# Patient Record
Sex: Male | Born: 1945 | Race: White | Hispanic: No | Marital: Married | Smoking: Former smoker
Health system: Southern US, Community
[De-identification: ages and names within clinical notes are randomized; demographics above are authoritative.]

## PROBLEM LIST (undated history)

## (undated) DIAGNOSIS — G4733 Obstructive sleep apnea (adult) (pediatric): Secondary | ICD-10-CM

## (undated) DIAGNOSIS — I4729 Other ventricular tachycardia: Secondary | ICD-10-CM

## (undated) DIAGNOSIS — I639 Cerebral infarction, unspecified: Secondary | ICD-10-CM

## (undated) DIAGNOSIS — L409 Psoriasis, unspecified: Secondary | ICD-10-CM

## (undated) DIAGNOSIS — Z7901 Long term (current) use of anticoagulants: Secondary | ICD-10-CM

## (undated) DIAGNOSIS — F32A Depression, unspecified: Secondary | ICD-10-CM

## (undated) DIAGNOSIS — I5030 Unspecified diastolic (congestive) heart failure: Secondary | ICD-10-CM

## (undated) DIAGNOSIS — G2581 Restless legs syndrome: Secondary | ICD-10-CM

## (undated) DIAGNOSIS — Z9889 Other specified postprocedural states: Secondary | ICD-10-CM

## (undated) DIAGNOSIS — F4310 Post-traumatic stress disorder, unspecified: Secondary | ICD-10-CM

## (undated) DIAGNOSIS — R112 Nausea with vomiting, unspecified: Secondary | ICD-10-CM

## (undated) DIAGNOSIS — I472 Ventricular tachycardia, unspecified: Secondary | ICD-10-CM

## (undated) DIAGNOSIS — L4050 Arthropathic psoriasis, unspecified: Secondary | ICD-10-CM

## (undated) DIAGNOSIS — F419 Anxiety disorder, unspecified: Secondary | ICD-10-CM

## (undated) DIAGNOSIS — I2510 Atherosclerotic heart disease of native coronary artery without angina pectoris: Secondary | ICD-10-CM

## (undated) DIAGNOSIS — F329 Major depressive disorder, single episode, unspecified: Secondary | ICD-10-CM

## (undated) DIAGNOSIS — I10 Essential (primary) hypertension: Secondary | ICD-10-CM

## (undated) DIAGNOSIS — Z87442 Personal history of urinary calculi: Secondary | ICD-10-CM

## (undated) DIAGNOSIS — E785 Hyperlipidemia, unspecified: Secondary | ICD-10-CM

## (undated) DIAGNOSIS — K219 Gastro-esophageal reflux disease without esophagitis: Secondary | ICD-10-CM

## (undated) DIAGNOSIS — M1990 Unspecified osteoarthritis, unspecified site: Secondary | ICD-10-CM

## (undated) DIAGNOSIS — J309 Allergic rhinitis, unspecified: Secondary | ICD-10-CM

## (undated) DIAGNOSIS — E119 Type 2 diabetes mellitus without complications: Secondary | ICD-10-CM

## (undated) DIAGNOSIS — I4819 Other persistent atrial fibrillation: Secondary | ICD-10-CM

## (undated) DIAGNOSIS — R42 Dizziness and giddiness: Secondary | ICD-10-CM

## (undated) HISTORY — DX: Dizziness and giddiness: R42

## (undated) HISTORY — PX: CARDIAC ELECTROPHYSIOLOGY STUDY AND ABLATION: SHX1294

## (undated) HISTORY — DX: Cerebral infarction, unspecified: I63.9

## (undated) HISTORY — DX: Other specified postprocedural states: Z98.890

## (undated) HISTORY — PX: OTHER SURGICAL HISTORY: SHX169

## (undated) HISTORY — DX: Allergic rhinitis, unspecified: J30.9

## (undated) HISTORY — PX: TRANSTHORACIC ECHOCARDIOGRAM: SHX275

## (undated) HISTORY — PX: CARDIAC CATHETERIZATION: SHX172

## (undated) SURGERY — Surgical Case
Anesthesia: *Unknown

---

## 2003-04-03 ENCOUNTER — Emergency Department (HOSPITAL_COMMUNITY): Admission: AC | Admit: 2003-04-03 | Discharge: 2003-04-03 | Payer: Self-pay

## 2003-11-07 ENCOUNTER — Inpatient Hospital Stay (HOSPITAL_COMMUNITY): Admission: EM | Admit: 2003-11-07 | Discharge: 2003-11-08 | Payer: Self-pay | Admitting: Emergency Medicine

## 2004-03-28 ENCOUNTER — Ambulatory Visit: Payer: Self-pay | Admitting: Family Medicine

## 2004-03-30 ENCOUNTER — Ambulatory Visit: Payer: Self-pay | Admitting: Family Medicine

## 2004-04-25 ENCOUNTER — Ambulatory Visit: Payer: Self-pay | Admitting: Family Medicine

## 2004-06-26 ENCOUNTER — Ambulatory Visit: Payer: Self-pay | Admitting: Family Medicine

## 2004-06-28 ENCOUNTER — Ambulatory Visit: Payer: Self-pay | Admitting: Family Medicine

## 2004-09-07 ENCOUNTER — Ambulatory Visit: Payer: Self-pay | Admitting: Family Medicine

## 2004-09-11 ENCOUNTER — Ambulatory Visit: Payer: Self-pay | Admitting: Family Medicine

## 2004-09-12 ENCOUNTER — Ambulatory Visit: Payer: Self-pay

## 2004-09-18 ENCOUNTER — Ambulatory Visit: Payer: Self-pay | Admitting: Family Medicine

## 2004-09-21 ENCOUNTER — Ambulatory Visit: Payer: Self-pay

## 2004-10-06 ENCOUNTER — Ambulatory Visit: Payer: Self-pay | Admitting: Cardiology

## 2004-10-16 ENCOUNTER — Ambulatory Visit: Payer: Self-pay | Admitting: Cardiology

## 2004-10-16 ENCOUNTER — Inpatient Hospital Stay (HOSPITAL_COMMUNITY): Admission: AD | Admit: 2004-10-16 | Discharge: 2004-10-18 | Payer: Self-pay | Admitting: Cardiology

## 2004-11-21 ENCOUNTER — Ambulatory Visit: Payer: Self-pay | Admitting: Internal Medicine

## 2004-12-15 ENCOUNTER — Ambulatory Visit: Payer: Self-pay | Admitting: Family Medicine

## 2004-12-20 ENCOUNTER — Ambulatory Visit: Payer: Self-pay | Admitting: Family Medicine

## 2005-02-02 ENCOUNTER — Ambulatory Visit: Payer: Self-pay | Admitting: Family Medicine

## 2005-03-14 ENCOUNTER — Ambulatory Visit: Payer: Self-pay | Admitting: Family Medicine

## 2005-03-20 ENCOUNTER — Ambulatory Visit: Payer: Self-pay | Admitting: Family Medicine

## 2005-06-07 ENCOUNTER — Ambulatory Visit: Payer: Self-pay | Admitting: Family Medicine

## 2005-06-12 ENCOUNTER — Ambulatory Visit: Payer: Self-pay | Admitting: Family Medicine

## 2005-06-12 ENCOUNTER — Ambulatory Visit: Payer: Self-pay | Admitting: Internal Medicine

## 2005-09-10 ENCOUNTER — Ambulatory Visit: Payer: Self-pay | Admitting: Family Medicine

## 2005-09-12 ENCOUNTER — Ambulatory Visit: Payer: Self-pay | Admitting: Family Medicine

## 2006-03-06 ENCOUNTER — Ambulatory Visit: Payer: Self-pay | Admitting: Family Medicine

## 2006-03-08 ENCOUNTER — Ambulatory Visit: Payer: Self-pay | Admitting: Family Medicine

## 2006-03-20 ENCOUNTER — Ambulatory Visit: Payer: Self-pay | Admitting: Family Medicine

## 2006-04-01 ENCOUNTER — Ambulatory Visit: Payer: Self-pay | Admitting: Family Medicine

## 2006-04-03 ENCOUNTER — Ambulatory Visit: Payer: Self-pay | Admitting: Family Medicine

## 2006-04-05 ENCOUNTER — Ambulatory Visit: Payer: Self-pay | Admitting: Family Medicine

## 2006-04-08 ENCOUNTER — Ambulatory Visit: Payer: Self-pay | Admitting: Family Medicine

## 2006-04-10 ENCOUNTER — Ambulatory Visit: Payer: Self-pay | Admitting: Family Medicine

## 2006-04-12 ENCOUNTER — Ambulatory Visit: Payer: Self-pay | Admitting: Family Medicine

## 2006-07-08 ENCOUNTER — Ambulatory Visit: Payer: Self-pay | Admitting: Internal Medicine

## 2006-07-15 ENCOUNTER — Ambulatory Visit: Payer: Self-pay

## 2006-09-09 ENCOUNTER — Encounter (INDEPENDENT_AMBULATORY_CARE_PROVIDER_SITE_OTHER): Payer: Self-pay | Admitting: *Deleted

## 2007-03-21 ENCOUNTER — Ambulatory Visit: Payer: Self-pay | Admitting: Family Medicine

## 2008-02-19 ENCOUNTER — Ambulatory Visit: Payer: Self-pay | Admitting: Internal Medicine

## 2008-11-16 ENCOUNTER — Encounter: Payer: Self-pay | Admitting: Family Medicine

## 2008-11-16 LAB — HM DIABETES EYE EXAM: HM Diabetic Eye Exam: NORMAL

## 2009-03-09 ENCOUNTER — Telehealth: Payer: Self-pay | Admitting: Internal Medicine

## 2009-04-11 ENCOUNTER — Ambulatory Visit: Payer: Self-pay | Admitting: Internal Medicine

## 2009-04-11 DIAGNOSIS — I4892 Unspecified atrial flutter: Secondary | ICD-10-CM | POA: Insufficient documentation

## 2009-04-11 DIAGNOSIS — I472 Ventricular tachycardia: Secondary | ICD-10-CM | POA: Insufficient documentation

## 2009-04-11 DIAGNOSIS — I10 Essential (primary) hypertension: Secondary | ICD-10-CM | POA: Insufficient documentation

## 2010-05-02 NOTE — Assessment & Plan Note (Signed)
Summary: F1Y/AMD      Allergies Added: NKDA  Visit Type:  Follow-up  CC:  no complaints.  History of Present Illness: [name] returns today for followup.  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  He has morbid obesity and has lost 10 lbs. in the past year.  The patient denies c/p, sob, palpitations or syncope.  Problems Prior to Update: 1)  Hypertension, Benign  (ICD-401.1)  Current Problems (verified): 1)  Hypertension, Benign  (ICD-401.1)  Current Medications (verified): 1)  Prozac 20 Mg  Caps (Fluoxetine Hcl) .... Take 1 Capsule By Mouth Once A Day 2)  Metoprolol Tartrate 50 Mg Tabs (Metoprolol Tartrate) .... Take One Tablet By Mouth Twice A Day 3)  Prilosec 20 Mg  Cpdr (Omeprazole) .... Take 1 Tablet By Mouth Every Morning 4)  Zyrtec Allergy 10 Mg  Tabs (Cetirizine Hcl) .... Take 1 Tab By Mouth At Bedtime 5)  Azithromycin 250 Mg  Tabs (Azithromycin) .... 2 Tabs By Mouth X 1day, Then1 Tab Daily 6)  Cheratussin Ac 100-10 Mg/26ml  Syrp (Guaifenesin-Codeine) .Marland Kitchen.. 1-2 Tsp By Mouth Q Hs As Needed Cough  Allergies (verified): No Known Drug Allergies  Review of Systems  The patient denies chest pain, syncope, dyspnea on exertion, and peripheral edema.    Vital Signs:  Patient profile:   65 year old male Weight:      274.75 pounds Pulse rate:   58 / minute Pulse rhythm:   regular BP sitting:   138 / 80  (left arm) Cuff size:   large  Vitals Entered By: Mercer Pod (April 11, 2009 10:43 AM)  Physical Exam  General:  Obese, well developed, well nourished, in no acute distress.  HEENT: normal Neck: supple. No JVD. Carotids 2+ bilaterally no bruits Cor: IRIRR no rubs, gallops or murmur Lungs: CTA Ab: soft, nontender. nondistended. No HSM. Good bowel sounds Ext: warm. no cyanosis, clubbing or edema Neuro: alert and oriented. Grossly nonfocal. affect pleasant  Neck:  N   EKG  Procedure date:   04/11/2009  Findings:      Atrial flutter with a ventricular response rate of:  58.  Right axis deviation.    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 Eveleth and is asymptomatic.  In addition to his beta blocker, I have recommended ASA 325  mg daily as he has borderline HTN as his only CHADS risk factor. His updated medication list for this problem includes:    Metoprolol Tartrate 50 Mg Tabs (Metoprolol tartrate) .Marland Kitchen... 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 His updated medication list for this problem includes:    Metoprolol Tartrate 50 Mg Tabs (Metoprolol tartrate) .Marland Kitchen... Take one tablet by mouth twice a day  Problem # 3:  HYPERTENSION, BENIGN (ICD-401.1) The patient states that he does not have hypertension though his blood pressure is elevated today.  I have asked that he continue his current meds, maintain a low sodium diet and lose weight. His updated medication list for this problem includes:    Metoprolol Tartrate 50 Mg Tabs (Metoprolol tartrate) .Marland Kitchen... Take one tablet by mouth twice a day Prescriptions: METOPROLOL TARTRATE 50 MG TABS (METOPROLOL TARTRATE) Take one tablet by mouth twice a day  #180  x 3   Entered by:   Charlena Cross, RN, BSN   Authorized by:   Laren Boom, MD, [facility]   Signed by:   Charlena Cross, RN, BSN on 04/11/2009   Method used:   Electronically to        [facility] (retail)       [address], [address]       [place], [place]  [address]       Ph: [phone]       Fax: [phone]   RxID:   [id number]

## 2010-07-21 ENCOUNTER — Ambulatory Visit (INDEPENDENT_AMBULATORY_CARE_PROVIDER_SITE_OTHER): Payer: PRIVATE HEALTH INSURANCE | Admitting: Internal Medicine

## 2010-07-21 ENCOUNTER — Encounter: Payer: Self-pay | Admitting: Internal Medicine

## 2010-07-21 DIAGNOSIS — I472 Ventricular tachycardia: Secondary | ICD-10-CM

## 2010-07-21 DIAGNOSIS — I4892 Unspecified atrial flutter: Secondary | ICD-10-CM

## 2010-07-21 DIAGNOSIS — E669 Obesity, unspecified: Secondary | ICD-10-CM

## 2010-07-21 DIAGNOSIS — I10 Essential (primary) hypertension: Secondary | ICD-10-CM

## 2010-07-21 NOTE — Assessment & Plan Note (Signed)
His blood pressure is well controlled. Will follow. He has been instructed to lose weight.

## 2010-07-21 NOTE — Assessment & Plan Note (Signed)
He remains asymptomatic and will continue his beta blocker.

## 2010-07-21 NOTE — Assessment & Plan Note (Signed)
He is asymptomatic and his [place] score is one. He will continue his current meds.

## 2010-07-21 NOTE — Assessment & Plan Note (Signed)
I have continued to stress the importance of weight loss. He understands and will try to improve.

## 2010-07-21 NOTE — Patient Instructions (Signed)
No change in your medications today. Your physician recommends that you schedule a follow-up appointment in: 1 year

## 2010-07-21 NOTE — Progress Notes (Signed)
HPI [name] returns today for followup. He is a pleasant 65 yo man with a h/o RVOT VT which has been asymptomatic. He has a h/o atrial fib/flutter and has been on a strategy of rate control as he is asymptomatic. He continues to have no symptoms except for minimal dyspnea when he is walking up a grade. No c/p or peripheral edema. He continues to have trouble with weight loss and has gained over 5 lbs since we last saw him a year ago. No syncope. No Known Allergies   Current Outpatient Prescriptions  Medication Sig Dispense Refill  . aspirin EC 325 MG EC tablet Take 325 mg by mouth daily.        Marland Kitchen FLUoxetine (PROZAC) 20 MG tablet Take 20 mg by mouth daily.        Marland Kitchen loratadine (CLARITIN) 10 MG tablet Take 10 mg by mouth daily.        . metoprolol (LOPRESSOR) 50 MG tablet Take 25 mg by mouth 2 (two) times daily.       . Multiple Vitamins-Minerals (PRESERVISION AREDS 2) CAPS Take 2 capsules by mouth daily.        Marland Kitchen omeprazole (PRILOSEC) 20 MG capsule Take 20 mg by mouth daily.        Marland Kitchen azithromycin (ZITHROMAX) 250 MG tablet Take 2 tablets by mouth on day 1, followed by 1 tablet by mouth daily for 4 days.        . cetirizine (ZYRTEC) 10 MG tablet Take 10 mg by mouth daily.        Marland Kitchen guaiFENesin-codeine (ROBITUSSIN AC) 100-10 MG/5ML syrup Take 5 mLs by mouth 3 (three) times daily as needed.           No past medical history on file.  ROS:   All systems reviewed and negative except as noted in the HPI.   No past surgical history on file.   No family history on file.   History   Social History  . Marital Status: Married    Spouse Name: N/A    Number of Children: N/A  . Years of Education: N/A   Occupational History  . Not on file.   Social History Main Topics  . Smoking status: Never Smoker   . Smokeless tobacco: Never Used  . Alcohol Use: Yes     occasional  . Drug Use: No  . Sexually Active: Not on file   Other Topics Concern  . Not on file   Social History Narrative    . No narrative on file     BP 126/78  Pulse 66  Ht 5' 9.5" (1.765 m)  Wt 281 lb 1.9 oz (127.515 kg)  BMI 40.92 kg/m2  Physical Exam:  Obese, Well appearing NAD HEENT: Unremarkable Neck:  No JVD, no thyromegally Lymphatics:  No adenopathy Back:  No CVA tenderness Lungs:  Clear HEART: IRegular rate rhythm, no murmurs, no rubs, no clicks Abd:  Flat, positive bowel sounds, no organomegally, no rebound, no guarding Ext:  2 plus pulses, no edema, no cyanosis, no clubbing Skin:  No rashes no nodules Neuro:  CN II through XII intact, motor grossly intact  EKG Atrial fib/flutter with a controlled ventricular response.  Assess/Plan:

## 2010-08-15 NOTE — Assessment & Plan Note (Signed)
[facility]                         ELECTROPHYSIOLOGY OFFICE NOTE   MATAI, CARPENITO                        MRN:          [id number]  DATE:02/19/2008                            DOB:          11/22/1945    Mr. Czerniak returns today for followup.  He is a very pleasant middle-aged  male with a RV outflow tract VT and palpitations from this, whose  symptoms have been well controlled over the last year.  He denies chest  pain.  He denies shortness of breath.  He had no specific complaints  today.  He is interested in trying to decrease his dose of his beta-  blocker.   CURRENT MEDICATIONS:  1. Toprol-XL 50 twice a day.  2. Prozac 40 a day.  3. Prilosec 20 a day.  4. Zyrtec 10 a day.   PHYSICAL EXAMINATION:  GENERAL:  He is a pleasant middle-aged man in no  distress.  VITAL SIGNS:  Blood pressure 129/83, the pulse 62 and regular,  respirations were 18, the weight was 280 pounds.  NECK:  Revealed no jugular venous distention.  LUNGS:  Clear bilaterally to auscultation.  No wheezes, rales, or  rhonchi are present.  No increased work of breathing.  CARDIOVASCULAR:  Regular rate and rhythm.  Normal S1 and S2.  EXTREMITIES:  Demonstrate no edema.   EKG demonstrates sinus rhythm with normal axis and intervals.  He does  have first-degree AV block.   IMPRESSION:  1. Right ventricular outflow tract ventricular tachycardia.  2. Hypertension.   DISCUSSION:  Mr. Chilson is stable.  I have asked that he decrease his  dose of the long-acting Toprol to 25 mg twice a day as he would like to  stop the medicine altogether, but I am concerned about recurrent  tachycardia.  I want to see him back in a year, if he is stable and  symptom free, then we will decrease his dose of beta-blocker further as  his blood pressure allows.     Doylene Canning. Ladona Ridgel, MD  Electronically Signed    GWT/MedQ  DD: 02/19/2008  DT: 02/20/2008  Job #: [id number]   cc:   Arta Silence, MD

## 2010-08-18 NOTE — Assessment & Plan Note (Signed)
[facility]                         ELECTROPHYSIOLOGY OFFICE NOTE   [name], BABY                        MRN:          [id number]  DATE:07/08/2006                            DOB:          1946-03-03    [name] returns today for follow-up.  He is a very pleasant middle-  aged male with a history of RV outflow tract VT and nonobstructive  coronary artery disease, obesity, and borderline hypertension.  He  returns today for follow-up.  Overall, in the last year he has been  stable except for complaints of intermittent chest tightness, which has  been present for the last couple of months.  Sometimes this is  exertionally-related, sometimes not.  He denies any relationship to food  intake.  He has occasional dizzy spells, which we think is probably his  nonsustained VT, though he has denied palpitations.   PHYSICAL EXAMINATION:  GENERAL:  He is a pleasant, well-appearing, obese  man in no distress.  VITAL SIGNS:  Blood pressure 114/80, the pulse 55 and regular,  respirations 18.  The weight was 280 pounds.  NECK:  No jugular venous distention.  LUNGS:  Clear bilaterally to auscultation.  No wheezes, rales or  rhonchi.  CARDIOVASCULAR:  A regular rate and rhythm with normal S1 and S2.  The  heart sounds were distant.  EXTREMITIES:  No cyanosis, clubbing or edema.   The EKG demonstrates sinus bradycardia with first degree AV block and  left axis deviation.   IMPRESSION:  1. Nonobstructive coronary artery disease.  2. Chest pressure, perhaps worrisome for worsening coronary artery      disease.  3. History of right ventricular outflow tract ventricular tachycardia,      now controlled on beta blocker therapy   DISCUSSION:  Overall, [name] is fairly stable though I am a little  concerned about his chest discomfort, and I have recommended that he  undergo exercise treadmill testing for additional evaluation.  He will  continue on his beta  blocker, holding it the day before the procedure.     Doylene Canning. Ladona Ridgel, MD  Electronically Signed    GWT/MedQ  DD: 07/08/2006  DT: 07/09/2006  Job #: [id number]   cc:   Arta Silence, MD

## 2010-08-18 NOTE — Cardiovascular Report (Signed)
NAMEDRAE, MITZEL NO.:  [id number]   MEDICAL RECORD NO.:  [id number]          PATIENT TYPE:  INP   LOCATION:  6527                         FACILITY:  [facility]   PHYSICIAN:  Arvilla Meres, M.D. LHCDATE OF BIRTH:  10/24/1945   DATE OF PROCEDURE:  10/17/2004  DATE OF DISCHARGE:                              CARDIAC CATHETERIZATION   PRIMARY CARE PHYSICIAN:  Dr. Laurita Spence at [facility].   CARDIOLOGIST:  Dr. Angelina Spence.   PATIENT IDENTIFICATION:  [name] is a very pleasant 65 year old male with  a history of borderline diabetes who was evaluated for syncope.  Event  monitor showed nonsustained V-tach and he was referred for cardiac  catheterization to rule out underlying coronary artery disease.   PROCEDURES PERFORMED:  1.  Selective coronary angiography.  2.  Left heart catheterization.  3.  Left ventriculogram.   DESCRIPTION OF PROCEDURE:  The risks and benefits of catheterization were  explained to [name]; consent was signed and placed on the chart.  A 6-  [place] arterial sheath was placed in the right femoral artery using an  anterior puncture and a modified Seldinger technique.  Standard catheters  including a JL4, JR4 and an angled pigtail were used for the procedure and  all catheter exchanges were made over wire; there are no apparent  complications.  At the end of the procedure, the patient was transferred to  holding area in stable condition for removal of his sheath.   FINDINGS:  Central aortic pressure is 135/90 with a mean 110.  LV pressure  was 136/90 with an EDP of 19.  There was no gradient on aortic valve  pullback.   CORONARY ANATOMY:  Left main was short.  There was no angiographic CAD.   LAD was a long vessel that wrapped the apex.  It gave off a very large first  diagonal and a small second diagonal.  There was a tubular 40% to 50%  stenosis in the mid LAD after the first diagonal.  This did not appear to be  flow-limiting.   Left circumflex was a large codominant vessel.  It gave off a large OM-1 a  large OM-2.  The A-V groove circumflex was large and gave off a normal-size  PDA.  There was no angiographic CAD.   Right coronary artery was a large codominant vessel.  It gave off a normal-  sized PDA.  There was a 30% lesion in the distal RCA just prior to the  takeoff of the PDA.   Left ventriculogram done in the RAO approach showed an ejection fraction of  65% with no evidence of wall motion abnormalities or mitral regurgitation.  On panning down over the aorta after the ventriculogram, there was no  evidence of aneurysm dilatation or significant atherosclerotic plaquing.   ASSESSMENT:  1.  Nonobstructive coronary artery disease.  2.  Normal left ventricular function.   PLAN:  Will be to proceed with electrophysiology testing to further evaluate  his ventricular tachycardia.  He will also need risk factor modification,  given his underlying coronary disease and  diabetes.       DB/MEDQ  D:  10/17/2004  T:  10/17/2004  Job:  [id number]   cc:   [name], M.D.  [address] [place]  [place] [address]  Fax: [phone]

## 2010-08-18 NOTE — Discharge Summary (Signed)
NAMEALVIA, Adam NO.:  [id number]   MEDICAL RECORD NO.:  [id number]          PATIENT TYPE:  INP   LOCATION:  6532                         FACILITY:  [facility]   PHYSICIAN:  Maple Mirza, P.A. DATE OF BIRTH:  10/24/1945   DATE OF ADMISSION:  10/16/2004  DATE OF DISCHARGE:                                 DISCHARGE SUMMARY   ADDENDUM TO DICTATION [id number].   LABORATORY DATA:  October 18, 2004:  Fasting serum electrolytes:  Sodium 137,  potassium 3.8, chloride 107, carbonate 24, glucose is 114, BUN is 8,  creatinine 0.8.  His prior serum electrolytes were done at about 9:30 in the  evening on the day of admission.  They were a sodium of 139, potassium 3.5,  chloride of 108, carbonate 25, BUN is 12, creatinine 1, and glucose 169.  His 114 as a fasting July 19th shows a mild glucose intolerance.  This has  been communicated to the patient and he knows he is a borderline diabetic.  He also had a complete blood count July 19th and white cells were 7.7,  hemoglobin 13.1, hematocrit 36.8, platelets 276.  I would also like to say  that at the time of his catheterization on the post-cath orders, he was  started on Lipitor 40 mg daily at bedtime.  When this was brought up to the  patient, the patient queried this since his cholesterol values are such as  follows:  Cholesterol 152, triglycerides 188, HDL cholesterol 25, LDL  cholesterol 89.  I did mention to him that cholesterol has pleiotropic  effects such as stabilizing plaques, there is some plaque formation in his  coronary arteries found on catheterization, as well as control of  cholesterol.  His major aberrant number here is the HDL cholesterol of 25  for which a statin will not be the most effective medication.  [name]  promised to bring up dosing of statin when he sees Dr. Ladona Ridgel in two weeks  and he will see Dr. Ladona Ridgel on August 22nd at 9:45.       GM/MEDQ  D:  10/18/2004  T:  10/18/2004  Job:  [id number]   cc:   Doylene Canning. Ladona Ridgel, M.D.   Laurita Quint, M.D.  [address] [place]  [place] [address]  Fax: [phone]
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
## 2010-08-18 NOTE — H&P (Signed)
[name], PUSKAS NO.:  [id number]   MEDICAL RECORD NO.:  [id number]          PATIENT TYPE:  INP   LOCATION:  6527                         FACILITY:  [facility]   PHYSICIAN:  Learta Codding, M.D. LHCDATE OF BIRTH:  10/24/1945   DATE OF ADMISSION:  10/16/2004  DATE OF DISCHARGE:                                HISTORY & PHYSICAL   ADDENDUM:  The patient is a 65 year old male who was initially evaluated by Dr.  Antoine Poche on October 06, 2004, for syncope.  An event monitor was placed and the  patient was called in today after he was noted on October 12, 2004, to have an  episode of non-sustained ventricular tachycardia lasting approximately 20  seconds.  Review of the event monitor revealed that the heart rate  associated with ventricular tachycardia was around 250 beats per minute.  The patient did report marked dizziness at that time but no chest pain or  diaphoresis.  He states that he had several more episodes since he saw Dr.  Antoine Poche in the office.  He was called in from our office to be admitted for  further evaluation.  The patient has stable vital signs.  No chest pain,  shortness of breath, or diaphoresis.   The full H&P was dictated on October 06, 2004.  The only addendum to this is  that the patient's family history is notable for the fact there is a  significant history of sudden cardiac death in his family.  He stated his  father died of sudden cardiac death at the age of 44, although this could  have well been a myocardial infarction; however, the mother on the father's  side had a sudden cardiac death at age 72.  He also had an uncle on his  father's side who had sudden cardiac death at age 24.   A 12 lead electrocardiogram will be obtained and placed on the chart.  The  patient will be started on aspirin and a beta blocker in preparation for his  cardiac catheterization in addition tomorrow.  Dr. Antoine Poche apparently has  already notified the patient and  discussed the procedure.  Also, Dr. Ladona Ridgel  will see the patient after his cardiac catheterization pending an underlying  diagnosis of ischemic heart disease.   CURRENT PROBLEM LIST:  1.  Non-sustained ventricular tachycardia with syncope and dizziness.      1.  Rule out coronary artery disease/ischemic heart disease.      2.  Rule out idiopathic VT.      3.  Family history of sudden cardiac death.  2.  Hypertension and left ventricular hypertrophy by echocardiogram.  3.  Obesity.   PLAN:  1.  As outlined above, cardiac catheterization in the a.m., rule out      ischemic heart disease.  2.  EPS evaluation after cardiac catheterization with Dr. Ladona Ridgel.  3.  Aspirin and beta blocker tonight.  4.  Evaluate TSH.  5.  Lipid panel.  6.  12 lead electrocardiogram.       GED/MEDQ  D:  10/16/2004  T:  10/16/2004  Job:  [id number]   cc:   Rollene Rotunda, M.D.  [address]  [address]  [place]  [place] [address]   Laurita Quint, M.D.  [address] [place]  [place] [address]  Fax: [phone]

## 2010-08-18 NOTE — Assessment & Plan Note (Signed)
[facility]                                 ON-CALL NOTE   [name], [name]                          MRN:          [id number]  DATE:01/23/2008                            DOB:          [birth year]    PRIMARY CARDIOLOGIST:  Doylene Canning. Ladona Ridgel, MD   [name] is a 65 year old male with a history of atrial fibrillation.  He was last seen by Dr. Ladona Ridgel in April 2008.  He called requesting a  refill on his prescription.  His wife stated that he has an appointment  with Dr. Ladona Ridgel on February 17, 2008, which she promises me he will  keep.  She is requesting a refill on his metoprolol to get him through  to February 19, 2008.   I reiterated to her that we would not refill this medication again.  She  stated he would keep his appointment in November.  I called in a month's  worth of his metoprolol 50 mg b.i.d. to the [facility] phone [phone] and  also told them that we will not refill this medication without an office  visit.      Theodore Demark, PA-C  Electronically Signed      Jesse Sans. Daleen Squibb, MD, [facility]  Electronically Signed   RB/MedQ  DD: 01/23/2008  DT: 01/24/2008  Job #: [id number]
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
## 2010-09-07 ENCOUNTER — Other Ambulatory Visit: Payer: Self-pay | Admitting: Internal Medicine

## 2011-04-03 HISTORY — PX: CATARACT EXTRACTION W/ INTRAOCULAR LENS  IMPLANT, BILATERAL: SHX1307

## 2011-06-01 DIAGNOSIS — I5030 Unspecified diastolic (congestive) heart failure: Secondary | ICD-10-CM

## 2011-06-01 HISTORY — DX: Unspecified diastolic (congestive) heart failure: I50.30

## 2011-06-07 ENCOUNTER — Ambulatory Visit: Payer: Self-pay | Admitting: Internal Medicine

## 2011-06-08 ENCOUNTER — Other Ambulatory Visit: Payer: Self-pay

## 2011-06-08 ENCOUNTER — Encounter (HOSPITAL_COMMUNITY): Payer: Self-pay | Admitting: Emergency Medicine

## 2011-06-08 ENCOUNTER — Emergency Department (HOSPITAL_COMMUNITY): Payer: PRIVATE HEALTH INSURANCE

## 2011-06-08 ENCOUNTER — Inpatient Hospital Stay (HOSPITAL_COMMUNITY)
Admission: EM | Admit: 2011-06-08 | Discharge: 2011-06-12 | DRG: 292 | Disposition: A | Discharge status: Home / Self Care | Payer: PRIVATE HEALTH INSURANCE | Attending: Cardiovascular Disease | Admitting: Cardiovascular Disease

## 2011-06-08 DIAGNOSIS — I4891 Unspecified atrial fibrillation: Secondary | ICD-10-CM

## 2011-06-08 DIAGNOSIS — I2510 Atherosclerotic heart disease of native coronary artery without angina pectoris: Secondary | ICD-10-CM | POA: Diagnosis present

## 2011-06-08 DIAGNOSIS — I472 Ventricular tachycardia, unspecified: Secondary | ICD-10-CM | POA: Insufficient documentation

## 2011-06-08 DIAGNOSIS — R0609 Other forms of dyspnea: Secondary | ICD-10-CM | POA: Diagnosis present

## 2011-06-08 DIAGNOSIS — R079 Chest pain, unspecified: Secondary | ICD-10-CM

## 2011-06-08 DIAGNOSIS — I4892 Unspecified atrial flutter: Secondary | ICD-10-CM | POA: Diagnosis present

## 2011-06-08 DIAGNOSIS — I5031 Acute diastolic (congestive) heart failure: Principal | ICD-10-CM | POA: Diagnosis present

## 2011-06-08 DIAGNOSIS — I10 Essential (primary) hypertension: Secondary | ICD-10-CM | POA: Insufficient documentation

## 2011-06-08 DIAGNOSIS — I498 Other specified cardiac arrhythmias: Secondary | ICD-10-CM | POA: Diagnosis present

## 2011-06-08 DIAGNOSIS — Z79899 Other long term (current) drug therapy: Secondary | ICD-10-CM

## 2011-06-08 DIAGNOSIS — I5032 Chronic diastolic (congestive) heart failure: Secondary | ICD-10-CM | POA: Insufficient documentation

## 2011-06-08 DIAGNOSIS — D649 Anemia, unspecified: Secondary | ICD-10-CM | POA: Diagnosis present

## 2011-06-08 DIAGNOSIS — I480 Paroxysmal atrial fibrillation: Secondary | ICD-10-CM | POA: Insufficient documentation

## 2011-06-08 DIAGNOSIS — R0989 Other specified symptoms and signs involving the circulatory and respiratory systems: Secondary | ICD-10-CM | POA: Diagnosis present

## 2011-06-08 DIAGNOSIS — Z7982 Long term (current) use of aspirin: Secondary | ICD-10-CM

## 2011-06-08 DIAGNOSIS — Z6841 Body Mass Index (BMI) 40.0 and over, adult: Secondary | ICD-10-CM

## 2011-06-08 DIAGNOSIS — E876 Hypokalemia: Secondary | ICD-10-CM | POA: Diagnosis present

## 2011-06-08 DIAGNOSIS — I499 Cardiac arrhythmia, unspecified: Secondary | ICD-10-CM | POA: Insufficient documentation

## 2011-06-08 DIAGNOSIS — I509 Heart failure, unspecified: Secondary | ICD-10-CM | POA: Diagnosis present

## 2011-06-08 DIAGNOSIS — R0602 Shortness of breath: Secondary | ICD-10-CM

## 2011-06-08 HISTORY — DX: Atherosclerotic heart disease of native coronary artery without angina pectoris: I25.10

## 2011-06-08 HISTORY — DX: Ventricular tachycardia: I47.2

## 2011-06-08 HISTORY — DX: Unspecified diastolic (congestive) heart failure: I50.30

## 2011-06-08 HISTORY — DX: Essential (primary) hypertension: I10

## 2011-06-08 HISTORY — DX: Gastro-esophageal reflux disease without esophagitis: K21.9

## 2011-06-08 HISTORY — DX: Ventricular tachycardia, unspecified: I47.20

## 2011-06-08 HISTORY — DX: Other ventricular tachycardia: I47.29

## 2011-06-08 LAB — PROTIME-INR
INR: 1.33 (ref 0.00–1.49)
Prothrombin Time: 16.7 seconds — ABNORMAL HIGH (ref 11.6–15.2)

## 2011-06-08 LAB — CBC
HCT: 34.1 % — ABNORMAL LOW (ref 39.0–52.0)
Hemoglobin: 11.4 g/dL — ABNORMAL LOW (ref 13.0–17.0)
MCH: 28.8 pg (ref 26.0–34.0)
MCHC: 33.4 g/dL (ref 30.0–36.0)
MCV: 86.1 fL (ref 78.0–100.0)
Platelets: 239 10*3/uL (ref 150–400)
RBC: 3.96 MIL/uL — ABNORMAL LOW (ref 4.22–5.81)
RDW: 13.9 % (ref 11.5–15.5)
WBC: 6.6 10*3/uL (ref 4.0–10.5)

## 2011-06-08 LAB — BASIC METABOLIC PANEL
BUN: 9 mg/dL (ref 6–23)
CO2: 25 mEq/L (ref 19–32)
Calcium: 8.6 mg/dL (ref 8.4–10.5)
Chloride: 103 mEq/L (ref 96–112)
Creatinine, Ser: 0.64 mg/dL (ref 0.50–1.35)
GFR calc Af Amer: >90 mL/min (ref >90)
GFR calc non Af Amer: >90 mL/min (ref >90)
Glucose, Bld: 175 mg/dL — ABNORMAL HIGH (ref 70–99)
Potassium: 3.4 mEq/L — ABNORMAL LOW (ref 3.5–5.1)
Sodium: 140 mEq/L (ref 135–145)

## 2011-06-08 LAB — CARDIAC PANEL(CRET KIN+CKTOT+MB+TROPI)
CK, MB: 5.9 ng/mL — ABNORMAL HIGH (ref 0.3–4.0)
CK, MB: 5.1 ng/mL — ABNORMAL HIGH (ref 0.3–4.0)
Relative Index: 2.7 — ABNORMAL HIGH (ref 0.0–2.5)
Relative Index: 2.3 (ref 0.0–2.5)
Total CK: 216 U/L (ref 7–232)
Total CK: 220 U/L (ref 7–232)
Troponin I: <0.3 ng/mL (ref <0.30)
Troponin I: <0.3 ng/mL (ref <0.30)

## 2011-06-08 LAB — PRO B NATRIURETIC PEPTIDE: Pro B Natriuretic peptide (BNP): 789.9 pg/mL — ABNORMAL HIGH (ref 0–125)

## 2011-06-08 LAB — HEMOGLOBIN A1C: Hgb A1c MFr Bld: 7 % — ABNORMAL HIGH (ref <5.7)

## 2011-06-08 LAB — TSH: TSH: 1.944 u[IU]/mL (ref 0.350–4.500)

## 2011-06-08 LAB — MAGNESIUM: Magnesium: 1.6 mg/dL (ref 1.5–2.5)

## 2011-06-08 MED ORDER — POTASSIUM CHLORIDE CRYS ER 20 MEQ PO TBCR
20.0000 meq | EXTENDED_RELEASE_TABLET | Freq: Two times a day (BID) | ORAL | Status: DC
  Administered 2011-06-08: 20 meq via ORAL
  Filled 2011-06-08 (×3): qty 1

## 2011-06-08 MED ORDER — SODIUM CHLORIDE 0.9 % IJ SOLN: 3.0000 mL | INTRAMUSCULAR | Status: DC | PRN

## 2011-06-08 MED ORDER — ONDANSETRON HCL 4 MG/2ML IJ SOLN: 4.0000 mg | Freq: Four times a day (QID) | INTRAMUSCULAR | Status: DC | PRN

## 2011-06-08 MED ORDER — SODIUM CHLORIDE 0.9 % IV SOLN: 250.0000 mL | INTRAVENOUS | Status: DC | PRN

## 2011-06-08 MED ORDER — PATIENT'S GUIDE TO USING COUMADIN BOOK
Freq: Once | Status: AC
  Administered 2011-06-08: 15:00:00
  Filled 2011-06-08: qty 1

## 2011-06-08 MED ORDER — FUROSEMIDE 10 MG/ML IJ SOLN
40.0000 mg | Freq: Once | INTRAMUSCULAR | Status: AC
  Administered 2011-06-08: 40 mg via INTRAVENOUS
  Filled 2011-06-08: qty 4

## 2011-06-08 MED ORDER — FUROSEMIDE 10 MG/ML IJ SOLN
80.0000 mg | Freq: Two times a day (BID) | INTRAMUSCULAR | Status: DC
  Administered 2011-06-08 – 2011-06-12 (×8): 80 mg via INTRAVENOUS
  Filled 2011-06-08 (×10): qty 8

## 2011-06-08 MED ORDER — METOPROLOL TARTRATE 25 MG PO TABS
25.0000 mg | ORAL_TABLET | Freq: Two times a day (BID) | ORAL | Status: DC
  Administered 2011-06-08 – 2011-06-12 (×8): 25 mg via ORAL
  Filled 2011-06-08 (×9): qty 1

## 2011-06-08 MED ORDER — ENOXAPARIN SODIUM 150 MG/ML ~~LOC~~ SOLN
140.0000 mg | Freq: Two times a day (BID) | SUBCUTANEOUS | Status: DC
  Administered 2011-06-08 – 2011-06-09 (×2): 140 mg via SUBCUTANEOUS
  Filled 2011-06-08 (×4): qty 1

## 2011-06-08 MED ORDER — GUAIFENESIN-CODEINE 100-10 MG/5ML PO SYRP: 5.0000 mL | ORAL_SOLUTION | Freq: Three times a day (TID) | ORAL | Status: DC | PRN

## 2011-06-08 MED ORDER — PANTOPRAZOLE SODIUM 40 MG PO TBEC
40.0000 mg | DELAYED_RELEASE_TABLET | Freq: Every day | ORAL | Status: DC
  Administered 2011-06-09 – 2011-06-12 (×4): 40 mg via ORAL
  Filled 2011-06-08 (×4): qty 1

## 2011-06-08 MED ORDER — FLUOXETINE HCL 20 MG PO CAPS
20.0000 mg | ORAL_CAPSULE | Freq: Every day | ORAL | Status: DC
  Administered 2011-06-09 – 2011-06-12 (×4): 20 mg via ORAL
  Filled 2011-06-08 (×4): qty 1

## 2011-06-08 MED ORDER — NITROGLYCERIN 0.4 MG SL SUBL: 0.4000 mg | SUBLINGUAL_TABLET | SUBLINGUAL | Status: DC | PRN

## 2011-06-08 MED ORDER — HYDROCODONE-ACETAMINOPHEN 5-325 MG PO TABS
1.0000 | ORAL_TABLET | Freq: Four times a day (QID) | ORAL | Status: DC | PRN
  Administered 2011-06-08 – 2011-06-10 (×2): 1 via ORAL
  Filled 2011-06-08 (×2): qty 1

## 2011-06-08 MED ORDER — WARFARIN - PHARMACIST DOSING INPATIENT: Freq: Every day | Status: DC

## 2011-06-08 MED ORDER — FLUOXETINE HCL 20 MG PO TABS: 20.0000 mg | ORAL_TABLET | Freq: Every day | ORAL | Status: DC

## 2011-06-08 MED ORDER — GUAIFENESIN-CODEINE 100-10 MG/5ML PO SOLN: 5.0000 mL | Freq: Three times a day (TID) | ORAL | Status: DC | PRN

## 2011-06-08 MED ORDER — WARFARIN VIDEO: Freq: Once | Status: DC

## 2011-06-08 MED ORDER — ACETAMINOPHEN 325 MG PO TABS: 650.0000 mg | ORAL_TABLET | ORAL | Status: DC | PRN

## 2011-06-08 MED ORDER — ASPIRIN 81 MG PO CHEW
324.0000 mg | CHEWABLE_TABLET | Freq: Once | ORAL | Status: AC
  Administered 2011-06-08: 324 mg via ORAL
  Filled 2011-06-08: qty 4

## 2011-06-08 MED ORDER — WARFARIN SODIUM 7.5 MG PO TABS
7.5000 mg | ORAL_TABLET | Freq: Once | ORAL | Status: AC
  Administered 2011-06-08: 7.5 mg via ORAL
  Filled 2011-06-08: qty 1

## 2011-06-08 MED ORDER — INSULIN ASPART 100 UNIT/ML ~~LOC~~ SOLN
0.0000 [IU] | Freq: Three times a day (TID) | SUBCUTANEOUS | Status: DC
  Filled 2011-06-08: qty 3

## 2011-06-08 MED ORDER — ZOLPIDEM TARTRATE 5 MG PO TABS: 5.0000 mg | ORAL_TABLET | Freq: Every evening | ORAL | Status: DC | PRN

## 2011-06-08 MED ORDER — SODIUM CHLORIDE 0.9 % IJ SOLN
3.0000 mL | Freq: Two times a day (BID) | INTRAMUSCULAR | Status: DC
  Administered 2011-06-08 – 2011-06-12 (×9): 3 mL via INTRAVENOUS
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
## 2011-06-08 NOTE — ED Notes (Signed)
Pt undressed and in gown. Caridac monitor, pulse oximetry, and blood pressure cuff on.

## 2011-06-08 NOTE — ED Notes (Signed)
Pt c/o midsternal chest tightness starting last night with SOB and inability to sleep laying flat x 7 days; pt sts tightness is constant
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
## 2011-06-08 NOTE — ED Provider Notes (Signed)
History     CSN: [id number]  Arrival date & time 06/08/11  5621   First MD Initiated Contact with Patient 06/08/11 [phone]      Chief Complaint  Patient presents with  . Chest Pain  . Shortness of Breath    (Consider location/radiation/quality/duration/timing/severity/associated sxs/prior treatment) HPI Patient presents to the emergency department with chest tightness and shortness of breath started last night.  Patient states that in the last 7 days he said increasing shortness of breath especially with exertion and laying flat.  Patient was seen by his primary doctor yesterday who set up an outpatient echocardiogram for today but since the patient developed chest tightness he felt he needed to be seen in the emergency department.  Patient denies abdominal pain, fever, weakness, or back pain.  States has noticed increased swelling in his lower abdomen scrotum and lower legs.  Patient states he was given 40 of Lasix yesterday and noticed that he had a 2 pound weight loss since that time. Past Medical History  Diagnosis Date  . Arrhythmia   . Obesity     History reviewed. No pertinent past surgical history.  History reviewed. No pertinent family history.  History  Substance Use Topics  . Smoking status: Never Smoker   . Smokeless tobacco: Never Used  . Alcohol Use: Yes     occasional      Review of Systems All pertinent positives and negatives reviewed in the history of present illness  Allergies  Review of patient's allergies indicates no known allergies.  Home Medications   Current Outpatient Rx  Name Route Sig Dispense Refill  . ASPIRIN EC 325 MG PO TBEC Oral Take 325 mg by mouth daily.      Marland Kitchen CETIRIZINE HCL 10 MG PO TABS Oral Take 10 mg by mouth daily.      Marland Kitchen FLUOXETINE HCL 20 MG PO TABS Oral Take 20 mg by mouth daily.      . FUROSEMIDE 40 MG PO TABS Oral Take 40 mg by mouth daily.    . GUAIFENESIN-CODEINE 100-10 MG/5ML PO SYRP Oral Take 5 mLs by mouth 3 (three)  times daily as needed. For cough    . METOPROLOL TARTRATE 25 MG PO TABS Oral Take 25 mg by mouth 2 (two) times daily.    Marland Kitchen PRESERVISION AREDS 2 PO CAPS Oral Take 2 capsules by mouth daily.      Marland Kitchen OMEPRAZOLE 20 MG PO CPDR Oral Take 20 mg by mouth daily.      Marland Kitchen POTASSIUM CHLORIDE CRYS ER 20 MEQ PO TBCR Oral Take 40 mEq by mouth daily.      BP 136/71  Pulse 68  Resp 16  SpO2 95%  Physical Exam  Constitutional: He is oriented to person, place, and time. He appears well-developed and well-nourished.  HENT:  Head: Normocephalic and atraumatic.  Eyes: Pupils are equal, round, and reactive to light.  Cardiovascular: Normal rate and regular rhythm.  Exam reveals no gallop and no friction rub.   No murmur heard. Pulmonary/Chest: Effort normal. No accessory muscle usage. No apnea, not tachypneic and not bradypneic. No respiratory distress. He has decreased breath sounds in the right lower field and the left lower field.  Genitourinary:       Patient has scrotal swelling noted on exam  Musculoskeletal:       Patient has 2+ pitting edema of his lower extremities.  Neurological: He is alert and oriented to person, place, and time.  Skin: Skin is warm  and dry. No rash noted.    ED Course  Procedures (including critical care time)  Labs Reviewed  CARDIAC PANEL(CRET KIN+CKTOT+MB+TROPI) - Abnormal; Notable for the following:    CK, MB 5.9 (*)    Relative Index 2.7 (*)    All other components within normal limits  CBC - Abnormal; Notable for the following:    RBC 3.96 (*)    Hemoglobin 11.4 (*)    HCT 34.1 (*)    All other components within normal limits  BASIC METABOLIC PANEL - Abnormal; Notable for the following:    Potassium 3.4 (*)    Glucose, Bld 175 (*)    All other components within normal limits  PRO B NATRIURETIC PEPTIDE - Abnormal; Notable for the following:    Pro B Natriuretic peptide (BNP) 789.9 (*)    All other components within normal limits   Dg Chest [facility] 1  View  06/08/2011  *RADIOLOGY REPORT*  Clinical Data: Chest tightness and shortness of breath.  Wheezing and cough.  History of atrial fibrillation.  PORTABLE CHEST - 1 VIEW  Comparison: 10/16/2004  Findings: Mildly degraded exam due to AP portable technique and patient body habitus.  Numerous leads and wires project over the chest.  Midline trachea. Moderate cardiomegaly.  Apparent right paratracheal soft tissue fullness is likely due to technique. No pleural effusion or pneumothorax.  No congestive failure.  Mild interstitial prominence is felt to be technique related.  The right lung is clear.  Suboptimal evaluation of the left lung base secondary to overlying soft tissues.  IMPRESSION: Cardiomegaly without congestive failure or acute disease.  Some limited exam, secondary patient body habitus and AP portable technique.  Original Report Authenticated By: Consuello Bossier, M.D.    I spoke with [facility] cardiology who will be done to see the patient for admission.  The patient has been stable while here in the emergency room.  He was given nitroglycerin sublingually along with aspirin.  He was also given 40 of Lasix.     MDM    Date: 06/08/2011  Rate: 73  Rhythm: atrial fibrillation  QRS Axis: right  Intervals: normal  ST/T Wave abnormalities: normal  Conduction Disutrbances:none  Narrative Interpretation: A-fib noted today  Old EKG Reviewed: changes noted   MDM Reviewed: vitals, nursing note and previous chart Reviewed previous: labs and ECG Interpretation: labs, ECG and x-ray Consults: cardiology         Carlyle Dolly, PA-C 06/08/11 1057

## 2011-06-08 NOTE — ED Notes (Signed)
Pt denies pain at this time but says that he got a little short of breath walking to and from the bathroom.

## 2011-06-08 NOTE — Progress Notes (Signed)
Patient refused insulin and said that he has never taken insulin in his life and he will not start now. Wants explanation from MD.

## 2011-06-08 NOTE — ED Notes (Signed)
Attempt to call report, floor unable to take at this time

## 2011-06-08 NOTE — ED Notes (Signed)
Attempted to call report again, on hold for 7 mins waiting

## 2011-06-08 NOTE — Progress Notes (Signed)
Pt. c/o headache, stated headache prn med was not strong enough. Called provider on-call Lucile Crater, NP) to advise of info, and she placed new order in.

## 2011-06-08 NOTE — ED Provider Notes (Signed)
Medical screening examination/treatment/procedure(s) were conducted as a shared visit with non-physician practitioner(s) and myself.  I personally evaluated the patient during the encounter Patient with increasing shortness of breath consistent with heart failure. He has no prior history of heart failure. He was given Lasix 40 mg by cardiology yesterday and was scheduled for an echo today however due to persistent shortness of breath he was seen here. He has signs of fluid overload on exam and by labs. Patient given IV Lasix and cardiology coming to admit.  Gwyneth Sprout, MD 06/08/11 1505

## 2011-06-09 ENCOUNTER — Other Ambulatory Visit: Payer: Self-pay

## 2011-06-09 DIAGNOSIS — I517 Cardiomegaly: Secondary | ICD-10-CM

## 2011-06-09 DIAGNOSIS — I509 Heart failure, unspecified: Secondary | ICD-10-CM

## 2011-06-09 LAB — CBC
MCH: 28.6 pg (ref 26.0–34.0)
MCHC: 33.1 g/dL (ref 30.0–36.0)
MCV: 86.2 fL (ref 78.0–100.0)
Platelets: 239 10*3/uL (ref 150–400)
RBC: 3.99 MIL/uL — ABNORMAL LOW (ref 4.22–5.81)
RDW: 13.9 % (ref 11.5–15.5)

## 2011-06-09 LAB — BASIC METABOLIC PANEL
CO2: 30 mEq/L (ref 19–32)
Calcium: 8.4 mg/dL (ref 8.4–10.5)
Creatinine, Ser: 0.67 mg/dL (ref 0.50–1.35)
GFR calc non Af Amer: >90 mL/min (ref >90)

## 2011-06-09 LAB — CARDIAC PANEL(CRET KIN+CKTOT+MB+TROPI)
Relative Index: 1.3 (ref 0.0–2.5)
Total CK: 258 U/L — ABNORMAL HIGH (ref 7–232)
Troponin I: <0.3 ng/mL (ref <0.30)

## 2011-06-09 LAB — PROTIME-INR: Prothrombin Time: 16.4 seconds — ABNORMAL HIGH (ref 11.6–15.2)

## 2011-06-09 LAB — GLUCOSE, CAPILLARY
Glucose-Capillary: 126 mg/dL — ABNORMAL HIGH (ref 70–99)
Glucose-Capillary: 113 mg/dL — ABNORMAL HIGH (ref 70–99)
Glucose-Capillary: 120 mg/dL — ABNORMAL HIGH (ref 70–99)

## 2011-06-09 LAB — LIPID PANEL
HDL: 24 mg/dL — ABNORMAL LOW (ref >39)
LDL Cholesterol: 72 mg/dL (ref 0–99)

## 2011-06-09 MED ORDER — ENOXAPARIN SODIUM 150 MG/ML ~~LOC~~ SOLN
130.0000 mg | Freq: Two times a day (BID) | SUBCUTANEOUS | Status: DC
  Administered 2011-06-09: 130 mg via SUBCUTANEOUS
  Administered 2011-06-10: 18:00:00 via SUBCUTANEOUS
  Administered 2011-06-10 – 2011-06-12 (×4): 130 mg via SUBCUTANEOUS
  Filled 2011-06-09 (×8): qty 1

## 2011-06-09 MED ORDER — POTASSIUM CHLORIDE CRYS ER 20 MEQ PO TBCR
40.0000 meq | EXTENDED_RELEASE_TABLET | Freq: Two times a day (BID) | ORAL | Status: DC
  Administered 2011-06-09 – 2011-06-12 (×7): 40 meq via ORAL
  Filled 2011-06-09 (×8): qty 2

## 2011-06-09 MED ORDER — WARFARIN SODIUM 7.5 MG PO TABS
7.5000 mg | ORAL_TABLET | Freq: Once | ORAL | Status: AC
  Administered 2011-06-09: 7.5 mg via ORAL
  Filled 2011-06-09: qty 1

## 2011-06-09 NOTE — Progress Notes (Signed)
Patient ID: [name], male   DOB: 02-15-1946, 66 y.o.   MRN: [id number] Subjective:  Dyspnea improved. Edema improved.  Objective:  Vital Signs in the last 24 hours: Temp:  [98.5 F (36.9 C)-98.8 F (37.1 C)] 98.6 F (37 C) (03/09 0500) Pulse Rate:  [68-108] 80  (03/09 0500) Resp:  [16-24] 18  (03/09 0500) BP: (114-140)/(66-85) 114/66 mmHg (03/09 0500) SpO2:  [87 %-97 %] 90 % (03/09 0500) Weight:  [135.762 kg (299 lb 4.8 oz)-140.615 kg (310 lb)] 135.762 kg (299 lb 4.8 oz) (03/09 0500)  Intake/Output from previous day: 03/08 0701 - 03/09 0700 In: 731 [P.O.:720; I.V.:3; IV Piggyback:8] Out: 3725 [Urine:3725] Intake/Output from this shift:    Physical Exam: Well appearing morbidly obese, NAD HEENT: Unremarkable Neck:  7 cm JVD, no thyromegally Lungs:  Clear except for rales in bases. HEART:  IRegular rate rhythm, no murmurs, no rubs, no clicks Abd:  Obese, positive bowel sounds, no organomegally, no rebound, no guarding Ext:  2 plus pulses, 1+ edema, no cyanosis, no clubbing Skin:  No rashes no nodules Neuro:  CN II through XII intact, motor grossly intact  Lab Results:  Basename 06/09/11 0311 06/08/11 0901  WBC 6.8 6.6  HGB 11.4* 11.4*  PLT 239 239    Basename 06/09/11 0311 06/08/11 0901  NA 139 140  K 3.1* 3.4*  CL 98 103  CO2 30 25  GLUCOSE 140* 175*  BUN 10 9  CREATININE 0.67 0.64    Basename 06/09/11 0305 06/08/11 2108  TROPONINI <0.30 <0.30   Hepatic Function Panel No results found for this basename: PROT,ALBUMIN,AST,ALT,ALKPHOS,BILITOT,BILIDIR,IBILI in the last 72 hours  Basename 06/09/11 0311  CHOL 117   No results found for this basename: PROTIME in the last 72 hours  Imaging: Dg Chest Port 1 View  06/08/2011  *RADIOLOGY REPORT*  Clinical Data: Chest tightness and shortness of breath.  Wheezing and cough.  History of atrial fibrillation.  PORTABLE CHEST - 1 VIEW  Comparison: 10/16/2004  Findings: Mildly degraded exam due to AP portable technique  and patient body habitus.  Numerous leads and wires project over the chest.  Midline trachea. Moderate cardiomegaly.  Apparent right paratracheal soft tissue fullness is likely due to technique. No pleural effusion or pneumothorax.  No congestive failure.  Mild interstitial prominence is felt to be technique related.  The right lung is clear.  Suboptimal evaluation of the left lung base secondary to overlying soft tissues.  IMPRESSION: Cardiomegaly without congestive failure or acute disease.  Some limited exam, secondary patient body habitus and AP portable technique.  Original Report Authenticated By: Consuello Bossier, M.D.    Cardiac Studies: Tele - atrial fib with up to 3 sec pauses while awake. Assessment/Plan:  1. Acute diastolic CHF - Continue IV lasix. 2. Atrial fib with a CVR - he has had asymptomatic pauses. Will follow. 3. Hypokalemia - replete. 4. Disp. - expect he will require an additional 48 hours of inpatient diuresis.   LOS: 1 day    [name] 06/09/2011, 8:10 AM
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
## 2011-06-09 NOTE — Progress Notes (Signed)
  Echocardiogram 2D Echocardiogram has been performed.  [name] L 06/09/2011, 2:04 PM

## 2011-06-09 NOTE — Progress Notes (Signed)
Dr. Ladona Ridgel made aware on rounds of 3.4 sec pause. Pt. Alert and oriented x 3. Asymptomatic.  Blood work ordered.

## 2011-06-10 ENCOUNTER — Other Ambulatory Visit: Payer: Self-pay

## 2011-06-10 LAB — GLUCOSE, CAPILLARY
Glucose-Capillary: 133 mg/dL — ABNORMAL HIGH (ref 70–99)
Glucose-Capillary: 112 mg/dL — ABNORMAL HIGH (ref 70–99)

## 2011-06-10 LAB — BASIC METABOLIC PANEL
BUN: 9 mg/dL (ref 6–23)
CO2: 33 mEq/L — ABNORMAL HIGH (ref 19–32)
Calcium: 8.4 mg/dL (ref 8.4–10.5)
Chloride: 96 mEq/L (ref 96–112)
Chloride: 94 mEq/L — ABNORMAL LOW (ref 96–112)
Creatinine, Ser: 0.77 mg/dL (ref 0.50–1.35)
GFR calc Af Amer: >90 mL/min (ref >90)
GFR calc non Af Amer: >90 mL/min (ref >90)
Glucose, Bld: 150 mg/dL — ABNORMAL HIGH (ref 70–99)

## 2011-06-10 LAB — PROTIME-INR
INR: 1.2 (ref 0.00–1.49)
Prothrombin Time: 15.5 seconds — ABNORMAL HIGH (ref 11.6–15.2)

## 2011-06-10 MED ORDER — POTASSIUM CHLORIDE CRYS ER 20 MEQ PO TBCR
40.0000 meq | EXTENDED_RELEASE_TABLET | Freq: Once | ORAL | Status: AC
  Administered 2011-06-10: 40 meq via ORAL
  Filled 2011-06-10: qty 2

## 2011-06-10 MED ORDER — WARFARIN SODIUM 10 MG PO TABS
10.0000 mg | ORAL_TABLET | Freq: Once | ORAL | Status: AC
  Administered 2011-06-10: 10 mg via ORAL
  Filled 2011-06-10: qty 1
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
## 2011-06-10 NOTE — Progress Notes (Signed)
Nurse reports patient is complaining of muscle cramping. Was found to be hypokalemic on am labs yesterday. No follow-up labs done this am or potassium replacement although he is on 80 mg of lasix BID. Will given him 40 mEq of potassium X 2. Have BMET drawn now. Repeat BMET in am. Daily potassium replacement.  Joni Reining NP

## 2011-06-10 NOTE — Progress Notes (Signed)
Patient ID: [name], male   DOB: July 27, 1945, 66 y.o.   MRN: [id number] Subjective:  Dyspnea improved. Edema improved.  Objective:  Vital Signs in the last 24 hours: Temp:  [98.3 F (36.8 C)-99.1 F (37.3 C)] 99.1 F (37.3 C) (03/10 0539) Pulse Rate:  [71-84] 77  (03/10 0539) Resp:  [19-20] 19  (03/10 0539) BP: (106-135)/(58-86) 123/75 mmHg (03/10 0539) SpO2:  [95 %-99 %] 98 % (03/10 0539) Weight:  [132.813 kg (292 lb 12.8 oz)] 132.813 kg (292 lb 12.8 oz) (03/10 0539)  Intake/Output from previous day: 03/09 0701 - 03/10 0700 In: 1660 [P.O.:1660] Out: 4600 [Urine:4600] Intake/Output from this shift: Total I/O In: 240 [P.O.:240] Out: 200 [Urine:200]  Physical Exam: Well appearing morbidly obese, NAD HEENT: Unremarkable Neck:  6 cm JVD, no thyromegally Lungs:  Clear except for rales in bases. HEART:  IRegular rate rhythm, no murmurs, no rubs, no clicks Abd:  Obese, positive bowel sounds, no organomegally, no rebound, no guarding Ext:  2 plus pulses, 1+ edema, no cyanosis, no clubbing Skin:  No rashes no nodules Neuro:  CN II through XII intact, motor grossly intact  Lab Results:  Basename 06/09/11 0311 06/08/11 0901  WBC 6.8 6.6  HGB 11.4* 11.4*  PLT 239 239    Basename 06/09/11 2354 06/09/11 0311  NA 140 139  K 2.9* 3.1*  CL 96 98  CO2 33* 30  GLUCOSE 150* 140*  BUN 9 10  CREATININE 0.66 0.67    Basename 06/09/11 0305 06/08/11 2108  TROPONINI <0.30 <0.30   Hepatic Function Panel No results found for this basename: PROT,ALBUMIN,AST,ALT,ALKPHOS,BILITOT,BILIDIR,IBILI in the last 72 hours  Basename 06/09/11 0311  CHOL 117   No results found for this basename: PROTIME in the last 72 hours  Imaging: Dg Chest Port 1 View  06/08/2011  *RADIOLOGY REPORT*  Clinical Data: Chest tightness and shortness of breath.  Wheezing and cough.  History of atrial fibrillation.  PORTABLE CHEST - 1 VIEW  Comparison: 10/16/2004  Findings: Mildly degraded exam due to AP portable  technique and patient body habitus.  Numerous leads and wires project over the chest.  Midline trachea. Moderate cardiomegaly.  Apparent right paratracheal soft tissue fullness is likely due to technique. No pleural effusion or pneumothorax.  No congestive failure.  Mild interstitial prominence is felt to be technique related.  The right lung is clear.  Suboptimal evaluation of the left lung base secondary to overlying soft tissues.  IMPRESSION: Cardiomegaly without congestive failure or acute disease.  Some limited exam, secondary patient body habitus and AP portable technique.  Original Report Authenticated By: Consuello Bossier, M.D.    Cardiac Studies: Tele - atrial fib with up to 3 sec pauses while awake. Assessment/Plan:  1. Acute diastolic CHF - Continue IV lasix. 2. Atrial fib with a CVR - he has had asymptomatic pauses. Will follow. 3. Hypokalemia - replete. 4. Disp. - expect he will require an additional 24 hours of inpatient diuresis.   LOS: 2 days    [name] 06/10/2011, 9:11 AM

## 2011-06-11 DIAGNOSIS — I4891 Unspecified atrial fibrillation: Secondary | ICD-10-CM

## 2011-06-11 LAB — BASIC METABOLIC PANEL
CO2: 32 mEq/L (ref 19–32)
Calcium: 8.8 mg/dL (ref 8.4–10.5)
Glucose, Bld: 134 mg/dL — ABNORMAL HIGH (ref 70–99)
Sodium: 138 mEq/L (ref 135–145)

## 2011-06-11 LAB — GLUCOSE, CAPILLARY: Glucose-Capillary: 107 mg/dL — ABNORMAL HIGH (ref 70–99)

## 2011-06-11 MED ORDER — WARFARIN SODIUM 10 MG PO TABS
10.0000 mg | ORAL_TABLET | Freq: Once | ORAL | Status: AC
Start: 2011-06-11 — End: 2011-06-11
  Administered 2011-06-11: 10 mg via ORAL
  Filled 2011-06-11: qty 1

## 2011-06-11 NOTE — Progress Notes (Signed)
Patient ID: [name], male   DOB: Mar 28, 1946, 66 y.o.   MRN: [id number] Subjective:  Doing well,  SOB is improving,  Continues to have significant BLE edema.  Objective:  Vital Signs in the last 24 hours: Temp:  [98.6 F (37 C)-99.4 F (37.4 C)] 99.1 F (37.3 C) (03/11 0538) Pulse Rate:  [82-92] 92  (03/11 0538) Resp:  [19-20] 19  (03/11 0538) BP: (113-146)/(77-90) 145/90 mmHg (03/11 0538) SpO2:  [92 %-96 %] 94 % (03/11 0538) Weight:  [288 lb 12.8 oz (130.999 kg)] 288 lb 12.8 oz (130.999 kg) (03/11 0538)  Intake/Output from previous day: 03/10 0701 - 03/11 0700 In: 1292 [P.O.:1260; IV Piggyback:32] Out: 5050 [Urine:5050] Intake/Output from this shift:    Physical Exam: Well appearing morbidly obese, NAD HEENT: OP clear Neck:  6 cm JVD, no thyromegally Lungs:  Clear . HEART:  IRegular rate rhythm, no murmurs, no rubs, no clicks Abd:  Obese, positive bowel sounds, no organomegally, no rebound, no guarding Ext:  2 plus pulses, 1+ edema, no cyanosis, no clubbing Skin:  No rashes no nodules Neuro:  CN II through XII intact, motor grossly intact  Lab Results:  Basename 06/09/11 0311 06/08/11 0901  WBC 6.8 6.6  HGB 11.4* 11.4*  PLT 239 239    Basename 06/11/11 0558 06/10/11 1643  NA 138 138  K 3.8 3.4*  CL 96 94*  CO2 32 33*  GLUCOSE 134* 126*  BUN 10 9  CREATININE 0.69 0.77    Basename 06/09/11 0305 06/08/11 2108  TROPONINI <0.30 <0.30   Hepatic Function Panel No results found for this basename: PROT,ALBUMIN,AST,ALT,ALKPHOS,BILITOT,BILIDIR,IBILI in the last 72 hours  Basename 06/09/11 0311  CHOL 117   No results found for this basename: PROTIME in the last 72 hours  Imaging: No results found.  Cardiac Studies: Tele - atrial fib  Assessment/Plan:  1. Acute diastolic CHF - Continue IV lasix x 1 more day 2. Atrial fib- stable rates, continue coumadin (Goal INR 2-3) 3. Hypokalemia - replete. 4. Disp. - expect he will require an additional 24 hours of  inpatient diuresis.     LOS: 3 days    Hillis Range 06/11/2011, 7:43 AM

## 2011-06-11 NOTE — Progress Notes (Signed)
Patient heart rate dropped to 24. 2.78 sec pause.  Patient asymptomatic. B/p 120/82.  Tiffany RN notified.

## 2011-06-11 NOTE — Progress Notes (Signed)
ANTICOAGULATION CONSULT NOTE - Follow Up Consult  Pharmacy Consult for coumadin Indication: atrial fibrillation  Vital Signs: Temp: 99.1 F (37.3 C) (03/11 0538) Temp src: Oral (03/11 0538) BP: 145/90 mmHg (03/11 0538) Pulse Rate: 92  (03/11 0538)  Labs:  Alvira Philips 06/11/11 0558 06/10/11 1643 06/10/11 1058 06/09/11 2354 06/09/11 0311 06/09/11 0305 06/08/11 2108 06/08/11 1548  HGB -- -- -- -- 11.4* -- -- --  HCT -- -- -- -- 34.4* -- -- --  PLT -- -- -- -- 239 -- -- --  APTT -- -- -- -- -- -- -- --  LABPROT 15.5* -- 15.5* 15.9* -- -- -- --  INR 1.20 -- 1.20 1.24 -- -- -- --  HEPARINUNFRC -- -- -- -- -- -- -- --  CREATININE 0.69 0.77 -- 0.66 -- -- -- --  CKTOTAL -- -- -- -- -- 261* 258* 220  CKMB -- -- -- -- -- 3.5 4.0 5.1*  TROPONINI -- -- -- -- -- <0.30 <0.30 <0.30   Estimated Creatinine Clearance: 126.9 ml/min (by C-G formula based on Cr of 0.69).  Assessment: 38 yom with hx of afib on aspirin 325mg  daily PTA for stroke prevention. Now starting on coumadin with lovenox bridge. INR has not increased much after 3 dose of coumadin. No bleeding or other problems noted. Lovenox dose is appropriate.   Goal of Therapy:  INR 2-3   Plan:  1. Repeat coumadin 10mg  PO x 1 tonight - may need to increase if still no response tomorrow 2. Continue lovenxo 130mg  SQ Q12H 3. F/u AM INR   Treyton Slimp, Drake Leach 06/11/2011,11:05 AM

## 2011-06-11 NOTE — Plan of Care (Signed)
Problem: Phase I Progression Outcomes Goal: EF % per last Echo/documented,Core Reminder form on chart Outcome: Completed/Met Date Met:  06/11/11 EF 55-60% 06/09/11

## 2011-06-11 NOTE — Progress Notes (Signed)
06/11/11  1554 UR Completed. Tera Mater, RN, BSN Nurse Case Manager [phone]

## 2011-06-11 NOTE — Progress Notes (Signed)
Pt HR dropped to 28 and pt had a 2.78 pause.  Pt was asymptomatic and BP was 120/82.  Dunn PA was notified and said to continue to monitor.

## 2011-06-12 ENCOUNTER — Telehealth: Payer: Self-pay | Admitting: Family Medicine

## 2011-06-12 ENCOUNTER — Encounter (HOSPITAL_COMMUNITY): Payer: Self-pay | Admitting: Physician Assistant

## 2011-06-12 DIAGNOSIS — I10 Essential (primary) hypertension: Secondary | ICD-10-CM

## 2011-06-12 LAB — BASIC METABOLIC PANEL
BUN: 13 mg/dL (ref 6–23)
Calcium: 9 mg/dL (ref 8.4–10.5)
Creatinine, Ser: 0.63 mg/dL (ref 0.50–1.35)
GFR calc Af Amer: >90 mL/min (ref >90)
GFR calc non Af Amer: >90 mL/min (ref >90)
Potassium: 3.7 mEq/L (ref 3.5–5.1)

## 2011-06-12 LAB — PROTIME-INR: Prothrombin Time: 17 seconds — ABNORMAL HIGH (ref 11.6–15.2)

## 2011-06-12 LAB — GLUCOSE, CAPILLARY: Glucose-Capillary: 135 mg/dL — ABNORMAL HIGH (ref 70–99)

## 2011-06-12 MED ORDER — POTASSIUM CHLORIDE CRYS ER 20 MEQ PO TBCR: 20.0000 meq | EXTENDED_RELEASE_TABLET | Freq: Every day | ORAL | Status: DC

## 2011-06-12 MED ORDER — WARFARIN SODIUM 10 MG PO TABS
12.5000 mg | ORAL_TABLET | Freq: Once | ORAL | Status: DC
  Filled 2011-06-12: qty 1

## 2011-06-12 MED ORDER — WARFARIN SODIUM 5 MG PO TABS: ORAL_TABLET | ORAL | Status: DC

## 2011-06-12 MED ORDER — FUROSEMIDE 40 MG PO TABS: 40.0000 mg | ORAL_TABLET | Freq: Two times a day (BID) | ORAL | Status: DC

## 2011-06-12 NOTE — Progress Notes (Signed)
ANTICOAGULATION CONSULT NOTE - Follow Up Consult  Pharmacy Consult for coumadin Indication: atrial fibrillation  Vital Signs: Temp: 97.9 F (36.6 C) (03/12 0800) Temp src: Oral (03/12 0800) BP: 145/63 mmHg (03/12 0800) Pulse Rate: 82  (03/12 0800)  Labs:  Basename 06/12/11 0520 06/11/11 0558 06/10/11 1643 06/10/11 1058  HGB -- -- -- --  HCT -- -- -- --  PLT -- -- -- --  APTT -- -- -- --  LABPROT 17.0* 15.5* -- 15.5*  INR 1.36 1.20 -- 1.20  HEPARINUNFRC -- -- -- --  CREATININE 0.63 0.69 0.77 --  CKTOTAL -- -- -- --  CKMB -- -- -- --  TROPONINI -- -- -- --   Estimated Creatinine Clearance: 125.9 ml/min (by C-G formula based on Cr of 0.63).  Assessment: 11 yom with history of afib on aspirin 325mg  daily PTA for stroke prevention. Now started on coumadin with lovenox bridge. INR with slightly increase but very minimal. No bleeding noted, no CBC available today. Likely discharge today.   Goal of Therapy:  INR 2-3   Plan:  1. Coumadin 12.5mg  PO x 1 today 2. F/u AM INR if not discharged 3. Discharge recommendations -Write prescription for 5mg  tablets with the patient taking 2.5 tablets (12.5mg ) daily. Will need an INR check in 1-2 days since INR still remains very subtherapeutic.  Please call with any questions. Thanks!    Victoria Henshaw, Drake Leach 06/12/2011,9:08 AM

## 2011-06-12 NOTE — Telephone Encounter (Signed)
Patient was a patient of Dr.Schaller's.  Patient hasn't been seen at our office in 5 years.  Patient's wife is calling to make a hospital follow up appointment for patient.  He was d/c from [facility] today.  He was in for congestive heart failure and Dr.Taylor wanted him seen by his pcp within 5 days.  His wife said he hasn't been seen in our office because he doesn't get sick.  Will you see patient?

## 2011-06-12 NOTE — Telephone Encounter (Signed)
Yes. Needs .

## 2011-06-12 NOTE — Telephone Encounter (Signed)
I spoke with patient's wife and scheduled appointment for 06/15/11.

## 2011-06-12 NOTE — Progress Notes (Signed)
Patient ID: Selso Mannor, male   DOB: 12/21/1945, 66 y.o.   MRN: [id number] Subjective:  Doing well,  SOB is much improved,  Edema is better  Objective:  Vital Signs in the last 24 hours: Temp:  [97.3 F (36.3 C)-99.2 F (37.3 C)] 97.3 F (36.3 C) (03/12 0446) Pulse Rate:  [70-87] 87  (03/12 0446) Resp:  [20-22] 22  (03/12 0446) BP: (109-149)/(72-97) 149/97 mmHg (03/12 0446) SpO2:  [93 %-97 %] 97 % (03/12 0446) Weight:  [284 lb 2.8 oz (128.9 kg)] 284 lb 2.8 oz (128.9 kg) (03/12 0446)  Intake/Output from previous day: 03/11 0701 - 03/12 0700 In: 840 [P.O.:840] Out: 3500 [Urine:3500] Intake/Output from this shift:    Physical Exam: Well appearing morbidly obese, NAD HEENT: OP clear Neck:  6 cm JVD, no thyromegally Lungs:  Clear . HEART:  IRegular rate rhythm, no murmurs, no rubs, no clicks Abd:  Obese, positive bowel sounds, no organomegally, no rebound, no guarding Ext:  2 plus pulses, 1+ edema, no cyanosis, no clubbing Skin:  No rashes no nodules Neuro:  CN II through XII intact, motor grossly intact  Lab Results: No results found for this basename: WBC:2,HGB:2,PLT:2 in the last 72 hours  Basename 06/12/11 0520 06/11/11 0558  NA 135 138  K 3.7 3.8  CL 96 96  CO2 29 32  GLUCOSE 133* 134*  BUN 13 10  CREATININE 0.63 0.69   No results found for this basename: TROPONINI:2,CK,MB:2 in the last 72 hours Hepatic Function Panel No results found for this basename: PROT,ALBUMIN,AST,ALT,ALKPHOS,BILITOT,BILIDIR,IBILI in the last 72 hours No results found for this basename: CHOL in the last 72 hours No results found for this basename: PROTIME in the last 72 hours  Imaging: No results found.  Cardiac Studies: Tele - atrial fib   Assessment/Plan:  1. Acute diastolic CHF - convert to lasix 40mg  PO BID 2. Atrial fib- stable rates, continue coumadin (Goal INR 2-3),  Will need close follow-up in the coumadin clinic, would not bridge with lovenox at discharge 3. Hypokalemia - KCL  daily upon discharge 4. Bradycardia- asymptomatic, no further workup planned 5. Disp. - DC to home today  Follow-up with Tereso Newcomer in 1 week,  Will need BMET also Follow-up with Dr Ladona Ridgel in 4-6 weeks    LOS: 4 days    Hillis Range 06/12/2011, 7:42 AM

## 2011-06-12 NOTE — Progress Notes (Signed)
1345 discharge instructions and prescriptions given to pt and spouse . verbaiize understanding. Transported to lobby by Psychiatrist

## 2011-06-12 NOTE — Plan of Care (Signed)
Problem: Food- and Nutrition-Related Knowledge Deficit (NB-1.1) Goal: Nutrition education Formal process to instruct or train a patient/client in a skill or to impart knowledge to help patients/clients voluntarily manage or modify food choices and eating behavior to maintain or improve health.  Outcome: Completed/Met Date Met:  06/12/11 RD asked to visit with patient and wife by Case Manager. Patient had questions about amount of daily sodium intake. RD went over recommendations. RD provided [employer] hand out for heart healthy eating. Patient with many questions, RD answered. Patient appreciative education, RD expects good compliance with diet. No other nutrition interventions at this time. Patient states he is waiting for discharge.   [name]
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
## 2011-06-13 ENCOUNTER — Ambulatory Visit (INDEPENDENT_AMBULATORY_CARE_PROVIDER_SITE_OTHER): Payer: Self-pay | Admitting: *Deleted

## 2011-06-13 ENCOUNTER — Encounter: Payer: Self-pay | Admitting: Family Medicine

## 2011-06-13 DIAGNOSIS — I4892 Unspecified atrial flutter: Secondary | ICD-10-CM

## 2011-06-13 DIAGNOSIS — I4821 Permanent atrial fibrillation: Secondary | ICD-10-CM

## 2011-06-13 DIAGNOSIS — Z7901 Long term (current) use of anticoagulants: Secondary | ICD-10-CM

## 2011-06-13 DIAGNOSIS — I4891 Unspecified atrial fibrillation: Secondary | ICD-10-CM

## 2011-06-13 LAB — POCT INR: INR: 1.6

## 2011-06-13 NOTE — Patient Instructions (Signed)

## 2011-06-13 NOTE — Progress Notes (Signed)
CARE MANAGEMENT NOTE HEART FAILURE  06/13/2011   Patient:  [name], [name]   Account Number:  [id number]    Date Initiated:  06/11/2011  Documentation initiated by:  Tera Mater  Subjective/Objective Assessment:   66yo male admitted with SOB, Chest Pain, LLE.  HX:  Afib, CAD, HTN, Morbid Obesity.  Pt. lives at home with spouse.   Action/Plan:   Discharge planning for possible [facility] RN for HF management. In addition, pt. may be a good candidate for [facility]).  Will speak with pt. about [facility] services.   Anticipated DC Date:  06/12/2011  Anticipated DC Plan:  HOME W HOME HEALTH SERVICES  DC Planning Services:  CM consult    Choice offered to / List presented to:  C-1 Patient    HH arranged:  HH-1 RN  HH-10 DISEASE MANAGEMENT     HH agency:  [facility]    Status of service:  Completed, signed off  Medicare Important Message Given:  NO (If response is "NO", the following Medicare IM given date fields will be blank) Date Medicare IM Given:   Date Additional Medicare IM Given:    Discharge Disposition:  HOME W HOME HEALTH SERVICES  Per UR Regulation:  Reviewed for med. necessity/level of care/duration of stay  Comments:   06/12/11 1145 Spoke with pt. about discharge planning.  Pt. was interested in having a HH RN to manage his HF and he chose [facility].  In addition, he was interested in  being followed by [employer].  Presently he does not have any insurance, however he states that April 1st he will have Medicare and BCBS supplement.  TC to Tim, with [facility] to give referral.  TC to Hilda Lias, with [facility] to give referral for [facility] RN for HF management. Spouse had questions about diet and cookbooks.  Tresa Endo, HF RD will come to speak with pt. and spouse about low sodium diet. Tera Mater, RN, BSN Nurse Case Manager [phone]   06/11/11 1554 UR Completed. Tera Mater, RN, BSN Nurse Case Manager  [phone]   Initial CM contact:  06/12/2011 11:45 AM  By:  Tera Mater Initial CSW contact:     By:      Is this an INP Readmission < 30 days:  N (If "YES" please see readmission information at the bottom of note)  Patient living status prior to this admission:  FAMILY  Patient setting prior to this admission:  HOME  Comorbid conditions being treated that contributed to this admission:  HTN, AFIB, CAD, MORBID OBESITY  CHF Readmission Risk:  high  Type of patient education provided  Discharge Instructions  HF Patient Education Assessment / Teach Back  HF Zone Tool / Magnet  Limit salt intake  Weigh daily     Patient education provided by  [facility]    Was referral made to [facility]:  Y  Is the patient's PCP the same as attending:  N PCP:  BEDSOLE,AMY  Readmission < 30 Days If pt has HH, did they contact the agency before going to the ED:   Name of [facility] agency:    Was the follow-up physician visit scheduled prior to discharge:    Did the patient follow-up with the physician prior to this readmission:    Was there HF Clinic visits prior to readmission:    Were there ED visits between admissions:    Readmit type:    If unscheduled and related indicate reason for readmit:

## 2011-06-15 ENCOUNTER — Encounter: Payer: Self-pay | Admitting: Family Medicine

## 2011-06-15 ENCOUNTER — Ambulatory Visit (INDEPENDENT_AMBULATORY_CARE_PROVIDER_SITE_OTHER): Payer: Self-pay | Admitting: Family Medicine

## 2011-06-15 VITALS — BP 108/60 | HR 78 | Temp 97.9°F | Wt 284.0 lb

## 2011-06-15 DIAGNOSIS — E119 Type 2 diabetes mellitus without complications: Secondary | ICD-10-CM

## 2011-06-15 DIAGNOSIS — I10 Essential (primary) hypertension: Secondary | ICD-10-CM

## 2011-06-15 DIAGNOSIS — D649 Anemia, unspecified: Secondary | ICD-10-CM

## 2011-06-15 DIAGNOSIS — I4891 Unspecified atrial fibrillation: Secondary | ICD-10-CM

## 2011-06-15 DIAGNOSIS — I509 Heart failure, unspecified: Secondary | ICD-10-CM

## 2011-06-15 DIAGNOSIS — I472 Ventricular tachycardia, unspecified: Secondary | ICD-10-CM

## 2011-06-15 NOTE — Progress Notes (Signed)
Was at [facility] with fluid retention and chest pain.  Dx'd with CHF and DM2.  Anemia noted during inpatient stay.  H/o AF.  Doesn't have a pacer.  Recent coumadin start.  He feels much better than during the admission.    Anemia- no blood in stool. No other bleeding, bruising (except from lovenox).  No h/o anemia o/w.   DM.  New dx.  Had experience with family members.  He's aware of complication risk.  A1c is 7.  We discussed.   CHF.  No CP, SOB, BLE edema.  Weight is stable.  Sleeping is better.    Psoriasis.  Plaques noted.   PMH and SH reviewed  ROS: See HPI, otherwise noncontributory.  Meds, vitals, and allergies reviewed.   GEN: nad, alert and oriented HEENT: mucous membranes moist NECK: supple w/o LA CV: IRR, not tachy PULM: ctab, no inc wob ABD: soft, +bs EXT: no edema SKIN: no acute rash but bruising at lovenox sites and psoriasis plaques noted  Diabetic foot exam: Normal inspection No skin breakdown No calluses  Normal DP pulses Normal sensation to light touch and monofilament Nails normal

## 2011-06-15 NOTE — Patient Instructions (Addendum)
I would look up the American Diabetes Association website and start reading about type 2 DM, diet and exercise.   We'll contact you with your lab report. We may need to get you to see GI.   Recheck A1c in 3 months with OV a few days later.   Don't change your meds for now.

## 2011-06-16 LAB — CBC WITH DIFFERENTIAL/PLATELET
Eosinophils Absolute: 0.4 10*3/uL (ref 0.0–0.7)
Hemoglobin: 13.3 g/dL (ref 13.0–17.0)
Lymphs Abs: 1.9 10*3/uL (ref 0.7–4.0)
MCH: 28.2 pg (ref 26.0–34.0)
Monocytes Relative: 10 % (ref 3–12)
Neutro Abs: 4.9 10*3/uL (ref 1.7–7.7)
Neutrophils Relative %: 62 % (ref 43–77)
Platelets: 334 10*3/uL (ref 150–400)
RBC: 4.72 MIL/uL (ref 4.22–5.81)
WBC: 7.9 10*3/uL (ref 4.0–10.5)

## 2011-06-16 LAB — BASIC METABOLIC PANEL
BUN: 16 mg/dL (ref 6–23)
Calcium: 9 mg/dL (ref 8.4–10.5)
Creat: 0.85 mg/dL (ref 0.50–1.35)

## 2011-06-17 ENCOUNTER — Encounter: Payer: Self-pay | Admitting: Family Medicine

## 2011-06-17 DIAGNOSIS — D638 Anemia in other chronic diseases classified elsewhere: Secondary | ICD-10-CM | POA: Insufficient documentation

## 2011-06-17 DIAGNOSIS — E119 Type 2 diabetes mellitus without complications: Secondary | ICD-10-CM | POA: Insufficient documentation

## 2011-06-17 NOTE — Assessment & Plan Note (Signed)
Controlled.  

## 2011-06-17 NOTE — Assessment & Plan Note (Signed)
Rate controlled, INR per cards.

## 2011-06-17 NOTE — Assessment & Plan Note (Signed)
Much improved symptomatically, continue current meds.  Recheck BMET today.  INR per cards.

## 2011-06-17 NOTE — Assessment & Plan Note (Signed)
Pt education given, he'll work on diet, weight, and recheck sugar in 3 months.  He agrees.

## 2011-06-17 NOTE — Assessment & Plan Note (Signed)
Resolved, will d/w pt about colon cancer screening in the future but w/o blood in stool and with normal labs, doesn't need GI eval right now.

## 2011-06-18 ENCOUNTER — Encounter: Payer: Self-pay | Admitting: *Deleted

## 2011-06-19 ENCOUNTER — Ambulatory Visit (INDEPENDENT_AMBULATORY_CARE_PROVIDER_SITE_OTHER): Payer: Self-pay

## 2011-06-19 ENCOUNTER — Other Ambulatory Visit: Payer: Self-pay

## 2011-06-19 ENCOUNTER — Ambulatory Visit (INDEPENDENT_AMBULATORY_CARE_PROVIDER_SITE_OTHER): Payer: Self-pay | Admitting: Physician Assistant

## 2011-06-19 ENCOUNTER — Encounter: Payer: Self-pay | Admitting: Physician Assistant

## 2011-06-19 VITALS — BP 97/65 | HR 85 | Ht 70.0 in | Wt 278.0 lb

## 2011-06-19 DIAGNOSIS — I10 Essential (primary) hypertension: Secondary | ICD-10-CM

## 2011-06-19 DIAGNOSIS — Z7901 Long term (current) use of anticoagulants: Secondary | ICD-10-CM

## 2011-06-19 DIAGNOSIS — J329 Chronic sinusitis, unspecified: Secondary | ICD-10-CM

## 2011-06-19 DIAGNOSIS — I4891 Unspecified atrial fibrillation: Secondary | ICD-10-CM

## 2011-06-19 DIAGNOSIS — I5032 Chronic diastolic (congestive) heart failure: Secondary | ICD-10-CM

## 2011-06-19 DIAGNOSIS — I509 Heart failure, unspecified: Secondary | ICD-10-CM

## 2011-06-19 DIAGNOSIS — I4821 Permanent atrial fibrillation: Secondary | ICD-10-CM

## 2011-06-19 DIAGNOSIS — I4892 Unspecified atrial flutter: Secondary | ICD-10-CM

## 2011-06-19 LAB — POCT INR: INR: 2.8

## 2011-06-19 NOTE — Patient Instructions (Signed)
DR. Ladona Ridgel 07/26/11 @ 10:30  FOLLOW UP WITH PRIMARY CARE FOR SINUSITIS  WEIGH DAILY AND IF WEIGHT IS INCREASED BY >3 LB'S CALL OFFICE [place], [place] [phone]  Your physician has recommended you make the following change in your medication: DECREASE LASIX TO 60 MG DAILY; DECREASE POTASSIUM TO 10 MEQ DAILY
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
## 2011-06-27 ENCOUNTER — Ambulatory Visit (INDEPENDENT_AMBULATORY_CARE_PROVIDER_SITE_OTHER): Payer: Self-pay

## 2011-06-27 DIAGNOSIS — I4891 Unspecified atrial fibrillation: Secondary | ICD-10-CM

## 2011-06-27 DIAGNOSIS — I509 Heart failure, unspecified: Secondary | ICD-10-CM

## 2011-06-27 DIAGNOSIS — I4821 Permanent atrial fibrillation: Secondary | ICD-10-CM

## 2011-06-27 DIAGNOSIS — I5032 Chronic diastolic (congestive) heart failure: Secondary | ICD-10-CM

## 2011-06-27 DIAGNOSIS — Z7901 Long term (current) use of anticoagulants: Secondary | ICD-10-CM

## 2011-06-27 DIAGNOSIS — I4892 Unspecified atrial flutter: Secondary | ICD-10-CM

## 2011-06-27 LAB — POCT INR: INR: 3.8

## 2011-06-28 ENCOUNTER — Telehealth: Payer: Self-pay | Admitting: *Deleted

## 2011-06-28 DIAGNOSIS — I5032 Chronic diastolic (congestive) heart failure: Secondary | ICD-10-CM

## 2011-06-28 LAB — BASIC METABOLIC PANEL
BUN/Creatinine Ratio: 28 — ABNORMAL HIGH (ref 10–22)
BUN: 25 mg/dL (ref 8–27)
CO2: 18 mmol/L — ABNORMAL LOW (ref 20–32)
Calcium: 9.1 mg/dL (ref 8.6–10.2)
Chloride: 96 mmol/L — ABNORMAL LOW (ref 97–108)
Creatinine, Ser: 0.88 mg/dL (ref 0.76–1.27)
Sodium: 134 mmol/L (ref 134–144)

## 2011-06-28 NOTE — Telephone Encounter (Signed)
Message copied by Tarri Fuller on Thu Jun 28, 2011  9:23 AM ------      Message from: [place], [place] T      Created: Thu Jun 28, 2011  8:10 AM       Please notify patient that the lab results are ok.      Tereso Newcomer, PA-C  8:10 AM 06/28/2011

## 2011-06-28 NOTE — Telephone Encounter (Signed)
Message copied by Tarri Fuller on Thu Jun 28, 2011  4:42 PM ------      Message from: [place], [place] T      Created: Thu Jun 28, 2011  1:19 PM       CO2 came back after first result note      It is a little low      Make sure he is feeling ok.  Make sure sugars are not out of control.      ? Lab error - if doing ok, repeat early next week (BMET)      Tereso Newcomer, PA-C  1:18 PM 06/28/2011

## 2011-06-28 NOTE — Telephone Encounter (Signed)
pt notified of CO2 low, pt states he has had afew dizzy spells and has felt fatigued, but also states he feels he is doing pretty well though, I advised pt that you may still want to do repeat labs and he said that would be ok but could he just get it done 4/3 when he is out in the [facility] office for his inr check. I told him that would probably be fine but to let me send a note to Tereso Newcomer to see if he does still need lab work or not. Danielle Rankin

## 2011-06-28 NOTE — Telephone Encounter (Signed)
pt's wife notified of lab results today. [name]  

## 2011-07-04 ENCOUNTER — Telehealth: Payer: Self-pay

## 2011-07-04 ENCOUNTER — Ambulatory Visit (INDEPENDENT_AMBULATORY_CARE_PROVIDER_SITE_OTHER): Payer: Medicare Other

## 2011-07-04 DIAGNOSIS — I4892 Unspecified atrial flutter: Secondary | ICD-10-CM

## 2011-07-04 DIAGNOSIS — I4821 Permanent atrial fibrillation: Secondary | ICD-10-CM

## 2011-07-04 DIAGNOSIS — I10 Essential (primary) hypertension: Secondary | ICD-10-CM

## 2011-07-04 DIAGNOSIS — I4891 Unspecified atrial fibrillation: Secondary | ICD-10-CM

## 2011-07-04 DIAGNOSIS — I509 Heart failure, unspecified: Secondary | ICD-10-CM

## 2011-07-04 DIAGNOSIS — Z7901 Long term (current) use of anticoagulants: Secondary | ICD-10-CM

## 2011-07-04 NOTE — Telephone Encounter (Signed)
Stop K+. Hold Lasix for 2 days. Then resume Lasix at 20 mg QD. Weigh daily and call if:  Weight up 3 lbs in one day, increased swelling or increased dyspnea.  Check BMET in one week and weigh and record (can do at next coumadin check next week). Tereso Newcomer, PA-C  1:04 PM 07/04/2011

## 2011-07-04 NOTE — Telephone Encounter (Signed)
Pt presents with c/o of dizziness x 3 days.  BP checked 90/60.  Pt saw Tereso Newcomer, [place] on 06/19/11 and was found to have low BP at that time as well.  Lasix decreased to 60mg  daily.  Pt continues to lose weight down 54 lbs per pt's wife report, denies any fluid or weight gain since dosage of Lasix decreased.  Pt states dizziness worse from sitting or laying to standing.  Please advise if any med changes recommended or sooner OV needed prior to scheduled appt with Dr Ladona Ridgel on 07/26/11. Thanks

## 2011-07-04 NOTE — Telephone Encounter (Signed)
Called spoke with pt's wife advised of Tereso Newcomer, PA's recommendations to stop K+, Hold Lasix for 2 days, then resume Lasix at 20mg  daily.  Advised to continue checking wight daily and call if up 3lbs or more in one day, or if pt is experiencing incr swelling or SOB.  Pt's wife verbalized understanding and repeated instructions back to me. Advised pt's wife we will check bloodwork at Coumadin check next week in [place] office and weigh him as well per Tereso Newcomer, PA's request.  Pt's wife agrees.

## 2011-07-11 ENCOUNTER — Other Ambulatory Visit: Payer: Medicare Other

## 2011-07-11 ENCOUNTER — Ambulatory Visit (INDEPENDENT_AMBULATORY_CARE_PROVIDER_SITE_OTHER): Payer: Medicare Other

## 2011-07-11 DIAGNOSIS — Z7901 Long term (current) use of anticoagulants: Secondary | ICD-10-CM

## 2011-07-11 DIAGNOSIS — I4891 Unspecified atrial fibrillation: Secondary | ICD-10-CM

## 2011-07-11 DIAGNOSIS — I5032 Chronic diastolic (congestive) heart failure: Secondary | ICD-10-CM

## 2011-07-11 DIAGNOSIS — I4821 Permanent atrial fibrillation: Secondary | ICD-10-CM

## 2011-07-11 DIAGNOSIS — I4892 Unspecified atrial flutter: Secondary | ICD-10-CM

## 2011-07-11 LAB — POCT INR: INR: 3.5

## 2011-07-12 LAB — BASIC METABOLIC PANEL
Chloride: 101 mmol/L (ref 97–108)
GFR calc Af Amer: 109 mL/min/{1.73_m2} (ref >59)
GFR calc non Af Amer: 94 mL/min/{1.73_m2} (ref >59)
Potassium: 4.2 mmol/L (ref 3.5–5.2)
Sodium: 134 mmol/L (ref 134–144)

## 2011-07-13 ENCOUNTER — Telehealth: Payer: Self-pay | Admitting: *Deleted

## 2011-07-13 NOTE — Telephone Encounter (Signed)
Message copied by Tarri Fuller on Fri Jul 13, 2011  1:03 PM ------      Message from: [place], [place] T      Created: Fri Jul 13, 2011 12:55 PM       Please notify patient that the lab results are ok.      Tereso Newcomer, PA-C  12:55 PM 07/13/2011

## 2011-07-13 NOTE — Telephone Encounter (Signed)
lmom labs ok. Tavin Vernet  

## 2011-07-18 ENCOUNTER — Ambulatory Visit (INDEPENDENT_AMBULATORY_CARE_PROVIDER_SITE_OTHER): Payer: Medicare Other

## 2011-07-18 DIAGNOSIS — I4892 Unspecified atrial flutter: Secondary | ICD-10-CM

## 2011-07-18 DIAGNOSIS — Z7901 Long term (current) use of anticoagulants: Secondary | ICD-10-CM

## 2011-07-18 DIAGNOSIS — I4891 Unspecified atrial fibrillation: Secondary | ICD-10-CM

## 2011-07-18 DIAGNOSIS — I4821 Permanent atrial fibrillation: Secondary | ICD-10-CM

## 2011-07-26 ENCOUNTER — Ambulatory Visit (INDEPENDENT_AMBULATORY_CARE_PROVIDER_SITE_OTHER): Payer: Medicare Other | Admitting: Internal Medicine

## 2011-07-26 ENCOUNTER — Ambulatory Visit (INDEPENDENT_AMBULATORY_CARE_PROVIDER_SITE_OTHER): Payer: Medicare Other

## 2011-07-26 ENCOUNTER — Encounter: Payer: Self-pay | Admitting: Internal Medicine

## 2011-07-26 ENCOUNTER — Other Ambulatory Visit: Payer: Self-pay | Admitting: *Deleted

## 2011-07-26 VITALS — BP 128/89 | HR 110 | Ht 69.5 in | Wt 273.4 lb

## 2011-07-26 DIAGNOSIS — I4891 Unspecified atrial fibrillation: Secondary | ICD-10-CM

## 2011-07-26 DIAGNOSIS — I509 Heart failure, unspecified: Secondary | ICD-10-CM

## 2011-07-26 DIAGNOSIS — I4892 Unspecified atrial flutter: Secondary | ICD-10-CM

## 2011-07-26 DIAGNOSIS — Z7901 Long term (current) use of anticoagulants: Secondary | ICD-10-CM

## 2011-07-26 DIAGNOSIS — I5032 Chronic diastolic (congestive) heart failure: Secondary | ICD-10-CM

## 2011-07-26 DIAGNOSIS — I4821 Permanent atrial fibrillation: Secondary | ICD-10-CM

## 2011-07-26 MED ORDER — DABIGATRAN ETEXILATE MESYLATE 150 MG PO CAPS: 150.0000 mg | ORAL_CAPSULE | Freq: Two times a day (BID) | ORAL | Status: DC

## 2011-07-26 MED ORDER — METOPROLOL TARTRATE 25 MG PO TABS: 25.0000 mg | ORAL_TABLET | Freq: Two times a day (BID) | ORAL | Status: DC

## 2011-07-26 MED ORDER — AMIODARONE HCL 200 MG PO TABS: 200.0000 mg | ORAL_TABLET | Freq: Every day | ORAL | Status: DC

## 2011-07-26 MED ORDER — METOPROLOL TARTRATE 50 MG PO TABS: 25.0000 mg | ORAL_TABLET | Freq: Two times a day (BID) | ORAL | Status: DC

## 2011-07-26 NOTE — Patient Instructions (Addendum)
Your physician recommends that you schedule a follow-up appointment in: 4 weeks with Dr. Ladona Ridgel.  Start Amiodarone 200mg  daily.  Stop coumadin.  Start Pradaxa 150mg  every 12 hours on Monday.

## 2011-07-26 NOTE — Assessment & Plan Note (Signed)
At this point he is symptomatic and I have recommended we start amiodarone. Will also stop coumadin as he has been intolerant and start Pradaxa 150 mg twice daily.

## 2011-07-26 NOTE — Assessment & Plan Note (Signed)
His VT is not coming from the RVOT but from the LV. He was in VT when he came in today but was asymptomatic. He will be started on amiodarone 400 mg daily in divided doses.

## 2011-07-26 NOTE — Assessment & Plan Note (Signed)
His symptoms are class 2-3. Hopefully they will improve once we get him back to NSR. A low sodium diet is requested.
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
## 2011-08-22 ENCOUNTER — Ambulatory Visit (INDEPENDENT_AMBULATORY_CARE_PROVIDER_SITE_OTHER): Payer: Medicare Other | Admitting: Internal Medicine

## 2011-08-22 ENCOUNTER — Encounter (HOSPITAL_COMMUNITY): Payer: Self-pay | Admitting: Pharmacy Technician

## 2011-08-22 ENCOUNTER — Other Ambulatory Visit: Payer: Self-pay | Admitting: *Deleted

## 2011-08-22 ENCOUNTER — Encounter: Payer: Self-pay | Admitting: *Deleted

## 2011-08-22 ENCOUNTER — Encounter: Payer: Self-pay | Admitting: Internal Medicine

## 2011-08-22 VITALS — BP 120/80 | HR 64 | Ht 69.5 in | Wt 271.0 lb

## 2011-08-22 DIAGNOSIS — I472 Ventricular tachycardia, unspecified: Secondary | ICD-10-CM

## 2011-08-22 DIAGNOSIS — I4821 Permanent atrial fibrillation: Secondary | ICD-10-CM

## 2011-08-22 DIAGNOSIS — I4891 Unspecified atrial fibrillation: Secondary | ICD-10-CM

## 2011-08-22 DIAGNOSIS — I10 Essential (primary) hypertension: Secondary | ICD-10-CM

## 2011-08-22 LAB — CBC WITH DIFFERENTIAL/PLATELET
Basophils Absolute: 0.1 10*3/uL (ref 0.0–0.1)
Eosinophils Absolute: 0.3 10*3/uL (ref 0.0–0.7)
Eosinophils Relative: 3.5 % (ref 0.0–5.0)
HCT: 36.9 % — ABNORMAL LOW (ref 39.0–52.0)
Lymphs Abs: 1.1 10*3/uL (ref 0.7–4.0)
MCHC: 33.8 g/dL (ref 30.0–36.0)
MCV: 86.1 fl (ref 78.0–100.0)
Monocytes Absolute: 0.5 10*3/uL (ref 0.1–1.0)
Platelets: 276 10*3/uL (ref 150.0–400.0)
RDW: 18.4 % — ABNORMAL HIGH (ref 11.5–14.6)

## 2011-08-22 LAB — MAGNESIUM: Magnesium: 2.1 mg/dL (ref 1.5–2.5)

## 2011-08-22 LAB — BASIC METABOLIC PANEL
BUN: 13 mg/dL (ref 6–23)
Chloride: 107 mEq/L (ref 96–112)
Creatinine, Ser: 0.8 mg/dL (ref 0.4–1.5)
Glucose, Bld: 117 mg/dL — ABNORMAL HIGH (ref 70–99)
Potassium: 4.1 mEq/L (ref 3.5–5.1)

## 2011-08-22 NOTE — Assessment & Plan Note (Signed)
His blood pressure is well controlled today. He'll continue his current medical therapy.

## 2011-08-22 NOTE — Assessment & Plan Note (Signed)
He has had no recurrent ventricular arrhythmias. 

## 2011-08-22 NOTE — Assessment & Plan Note (Signed)
He remains out of rhythm. His ventricular rate is better controlled. He is been on amiodarone for a month. He is been anticoagulated. We will proceed with DC cardioversion tomorrow.
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
## 2011-08-22 NOTE — Patient Instructions (Signed)

## 2011-08-23 ENCOUNTER — Ambulatory Visit (HOSPITAL_COMMUNITY)
Admission: RE | Admit: 2011-08-23 | Discharge: 2011-08-23 | Disposition: A | Discharge status: Home / Self Care | Payer: Medicare Other | Source: Ambulatory Visit | Attending: Internal Medicine | Admitting: Internal Medicine

## 2011-08-23 ENCOUNTER — Encounter (HOSPITAL_COMMUNITY): Payer: Self-pay | Admitting: Certified Registered"

## 2011-08-23 ENCOUNTER — Encounter (HOSPITAL_COMMUNITY)
Admission: RE | Disposition: A | Discharge status: Home / Self Care | Payer: Self-pay | Source: Ambulatory Visit | Attending: Internal Medicine

## 2011-08-23 ENCOUNTER — Ambulatory Visit (HOSPITAL_COMMUNITY): Payer: Medicare Other | Admitting: Certified Registered"

## 2011-08-23 DIAGNOSIS — Z950 Presence of cardiac pacemaker: Secondary | ICD-10-CM | POA: Insufficient documentation

## 2011-08-23 DIAGNOSIS — I2510 Atherosclerotic heart disease of native coronary artery without angina pectoris: Secondary | ICD-10-CM | POA: Insufficient documentation

## 2011-08-23 DIAGNOSIS — I4891 Unspecified atrial fibrillation: Secondary | ICD-10-CM | POA: Insufficient documentation

## 2011-08-23 DIAGNOSIS — I10 Essential (primary) hypertension: Secondary | ICD-10-CM | POA: Insufficient documentation

## 2011-08-23 DIAGNOSIS — E119 Type 2 diabetes mellitus without complications: Secondary | ICD-10-CM | POA: Insufficient documentation

## 2011-08-23 DIAGNOSIS — I5030 Unspecified diastolic (congestive) heart failure: Secondary | ICD-10-CM | POA: Insufficient documentation

## 2011-08-23 DIAGNOSIS — R609 Edema, unspecified: Secondary | ICD-10-CM | POA: Insufficient documentation

## 2011-08-23 DIAGNOSIS — K219 Gastro-esophageal reflux disease without esophagitis: Secondary | ICD-10-CM | POA: Insufficient documentation

## 2011-08-23 DIAGNOSIS — I509 Heart failure, unspecified: Secondary | ICD-10-CM | POA: Insufficient documentation

## 2011-08-23 HISTORY — PX: CARDIOVERSION: SHX1299

## 2011-08-23 SURGERY — CARDIOVERSION
Anesthesia: Monitor Anesthesia Care | Wound class: Clean

## 2011-08-23 MED ORDER — SODIUM CHLORIDE 0.9 % IV SOLN
INTRAVENOUS | Status: DC | PRN
  Administered 2011-08-23: 12:00:00 via INTRAVENOUS

## 2011-08-23 MED ORDER — SODIUM CHLORIDE 0.9 % IV SOLN: 250.0000 mL | INTRAVENOUS | Status: DC

## 2011-08-23 MED ORDER — HYDROCORTISONE 1 % EX CREA
1.0000 "application " | TOPICAL_CREAM | Freq: Three times a day (TID) | CUTANEOUS | Status: DC | PRN
  Filled 2011-08-23: qty 28

## 2011-08-23 MED ORDER — SODIUM CHLORIDE 0.9 % IJ SOLN: 3.0000 mL | INTRAMUSCULAR | Status: DC | PRN

## 2011-08-23 MED ORDER — PROPOFOL 10 MG/ML IV EMUL
INTRAVENOUS | Status: DC | PRN
  Administered 2011-08-23: 90 mg via INTRAVENOUS

## 2011-08-23 MED ORDER — SODIUM CHLORIDE 0.9 % IJ SOLN: 3.0000 mL | Freq: Two times a day (BID) | INTRAMUSCULAR | Status: DC

## 2011-08-23 MED ORDER — LIDOCAINE HCL (CARDIAC) 20 MG/ML IV SOLN
INTRAVENOUS | Status: DC | PRN
  Administered 2011-08-23: 40 mg via INTRAVENOUS

## 2011-08-23 NOTE — Addendum Note (Signed)
Addendum  created 08/23/11 1301 by Judie Petit, MD   Modules edited:Anesthesia Attestations

## 2011-08-23 NOTE — Anesthesia Postprocedure Evaluation (Signed)
  Anesthesia Post-op Note  Patient: [name]  Procedure(s) Performed: Procedure(s) (LRB): CARDIOVERSION (N/A)  Patient Location: PACU and Short Stay  Anesthesia Type: General  Level of Consciousness: awake, alert  and oriented  Airway and Oxygen Therapy: Patient Spontanous Breathing  Post-op Pain: none  Post-op Assessment: Post-op Vital signs reviewed  Post-op Vital Signs: stable  Complications: No apparent anesthesia complications

## 2011-08-23 NOTE — Interval H&P Note (Signed)
History and Physical Interval Note:  08/23/2011 12:02 PM  [name]  has presented today for surgery, with the diagnosis of a-fib  The various methods of treatment have been discussed with the patient and family. After consideration of risks, benefits and other options for treatment, the patient has consented to  Procedure(s) (LRB): CARDIOVERSION (N/A) as a surgical intervention .  The patients' history has been reviewed, patient examined, no change in status, stable for surgery.  I have reviewed the patients' chart and labs.  Questions were answered to the patient's satisfaction.     [name]

## 2011-08-23 NOTE — Discharge Instructions (Signed)
General Anesthetic, Adult A doctor specialized in giving anesthesia (anesthesiologist) or a nurse specialized in giving anesthesia (nurse anesthetist) gives medicine that makes you sleep while a procedure is performed (general anesthetic). Once the general anesthetic has been administered, you will be in a sleeplike state in which you feel no pain. After having a general anestheticyou may feel:   Dizzy.   Weak.   Drowsy.   Confused.  These feelings are normal and can be expected to last for up to 24 hours after the procedure is completed.  LET YOUR CAREGIVER KNOW ABOUT:  Allergies you have.   Medications you are taking, including herbs, eye drops, over the counter medications, dietary supplements, and creams.   Previous problems you have had with anesthetics or numbing medicines.   Use of cigarettes, alcohol, or illicit drugs.   Possibility of pregnancy, if this applies.   History of bleeding or blood disorders, including blood clots and clotting disorders.   Previous surgeries you have had and types of anesthetics you have received.   Family medical history, especially anesthetic problems.   Other health problems.  BEFORE THE PROCEDURE  You may brush your teeth on the morning of surgery but you should have no solid food or non-clear liquids for a minimum of 8 hours prior to your procedure. Clear liquids (water, black coffee, and tea) are acceptable in small amounts until 2 hours prior to your procedure.   You may take your regular medications the morning of your procedure unless your caregiver indicates otherwise.  AFTER THE PROCEDURE  After surgery, you will be taken to the recovery area where a nurse will monitor your progress. You will be allowed to go home when you are awake, stable, taking fluids well, and without serious pain or complications.   For the first 24 hours following an anesthetic:   Have a responsible person with you.   Do not drive a car. If you are  alone, do not take public transportation.   Do not engage in strenuous activity. You may usually resume normal activities the next day, or as advised by your caregiver.   Do not drink alcohol.   Do not take medicine that has not been prescribed by your caregiver.   Do not sign important papers or make important decisions as your judgement may be impaired.   You may resume a normal diet as directed.   Change bandages (dressings) as directed.   Only take over-the-counter or prescription medicines for pain, discomfort, or fever as directed by your caregiver.  If you have questions or problems that seem related to the anesthetic, call the hospital and ask for the anesthetist, anesthesiologist, or anesthesia department. SEEK IMMEDIATE MEDICAL CARE IF:   You develop a rash.   You have difficulty breathing.   You have chest pain.   You have allergic problems.   You have uncontrolled nausea.   You have uncontrolled vomiting.   You develop any serious bleeding, especially from the incision site.  Document Released: 06/26/2007 Document Revised: 03/08/2011 Document Reviewed: 07/20/2010 [employer] Patient Information 2012 [employer]. 

## 2011-08-23 NOTE — Anesthesia Preprocedure Evaluation (Signed)
Anesthesia Evaluation  Patient identified by MRN, date of birth, ID band Patient awake    Reviewed: Allergy & Precautions, H&P , NPO status , Patient's Chart, lab work & pertinent test results  Airway       Dental   Pulmonary          Cardiovascular hypertension, Pt. on medications + CAD + dysrhythmias Atrial Fibrillation     Neuro/Psych    GI/Hepatic Neg liver ROS, GERD-  ,  Endo/Other  Diabetes mellitus-  Renal/GU negative Renal ROS     Musculoskeletal   Abdominal   Peds  Hematology   Anesthesia Other Findings   Reproductive/Obstetrics                           Anesthesia Physical Anesthesia Plan  ASA: II  Anesthesia Plan: MAC   Post-op Pain Management:    Induction: Intravenous  Airway Management Planned: Mask  Additional Equipment:   Intra-op Plan:   Post-operative Plan:   Informed Consent: I have reviewed the patients History and Physical, chart, labs and discussed the procedure including the risks, benefits and alternatives for the proposed anesthesia with the patient or authorized representative who has indicated his/her understanding and acceptance.     Plan Discussed with: CRNA  Anesthesia Plan Comments:         Anesthesia Quick Evaluation

## 2011-08-23 NOTE — Op Note (Signed)
EP Procedure Note  Procedure - DCCV [place] - symptomatic atrial fib  Findings - after informed consent obtained, patient prepped in the usual manner. The electrodispersive pad was placed in the antero-posterior position. The patient was sedated with Propafol under the direction of Dr. Randa Evens. 200 J of synchronized biphasic energy was delivered restoring NSR. The patient tolerated the procedure well. He was anti-coagulated with Pradaxa for 4 weeks prior to the procedure.  [name], M.D.
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
## 2011-08-23 NOTE — Preoperative (Signed)
Beta Blockers   Reason not to administer Beta Blockers:Not Applicable 

## 2011-08-23 NOTE — Transfer of Care (Signed)
Immediate Anesthesia Transfer of Care Note  Patient: [name]  Procedure(s) Performed: Procedure(s) (LRB): CARDIOVERSION (N/A)  Patient Location: PACU and Short Stay  Anesthesia Type: General  Level of Consciousness: awake, alert  and oriented  Airway & Oxygen Therapy: Patient Spontanous Breathing  Post-op Assessment: Report given to PACU RN  Post vital signs: stable  Complications: No apparent anesthesia complications

## 2011-08-28 ENCOUNTER — Encounter (HOSPITAL_COMMUNITY): Payer: Self-pay | Admitting: Internal Medicine

## 2011-09-06 ENCOUNTER — Telehealth: Payer: Self-pay | Admitting: Internal Medicine

## 2011-09-06 NOTE — Telephone Encounter (Signed)
Patient states since he had a DCCV on 08/23/11 his B/P is going up and down. Today in the AM B/P was 201/108 before taken his medication, some days has been low in the mornings, about 3:00 PM B/P was 148/77. Today he did not take his B/P in the AM, but about 1:00 PM B/P was 96/62, one hour later 119/58 now is 98/54. When his B/P is this low he gets light headed, and has some dizziness.

## 2011-09-06 NOTE — Telephone Encounter (Signed)
Pt's wife calling re pt's BP all over the place, has been high and low and doesn't feel good, pls call

## 2011-09-06 NOTE — Telephone Encounter (Signed)
Dr. Graciela Husbands DOD aware of patient's B/P issues and recommended for pt to take Metoprolol 50 mg !/2 tablet (25 mg) in the AM and 1  tablet (50 mg)in the  PM. Pt to call his PCP for B/P management. Patient aware, and  verbalized understanding.

## 2011-09-11 ENCOUNTER — Other Ambulatory Visit (INDEPENDENT_AMBULATORY_CARE_PROVIDER_SITE_OTHER): Payer: Medicare Other

## 2011-09-11 DIAGNOSIS — E119 Type 2 diabetes mellitus without complications: Secondary | ICD-10-CM

## 2011-09-14 ENCOUNTER — Ambulatory Visit (INDEPENDENT_AMBULATORY_CARE_PROVIDER_SITE_OTHER): Payer: Medicare Other | Admitting: Family Medicine

## 2011-09-14 ENCOUNTER — Encounter: Payer: Self-pay | Admitting: Family Medicine

## 2011-09-14 VITALS — BP 146/78 | HR 60 | Temp 97.8°F | Wt 270.0 lb

## 2011-09-14 DIAGNOSIS — E119 Type 2 diabetes mellitus without complications: Secondary | ICD-10-CM

## 2011-09-14 DIAGNOSIS — I10 Essential (primary) hypertension: Secondary | ICD-10-CM

## 2011-09-14 MED ORDER — METOPROLOL TARTRATE 25 MG PO TABS: ORAL_TABLET | ORAL | Status: DC

## 2011-09-14 NOTE — Patient Instructions (Signed)
25mg  of metoprolol in AM and 37.5mg  in PM.  If BP isn't controlled, notify me.   Recheck A1c and BP in 3 months.  Labs before visit.

## 2011-09-14 NOTE — Progress Notes (Signed)
Diabetes:  Using medications without difficulties: no meds.  Hypoglycemic episodes: no sx Hyperglycemic episodes: no sx Feet problems: no Blood Sugars averaging: not checked recently  A1c is improved to 5.5  He cut out cabs, white bread/rolls especially.  Down 14 lbs since last OV.   Hypertension:  S/p cardioversion.   BP has been higher in AM but closer to goal in afternoon taking 25mg  of metoprolol bid.   Chest pain with exertion: not since cardioversion Edema:no Short of breath:not since cardioversion.   PMH and SH reviewed  Meds, vitals, and allergies reviewed.   ROS: See HPI.  Otherwise negative.    GEN: nad, alert and oriented HEENT: mucous membranes moist NECK: supple w/o LA CV: rrr. PULM: ctab, no inc wob ABD: soft, +bs EXT: no edema SKIN: no acute rash  Diabetic foot exam: Normal inspection No skin breakdown No calluses  Normal DP pulses Normal sensation to light touch and monofilament Nails normal

## 2011-09-16 ENCOUNTER — Encounter: Payer: Self-pay | Admitting: Family Medicine

## 2011-09-16 NOTE — Assessment & Plan Note (Signed)
Not controlled, will inc to 25mg  in AM and 37.5mg  in PM of metoprolol. He agrees.  BP log reviewed.

## 2011-09-16 NOTE — Assessment & Plan Note (Signed)
Needs to continue weight loss. Some of this was fluid, some was from carb restriction.

## 2011-09-16 NOTE — Assessment & Plan Note (Signed)
Improve with diet adherence, needs to continue.  D/w pt and he understood.  >25 min spent with face to face with patient, >50% counseling and/or coordinating care.

## 2011-09-24 ENCOUNTER — Ambulatory Visit: Payer: Medicare Other | Admitting: Physician Assistant

## 2011-09-25 ENCOUNTER — Encounter: Payer: Self-pay | Admitting: Physician Assistant

## 2011-09-25 ENCOUNTER — Ambulatory Visit (INDEPENDENT_AMBULATORY_CARE_PROVIDER_SITE_OTHER): Payer: Medicare Other | Admitting: Physician Assistant

## 2011-09-25 ENCOUNTER — Other Ambulatory Visit (HOSPITAL_COMMUNITY): Payer: Self-pay | Admitting: Emergency Medicine

## 2011-09-25 VITALS — BP 140/90 | HR 59 | Ht 70.0 in | Wt 271.0 lb

## 2011-09-25 DIAGNOSIS — I472 Ventricular tachycardia: Secondary | ICD-10-CM

## 2011-09-25 DIAGNOSIS — I4891 Unspecified atrial fibrillation: Secondary | ICD-10-CM

## 2011-09-25 DIAGNOSIS — I5032 Chronic diastolic (congestive) heart failure: Secondary | ICD-10-CM

## 2011-09-25 DIAGNOSIS — R42 Dizziness and giddiness: Secondary | ICD-10-CM

## 2011-09-25 LAB — HEPATIC FUNCTION PANEL
AST: 21 U/L (ref 0–37)
Alkaline Phosphatase: 94 U/L (ref 39–117)
Total Bilirubin: 1.4 mg/dL — ABNORMAL HIGH (ref 0.3–1.2)

## 2011-09-25 LAB — TSH: TSH: 2.47 u[IU]/mL (ref 0.35–5.50)

## 2011-09-25 NOTE — Patient Instructions (Addendum)
NO CHANGES WERE MADE TODAY WITH YOUR MEDICATIONS   Your physician recommends that you return for lab work in: TODAY TSH, LFT  Your physician has recommended that you have a pulmonary function test W/DLCO TO BE DONE @ [facility]. Pulmonary Function Tests are a group of tests that measure how well air moves in and out of your lungs.   Your physician has recommended that you wear an event monitor DX A-FIB, H/O VT, LIGHTHEADEDNESS. Event monitors are medical devices that record the heart's electrical activity. Doctors most often [place] these monitors to diagnose arrhythmias. Arrhythmias are problems with the speed or rhythm of the heartbeat. The monitor is a small, portable device. You can wear one while you do your normal daily activities. This is usually used to diagnose what is causing palpitations/syncope (passing out).  Your physician recommends that you schedule a follow-up appointment in: 6-8 WEEKS WITH DR. Ladona Ridgel PER DR. Ladona Ridgel AND [address] [facility]
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
## 2011-10-02 ENCOUNTER — Ambulatory Visit (HOSPITAL_COMMUNITY)
Admission: RE | Admit: 2011-10-02 | Discharge: 2011-10-02 | Disposition: A | Discharge status: Home / Self Care | Payer: Medicare Other | Source: Ambulatory Visit | Attending: Internal Medicine | Admitting: Internal Medicine

## 2011-10-02 ENCOUNTER — Encounter (INDEPENDENT_AMBULATORY_CARE_PROVIDER_SITE_OTHER): Payer: Medicare Other

## 2011-10-02 ENCOUNTER — Telehealth: Payer: Self-pay | Admitting: Internal Medicine

## 2011-10-02 DIAGNOSIS — I4891 Unspecified atrial fibrillation: Secondary | ICD-10-CM | POA: Insufficient documentation

## 2011-10-02 DIAGNOSIS — Z79899 Other long term (current) drug therapy: Secondary | ICD-10-CM | POA: Insufficient documentation

## 2011-10-02 LAB — PULMONARY FUNCTION TEST

## 2011-10-02 MED ORDER — ALBUTEROL SULFATE (5 MG/ML) 0.5% IN NEBU
2.5000 mg | INHALATION_SOLUTION | Freq: Once | RESPIRATORY_TRACT | Status: AC
  Administered 2011-10-02: 2.5 mg via RESPIRATORY_TRACT

## 2011-10-02 NOTE — Telephone Encounter (Signed)
New Problem:    Called in to verify the order for the patient's PFT and wanted to know if it was ok to have him do a PFTDLCO and give him a bronchodilator because he is in Afib.  Please call back.

## 2011-10-02 NOTE — Telephone Encounter (Signed)
Spoke to Floyd at [facility] she is going to page Dr.Taylor she has several questions about the PFT order.

## 2011-10-24 ENCOUNTER — Telehealth: Payer: Self-pay | Admitting: Family Medicine

## 2011-10-24 NOTE — Telephone Encounter (Signed)
Patient is scheduled for a 30 minute appointment.

## 2011-10-24 NOTE — Telephone Encounter (Signed)
Please try to get for this.  Will see tomorrow.  Thanks.

## 2011-10-24 NOTE — Telephone Encounter (Signed)
Caller: Linda/Spouse; PCP: Sunnie Nielsen); CB#: [phone]; ; ; Call regarding Dizziness;   for the whole month of July he has at least 10 episodes of dizziness and many times has had hypotension with them  Had episode this AM and BP was 88/46 pulse is in mid 50's.  No chest pain   pressure now is 124/75.  All emergent sxs per Dizziness or Vertigo R/O except for dizziness not responding to home care   home care advice given and appt made for 10-25-11 at 1030 with Dr Para March

## 2011-10-25 ENCOUNTER — Ambulatory Visit (INDEPENDENT_AMBULATORY_CARE_PROVIDER_SITE_OTHER): Payer: Medicare Other | Admitting: Family Medicine

## 2011-10-25 ENCOUNTER — Encounter: Payer: Self-pay | Admitting: Family Medicine

## 2011-10-25 VITALS — BP 144/82 | HR 59 | Temp 97.5°F | Wt 272.0 lb

## 2011-10-25 DIAGNOSIS — I10 Essential (primary) hypertension: Secondary | ICD-10-CM

## 2011-10-25 DIAGNOSIS — I5032 Chronic diastolic (congestive) heart failure: Secondary | ICD-10-CM

## 2011-10-25 MED ORDER — FUROSEMIDE 40 MG PO TABS: ORAL_TABLET | ORAL | Status: DC

## 2011-10-25 NOTE — Patient Instructions (Addendum)
Cut back on the lasix to 10mg  a day and monitor weight.  If weight increased 3 lbs, then go back to 20mg  of lasix a day and notify us or cardiology.  See if the afternoon episodes are improved. Take care.

## 2011-10-25 NOTE — Assessment & Plan Note (Signed)
Cut back on the lasix to 10mg  a day and monitor weight.  If weight increased 3 lbs, then go back to 20mg  of lasix a day and notify us or cardiology.  Will see if the afternoon episodes are improved.

## 2011-10-25 NOTE — Progress Notes (Signed)
Toprol was adjusted and his AM pressures have been reasonable in the interval. He gets lightheaded and/or dizzy in the afternoon/evening, not in the AM.  He takes lasix in AM. He hasn't noted a correlation on hot days, but he's inside most of the time.  Weight has been stable. He has turned in cardiac monitor, awaiting report.  These episodes don't feel like a low sugar.  The episodes last a few minutes or longer, not just a few seconds.  Usually happens when standing after sitting for a period.  No CP.  No heart racing known to patient.  Occ with lower pulse rates noted.    Meds, vitals, and allergies reviewed.   ROS: See HPI.  Otherwise, noncontributory.  nad ncat Rrr, not tachy or irregular ctab abd soft, not ttp No edema

## 2011-10-30 ENCOUNTER — Telehealth: Payer: Self-pay | Admitting: *Deleted

## 2011-10-30 NOTE — Telephone Encounter (Signed)
Left message to call back -- PFT is normal -- repeat in 1 year ----- event monitor - NSR with some Bradycardia in the low 50's

## 2011-10-30 NOTE — Telephone Encounter (Signed)
Follow-up:    Patient returned your call.  Please call back. 

## 2011-10-31 NOTE — Telephone Encounter (Signed)
88/58 lowest BP and it was 107/63 yesterday --- he is aware of his monitor and PFT results but still worried about low BP

## 2011-11-01 NOTE — Telephone Encounter (Signed)
Discussed with Dr Ladona Ridgel and he has asked me to have the patient follow up with Dr Para March as I had suggested for his low BP.  Patient aware and will keep his follow up in Sept with Dr Ladona Ridgel

## 2011-11-01 NOTE — Telephone Encounter (Signed)
Talked to patient and let him know I would discuss his BP with Dr Ladona Ridgel  However, he has seen Dr Para March for this and Dr Para March adjusted(decreased) his Furosemide last week.  He has not followed up with him and has not seen any improvement.  I have encouraged him to call Dr Para March back as he just saw him for this last week.  He seems reluctant but will do so.  I let him know I will discuss with Dr Ladona Ridgel

## 2011-11-13 ENCOUNTER — Ambulatory Visit: Payer: Self-pay | Admitting: Ophthalmology

## 2011-11-13 DIAGNOSIS — I10 Essential (primary) hypertension: Secondary | ICD-10-CM

## 2011-11-19 ENCOUNTER — Ambulatory Visit: Payer: Self-pay | Admitting: Ophthalmology

## 2011-11-20 ENCOUNTER — Other Ambulatory Visit: Payer: Self-pay | Admitting: Internal Medicine

## 2011-12-07 ENCOUNTER — Other Ambulatory Visit (INDEPENDENT_AMBULATORY_CARE_PROVIDER_SITE_OTHER): Payer: Medicare Other

## 2011-12-07 DIAGNOSIS — E119 Type 2 diabetes mellitus without complications: Secondary | ICD-10-CM

## 2011-12-10 ENCOUNTER — Ambulatory Visit: Payer: Self-pay | Admitting: Ophthalmology

## 2011-12-10 ENCOUNTER — Other Ambulatory Visit: Payer: Medicare Other

## 2011-12-17 ENCOUNTER — Ambulatory Visit: Payer: Medicare Other | Admitting: Family Medicine

## 2011-12-17 ENCOUNTER — Ambulatory Visit (INDEPENDENT_AMBULATORY_CARE_PROVIDER_SITE_OTHER): Payer: Medicare Other | Admitting: Family Medicine

## 2011-12-17 ENCOUNTER — Encounter: Payer: Self-pay | Admitting: Family Medicine

## 2011-12-17 VITALS — BP 108/54 | HR 95 | Temp 97.5°F | Wt 276.8 lb

## 2011-12-17 DIAGNOSIS — I10 Essential (primary) hypertension: Secondary | ICD-10-CM

## 2011-12-17 DIAGNOSIS — Z23 Encounter for immunization: Secondary | ICD-10-CM

## 2011-12-17 DIAGNOSIS — E119 Type 2 diabetes mellitus without complications: Secondary | ICD-10-CM

## 2011-12-17 NOTE — Progress Notes (Signed)
Vision in improved after cataract surgery x2.  He's happy with result.  L lower lid bruising is improving.   Diabetes:  No DM2 meds Hypoglycemic episodes: no sx Hyperglycemic episodes: no sx Feet problems: no Blood Sugars averaging: not checked eye exam within last year: yes A1c 5.6  Hypertension:    Using medication without problems or lightheadedness: yes Chest pain with exertion:no Edema:no Short of breath:no Average home BPs: controlled on home checks Weight has been stable, in upper 260s usually on home scales when taking 20mg  of lasix per day.    PMH and SH reviewed  Meds, vitals, and allergies reviewed.   ROS: See HPI.  Otherwise negative.    GEN: nad, alert and oriented, overweight HEENT: mucous membranes moist, bruise on L lower lid noted NECK: supple w/o LA CV: rrr. PULM: ctab, no inc wob ABD: soft, +bs EXT: no edema SKIN: no acute rash  Diabetic foot exam: Normal inspection No skin breakdown No calluses  Normal DP pulses Normal sensation to light touch and monofilament Nails normal

## 2011-12-17 NOTE — Patient Instructions (Signed)
Recheck labs before a visit in 6 months.  Take care.  Call with concerns.

## 2011-12-18 NOTE — Assessment & Plan Note (Signed)
Controlled w/o meds, recheck in 6 months.  Continue work on diet and weight.

## 2011-12-18 NOTE — Assessment & Plan Note (Signed)
Controlled, likely at/near dry weight.  Continue as is.  Avoid salt.

## 2011-12-26 ENCOUNTER — Other Ambulatory Visit: Payer: Self-pay | Admitting: Internal Medicine

## 2011-12-27 ENCOUNTER — Encounter: Payer: Self-pay | Admitting: Internal Medicine

## 2011-12-27 ENCOUNTER — Ambulatory Visit (INDEPENDENT_AMBULATORY_CARE_PROVIDER_SITE_OTHER): Payer: Medicare Other | Admitting: Internal Medicine

## 2011-12-27 VITALS — BP 164/84 | HR 50 | Ht 70.0 in | Wt 276.0 lb

## 2011-12-27 DIAGNOSIS — I10 Essential (primary) hypertension: Secondary | ICD-10-CM

## 2011-12-27 DIAGNOSIS — G4730 Sleep apnea, unspecified: Secondary | ICD-10-CM

## 2011-12-27 DIAGNOSIS — R0609 Other forms of dyspnea: Secondary | ICD-10-CM

## 2011-12-27 DIAGNOSIS — I4891 Unspecified atrial fibrillation: Secondary | ICD-10-CM

## 2011-12-27 DIAGNOSIS — I4821 Permanent atrial fibrillation: Secondary | ICD-10-CM

## 2011-12-27 DIAGNOSIS — I509 Heart failure, unspecified: Secondary | ICD-10-CM

## 2011-12-27 DIAGNOSIS — R0683 Snoring: Secondary | ICD-10-CM

## 2011-12-27 MED ORDER — FUROSEMIDE 40 MG PO TABS: 20.0000 mg | ORAL_TABLET | Freq: Every day | ORAL | Status: DC

## 2011-12-27 MED ORDER — METOPROLOL TARTRATE 25 MG PO TABS: ORAL_TABLET | ORAL | Status: DC

## 2011-12-27 MED ORDER — DABIGATRAN ETEXILATE MESYLATE 150 MG PO CAPS: 150.0000 mg | ORAL_CAPSULE | Freq: Two times a day (BID) | ORAL | Status: DC

## 2011-12-27 MED ORDER — AMIODARONE HCL 200 MG PO TABS: 200.0000 mg | ORAL_TABLET | Freq: Every day | ORAL | Status: DC

## 2011-12-27 NOTE — Assessment & Plan Note (Signed)
He is maintaining sinus rhythm on amiodarone. We'll continue his current medical therapy. We will refer the patient for sleep eval.

## 2011-12-27 NOTE — Assessment & Plan Note (Signed)
His blood pressure is elevated today. He notes that he could just before he came in. At home, it typically is under better control. I discussed the importance of a low-sodium diet, weight loss, regular exercise, and we would consider up titration of his medications in the future.
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
## 2011-12-27 NOTE — Patient Instructions (Signed)
Your physician wants you to follow-up in: 6 months with Dr Court Joy will receive a reminder letter in the mail two months in advance. If you don't receive a letter, please call our office to schedule the follow-up appointment.   You have been referred to [facility]---   [facility] cardiac rehab( sent staff message to Ruth)---Will have someone call you

## 2011-12-28 ENCOUNTER — Telehealth: Payer: Self-pay | Admitting: Internal Medicine

## 2011-12-28 NOTE — Telephone Encounter (Signed)
Kim faxing office note and echo over to them

## 2011-12-28 NOTE — Telephone Encounter (Signed)
They need records faxed to them for the order you faxed over earlier today

## 2012-01-16 ENCOUNTER — Encounter: Payer: Self-pay | Admitting: Pulmonary Disease

## 2012-01-16 ENCOUNTER — Ambulatory Visit (INDEPENDENT_AMBULATORY_CARE_PROVIDER_SITE_OTHER): Payer: Medicare Other | Admitting: Pulmonary Disease

## 2012-01-16 VITALS — BP 130/78 | HR 50 | Ht 70.0 in | Wt 278.4 lb

## 2012-01-16 DIAGNOSIS — G4733 Obstructive sleep apnea (adult) (pediatric): Secondary | ICD-10-CM | POA: Insufficient documentation

## 2012-01-16 NOTE — Patient Instructions (Addendum)
Will schedule for sleep study, and will arrange followup once the results are available. Work on weight loss.  

## 2012-01-16 NOTE — Assessment & Plan Note (Signed)
The patient's history is suspicious for having obstructive sleep apnea.  He has classic symptoms at night, but surprisingly does not have issues with daytime alertness.  He certainly is at significant risk for having sleep apnea, and when combined with his underlying cardiac disease, I think sleep testing is very appropriate.  I had a long discussion with the patient about sleep apnea, including its impact to his quality of life and cardiovascular health.  He is agreeable to having a sleep study.

## 2012-01-16 NOTE — Progress Notes (Signed)
  Subjective:    Patient ID: [name], male    DOB: [birth year], 66 y.o.   MRN: [id number]  HPI The patient is a 66 year old male who been asked to see for possible obstructive sleep apnea.  He has been noted to have loud snoring by his wife, as well as witnessed apneas.  He has frequent awakenings at night, and does not feel rested in the mornings upon arising.  He feels that his alertness is adequate during the day, but does take a daily nap.  He has no issues in the evenings watching television or movies.  He denies any significant sleepiness with driving.  The patient states that his weight is down 10 pounds over the last one year, and his Epworth sleepiness score today is 8.  It should be noted the patient has a history of cardiac arrhythmias and congestive heart failure.  Sleep Questionnaire: What time do you typically go to bed?( Between what hours) 10- 11 pm How long does it take you to fall asleep? varies 15 mins to an hour How many times during the night do you wake up? What time do you get out of bed to start your day? Do you drive or operate heavy machinery in your occupation? No How much has your weight changed (up or down) over the past two years? (In pounds) 10 lb (4.536 kg) Have you ever had a sleep study before? No Do you currently use CPAP? No Do you wear oxygen at any time? No    Review of Systems  Constitutional: Negative for fever and unexpected weight change.  HENT: Positive for congestion and sneezing. Negative for ear pain, nosebleeds, sore throat, rhinorrhea, trouble swallowing, dental problem, postnasal drip and sinus pressure.   Eyes: Negative for redness and itching.  Respiratory: Positive for shortness of breath ( upon activity due to weight ). Negative for cough, chest tightness and wheezing.   Cardiovascular: Negative for palpitations and leg swelling.  Gastrointestinal: Negative for nausea and vomiting.  Genitourinary: Negative for dysuria.  Musculoskeletal:  Positive for joint swelling.  Skin: Negative for rash.  Neurological: Positive for headaches. Numbness:  sinus related   Hematological: Does not bruise/bleed easily.  Psychiatric/Behavioral: Positive for dysphoric mood. The patient is not nervous/anxious.        Objective:   Physical Exam Constitutional:  Obese male, no acute distress  HENT:  Nares patent without discharge, deviated septum to left with narrowing.   Oropharynx without exudate, palate and uvula are mildly elongated, large tongue.   Eyes:  Perrla, eomi, no scleral icterus  Neck:  No JVD, no TMG  Cardiovascular:  Normal rate, regular rhythm, no rubs or gallops.  2/6 sem        Intact distal pulses  Pulmonary :  Normal breath sounds, no stridor or respiratory distress   No rales, rhonchi, or wheezing  Abdominal:  Soft, nondistended, bowel sounds present.  No tenderness noted.   Musculoskeletal:  minimal lower extremity edema noted.  Lymph Nodes:  No cervical lymphadenopathy noted  Skin:  No cyanosis noted  Neurologic:  Alert, appropriate, moves all 4 extremities without obvious deficit.         Assessment & Plan:

## 2012-02-04 ENCOUNTER — Telehealth: Payer: Self-pay

## 2012-02-04 ENCOUNTER — Ambulatory Visit (HOSPITAL_BASED_OUTPATIENT_CLINIC_OR_DEPARTMENT_OTHER): Payer: Medicare Other | Attending: Pulmonary Disease | Admitting: Radiology

## 2012-02-04 VITALS — Ht 70.0 in | Wt 274.0 lb

## 2012-02-04 DIAGNOSIS — I4949 Other premature depolarization: Secondary | ICD-10-CM | POA: Insufficient documentation

## 2012-02-04 DIAGNOSIS — G4733 Obstructive sleep apnea (adult) (pediatric): Secondary | ICD-10-CM | POA: Insufficient documentation

## 2012-02-04 NOTE — Telephone Encounter (Signed)
Completed form faxed [phone] and sent for scanning.

## 2012-02-04 NOTE — Telephone Encounter (Signed)
[name] pulmonary rehab at [facility]; pt has appt 02/05/12 at 11:30 with dx CHF. Dr [name] cardiologist is not in office today and Lorene request Dr Para March to sign and fax back form for referral to pulmonary rehab. Form is in Dr Lianne Bushy in box.Please advise.

## 2012-02-04 NOTE — Telephone Encounter (Signed)
Signed, please send in.  Thanks.  

## 2012-02-05 ENCOUNTER — Encounter: Payer: Self-pay | Admitting: Internal Medicine

## 2012-02-20 ENCOUNTER — Telehealth: Payer: Self-pay | Admitting: Pulmonary Disease

## 2012-02-20 DIAGNOSIS — G4730 Sleep apnea, unspecified: Secondary | ICD-10-CM

## 2012-02-20 DIAGNOSIS — G4710 Hypersomnia, unspecified: Secondary | ICD-10-CM

## 2012-02-20 NOTE — Telephone Encounter (Signed)
Pt needs rov soon to review sleep study.

## 2012-02-20 NOTE — Procedures (Signed)
[name], [name] NO.:  [id number]  MEDICAL RECORD NO.:  [id number]          PATIENT TYPE:  OUT  LOCATION:  SLEEP CENTER                 FACILITY:  [facility]  PHYSICIAN:  Barbaraann Share, MD,FCCPDATE OF BIRTH:  1945-10-24  DATE OF STUDY:  02/04/2012                           NOCTURNAL POLYSOMNOGRAM  REFERRING PHYSICIAN:  Barbaraann Share, MD,FCCP  LOCATION:  Sleep Lab.  REFERRING PHYSICIAN:  Barbaraann Share, MD,FCCP  INDICATION FOR STUDY:  Hypersomnia with sleep apnea.  EPWORTH SLEEPINESS SCORE:  Seven.  SLEEP ARCHITECTURE:  The patient had a total sleep time of 253 minutes with no slow-wave sleep or REM.  Sleep onset latency was normal at 23 minutes, and sleep efficiency was poor at 69%.  RESPIRATORY DATA:  The patient was found to have 138 apneas and 246 obstructive hypopneas, giving him an apnea/hypopnea index of 91 events per hour.  The events occurred in all body positions and there was moderate snoring noted throughout.  OXYGEN DATA:  There was O2 desaturation as low as 78% with the patient's obstructive events.  CARDIAC DATA:  Frequent ectopic beats were noted, and it was very difficult to tell if these represented PACs, PVCs, or fusion beats. There may actually be a mixture of all 3.  MOVEMENT/PARASOMNIA:  The patient was noted to have no significant leg jerks or other abnormal behaviors.  IMPRESSION/RECOMMENDATION: 1. Very severe obstructive sleep apnea/hypopnea syndrome, with an AHI     of 91 events per hour and oxygen desaturation as low as 78%.     Treatment for this degree of sleep apnea should focus primarily on     CPAP while working on weight loss. 2. Frequent ectopic beats noted that may represent premature atrial     contractions, premature ventricular contractions, and/or fusion     beats.     Barbaraann Share, MD,FCCP Diplomate, American Board of Sleep Medicine    [facility]MED[facility]  D:  02/20/2012 08:35:36  T:  02/20/2012 20:40:31   Job:  [id number]

## 2012-02-21 NOTE — Telephone Encounter (Signed)
Pt has been scheduled for tues., 02/26/12 @ 430 w/KC to discuss sleep results.

## 2012-02-21 NOTE — Telephone Encounter (Signed)
LMTCB with pt's spouse. Needs appt with KC to discuss sleep results. Can use any open slot or any held 4:30 slot, except on Mondays.

## 2012-02-26 ENCOUNTER — Encounter: Payer: Self-pay | Admitting: Pulmonary Disease

## 2012-02-26 ENCOUNTER — Ambulatory Visit (INDEPENDENT_AMBULATORY_CARE_PROVIDER_SITE_OTHER): Payer: Medicare Other | Admitting: Pulmonary Disease

## 2012-02-26 VITALS — BP 112/68 | HR 51 | Temp 97.6°F | Ht 71.0 in | Wt 281.0 lb

## 2012-02-26 DIAGNOSIS — G4733 Obstructive sleep apnea (adult) (pediatric): Secondary | ICD-10-CM

## 2012-02-26 NOTE — Assessment & Plan Note (Signed)
The patient has very severe obstructive sleep apnea, and will greatly benefit from treatment with CPAP all he is working on weight loss.  He is agreeable to this approach. I will set the patient up on cpap at a moderate pressure level to allow for desensitization, and will troubleshoot the device over the next 4-6weeks if needed.  The pt is to call me if having issues with tolerance.  Will then optimize the pressure once patient is able to wear cpap on a consistent basis.

## 2012-02-26 NOTE — Patient Instructions (Addendum)
Will start on cpap at a moderate level.  Please call if having tolerance issues. Work on weight loss followup with me in 6 weeks.  

## 2012-02-26 NOTE — Progress Notes (Signed)
  Subjective:    Patient ID: [name], male    DOB: [birth year], 66 y.o.   MRN: [id number]  HPI The patient comes in today for followup of his recent sleep study.  He was found to have very severe obstructive sleep apnea, with an AHI of 91 events per hour and oxygen desaturation as low as 78%.  I have reviewed the study with him in detail, and answered all of his questions.   Review of Systems  Constitutional: Negative for fever and unexpected weight change.  HENT: Negative for ear pain, nosebleeds, congestion, sore throat, rhinorrhea, sneezing, trouble swallowing, dental problem, postnasal drip and sinus pressure.   Eyes: Negative for redness and itching.  Respiratory: Negative for cough, chest tightness, shortness of breath and wheezing.   Cardiovascular: Negative for palpitations and leg swelling.  Gastrointestinal: Negative for nausea and vomiting.  Genitourinary: Negative for dysuria.  Musculoskeletal: Negative for joint swelling.  Skin: Negative for rash.  Neurological: Negative for headaches.  Hematological: Does not bruise/bleed easily.  Psychiatric/Behavioral: Negative for dysphoric mood. The patient is not nervous/anxious.        Objective:   Physical Exam Obese male in no acute distress Nose without purulence or discharge noted Oropharynx clear Neck without lymphadenopathy or thyromegaly Lower extremities have mild edema, no cyanosis Alert and oriented, moves all 4 extremities.       Assessment & Plan:

## 2012-03-02 ENCOUNTER — Encounter: Payer: Self-pay | Admitting: Internal Medicine

## 2012-04-02 ENCOUNTER — Encounter: Payer: Self-pay | Admitting: Internal Medicine

## 2012-04-08 ENCOUNTER — Ambulatory Visit: Payer: Medicare Other | Admitting: Pulmonary Disease

## 2012-05-02 ENCOUNTER — Encounter: Payer: Self-pay | Admitting: Pulmonary Disease

## 2012-05-02 ENCOUNTER — Ambulatory Visit (INDEPENDENT_AMBULATORY_CARE_PROVIDER_SITE_OTHER): Payer: Medicare Other | Admitting: Pulmonary Disease

## 2012-05-02 VITALS — BP 128/60 | HR 50 | Temp 97.9°F | Ht 70.0 in | Wt 286.8 lb

## 2012-05-02 DIAGNOSIS — G4733 Obstructive sleep apnea (adult) (pediatric): Secondary | ICD-10-CM

## 2012-05-02 NOTE — Assessment & Plan Note (Signed)
The patient has severe obstructive sleep apnea, and is currently having issues with CPAP tolerance because of mask issues.  Will get him to the sleep center for a formal mask fitting, and will also need to optimize his pressure.  I have encouraged the patient to work aggressively on weight loss, and he is to call me if he continues to have problems with tolerance.

## 2012-05-02 NOTE — Progress Notes (Signed)
  Subjective:    Patient ID: [name], male    DOB: 02/05/1946, 67 y.o.   MRN: [id number]  HPI The patient comes in today for followup of his obstructive sleep apnea.  He has been trying to wear the CPAP compliantly, but has issues with mask fitting and leaks.  The tendon he has worn a mask, he does think his alertness during the day is better.  He denies having a pressure issue.   Review of Systems  Constitutional: Negative for fever and unexpected weight change.  HENT: Positive for congestion, rhinorrhea, sneezing, postnasal drip and sinus pressure. Negative for ear pain, nosebleeds, sore throat, trouble swallowing and dental problem.   Eyes: Negative for redness and itching.  Respiratory: Positive for cough. Negative for chest tightness, shortness of breath and wheezing.   Cardiovascular: Negative for palpitations and leg swelling.  Gastrointestinal: Negative for nausea and vomiting.  Genitourinary: Negative for dysuria.  Musculoskeletal: Negative for joint swelling.  Skin: Negative for rash.  Neurological: Positive for headaches.  Hematological: Does not bruise/bleed easily.  Psychiatric/Behavioral: Negative for dysphoric mood. The patient is not nervous/anxious.        Objective:   Physical Exam Overweight male in no acute distress Nose with no purulence or discharge noted No skin breakdown or pressure necrosis from the CPAP mask Lower extremities without edema, no cyanosis Alert and oriented, moves all 4 extremities.       Assessment & Plan:

## 2012-05-02 NOTE — Patient Instructions (Addendum)
Will do a fitting of different cpap masks over at the sleep center. Will have your cpap machine changed over to the automatic setting for the next 3 weeks in order to optimize your pressure.  I will let you know the results. Work on weight loss If doing well, will see you back in 6mos.  Please call if tolerance issues continue.

## 2012-05-06 ENCOUNTER — Ambulatory Visit (HOSPITAL_BASED_OUTPATIENT_CLINIC_OR_DEPARTMENT_OTHER): Payer: Medicare Other | Attending: Pulmonary Disease

## 2012-05-06 DIAGNOSIS — G4733 Obstructive sleep apnea (adult) (pediatric): Secondary | ICD-10-CM

## 2012-06-11 ENCOUNTER — Other Ambulatory Visit (INDEPENDENT_AMBULATORY_CARE_PROVIDER_SITE_OTHER): Payer: Medicare Other

## 2012-06-11 DIAGNOSIS — E119 Type 2 diabetes mellitus without complications: Secondary | ICD-10-CM

## 2012-06-11 LAB — LIPID PANEL
Cholesterol: 153 mg/dL (ref 0–200)
HDL: 27.2 mg/dL — ABNORMAL LOW (ref >39.00)
Triglycerides: 129 mg/dL (ref 0.0–149.0)
VLDL: 25.8 mg/dL (ref 0.0–40.0)

## 2012-06-11 LAB — COMPREHENSIVE METABOLIC PANEL
Alkaline Phosphatase: 92 U/L (ref 39–117)
BUN: 17 mg/dL (ref 6–23)
Glucose, Bld: 147 mg/dL — ABNORMAL HIGH (ref 70–99)
Total Bilirubin: 1.3 mg/dL — ABNORMAL HIGH (ref 0.3–1.2)

## 2012-06-11 LAB — HEMOGLOBIN A1C: Hgb A1c MFr Bld: 6.2 % (ref 4.6–6.5)

## 2012-06-17 ENCOUNTER — Ambulatory Visit: Payer: Medicare Other | Admitting: Family Medicine

## 2012-06-19 ENCOUNTER — Telehealth: Payer: Self-pay | Admitting: Pulmonary Disease

## 2012-06-19 NOTE — Telephone Encounter (Signed)
Spoke with Larita Fife at [facility] and she stated that [facility] did obtain Authorization from [employer]. That all [facility] needs is to obtain a current 30 day download off CPAP and submit to pt's insurance for purchase authorization. Called and spoke with patient and advised of the above. Pt given [facility] phone number [phone] to arrange for download. Pt to call [facility] to arrange. Nothing else needed. Rhonda J Cobb

## 2012-06-23 ENCOUNTER — Ambulatory Visit (INDEPENDENT_AMBULATORY_CARE_PROVIDER_SITE_OTHER): Payer: Medicare Other | Admitting: Family Medicine

## 2012-06-23 ENCOUNTER — Encounter: Payer: Self-pay | Admitting: Family Medicine

## 2012-06-23 VITALS — BP 144/80 | HR 53 | Temp 97.6°F | Wt 265.0 lb

## 2012-06-23 DIAGNOSIS — G4733 Obstructive sleep apnea (adult) (pediatric): Secondary | ICD-10-CM

## 2012-06-23 DIAGNOSIS — G5602 Carpal tunnel syndrome, left upper limb: Secondary | ICD-10-CM

## 2012-06-23 DIAGNOSIS — G5600 Carpal tunnel syndrome, unspecified upper limb: Secondary | ICD-10-CM

## 2012-06-23 DIAGNOSIS — E119 Type 2 diabetes mellitus without complications: Secondary | ICD-10-CM
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
## 2012-06-23 NOTE — Assessment & Plan Note (Signed)
Up from prev but still controlled A1c.  Continue to work on diet and weight.  He agrees.  Recheck later in 2014.

## 2012-06-23 NOTE — Assessment & Plan Note (Signed)
He's trying to work through this with pulm.

## 2012-06-23 NOTE — Patient Instructions (Addendum)
See Shirlee Limerick about your referral before you leave today. Don't change your meds and keep working on your weight.  Schedule a physical for about 6 months.

## 2012-06-23 NOTE — Assessment & Plan Note (Signed)
Continue bracing and refer to hand clinic.

## 2012-06-26 ENCOUNTER — Encounter: Payer: Self-pay | Admitting: Internal Medicine

## 2012-06-26 ENCOUNTER — Ambulatory Visit (INDEPENDENT_AMBULATORY_CARE_PROVIDER_SITE_OTHER): Payer: Medicare Other | Admitting: Internal Medicine

## 2012-06-26 VITALS — BP 147/72 | HR 53 | Ht 70.0 in | Wt 284.8 lb

## 2012-06-26 DIAGNOSIS — I5032 Chronic diastolic (congestive) heart failure: Secondary | ICD-10-CM

## 2012-06-26 DIAGNOSIS — Z7901 Long term (current) use of anticoagulants: Secondary | ICD-10-CM

## 2012-06-26 DIAGNOSIS — I4891 Unspecified atrial fibrillation: Secondary | ICD-10-CM

## 2012-06-26 DIAGNOSIS — I4819 Other persistent atrial fibrillation: Secondary | ICD-10-CM

## 2012-06-26 MED ORDER — RIVAROXABAN 20 MG PO TABS: 20.0000 mg | ORAL_TABLET | Freq: Every day | ORAL | Status: DC
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
## 2012-06-26 NOTE — Assessment & Plan Note (Signed)
The patient is maintaining sinus rhythm. He will continue his low-dose amiodarone therapy. We'll plan to recheck in several months.

## 2012-06-26 NOTE — Patient Instructions (Addendum)
Your physician has recommended you make the following change in your medication:  1) Take your last dose of Pradaxa tonight.  2) No Pradaxa in the morning. 3) Start Xarelto 20 mg one tablet every evening with a large meal.  Your physician wants you to follow-up in: 6 months with Dr. Ladona Ridgel. You will receive a reminder letter in the mail two months in advance. If you don't receive a letter, please call our office to schedule the follow-up appointment.

## 2012-06-26 NOTE — Assessment & Plan Note (Signed)
His symptoms remain class II. I've encouraged him to continue to try and lose weight, and maintain a low-sodium diet. He will continue his current medical therapy.

## 2012-06-26 NOTE — Assessment & Plan Note (Signed)
The patient remains on anticoagulation with Pradaxa. He unfortunately has had significant gas on this medication. I have offered him the potential to switch medications, and he will start Xarelto. If his gas does not improve, then he will switch back to Pradaxa.

## 2012-07-27 ENCOUNTER — Other Ambulatory Visit: Payer: Self-pay | Admitting: Pulmonary Disease

## 2012-07-27 DIAGNOSIS — G4733 Obstructive sleep apnea (adult) (pediatric): Secondary | ICD-10-CM

## 2012-08-01 ENCOUNTER — Encounter: Payer: Self-pay | Admitting: Pulmonary Disease

## 2012-08-01 ENCOUNTER — Ambulatory Visit (INDEPENDENT_AMBULATORY_CARE_PROVIDER_SITE_OTHER): Payer: Medicare Other | Admitting: Pulmonary Disease

## 2012-08-01 VITALS — BP 138/78 | HR 46 | Temp 97.2°F | Ht 70.0 in | Wt 287.2 lb

## 2012-08-01 DIAGNOSIS — G4733 Obstructive sleep apnea (adult) (pediatric): Secondary | ICD-10-CM

## 2012-08-01 NOTE — Patient Instructions (Addendum)
Would like to see how you do on a fixed pressure at optimal level before changing treatment for your sleep apnea.  I would like you to call me in 2-4 weeks after being on the new fixed pressure.  Will then decide how to proceed. Work on weight loss

## 2012-08-01 NOTE — Progress Notes (Signed)
  Subjective:    Patient ID: [name], male    DOB: 1945-08-10, 67 y.o.   MRN: [id number]  HPI The patient comes in today for followup of his obstructive sleep apnea.  He has been on CPAP on the automatic setting, but is awaiting a change to a fixed pressure of 15 cm as optimized by download.  The patient states that he has been miserable since being on CPAP, but denies any issues with mask fit or leaking.  He has no issues with pressure.  His problem is with the overall CPAP experience, and he is not able to stick with this compliantly over the long-term unless something is changed.  The overall experience and inconvenience is frustrating him.   Review of Systems  Constitutional: Positive for fatigue. Negative for fever and unexpected weight change.  HENT: Positive for congestion, rhinorrhea, sneezing, postnasal drip and sinus pressure. Negative for ear pain, nosebleeds, sore throat, trouble swallowing and dental problem.        Allergies   Eyes: Negative for redness and itching.  Respiratory: Positive for shortness of breath. Negative for cough, chest tightness and wheezing.   Cardiovascular: Negative for palpitations and leg swelling.  Gastrointestinal: Negative for nausea and vomiting.  Genitourinary: Negative for dysuria.  Musculoskeletal: Negative for joint swelling.  Skin: Positive for rash ( psoriasis).  Neurological: Positive for headaches.  Hematological: Does not bruise/bleed easily.  Psychiatric/Behavioral: Positive for sleep disturbance ( discomfort/not tolerable/difficulty going to sleep--waking 4-5 x nightly). Negative for dysphoric mood. The patient is not nervous/anxious.        Objective:   Physical Exam Morbidly obese male in no acute distress No skin breakdown or pressure necrosis from the CPAP mask Nose without purulence or discharge noted Neck without lymphadenopathy or thyromegaly Lower extremities with mild edema, no cyanosis Alert and oriented, does not  appear to be sleepy, moves all 4 extremities.       Assessment & Plan:

## 2012-08-01 NOTE — Assessment & Plan Note (Signed)
The patient is having very poor tolerance of CPAP currently on the automatic setting.  It is not specifically directed at the mask or the pressure, but the overall experience of wearing CPAP.  This is usually very difficult to try and improve, and is associated with a high failure rate.  I have convinced him to try CPAP a little longer on a fixed optimal pressure, and he is to call me in a few weeks with his response.  If he is continuing to have issues with tolerance, I would consider referring him for a dental appliance, and he may also need nocturnal oxygen.  Finally, I have encouraged the patient to work aggressively on weight loss.

## 2012-08-12 ENCOUNTER — Other Ambulatory Visit: Payer: Self-pay

## 2012-08-12 ENCOUNTER — Other Ambulatory Visit: Payer: Self-pay | Admitting: Internal Medicine

## 2012-08-12 DIAGNOSIS — I4891 Unspecified atrial fibrillation: Secondary | ICD-10-CM

## 2012-08-12 NOTE — Telephone Encounter (Signed)
..   Requested Prescriptions   Signed Prescriptions Disp Refills  . PRADAXA 150 MG CAPS 60 capsule 6    Sig: TAKE ONE CAPSULE BY MOUTH EVERY 12 HOURS    Authorizing Provider: Marinus Maw    Ordering User: Christella Hartigan, Katara Griner M  Per Nicholaus Bloom

## 2012-08-12 NOTE — Telephone Encounter (Signed)
Patient stopped taking xarelto 20 mg and started back on pradaxa 150 mg

## 2012-08-12 NOTE — Telephone Encounter (Signed)
Could you please look at this not sure if patient is taking this med, it is not on the med list

## 2012-10-13 ENCOUNTER — Telehealth: Payer: Self-pay | Admitting: Internal Medicine

## 2012-10-13 DIAGNOSIS — I4891 Unspecified atrial fibrillation: Secondary | ICD-10-CM

## 2012-10-13 NOTE — Telephone Encounter (Signed)
New problem  Pt states his heart seems to have gone out of rhythm.

## 2012-10-14 ENCOUNTER — Encounter: Payer: Self-pay | Admitting: Pulmonary Disease

## 2012-10-14 ENCOUNTER — Ambulatory Visit (INDEPENDENT_AMBULATORY_CARE_PROVIDER_SITE_OTHER): Payer: Self-pay | Admitting: Pulmonary Disease

## 2012-10-14 VITALS — BP 128/94 | HR 80 | Temp 97.9°F | Ht 70.0 in | Wt 286.9 lb

## 2012-10-14 DIAGNOSIS — G4733 Obstructive sleep apnea (adult) (pediatric): Secondary | ICD-10-CM

## 2012-10-14 NOTE — Telephone Encounter (Addendum)
Last several weeks has noticed his heart out of rhythm on several occasions.  His HR is running around 70.  I have discussed with Dr Ladona Ridgel and we are going to increase his Amiodarone to 300mg  Mon-Fri and 200mg  Sat and Sun.  I have left patient a message with this on his voice mail

## 2012-10-14 NOTE — Assessment & Plan Note (Signed)
The patient continues to struggle with CPAP therapy, but wishes to continue trying.  I would like to see if we can get his mask that fits him well, and if it's been discontinued he will need to look at a different type.  Despite wearing his CPAP compliantly prior to the mask leaking episodes, he has not seen a significant improvement in his sleep or daytime alertness.  He has very severe disease, and should see improvement with CPAP.  He is using a nasal mask, but denies mouth opening.  At this point, I would like to get a download off his machine to see if we are adequately controlling his events.  The other option is to do a followup titration, but he does not tolerate the automatic device very well.  This only leads to option of returning to the sleep center for formal CPAP titration.  I would like to see what his download shows prior to doing this.  Finally, I've encouraged the patient to work aggressively on weight loss.

## 2012-10-14 NOTE — Patient Instructions (Addendum)
Will find out from your DME if we can get you the same mask you have worn in the past that worked for you. Will get a download off your machine to see if your current pressure is even controlling your sleep apnea.  Continue working on weight loss Will contact you once I receive your download.

## 2012-10-14 NOTE — Progress Notes (Signed)
  Subjective:    Patient ID: [name], male    DOB: [birth year], 67 y.o.   MRN: [id number]  HPI The patient comes in today for followup of his severe obstructive sleep apnea.  He has had ongoing issues with CPAP tolerance, and has had his CPAP pressure optimized to 15 cm of water.  He found a mass that gets fairly well, but his current medical equipment company he is unable to give him new supplies.  It is unclear whether they have to order the mask, or whether they no longer carry it.  Because the patient has not been able to wear his mask because of leaks, he has not worn CPAP the last 5 weeks.  Prior to that, he states that he was compliant with the device, but did not see that much improvement in his sleep or daytime alertness.   Review of Systems  Constitutional: Negative for fever and unexpected weight change.  HENT: Positive for congestion, sneezing and sinus pressure. Negative for ear pain, nosebleeds, sore throat, rhinorrhea, trouble swallowing, dental problem and postnasal drip.        Sinus trouble x 1.5 months  Eyes: Negative for redness and itching.  Respiratory: Negative for cough, chest tightness, shortness of breath and wheezing.   Cardiovascular: Negative for palpitations and leg swelling.  Gastrointestinal: Negative for nausea and vomiting.  Genitourinary: Negative for dysuria.  Musculoskeletal: Negative for joint swelling.  Skin: Negative for rash.  Neurological: Positive for headaches.  Hematological: Does not bruise/bleed easily.  Psychiatric/Behavioral: Negative for dysphoric mood. The patient is not nervous/anxious.        Objective:   Physical Exam Obese male no acute distress Nose with purulent discharge noted Oropharynx clear Neck without lymphadenopathy or thyromegaly No skin breakdown or pressure necrosis from the CPAP mask Lower extremities with mild edema, no cyanosis Alert, does not appear to be overly sleepy, moves all 4 extremities.        Assessment & Plan:

## 2012-10-14 NOTE — Telephone Encounter (Signed)
Follow up  Pt is calling to speak with you regarding his heart.

## 2012-10-15 MED ORDER — AMIODARONE HCL 200 MG PO TABS: ORAL_TABLET | ORAL | Status: DC

## 2012-10-15 NOTE — Telephone Encounter (Signed)
Patient aware and will increase Amiodarone

## 2012-12-21 ENCOUNTER — Other Ambulatory Visit: Payer: Self-pay | Admitting: Family Medicine

## 2012-12-21 DIAGNOSIS — E119 Type 2 diabetes mellitus without complications: Secondary | ICD-10-CM

## 2012-12-22 ENCOUNTER — Other Ambulatory Visit (INDEPENDENT_AMBULATORY_CARE_PROVIDER_SITE_OTHER): Payer: Medicare Other

## 2012-12-22 DIAGNOSIS — E119 Type 2 diabetes mellitus without complications: Secondary | ICD-10-CM

## 2012-12-22 LAB — LIPID PANEL
LDL Cholesterol: 122 mg/dL — ABNORMAL HIGH (ref 0–99)
Total CHOL/HDL Ratio: 6
Triglycerides: 155 mg/dL — ABNORMAL HIGH (ref 0.0–149.0)
VLDL: 31 mg/dL (ref 0.0–40.0)

## 2012-12-22 LAB — HEMOGLOBIN A1C: Hgb A1c MFr Bld: 6.6 % — ABNORMAL HIGH (ref 4.6–6.5)

## 2012-12-29 ENCOUNTER — Encounter: Payer: Self-pay | Admitting: Family Medicine

## 2012-12-29 ENCOUNTER — Ambulatory Visit (INDEPENDENT_AMBULATORY_CARE_PROVIDER_SITE_OTHER): Payer: Medicare Other | Admitting: Family Medicine

## 2012-12-29 VITALS — BP 144/84 | HR 80 | Temp 97.5°F | Ht 70.0 in | Wt 283.5 lb

## 2012-12-29 DIAGNOSIS — R82998 Other abnormal findings in urine: Secondary | ICD-10-CM

## 2012-12-29 DIAGNOSIS — R21 Rash and other nonspecific skin eruption: Secondary | ICD-10-CM

## 2012-12-29 DIAGNOSIS — I10 Essential (primary) hypertension: Secondary | ICD-10-CM

## 2012-12-29 DIAGNOSIS — R319 Hematuria, unspecified: Secondary | ICD-10-CM

## 2012-12-29 DIAGNOSIS — I4892 Unspecified atrial flutter: Secondary | ICD-10-CM

## 2012-12-29 DIAGNOSIS — Z1211 Encounter for screening for malignant neoplasm of colon: Secondary | ICD-10-CM

## 2012-12-29 DIAGNOSIS — Z23 Encounter for immunization: Secondary | ICD-10-CM

## 2012-12-29 DIAGNOSIS — Z0000 Encounter for general adult medical examination without abnormal findings: Secondary | ICD-10-CM

## 2012-12-29 DIAGNOSIS — E119 Type 2 diabetes mellitus without complications: Secondary | ICD-10-CM

## 2012-12-29 DIAGNOSIS — R3989 Other symptoms and signs involving the genitourinary system: Secondary | ICD-10-CM

## 2012-12-29 LAB — POCT URINALYSIS DIPSTICK
Glucose, UA: NEGATIVE
Leukocytes, UA: NEGATIVE
Nitrite, UA: NEGATIVE
Urobilinogen, UA: 0.2
pH, UA: 7.5

## 2012-12-29 MED ORDER — FUROSEMIDE 20 MG PO TABS: 10.0000 mg | ORAL_TABLET | Freq: Two times a day (BID) | ORAL | Status: DC

## 2012-12-29 NOTE — Patient Instructions (Addendum)
[name] will call about your referral. Go to the lab on the way out.  We'll contact you with your lab report. Check with your insurance to see if they will cover the shingles shot. I would check on getting a tetanus shot at the health department in 2015. Take the furosemide 10mg  twice a day.  See if that helps.  If you continue to have troubles in the afternoon, then let me know.  Recheck A1c before a visit in about 6 months.  Take care.

## 2012-12-29 NOTE — Progress Notes (Signed)
I have personally reviewed the Medicare Annual Wellness questionnaire and have noted 1. The patient's medical and social history 2. Their use of alcohol, tobacco or illicit drugs 3. Their current medications and supplements 4. The patient's functional ability including ADL's, fall risks, home safety risks and hearing or visual             impairment. 5. Diet and physical activities 6. Evidence for depression or mood disorders  The patients weight, height, BMI have been recorded in the chart and visual acuity is per eye clinic.  I have made referrals, counseling and provided education to the patient based review of the above and I have provided the pt with a written personalized care plan for preventive services.  See scanned forms.  Routine anticipatory guidance given to patient.  See health maintenance. Flu 2014 Shingles encouraged PNA 2014 Tetanus 2005 D/w patient [id number] for colon cancer screening, including IFOB vs. colonoscopy.  Risks and benefits of both were discussed and patient voiced understanding.  Pt elects [id number] Prostate cancer screening and PSA options (with potential risks and benefits of testing vs not testing) were discussed along with recent recs/guidelines.  He declined testing PSA at this point. Advance directive done, wife designated.  Cognitive function addressed- see scanned forms- and if abnormal then additional documentation follows.   Wrist pain improved after injection.  Doing well now with pain.  Rash noted on R lower shin and concern for psoriatic involvement prev d/w pt by ortho (per patient report)  DM2.  No sx of lows, highs; not checked at home.  Labs d/w pt.  Diet encouraged. Limited exercise with fatigue.   H/o AF, occ tachycardia, improved from previous.  No CP.  Not SOB unless he has an episode of tachycardia.  Compliant with meds.  He has hypotension documented in the afternoons, after AM lasix use.  Weight stable.    Dark urine recently noted.   On pradaxa.  No pain with urination.    PMH and SH reviewed  Meds, vitals, and allergies reviewed.   ROS: See HPI.  Otherwise negative.    GEN: nad, alert and oriented HEENT: mucous membranes moist NECK: supple w/o LA CV: rrr. PULM: ctab, no inc wob ABD: soft, +bs EXT: no edema SKIN: no acute rash but chronic rash noted on R lower shin  Diabetic foot exam: Normal inspection No skin breakdown No calluses  Normal DP pulses Normal sensation to light touch and monofilament Nails normal

## 2012-12-30 DIAGNOSIS — L989 Disorder of the skin and subcutaneous tissue, unspecified: Secondary | ICD-10-CM | POA: Insufficient documentation

## 2012-12-30 DIAGNOSIS — R319 Hematuria, unspecified: Secondary | ICD-10-CM | POA: Insufficient documentation

## 2012-12-30 DIAGNOSIS — Z0000 Encounter for general adult medical examination without abnormal findings: Secondary | ICD-10-CM | POA: Insufficient documentation

## 2012-12-30 NOTE — Assessment & Plan Note (Signed)
Refer to derm  ?

## 2012-12-30 NOTE — Assessment & Plan Note (Signed)
He'll work on diet and recheck A1c in about 6 months.  He agrees.

## 2012-12-30 NOTE — Assessment & Plan Note (Signed)
Change lasix to 10mg  BID and he'll update me on his afternoon sx.  Continue other meds as is.

## 2012-12-30 NOTE — Assessment & Plan Note (Signed)
See scanned forms.  Routine anticipatory guidance given to patient.  See health maintenance. Flu 2014 Shingles encouraged PNA 2014 Tetanus 2005 D/w patient [id number] for colon cancer screening, including IFOB vs. colonoscopy.  Risks and benefits of both were discussed and patient voiced understanding.  Pt elects [id number] Prostate cancer screening and PSA options (with potential risks and benefits of testing vs not testing) were discussed along with recent recs/guidelines.  He declined testing PSA at this point. Advance directive done, wife designated.  Cognitive function addressed- see scanned forms- and if abnormal then additional documentation follows.

## 2012-12-30 NOTE — Assessment & Plan Note (Signed)
Refer to uro given the pradaxa.

## 2012-12-30 NOTE — Assessment & Plan Note (Signed)
Continue current meds, with the exception of the lasix change.  Labs d/w pt.

## 2013-01-07 ENCOUNTER — Telehealth: Payer: Self-pay | Admitting: Internal Medicine

## 2013-01-07 NOTE — Telephone Encounter (Signed)
I spoke with Dr Ladona Ridgel and he would like the pt to continue Pradaxa and keep appointment with Urology.  Samples placed at the front desk.

## 2013-01-07 NOTE — Telephone Encounter (Signed)
I spoke with the pt and he saw Dr Para March and had a UA performed on 12/29/12.  This did show blood (3+) in the pt's urine and he is scheduled to see Dr Isabel Caprice in Urology on 01/13/13.  The pt said he has enough Pradaxa to last until Monday and does not want to pick up a new Rx if this medication may be stopped by Dr Isabel Caprice or Dr Ladona Ridgel.  The pt is scheduled to see Dr Ladona Ridgel on 01/16/13.  The pt would like samples of Pradaxa. The pt does still have blood in his urine.

## 2013-01-07 NOTE — Telephone Encounter (Signed)
New problem   Pt needs a call back about blood in his urine found by PCP.

## 2013-01-16 ENCOUNTER — Encounter: Payer: Self-pay | Admitting: Internal Medicine

## 2013-01-16 ENCOUNTER — Encounter: Payer: Self-pay | Admitting: *Deleted

## 2013-01-16 ENCOUNTER — Ambulatory Visit (INDEPENDENT_AMBULATORY_CARE_PROVIDER_SITE_OTHER): Payer: Medicare Other | Admitting: Internal Medicine

## 2013-01-16 VITALS — BP 140/87 | HR 64 | Ht 70.0 in | Wt 284.1 lb

## 2013-01-16 DIAGNOSIS — I5032 Chronic diastolic (congestive) heart failure: Secondary | ICD-10-CM

## 2013-01-16 DIAGNOSIS — I4891 Unspecified atrial fibrillation: Secondary | ICD-10-CM

## 2013-01-16 DIAGNOSIS — I4819 Other persistent atrial fibrillation: Secondary | ICD-10-CM

## 2013-01-16 LAB — CBC WITH DIFFERENTIAL/PLATELET
Basophils Relative: 0.6 % (ref 0.0–3.0)
Eosinophils Relative: 4.4 % (ref 0.0–5.0)
Hemoglobin: 13 g/dL (ref 13.0–17.0)
Lymphocytes Relative: 18.8 % (ref 12.0–46.0)
MCV: 87.7 fl (ref 78.0–100.0)
Neutrophils Relative %: 69.4 % (ref 43.0–77.0)
RBC: 4.28 Mil/uL (ref 4.22–5.81)
WBC: 8 10*3/uL (ref 4.5–10.5)

## 2013-01-16 LAB — MAGNESIUM: Magnesium: 1.9 mg/dL (ref 1.5–2.5)

## 2013-01-16 LAB — BASIC METABOLIC PANEL
Calcium: 8.9 mg/dL (ref 8.4–10.5)
Creatinine, Ser: 0.8 mg/dL (ref 0.4–1.5)
GFR: 101.18 mL/min (ref >60.00)

## 2013-01-16 MED ORDER — AMIODARONE HCL 200 MG PO TABS: 200.0000 mg | ORAL_TABLET | Freq: Two times a day (BID) | ORAL | Status: DC

## 2013-01-16 MED ORDER — DABIGATRAN ETEXILATE MESYLATE 150 MG PO CAPS: 150.0000 mg | ORAL_CAPSULE | Freq: Two times a day (BID) | ORAL | Status: DC
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
## 2013-01-16 NOTE — Patient Instructions (Signed)
Your physician has recommended that you have a Cardioversion (DCCV). Electrical Cardioversion uses a jolt of electricity to your heart either through paddles or wired patches attached to your chest. This is a controlled, usually prescheduled, procedure. Defibrillation is done under light anesthesia in the hospital, and you usually go home the day of the procedure. This is done to get your heart back into a normal rhythm. You are not awake for the procedure. Please see the instruction sheet given to you today.   Your physician has recommended you make the following change in your medication:  1) Increase Amiodarone to 200mg  twice daily

## 2013-01-18 ENCOUNTER — Encounter: Payer: Self-pay | Admitting: Internal Medicine

## 2013-01-18 NOTE — Assessment & Plan Note (Signed)
The patient now has persistent atrial fibrillation/left atrial flutter. I've discussed the treatment options with the patient. I've recommended up titration of his amiodarone therapy, with anticipated DC cardioversion in approximately 4-6 weeks. No other change in medical therapy.

## 2013-01-18 NOTE — Assessment & Plan Note (Signed)
His heart failure symptoms in atrial fibrillation are class III. I've instructed the patient to reduce his sodium intake. He'll continue his other heart failure medications.

## 2013-01-22 ENCOUNTER — Encounter (HOSPITAL_COMMUNITY): Payer: Medicare Other | Admitting: Anesthesiology

## 2013-01-22 ENCOUNTER — Encounter (HOSPITAL_COMMUNITY)
Admission: RE | Disposition: A | Discharge status: Home / Self Care | Payer: Self-pay | Source: Ambulatory Visit | Attending: Internal Medicine

## 2013-01-22 ENCOUNTER — Encounter (HOSPITAL_COMMUNITY): Payer: Self-pay

## 2013-01-22 ENCOUNTER — Ambulatory Visit (HOSPITAL_COMMUNITY): Payer: Medicare Other | Admitting: Anesthesiology

## 2013-01-22 ENCOUNTER — Ambulatory Visit (HOSPITAL_COMMUNITY)
Admission: RE | Admit: 2013-01-22 | Discharge: 2013-01-22 | Disposition: A | Discharge status: Home / Self Care | Payer: Medicare Other | Source: Ambulatory Visit | Attending: Internal Medicine | Admitting: Internal Medicine

## 2013-01-22 DIAGNOSIS — G4733 Obstructive sleep apnea (adult) (pediatric): Secondary | ICD-10-CM

## 2013-01-22 DIAGNOSIS — I4891 Unspecified atrial fibrillation: Secondary | ICD-10-CM | POA: Insufficient documentation

## 2013-01-22 DIAGNOSIS — I4819 Other persistent atrial fibrillation: Secondary | ICD-10-CM

## 2013-01-22 DIAGNOSIS — Z7901 Long term (current) use of anticoagulants: Secondary | ICD-10-CM | POA: Insufficient documentation

## 2013-01-22 DIAGNOSIS — R319 Hematuria, unspecified: Secondary | ICD-10-CM

## 2013-01-22 DIAGNOSIS — I2510 Atherosclerotic heart disease of native coronary artery without angina pectoris: Secondary | ICD-10-CM | POA: Insufficient documentation

## 2013-01-22 DIAGNOSIS — I5032 Chronic diastolic (congestive) heart failure: Secondary | ICD-10-CM

## 2013-01-22 DIAGNOSIS — Z79899 Other long term (current) drug therapy: Secondary | ICD-10-CM | POA: Insufficient documentation

## 2013-01-22 DIAGNOSIS — R21 Rash and other nonspecific skin eruption: Secondary | ICD-10-CM

## 2013-01-22 DIAGNOSIS — I10 Essential (primary) hypertension: Secondary | ICD-10-CM

## 2013-01-22 DIAGNOSIS — E119 Type 2 diabetes mellitus without complications: Secondary | ICD-10-CM

## 2013-01-22 DIAGNOSIS — Z0000 Encounter for general adult medical examination without abnormal findings: Secondary | ICD-10-CM

## 2013-01-22 DIAGNOSIS — I509 Heart failure, unspecified: Secondary | ICD-10-CM | POA: Insufficient documentation

## 2013-01-22 DIAGNOSIS — I472 Ventricular tachycardia: Secondary | ICD-10-CM

## 2013-01-22 DIAGNOSIS — I499 Cardiac arrhythmia, unspecified: Secondary | ICD-10-CM

## 2013-01-22 DIAGNOSIS — K219 Gastro-esophageal reflux disease without esophagitis: Secondary | ICD-10-CM | POA: Insufficient documentation

## 2013-01-22 DIAGNOSIS — I4892 Unspecified atrial flutter: Secondary | ICD-10-CM

## 2013-01-22 DIAGNOSIS — D649 Anemia, unspecified: Secondary | ICD-10-CM

## 2013-01-22 HISTORY — PX: CARDIOVERSION: SHX1299

## 2013-01-22 SURGERY — CARDIOVERSION
Anesthesia: General

## 2013-01-22 MED ORDER — SODIUM CHLORIDE 0.9 % IV SOLN
INTRAVENOUS | Status: DC
  Administered 2013-01-22: 500 mL via INTRAVENOUS

## 2013-01-22 MED ORDER — PROPOFOL 10 MG/ML IV BOLUS
INTRAVENOUS | Status: DC | PRN
  Administered 2013-01-22: 70 mg via INTRAVENOUS

## 2013-01-22 MED ORDER — LIDOCAINE HCL (CARDIAC) 20 MG/ML IV SOLN
INTRAVENOUS | Status: DC | PRN
  Administered 2013-01-22: 100 mg via INTRAVENOUS

## 2013-01-22 NOTE — Interval H&P Note (Signed)
History and Physical Interval Note: Patient seen and examined, since prior clinic visit, no change in the history, physical exam, assessment and plan. 01/22/2013 1:32 PM  [name]  has presented today for surgery, with the diagnosis of A FIB  The various methods of treatment have been discussed with the patient and family. After consideration of risks, benefits and other options for treatment, the patient has consented to  Procedure(s): CARDIOVERSION (N/A) as a surgical intervention .  The patient's history has been reviewed, patient examined, no change in status, stable for surgery.  I have reviewed the patient's chart and labs.  Questions were answered to the patient's satisfaction.     Leonia Reeves.D.

## 2013-01-22 NOTE — Anesthesia Preprocedure Evaluation (Signed)
Anesthesia Evaluation  Patient identified by MRN, date of birth, ID band Patient awake    Reviewed: Allergy & Precautions, H&P , NPO status , Patient's Chart, lab work & pertinent test results  History of Anesthesia Complications (+) history of anesthetic complications  Airway Mallampati: II TM Distance: >3 FB Neck ROM: Full    Dental  (+) Teeth Intact and Dental Advisory Given   Pulmonary sleep apnea ,    Pulmonary exam normal       Cardiovascular hypertension, Pt. on medications and Pt. on home beta blockers + CAD and +CHF + dysrhythmias Rhythm:Irregular Rate:Normal     Neuro/Psych negative neurological ROS  negative psych ROS   GI/Hepatic Neg liver ROS, GERD-  Medicated,  Endo/Other  negative endocrine ROSdiabetes  Renal/GU negative Renal ROS     Musculoskeletal   Abdominal   Peds  Hematology   Anesthesia Other Findings   Reproductive/Obstetrics                           Anesthesia Physical Anesthesia Plan  ASA: III  Anesthesia Plan: General   Post-op Pain Management:    Induction: Intravenous  Airway Management Planned: Mask  Additional Equipment:   Intra-op Plan:   Post-operative Plan:   Informed Consent: I have reviewed the patients History and Physical, chart, labs and discussed the procedure including the risks, benefits and alternatives for the proposed anesthesia with the patient or authorized representative who has indicated his/her understanding and acceptance.   Dental advisory given  Plan Discussed with: CRNA, Anesthesiologist and Surgeon  Anesthesia Plan Comments:         Anesthesia Quick Evaluation

## 2013-01-22 NOTE — Anesthesia Postprocedure Evaluation (Signed)
  Anesthesia Post-op Note  Patient: [name]  Procedure(s) Performed: Procedure(s): CARDIOVERSION (N/A)  Patient Location: PACU and Endoscopy Unit  Anesthesia Type:General  Level of Consciousness: awake and alert   Airway and Oxygen Therapy: Patient Spontanous Breathing and Patient connected to nasal cannula oxygen  Post-op Pain: none  Post-op Assessment: Post-op Vital signs reviewed, Patient's Cardiovascular Status Stable, Respiratory Function Stable, Patent Airway and No signs of Nausea or vomiting  Post-op Vital Signs: Reviewed and stable  Complications: No apparent anesthesia complications

## 2013-01-22 NOTE — CV Procedure (Signed)
Electrophysiology procedure note  Procedure: DC cardioversion  Indication: Symptomatic atrial fibrillation/flutter  Findings: After informed consent was obtained, the patient was prepped in the usual manner. The electro-dispersive pad was placed in the anterior posterior position. The patient was sedated under the direction of the anesthesia service with propofol. Dr. Krista Blue provided this. After he was appropriately sedated, 200 J of synchronized biphasic energy was delivered by way of the electro-dispersive pad, restoring sinus rhythm. The patient tolerated the procedure well.  Complications: There were no immediate complications  Conclusion: This demonstrate successful DC cardioversion in a patient with symptomatic atrial fibrillation.  [name], M.D.

## 2013-01-22 NOTE — Transfer of Care (Signed)
Immediate Anesthesia Transfer of Care Note  Patient: [name] 2 Procedure(s) Performed: Procedure(s): CARDIOVERSION (N/A)  Patient Location: PACU  Anesthesia Type:General  Level of Consciousness: awake and alert   Airway & Oxygen Therapy: Patient Spontanous Breathing and Patient connected to nasal cannula oxygen  Post-op Assessment: Report given to PACU RN, Post -op Vital signs reviewed and stable and Patient moving all extremities  Post vital signs: Reviewed and stable  Complications: No apparent anesthesia complications
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
## 2013-01-23 ENCOUNTER — Encounter (HOSPITAL_COMMUNITY): Payer: Self-pay | Admitting: Internal Medicine

## 2013-02-02 ENCOUNTER — Emergency Department (HOSPITAL_COMMUNITY): Payer: Medicare Other

## 2013-02-02 ENCOUNTER — Encounter (HOSPITAL_COMMUNITY): Payer: Self-pay | Admitting: Emergency Medicine

## 2013-02-02 ENCOUNTER — Inpatient Hospital Stay (HOSPITAL_COMMUNITY)
Admission: EM | Admit: 2013-02-02 | Discharge: 2013-02-04 | DRG: 293 | Disposition: A | Discharge status: Home / Self Care | Payer: Medicare Other | Attending: Cardiovascular Disease | Admitting: Cardiovascular Disease

## 2013-02-02 DIAGNOSIS — I2510 Atherosclerotic heart disease of native coronary artery without angina pectoris: Secondary | ICD-10-CM | POA: Diagnosis present

## 2013-02-02 DIAGNOSIS — M79609 Pain in unspecified limb: Secondary | ICD-10-CM

## 2013-02-02 DIAGNOSIS — D649 Anemia, unspecified: Secondary | ICD-10-CM | POA: Diagnosis present

## 2013-02-02 DIAGNOSIS — I4891 Unspecified atrial fibrillation: Secondary | ICD-10-CM | POA: Diagnosis present

## 2013-02-02 DIAGNOSIS — L4050 Arthropathic psoriasis, unspecified: Secondary | ICD-10-CM | POA: Diagnosis present

## 2013-02-02 DIAGNOSIS — R21 Rash and other nonspecific skin eruption: Secondary | ICD-10-CM

## 2013-02-02 DIAGNOSIS — Z87891 Personal history of nicotine dependence: Secondary | ICD-10-CM

## 2013-02-02 DIAGNOSIS — I10 Essential (primary) hypertension: Secondary | ICD-10-CM | POA: Diagnosis present

## 2013-02-02 DIAGNOSIS — I5033 Acute on chronic diastolic (congestive) heart failure: Principal | ICD-10-CM | POA: Diagnosis present

## 2013-02-02 DIAGNOSIS — I4892 Unspecified atrial flutter: Secondary | ICD-10-CM

## 2013-02-02 DIAGNOSIS — R079 Chest pain, unspecified: Secondary | ICD-10-CM | POA: Diagnosis present

## 2013-02-02 DIAGNOSIS — Z0000 Encounter for general adult medical examination without abnormal findings: Secondary | ICD-10-CM

## 2013-02-02 DIAGNOSIS — K219 Gastro-esophageal reflux disease without esophagitis: Secondary | ICD-10-CM | POA: Diagnosis present

## 2013-02-02 DIAGNOSIS — I4819 Other persistent atrial fibrillation: Secondary | ICD-10-CM

## 2013-02-02 DIAGNOSIS — I499 Cardiac arrhythmia, unspecified: Secondary | ICD-10-CM

## 2013-02-02 DIAGNOSIS — I5032 Chronic diastolic (congestive) heart failure: Secondary | ICD-10-CM

## 2013-02-02 DIAGNOSIS — Z7901 Long term (current) use of anticoagulants: Secondary | ICD-10-CM

## 2013-02-02 DIAGNOSIS — I509 Heart failure, unspecified: Secondary | ICD-10-CM | POA: Diagnosis present

## 2013-02-02 DIAGNOSIS — E119 Type 2 diabetes mellitus without complications: Secondary | ICD-10-CM | POA: Diagnosis present

## 2013-02-02 DIAGNOSIS — Z807 Family history of other malignant neoplasms of lymphoid, hematopoietic and related tissues: Secondary | ICD-10-CM

## 2013-02-02 DIAGNOSIS — R319 Hematuria, unspecified: Secondary | ICD-10-CM

## 2013-02-02 DIAGNOSIS — G4733 Obstructive sleep apnea (adult) (pediatric): Secondary | ICD-10-CM

## 2013-02-02 DIAGNOSIS — I472 Ventricular tachycardia: Secondary | ICD-10-CM

## 2013-02-02 DIAGNOSIS — Z8249 Family history of ischemic heart disease and other diseases of the circulatory system: Secondary | ICD-10-CM

## 2013-02-02 LAB — COMPREHENSIVE METABOLIC PANEL
ALT: 21 U/L (ref 0–53)
AST: 18 U/L (ref 0–37)
Albumin: 3.2 g/dL — ABNORMAL LOW (ref 3.5–5.2)
Alkaline Phosphatase: 113 U/L (ref 39–117)
BUN: 10 mg/dL (ref 6–23)
CO2: 25 mEq/L (ref 19–32)
Calcium: 8.5 mg/dL (ref 8.4–10.5)
Chloride: 104 mEq/L (ref 96–112)
Creatinine, Ser: 0.75 mg/dL (ref 0.50–1.35)
GFR calc non Af Amer: >90 mL/min (ref >90)
Sodium: 140 mEq/L (ref 135–145)

## 2013-02-02 LAB — CBC WITH DIFFERENTIAL/PLATELET
Basophils Relative: 0 % (ref 0–1)
Eosinophils Absolute: 0.2 10*3/uL (ref 0.0–0.7)
Eosinophils Relative: 3 % (ref 0–5)
HCT: 33.8 % — ABNORMAL LOW (ref 39.0–52.0)
Hemoglobin: 11.5 g/dL — ABNORMAL LOW (ref 13.0–17.0)
Lymphs Abs: 1.1 10*3/uL (ref 0.7–4.0)
MCH: 30.4 pg (ref 26.0–34.0)
MCHC: 34 g/dL (ref 30.0–36.0)
MCV: 89.4 fL (ref 78.0–100.0)
Monocytes Absolute: 0.4 10*3/uL (ref 0.1–1.0)
Monocytes Relative: 6 % (ref 3–12)
RBC: 3.78 MIL/uL — ABNORMAL LOW (ref 4.22–5.81)

## 2013-02-02 LAB — TROPONIN I: Troponin I: <0.3 ng/mL (ref <0.30)

## 2013-02-02 LAB — POCT I-STAT TROPONIN I: Troponin i, poc: 0 ng/mL (ref 0.00–0.08)

## 2013-02-02 MED ORDER — CAPTOPRIL 6.25 MG HALF TABLET
6.2500 mg | ORAL_TABLET | Freq: Three times a day (TID) | ORAL | Status: DC
  Filled 2013-02-02 (×7): qty 1

## 2013-02-02 MED ORDER — ACETAMINOPHEN 325 MG PO TABS
650.0000 mg | ORAL_TABLET | ORAL | Status: DC | PRN
  Filled 2013-02-02: qty 2

## 2013-02-02 MED ORDER — PANTOPRAZOLE SODIUM 40 MG PO TBEC
40.0000 mg | DELAYED_RELEASE_TABLET | Freq: Every day | ORAL | Status: DC
  Administered 2013-02-03 – 2013-02-04 (×2): 40 mg via ORAL
  Filled 2013-02-02 (×2): qty 1

## 2013-02-02 MED ORDER — SODIUM CHLORIDE 0.9 % IJ SOLN
3.0000 mL | Freq: Two times a day (BID) | INTRAMUSCULAR | Status: DC
  Administered 2013-02-02 – 2013-02-04 (×4): 3 mL via INTRAVENOUS

## 2013-02-02 MED ORDER — SODIUM CHLORIDE 0.9 % IV SOLN: 250.0000 mL | INTRAVENOUS | Status: DC | PRN

## 2013-02-02 MED ORDER — METOPROLOL TARTRATE 25 MG PO TABS
25.0000 mg | ORAL_TABLET | Freq: Two times a day (BID) | ORAL | Status: DC
  Administered 2013-02-02: 25 mg via ORAL
  Filled 2013-02-02 (×3): qty 1

## 2013-02-02 MED ORDER — AMIODARONE HCL 200 MG PO TABS
200.0000 mg | ORAL_TABLET | Freq: Two times a day (BID) | ORAL | Status: DC
  Administered 2013-02-02 – 2013-02-04 (×4): 200 mg via ORAL
  Filled 2013-02-02 (×5): qty 1

## 2013-02-02 MED ORDER — LORATADINE 10 MG PO TABS
10.0000 mg | ORAL_TABLET | Freq: Every day | ORAL | Status: DC
  Administered 2013-02-02 – 2013-02-04 (×3): 10 mg via ORAL
  Filled 2013-02-02 (×3): qty 1

## 2013-02-02 MED ORDER — ONDANSETRON HCL 4 MG/2ML IJ SOLN: 4.0000 mg | Freq: Four times a day (QID) | INTRAMUSCULAR | Status: DC | PRN

## 2013-02-02 MED ORDER — PROSIGHT PO TABS
1.0000 | ORAL_TABLET | Freq: Two times a day (BID) | ORAL | Status: DC
  Administered 2013-02-02 – 2013-02-04 (×4): 1 via ORAL
  Filled 2013-02-02 (×5): qty 1

## 2013-02-02 MED ORDER — POTASSIUM CHLORIDE CRYS ER 20 MEQ PO TBCR
40.0000 meq | EXTENDED_RELEASE_TABLET | Freq: Every day | ORAL | Status: DC
  Administered 2013-02-02 – 2013-02-04 (×3): 40 meq via ORAL
  Filled 2013-02-02 (×3): qty 2

## 2013-02-02 MED ORDER — FLUOXETINE HCL 20 MG PO TABS
20.0000 mg | ORAL_TABLET | Freq: Every day | ORAL | Status: DC
  Administered 2013-02-03 – 2013-02-04 (×2): 20 mg via ORAL
  Filled 2013-02-02 (×2): qty 1

## 2013-02-02 MED ORDER — DABIGATRAN ETEXILATE MESYLATE 150 MG PO CAPS
150.0000 mg | ORAL_CAPSULE | Freq: Two times a day (BID) | ORAL | Status: DC
  Administered 2013-02-02 – 2013-02-04 (×4): 150 mg via ORAL
  Filled 2013-02-02 (×5): qty 1

## 2013-02-02 MED ORDER — SODIUM CHLORIDE 0.9 % IJ SOLN: 3.0000 mL | INTRAMUSCULAR | Status: DC | PRN

## 2013-02-02 MED ORDER — NITROGLYCERIN 0.4 MG SL SUBL: 0.4000 mg | SUBLINGUAL_TABLET | SUBLINGUAL | Status: DC | PRN

## 2013-02-02 MED ORDER — FLUTICASONE PROPIONATE 50 MCG/ACT NA SUSP
2.0000 | Freq: Every day | NASAL | Status: DC
  Filled 2013-02-02: qty 16

## 2013-02-02 MED ORDER — FUROSEMIDE 10 MG/ML IJ SOLN
40.0000 mg | Freq: Two times a day (BID) | INTRAMUSCULAR | Status: DC
  Administered 2013-02-02 – 2013-02-04 (×4): 40 mg via INTRAVENOUS
  Filled 2013-02-02 (×6): qty 4

## 2013-02-02 MED ORDER — PRESERVISION AREDS 2 PO CAPS: 1.0000 | ORAL_CAPSULE | Freq: Two times a day (BID) | ORAL | Status: DC

## 2013-02-02 NOTE — ED Notes (Signed)
Had cardioversion on the 23 and since has gained 10 #s is sob and having cp since yesterday

## 2013-02-02 NOTE — ED Notes (Signed)
Pts belongings placed in belongings bag item include white tennis shoes and and blue t-shirt

## 2013-02-02 NOTE — ED Provider Notes (Signed)
Medical screening examination/treatment/procedure(s) were conducted as a shared visit with non-physician practitioner(s) and myself.  I personally evaluated the patient during the encounter.   Date: 02/02/2013  Rate: 48  Rhythm: sinus bradycardia  QRS Axis: left  Intervals: normal  ST/T Wave abnormalities: nonspecific ST changes  Conduction Disutrbances:left anterior fascicular block  Narrative Interpretation: Sinus brady with NS TW and ST changes  Old EKG Reviewed: changes noted    Pt admitted for CHF exacerbation and h/o Afib and CAD.  Cardiology to admit.  VSS.  Pt admitted in stable condition.    The patient appears reasonably stabilized for admission considering the current resources, flow, and capabilities available in the ED at this time, and I doubt any other [facility] requiring further screening and/or treatment in the ED prior to admission.   Darlys Gales, MD 02/02/13 2052

## 2013-02-02 NOTE — ED Notes (Signed)
Pt in radiology 
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
## 2013-02-02 NOTE — Progress Notes (Addendum)
VASCULAR LAB PRELIMINARY  PRELIMINARY  PRELIMINARY  PRELIMINARY  Left lower extremity venous Doppler completed.    Preliminary report:  There is no DVT or SVT noted in the left lower extremity. Pulsatile flow noted suggestive of fluid overload.  Shanicqua Coldren, RVT 02/02/2013, 2:18 PM

## 2013-02-02 NOTE — ED Notes (Signed)
MD at bedside. 
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
## 2013-02-02 NOTE — Progress Notes (Signed)
PT is refusing to wear CPAP tonight. He said he did not like our mask. I told him that he could bring in his mask from home and we would use it with our machine. RT will continue to monitor.

## 2013-02-02 NOTE — Progress Notes (Signed)
rec Pt from ED. Pt o4x, no complaints of pain or SOB. Will continue to monitor

## 2013-02-03 DIAGNOSIS — I517 Cardiomegaly: Secondary | ICD-10-CM

## 2013-02-03 LAB — BASIC METABOLIC PANEL
Calcium: 8.9 mg/dL (ref 8.4–10.5)
Chloride: 99 mEq/L (ref 96–112)
Creatinine, Ser: 0.76 mg/dL (ref 0.50–1.35)
GFR calc Af Amer: >90 mL/min (ref >90)
Glucose, Bld: 137 mg/dL — ABNORMAL HIGH (ref 70–99)

## 2013-02-03 LAB — TROPONIN I
Troponin I: <0.3 ng/mL (ref <0.30)
Troponin I: <0.3 ng/mL (ref <0.30)

## 2013-02-03 LAB — TSH: TSH: 2.677 u[IU]/mL (ref 0.350–4.500)

## 2013-02-03 MED ORDER — TRAMADOL HCL 50 MG PO TABS: 50.0000 mg | ORAL_TABLET | ORAL | Status: DC | PRN

## 2013-02-03 MED ORDER — COLCHICINE 0.6 MG PO TABS
0.6000 mg | ORAL_TABLET | Freq: Two times a day (BID) | ORAL | Status: DC
  Administered 2013-02-03 – 2013-02-04 (×3): 0.6 mg via ORAL
  Filled 2013-02-03 (×4): qty 1

## 2013-02-03 MED ORDER — IBUPROFEN 600 MG PO TABS
600.0000 mg | ORAL_TABLET | Freq: Four times a day (QID) | ORAL | Status: AC
  Administered 2013-02-03 (×2): 600 mg via ORAL
  Filled 2013-02-03 (×2): qty 1

## 2013-02-03 MED ORDER — TRAMADOL HCL 50 MG PO TABS
50.0000 mg | ORAL_TABLET | Freq: Four times a day (QID) | ORAL | Status: DC | PRN
  Administered 2013-02-03: 50 mg via ORAL
  Filled 2013-02-03: qty 1

## 2013-02-03 MED ORDER — METOPROLOL TARTRATE 12.5 MG HALF TABLET
12.5000 mg | ORAL_TABLET | Freq: Two times a day (BID) | ORAL | Status: DC
  Administered 2013-02-03 – 2013-02-04 (×3): 12.5 mg via ORAL
  Filled 2013-02-03 (×4): qty 1

## 2013-02-03 NOTE — Progress Notes (Signed)
  Echocardiogram 2D Echocardiogram has been performed.  Cathie Beams 02/03/2013, 12:07 PM

## 2013-02-03 NOTE — Progress Notes (Signed)
02/02/2013 7p-7a shift. Pt.is A/Ox4 and is ambulatory without assistance. He is on room air and has had no signs of distress. Pt.said that he wears CPAP at home with nasal mask so MD on call was notified and orders written for CPAP. However pt.refused CPAP when respiratory came to his room. Pt.had c/o left heel and left sole pain and prn tylenol was offered. Pt.refused prn tyelnol said "It does nothing for me" ask pt.what is usually takes for pain and he said ibuprofen 800 mg at home. 2233 MD on call Dr.Balfour was called and notified. Since pt.was admitted with CHF was told that he couldn't receive ibuprofen and to encourage him to take the tylenol if it didn't relief his pain to call back. Pt.was notified and still refused tylenol and said " Im not going to even take it, I know it won't work" patient told me that he would just try to get some sleep. 0142 While on break  patient called to the nurses station with c/o left foot and leg pain  and the charge RN paged the MD on call Dr.Balfour. On call MD said that he would address.  MD on call was called again to notify. MD on call then came to patient's room to assess patient. Orders were written for tramadol 50 mg po q6hrs. Patient was given tramadol. [id number] On call MD was called and notified of patient's c/o left leg and foot pain. Asked if frequency of tramadol could be changed. New orders were written. Patient is now refusing tramadol. Said that he would like to speak with his cardiologist in the am . I told him that  I would leave a note on his chart for the physician to view in the am.

## 2013-02-03 NOTE — Progress Notes (Signed)
SUBJECTIVE: The patient feels like his breathing is improved.  He diuresed 2L yesterday.  C/o left ankle and foot pain this morning.  He states he has history of gout and psoriatic arthritis.  He normally takes Ibuprofen at home for pain.   He was having some hyoptension in the afternoons and so his primary care physician recently decreased his home dose of Lasix to 10mg  daily.   CURRENT MEDICATIONS: . amiodarone  200 mg Oral BID  . captopril  6.25 mg Oral TID  . colchicine  0.6 mg Oral BID  . dabigatran  150 mg Oral Q12H  . FLUoxetine  20 mg Oral Daily  . fluticasone  2 spray Each Nare Daily  . furosemide  40 mg Intravenous BID  . loratadine  10 mg Oral Daily  . metoprolol tartrate  25 mg Oral BID  . multivitamin  1 tablet Oral BID  . pantoprazole  40 mg Oral Daily  . potassium chloride  40 mEq Oral Daily  . sodium chloride  3 mL Intravenous Q12H      OBJECTIVE: Physical Exam: Filed Vitals:   02/02/13 1630 02/02/13 1700 02/02/13 2028 02/03/13 0529  BP: 152/65 155/66 150/63 154/51  Pulse: 47 43 54 47  Temp:  97.8 F (36.6 C) 97.4 F (36.3 C) 97.6 F (36.4 C)  TempSrc:  Oral Oral Oral  Resp: 11 18 20 20   Height:  5\' 10"  (1.778 m)    Weight:  291 lb 7.2 oz (132.2 kg)  285 lb 11.5 oz (129.6 kg)  SpO2: 100% 95% 95% 96%    Intake/Output Summary (Last 24 hours) at 02/03/13 0731 Last data filed at 02/03/13 9604  Gross per 24 hour  Intake      3 ml  Output   2175 ml  Net  -2172 ml    Telemetry reveals sinus bradycardia (40-50)  GEN- The patient is well appearing, alert and oriented x 3 today.   Head- normocephalic, atraumatic Eyes-  Sclera clear, conjunctiva pink Ears- hearing intact Oropharynx- clear Neck- supple, no JVP Lymph- no cervical lymphadenopathy Lungs- Clear to ausculation bilaterally, normal work of breathing Heart- brady regular rhythm, no murmurs, rubs or gallops, PMI not laterally displaced GI- soft, NT, ND, + BS Extremities- LLE with 2+ edema,  pulses intact, no redness Skin- no rash or lesion Psych- euthymic mood, full affect Neuro- strength and sensation are intact  LABS: Basic Metabolic Panel:  Recent Labs  54/09/81 1256 02/03/13 0510  NA 140 136  K 3.5 3.6  CL 104 99  CO2 25 26  GLUCOSE 217* 137*  BUN 10 11  CREATININE 0.75 0.76  CALCIUM 8.5 8.9   Liver Function Tests:  Recent Labs  02/02/13 1256  AST 18  ALT 21  ALKPHOS 113  BILITOT 1.3*  PROT 7.3  ALBUMIN 3.2*   CBC:  Recent Labs  02/02/13 1256  WBC 6.9  NEUTROABS 5.2  HGB 11.5*  HCT 33.8*  MCV 89.4  PLT 214   Cardiac Enzymes:  Recent Labs  02/02/13 1840 02/03/13 02/03/13 0510  TROPONINI <0.30 <0.30 <0.30  Thyroid Function Tests:  Recent Labs  02/02/13 1840  TSH 2.677    RADIOLOGY: Dg Chest 2 View 02/02/2013   CLINICAL DATA:  The bony thorax is intact.  EXAM: CHEST  2 VIEW  COMPARISON:  06/08/2011  FINDINGS: The heart is enlarged but stable. Moderate central vascular congestion and mild interstitial edema. No pleural effusions.  IMPRESSION: Cardiac enlargement and central vascular congestion  with mild interstitial edema.   Electronically Signed   By: Loralie Champagne M.D.   On: 02/02/2013 13:54    ASSESSMENT AND PLAN:  Active Problems:   Acute on chronic diastolic CHF (congestive heart failure), NYHA class 4   Chest pain   CAD (coronary artery disease)  1. Acute on chronic diastolic CHf Diuresis well Continue IV diuresis for another 24 hours then consider transition back to oral lasix  2. Persistent afib Maintaining sinus rhythm with amiodarone Continue pradaxa  3. Sinus bradycardia Stable Decrease metoprolol to 12.5mg  BID  4. LLE edema Previously treated as gout Will follow-up on ultrasound of the L leg which patient says was done in the ER Will give short coarse of ibuprofen per pts strong request though I have stressed the importance of avoiding NSAIDS if able  5. HTN Follow with diuresis Titrate ace inhibitor  as above

## 2013-02-03 NOTE — Progress Notes (Signed)
Utilization Review Completed.Draden Cottingham T11/07/2012  

## 2013-02-03 NOTE — Progress Notes (Signed)
Patient complained of pain 10/10 to his left ankle, radiating up his leg.  Increased swelling noted to left foot.   + left pedal pulse.  MD notified on call.  Awaiting new orders. RN will continue to monitor. Louretta Parma, RN

## 2013-02-04 LAB — BASIC METABOLIC PANEL
BUN: 16 mg/dL (ref 6–23)
CO2: 29 mEq/L (ref 19–32)
Calcium: 9.2 mg/dL (ref 8.4–10.5)
Chloride: 102 mEq/L (ref 96–112)
Creatinine, Ser: 0.96 mg/dL (ref 0.50–1.35)
GFR calc Af Amer: >90 mL/min (ref >90)
Glucose, Bld: 125 mg/dL — ABNORMAL HIGH (ref 70–99)

## 2013-02-04 MED ORDER — COLCHICINE 0.6 MG PO TABS: 0.6000 mg | ORAL_TABLET | Freq: Two times a day (BID) | ORAL | Status: DC

## 2013-02-04 MED ORDER — POTASSIUM CHLORIDE CRYS ER 20 MEQ PO TBCR: 20.0000 meq | EXTENDED_RELEASE_TABLET | Freq: Every day | ORAL | Status: DC

## 2013-02-04 MED ORDER — FUROSEMIDE 20 MG PO TABS: 40.0000 mg | ORAL_TABLET | Freq: Every day | ORAL | Status: DC

## 2013-02-04 MED ORDER — LISINOPRIL 5 MG PO TABS: 5.0000 mg | ORAL_TABLET | Freq: Every day | ORAL | Status: DC

## 2013-02-04 MED ORDER — METOPROLOL TARTRATE 25 MG PO TABS: 12.5000 mg | ORAL_TABLET | Freq: Two times a day (BID) | ORAL | Status: DC

## 2013-02-04 NOTE — Progress Notes (Signed)
Patient ID: [name], male   DOB: 10/19/1945, 67 y.o.   MRN: [id number] Subjective:  Continued improvement, dyspnea nearly back to baseline. Weight down 11 lbs.  Objective:  Vital Signs in the last 24 hours: Temp:  [97.1 F (36.2 C)-98.1 F (36.7 C)] 97.8 F (36.6 C) (11/05 0539) Pulse Rate:  [44-51] 48 (11/05 0539) Resp:  [18-20] 20 (11/05 0539) BP: (130-168)/(52-62) 140/57 mmHg (11/05 0539) SpO2:  [93 %-97 %] 96 % (11/05 0539) Weight:  [283 lb 11.7 oz (128.7 kg)] 283 lb 11.7 oz (128.7 kg) (11/05 0539)  Intake/Output from previous day: 11/04 0701 - 11/05 0700 In: 1023 [P.O.:1020; I.V.:3] Out: 3225 [Urine:3225] Intake/Output from this shift:    Physical Exam: Well appearing NAD HEENT: Unremarkable Neck:  No JVD, no thyromegally Back:  No CVA tenderness Lungs:  Clear with no wheezes HEART:  Regular rate rhythm, no murmurs, no rubs, no clicks Abd:  obese, positive bowel sounds, no organomegally, no rebound, no guarding Ext:  2 plus pulses, no edema, no cyanosis, no clubbing Skin:  No rashes no nodules Neuro:  CN II through XII intact, motor grossly intact  Lab Results:  Recent Labs  02/02/13 1256  WBC 6.9  HGB 11.5*  PLT 214    Recent Labs  02/03/13 0510 02/04/13 0340  NA 136 140  K 3.6 5.2*  CL 99 102  CO2 26 29  GLUCOSE 137* 125*  BUN 11 16  CREATININE 0.76 0.96    Recent Labs  02/03/13 02/03/13 0510  TROPONINI <0.30 <0.30   Hepatic Function Panel  Recent Labs  02/02/13 1256  PROT 7.3  ALBUMIN 3.2*  AST 18  ALT 21  ALKPHOS 113  BILITOT 1.3*   No results found for this basename: CHOL,  in the last 72 hours No results found for this basename: PROTIME,  in the last 72 hours  Imaging: Dg Chest 2 View  02/02/2013   CLINICAL DATA:  The bony thorax is intact.  EXAM: CHEST  2 VIEW  COMPARISON:  06/08/2011  FINDINGS: The heart is enlarged but stable. Moderate central vascular congestion and mild interstitial edema. No pleural effusions.   IMPRESSION: Cardiac enlargement and central vascular congestion with mild interstitial edema.   Electronically Signed   By: Loralie Champagne M.D.   On: 02/02/2013 13:54    Cardiac Studies: Tele - nsr/sinus brady Assessment/Plan:  1.Acute on chronic diastolic CHF 2. PAF, s/p recent DCCV, maintaining NSR 3. HTN 4. Morbid obesity Rec: ok for discharge. He will need lasix 40 mg daily in addition to his home meds. He has an appointment with me next week. I would like him to get labs on that visit.  LOS: 2 days    Gregg Taylor,M.D. 02/04/2013, 8:08 AM

## 2013-02-04 NOTE — Progress Notes (Signed)
Pt discharged to home accompanied by wife, assisted with a wheelchair. Discharge instructions given, pt verbalized understanding. Not in apparent distress.

## 2013-02-04 NOTE — Discharge Summary (Signed)
CARDIOLOGY DISCHARGE SUMMARY    Patient ID: [name],  MRN: [id number], DOB/AGE: 1945/10/25 67 y.o.  Admit date: 02/02/2013 Discharge date: 02/04/2013  Primary Care Physician: Crawford Givens, MD Primary Cardiologist: [name], MD  Primary Discharge Diagnosis:  1. Acute on chronic diastolic HF  Secondary Discharge Diagnoses:  1. PAF s/p recent DCCV, maintaining NSR 2. HTN 3. Morbid obesity 4. Paroxysmal VT 5. Preserved LV systolic function, EF 55-60% by echo 2013 6. DM 7. Nonobstructive CAD s/p cath 2006  Procedures This Admission:  None   History and Hospital Course:  [name] is a 67 yo man with paroxysmal atrial fibrillation, chronic diastolic HF, parosxymal VT, obesity, HTN, DM, GERD, non-obstructive CAD by cath 2006 who presented to ED on 02/02/2013 with complaints of worsening dyspnea, CP, LE swelling, orthopnea and 10 lb weight gain over last 3 days. He had recent cardioversion on 01/23/13 by Dr. Ladona Ridgel. He is maintaining NSR. BNP was elevated at 1091. Last echo March 2013 with normal LV systolic function and no significant valvular abnormalities. He was admitted with acute on chronic diastolic HF. He was diuresed with IV Lasix. His BP was poorly controlled and ACEi was added to his regimen. He was continued on amiodarone, BB and Pradaxa. Troponin negative. Yesterday he complained of gout pain in the left foot. Colchicine was added. His symptoms have improved and he remains hemodynamically stable. He has been seen, examined and deemed stable for discharge home today by Dr. [name]. He will follow-up with Dr. Ladona Ridgel next week with labs.      Discharge Vitals: Blood pressure 134/45, pulse 49, temperature 97.8 F (36.6 C), temperature source Oral, resp. rate 20, height 5\' 10"  (1.778 m), weight 283 lb 11.7 oz (128.7 kg), SpO2 96.00%.   Labs: Lab Results  Component Value Date   WBC 6.9 02/02/2013   HGB 11.5* 02/02/2013   HCT 33.8* 02/02/2013   MCV 89.4 02/02/2013   PLT 214 02/02/2013    Recent Labs Lab 02/02/13 1256  02/04/13 0340  NA 140  < > 140  K 3.5  < > 5.2*  CL 104  < > 102  CO2 25  < > 29  BUN 10  < > 16  CREATININE 0.75  < > 0.96  CALCIUM 8.5  < > 9.2  PROT 7.3  --   --   BILITOT 1.3*  --   --   ALKPHOS 113  --   --   ALT 21  --   --   AST 18  --   --   GLUCOSE 217*  < > 125*  < > = values in this interval not displayed. Lab Results  Component Value Date   CKTOTAL 261* 06/09/2011   CKMB 3.5 06/09/2011   TROPONINI <0.30 02/03/2013     Disposition:  The patient is being discharged in stable condition.  Follow-up:     Follow-up Information   Follow up with [name], MD On 02/10/2013. (At 4:00 PM)    Specialty:  Cardiology   Contact information:   [address] [place] [address] [phone]      Discharge Medications:    Medication List    STOP taking these medications       ibuprofen 200 MG tablet  Commonly known as:  ADVIL,MOTRIN      TAKE these medications       amiodarone 200 MG tablet  Commonly known as:  PACERONE  Take 1 tablet (200  mg total) by mouth 2 (two) times daily.     cetirizine 10 MG tablet  Commonly known as:  ZYRTEC  Take 10 mg by mouth daily.     colchicine 0.6 MG tablet  Take 1 tablet (0.6 mg total) by mouth 2 (two) times daily.     dabigatran 150 MG Caps capsule  Commonly known as:  PRADAXA  Take 1 capsule (150 mg total) by mouth every 12 (twelve) hours.     FLUoxetine 20 MG tablet  Commonly known as:  PROZAC  Take 20 mg by mouth daily.     furosemide 20 MG tablet  Commonly known as:  LASIX  Take 2 tablets (40 mg total) by mouth daily.     lisinopril 5 MG tablet  Commonly known as:  PRINIVIL,ZESTRIL  Take 1 tablet (5 mg total) by mouth daily.     metoprolol tartrate 25 MG tablet  Commonly known as:  LOPRESSOR  Take 0.5 tablets (12.5 mg total) by mouth 2 (two) times daily.     mometasone 50 MCG/ACT nasal spray  Commonly known as:  NASONEX  Place 2  sprays into the nose daily.     omeprazole 20 MG capsule  Commonly known as:  PRILOSEC  Take 20 mg by mouth daily.     potassium chloride SA 20 MEQ tablet  Commonly known as:  K-DUR,KLOR-CON  Take 1 tablet (20 mEq total) by mouth daily.     PRESERVISION AREDS 2 Caps  Take 1 capsule by mouth 2 (two) times daily.       Duration of Discharge Encounter: Greater than 30 minutes including physician time.  Signed, Rick Duff, PA-C 02/04/2013, 11:15 AM

## 2013-02-10 ENCOUNTER — Other Ambulatory Visit: Payer: Medicare Other

## 2013-02-10 ENCOUNTER — Encounter: Payer: Self-pay | Admitting: Internal Medicine

## 2013-02-10 ENCOUNTER — Ambulatory Visit (INDEPENDENT_AMBULATORY_CARE_PROVIDER_SITE_OTHER): Payer: Medicare Other | Admitting: Internal Medicine

## 2013-02-10 VITALS — BP 143/76 | HR 50 | Ht 70.0 in | Wt 279.2 lb

## 2013-02-10 DIAGNOSIS — I5033 Acute on chronic diastolic (congestive) heart failure: Secondary | ICD-10-CM

## 2013-02-10 DIAGNOSIS — I4819 Other persistent atrial fibrillation: Secondary | ICD-10-CM

## 2013-02-10 DIAGNOSIS — I509 Heart failure, unspecified: Secondary | ICD-10-CM

## 2013-02-10 DIAGNOSIS — I4892 Unspecified atrial flutter: Secondary | ICD-10-CM

## 2013-02-10 DIAGNOSIS — I472 Ventricular tachycardia: Secondary | ICD-10-CM

## 2013-02-10 DIAGNOSIS — I5032 Chronic diastolic (congestive) heart failure: Secondary | ICD-10-CM

## 2013-02-10 DIAGNOSIS — I10 Essential (primary) hypertension: Secondary | ICD-10-CM

## 2013-02-10 DIAGNOSIS — I4891 Unspecified atrial fibrillation: Secondary | ICD-10-CM

## 2013-02-10 NOTE — Assessment & Plan Note (Signed)
His heart failure symptoms are class II. He has not wanted to take his lisinopril. In addition, he has not wanted to take his potassium supplementation. I encouraged him today on the importance of both medications.

## 2013-02-10 NOTE — Assessment & Plan Note (Signed)
His blood pressure is slightly elevated today. Hopefully this will improve with reinitiation of his lisinopril.

## 2013-02-10 NOTE — Assessment & Plan Note (Signed)
He is maintaining sinus rhythm very nicely. He'll continue his current dose of amiodarone , with the expectation that he reduce the dose when I see him back in several months.
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
## 2013-02-10 NOTE — Patient Instructions (Signed)
Your physician recommends that you continue on your current medications as directed. Please refer to the Current Medication list given to you today.  Your physician recommends that you schedule a follow-up appointment in: 3 months with Dr.Taylor  

## 2013-02-12 ENCOUNTER — Ambulatory Visit: Payer: Medicare Other | Admitting: Internal Medicine

## 2013-04-06 ENCOUNTER — Other Ambulatory Visit: Payer: Self-pay

## 2013-04-06 MED ORDER — FUROSEMIDE 40 MG PO TABS: 40.0000 mg | ORAL_TABLET | Freq: Every day | ORAL | Status: DC

## 2013-04-29 ENCOUNTER — Ambulatory Visit (INDEPENDENT_AMBULATORY_CARE_PROVIDER_SITE_OTHER): Payer: Medicare Other | Admitting: Pulmonary Disease

## 2013-04-29 ENCOUNTER — Encounter: Payer: Self-pay | Admitting: Pulmonary Disease

## 2013-04-29 VITALS — BP 144/90 | HR 75 | Temp 97.8°F | Ht 70.0 in | Wt 287.0 lb

## 2013-04-29 DIAGNOSIS — G4733 Obstructive sleep apnea (adult) (pediatric): Secondary | ICD-10-CM

## 2013-04-29 NOTE — Patient Instructions (Signed)
Will schedule for bipap titration study to try and get your sleep apnea adequately treated.  Will call you once I receive results. Work on weight loss.  Will then establish you with a new home care company once we order new device.

## 2013-04-29 NOTE — Assessment & Plan Note (Signed)
The patient continues to have poor CPAP tolerance, and we are unable to get a download from his home care company to try and trouble shoot this. Based on what he is telling me, he either is CPAP intolerant and will need bilevel, or his sleep as being fragmented by the aggravation from having to wear a positive pressure device with a mask. The patient is unclear which of these may be the issue. I really think given the severity of his sleep apnea, that we should try him on bilevel to see if this tolerance is improved. He has never had his pressure formally optimized at the sleep Center, and we'll therefore schedule for a bilevel titration study. Will also establish him with a different medical equipment company. Finally, I have encouraged him to work aggressively on weight loss.

## 2013-04-29 NOTE — Progress Notes (Signed)
   Subjective:    Patient ID: [name], male    DOB: [birth year], 68 y.o.   MRN: [id number]  HPI The patient comes in today for followup of his obstructive sleep apnea. He is been trying to wear CPAP, but has had a very poor experience and what sounds like a poor response. He has a mask that fits well and doesn't leak, and he does not believe that he has mouth opening with airleak. Despite wearing the CPAP on an optimized fixed pressure, he continues to have frequent awakenings during the night, some of which leads to prolonged awakening.  He is unsure why this happens, but states it was not an issue prior to starting CPAP.   Review of Systems  Constitutional: Negative for fever and unexpected weight change.  HENT: Negative for congestion, dental problem, ear pain, nosebleeds, postnasal drip, rhinorrhea, sinus pressure, sneezing, sore throat and trouble swallowing.   Eyes: Negative for redness and itching.  Respiratory: Negative for cough, chest tightness, shortness of breath and wheezing.   Cardiovascular: Negative for palpitations and leg swelling.  Gastrointestinal: Negative for nausea and vomiting.  Genitourinary: Negative for dysuria.  Musculoskeletal: Negative for joint swelling.  Skin: Negative for rash.  Neurological: Negative for headaches.  Hematological: Does not bruise/bleed easily.  Psychiatric/Behavioral: Negative for dysphoric mood. The patient is not nervous/anxious.        Objective:   Physical Exam Morbidly obese male in no acute distress Nose without purulence or discharge noted No skin breakdown or pressure necrosis from the CPAP mask Neck without lymphadenopathy or thyromegaly Chest clear to auscultation Lower extremities with mild edema, no cyanosis Alert and oriented, moves all 4 extremities.       Assessment & Plan:

## 2013-04-30 ENCOUNTER — Telehealth: Payer: Self-pay | Admitting: *Deleted
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
## 2013-05-01 NOTE — Telephone Encounter (Signed)
Follow Up ° °Pt returning call from earlier. Please call. °

## 2013-05-01 NOTE — Telephone Encounter (Signed)
Spoke with patient and he says his experience was terrible.  He had 4 different nurses while there for DCCV,  the 2nd nurse did the IV and it infiltrated.   His wife went and told nurse and "they didn't seem to care."  Both he and his wife were upset about his experience as his prior had been good.   They were fine with the MD doing the procedure.  He was very appreciative of my call and says he still has not heard back from the hospital.  He has a scheduled follow up 05/15/13 that he is aware of and will keep.  He will call me back if needed

## 2013-05-12 ENCOUNTER — Telehealth: Payer: Self-pay

## 2013-05-12 ENCOUNTER — Ambulatory Visit (INDEPENDENT_AMBULATORY_CARE_PROVIDER_SITE_OTHER): Payer: Medicare Other | Admitting: Internal Medicine

## 2013-05-12 ENCOUNTER — Encounter: Payer: Self-pay | Admitting: Internal Medicine

## 2013-05-12 VITALS — BP 138/78 | HR 52 | Temp 98.1°F | Wt 285.5 lb

## 2013-05-12 DIAGNOSIS — J019 Acute sinusitis, unspecified: Secondary | ICD-10-CM

## 2013-05-12 MED ORDER — AMOXICILLIN-POT CLAVULANATE 875-125 MG PO TABS: 1.0000 | ORAL_TABLET | Freq: Two times a day (BID) | ORAL | Status: DC

## 2013-05-12 MED ORDER — FLUTICASONE PROPIONATE 50 MCG/ACT NA SUSP: 2.0000 | Freq: Every day | NASAL | Status: DC

## 2013-05-12 NOTE — Progress Notes (Signed)
Pre-visit discussion using our clinic review tool. No additional management support is needed unless otherwise documented below in the visit note.  
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
## 2013-05-12 NOTE — Telephone Encounter (Signed)
Pt called and when picked up med pt thought was supposed to get augmentin and got amoxicillin. I asked pt if it was amoxicillin clavulanate 875-125 mg and pt said Yes and I advised that is generic for augmentin. Pt voiced understanding.

## 2013-05-12 NOTE — Patient Instructions (Signed)

## 2013-05-13 NOTE — Telephone Encounter (Signed)
Noted, thank you

## 2013-05-15 ENCOUNTER — Ambulatory Visit (INDEPENDENT_AMBULATORY_CARE_PROVIDER_SITE_OTHER): Payer: Medicare Other | Admitting: Internal Medicine

## 2013-05-15 ENCOUNTER — Encounter: Payer: Self-pay | Admitting: Internal Medicine

## 2013-05-15 VITALS — BP 135/76 | HR 47 | Ht 70.0 in | Wt 286.0 lb

## 2013-05-15 DIAGNOSIS — I5032 Chronic diastolic (congestive) heart failure: Secondary | ICD-10-CM

## 2013-05-15 DIAGNOSIS — I472 Ventricular tachycardia: Secondary | ICD-10-CM

## 2013-05-15 DIAGNOSIS — I4819 Other persistent atrial fibrillation: Secondary | ICD-10-CM

## 2013-05-15 DIAGNOSIS — I4891 Unspecified atrial fibrillation: Secondary | ICD-10-CM

## 2013-05-15 DIAGNOSIS — I4729 Other ventricular tachycardia: Secondary | ICD-10-CM
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
## 2013-05-15 NOTE — Assessment & Plan Note (Signed)
He is maintaining NSR. He will continue his current meds except he is asked to stop his toprol secondary to bradycardia.

## 2013-05-15 NOTE — Assessment & Plan Note (Signed)
His symptoms are class 2. He will continue his current meds. I have asked him to lose weight and reduce his sodium intake.

## 2013-05-15 NOTE — Patient Instructions (Addendum)
Your physician recommends that you schedule a follow-up appointment in: 3 months with Dr Lovena Le   Your physician has recommended you make the following change in your medication:  1) Stop Metoprolol

## 2013-05-15 NOTE — Assessment & Plan Note (Signed)
He has had no recurrent ventricular arrhythmias. Will continue amio.

## 2013-05-21 ENCOUNTER — Telehealth: Payer: Self-pay | Admitting: *Deleted

## 2013-05-21 NOTE — Telephone Encounter (Signed)
[facility] approved pradaxa through 05/18/2014, [place] # [id number] Patient ID [id number]

## 2013-05-27 ENCOUNTER — Encounter (HOSPITAL_BASED_OUTPATIENT_CLINIC_OR_DEPARTMENT_OTHER): Payer: Medicare Other

## 2013-05-27 ENCOUNTER — Telehealth: Payer: Self-pay | Admitting: Pulmonary Disease

## 2013-05-27 DIAGNOSIS — G4733 Obstructive sleep apnea (adult) (pediatric): Secondary | ICD-10-CM
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
## 2013-05-27 NOTE — Telephone Encounter (Signed)
[email or web address] x1 for pt 

## 2013-05-27 NOTE — Telephone Encounter (Signed)
Would refer him to sleep center for mask fitting session during the day.  Code is [id number]

## 2013-05-27 NOTE — Telephone Encounter (Signed)
Returning call can be reached at [phone].[name]

## 2013-05-27 NOTE — Telephone Encounter (Signed)
Called spoke with pt. She is requetsing to speak with [facility] personally. He reports things is not going well and he doesn;t understand why he pays $40 to come see [place] and nothing ever happens properly. I have placed order for pt to go to the sleep center. PCC's will contact pt to get this set up. Please advise [place] thanks

## 2013-05-29 NOTE — Telephone Encounter (Signed)
Let pt know the mask fit is everything.  If this is not right, cpap will not work for him.  That is why I would like to focus on getting the best fit, then see if there is a pressure issue when he does his sleep test.  If all of this is optimized and he still cannot wear cpap consistently, he may be one of the 15% of pts who just cant wear cpap.

## 2013-05-29 NOTE — Telephone Encounter (Signed)
LMTCB

## 2013-06-01 NOTE — Telephone Encounter (Signed)
Called, spoke with pt. Explained below per [facility] to him.  He verbalized understanding of this and is ok with this plan.  The mask fitting is scheduled for tomorrow, and pt is aware of this.  Pt states they have moved the sleep test up to March 5- he will keep this appt as well.  He voiced no further questions or concerns at this time and will call back if anything further is needed.

## 2013-06-01 NOTE — Telephone Encounter (Signed)
[email or web address]. Zina Pitzer, CMA  

## 2013-06-02 ENCOUNTER — Ambulatory Visit (HOSPITAL_BASED_OUTPATIENT_CLINIC_OR_DEPARTMENT_OTHER): Payer: Medicare Other | Attending: Pulmonary Disease | Admitting: Radiology

## 2013-06-02 DIAGNOSIS — G4733 Obstructive sleep apnea (adult) (pediatric): Secondary | ICD-10-CM

## 2013-06-02 DIAGNOSIS — Z9989 Dependence on other enabling machines and devices: Principal | ICD-10-CM

## 2013-06-03 ENCOUNTER — Other Ambulatory Visit: Payer: Self-pay | Admitting: Urology

## 2013-06-03 NOTE — Progress Notes (Signed)
Surgery on 06/15/13.  Need orders in EP[facility].  Thank You.

## 2013-06-04 ENCOUNTER — Ambulatory Visit (HOSPITAL_BASED_OUTPATIENT_CLINIC_OR_DEPARTMENT_OTHER): Payer: Medicare Other | Admitting: Radiology

## 2013-06-04 VITALS — Ht 70.0 in | Wt 285.0 lb

## 2013-06-04 DIAGNOSIS — I4892 Unspecified atrial flutter: Secondary | ICD-10-CM | POA: Diagnosis present

## 2013-06-04 DIAGNOSIS — Z9989 Dependence on other enabling machines and devices: Principal | ICD-10-CM

## 2013-06-04 DIAGNOSIS — Z91011 Allergy to milk products: Secondary | ICD-10-CM

## 2013-06-04 DIAGNOSIS — G4733 Obstructive sleep apnea (adult) (pediatric): Secondary | ICD-10-CM

## 2013-06-04 DIAGNOSIS — R079 Chest pain, unspecified: Secondary | ICD-10-CM | POA: Diagnosis not present

## 2013-06-04 DIAGNOSIS — Z7901 Long term (current) use of anticoagulants: Secondary | ICD-10-CM

## 2013-06-04 DIAGNOSIS — Z8249 Family history of ischemic heart disease and other diseases of the circulatory system: Secondary | ICD-10-CM

## 2013-06-04 DIAGNOSIS — I10 Essential (primary) hypertension: Secondary | ICD-10-CM | POA: Diagnosis present

## 2013-06-04 DIAGNOSIS — K219 Gastro-esophageal reflux disease without esophagitis: Secondary | ICD-10-CM | POA: Diagnosis present

## 2013-06-04 DIAGNOSIS — I4891 Unspecified atrial fibrillation: Secondary | ICD-10-CM | POA: Diagnosis present

## 2013-06-04 DIAGNOSIS — Z7982 Long term (current) use of aspirin: Secondary | ICD-10-CM

## 2013-06-04 DIAGNOSIS — Z6841 Body Mass Index (BMI) 40.0 and over, adult: Secondary | ICD-10-CM

## 2013-06-04 DIAGNOSIS — I509 Heart failure, unspecified: Secondary | ICD-10-CM | POA: Diagnosis present

## 2013-06-04 DIAGNOSIS — E119 Type 2 diabetes mellitus without complications: Secondary | ICD-10-CM | POA: Diagnosis present

## 2013-06-04 DIAGNOSIS — Z87891 Personal history of nicotine dependence: Secondary | ICD-10-CM

## 2013-06-04 DIAGNOSIS — Z881 Allergy status to other antibiotic agents status: Secondary | ICD-10-CM

## 2013-06-04 DIAGNOSIS — R5381 Other malaise: Secondary | ICD-10-CM | POA: Diagnosis present

## 2013-06-04 DIAGNOSIS — I2510 Atherosclerotic heart disease of native coronary artery without angina pectoris: Principal | ICD-10-CM | POA: Diagnosis present

## 2013-06-04 DIAGNOSIS — R5383 Other fatigue: Secondary | ICD-10-CM

## 2013-06-04 DIAGNOSIS — Z7902 Long term (current) use of antithrombotics/antiplatelets: Secondary | ICD-10-CM

## 2013-06-04 DIAGNOSIS — I498 Other specified cardiac arrhythmias: Secondary | ICD-10-CM | POA: Diagnosis present

## 2013-06-04 DIAGNOSIS — I200 Unstable angina: Secondary | ICD-10-CM | POA: Diagnosis present

## 2013-06-04 DIAGNOSIS — I5032 Chronic diastolic (congestive) heart failure: Secondary | ICD-10-CM | POA: Diagnosis present

## 2013-06-05 ENCOUNTER — Encounter (HOSPITAL_COMMUNITY): Payer: Self-pay | Admitting: Emergency Medicine

## 2013-06-05 ENCOUNTER — Inpatient Hospital Stay (HOSPITAL_COMMUNITY)
Admission: EM | Admit: 2013-06-05 | Discharge: 2013-06-09 | DRG: 287 | Disposition: A | Discharge status: Home / Self Care | Payer: Medicare Other | Attending: Cardiovascular Disease | Admitting: Cardiovascular Disease

## 2013-06-05 ENCOUNTER — Emergency Department (HOSPITAL_COMMUNITY): Payer: Medicare Other

## 2013-06-05 ENCOUNTER — Encounter (HOSPITAL_COMMUNITY): Payer: Self-pay | Admitting: Pharmacy Technician

## 2013-06-05 DIAGNOSIS — I5032 Chronic diastolic (congestive) heart failure: Secondary | ICD-10-CM | POA: Diagnosis present

## 2013-06-05 DIAGNOSIS — I200 Unstable angina: Secondary | ICD-10-CM

## 2013-06-05 DIAGNOSIS — E1165 Type 2 diabetes mellitus with hyperglycemia: Secondary | ICD-10-CM | POA: Diagnosis present

## 2013-06-05 DIAGNOSIS — I2510 Atherosclerotic heart disease of native coronary artery without angina pectoris: Secondary | ICD-10-CM

## 2013-06-05 DIAGNOSIS — I10 Essential (primary) hypertension: Secondary | ICD-10-CM | POA: Diagnosis present

## 2013-06-05 DIAGNOSIS — R079 Chest pain, unspecified: Secondary | ICD-10-CM | POA: Diagnosis present

## 2013-06-05 DIAGNOSIS — I5033 Acute on chronic diastolic (congestive) heart failure: Secondary | ICD-10-CM

## 2013-06-05 DIAGNOSIS — I472 Ventricular tachycardia: Secondary | ICD-10-CM

## 2013-06-05 DIAGNOSIS — IMO0002 Reserved for concepts with insufficient information to code with codable children: Secondary | ICD-10-CM | POA: Diagnosis present

## 2013-06-05 DIAGNOSIS — Z7901 Long term (current) use of anticoagulants: Secondary | ICD-10-CM

## 2013-06-05 DIAGNOSIS — R7303 Prediabetes: Secondary | ICD-10-CM

## 2013-06-05 DIAGNOSIS — G4733 Obstructive sleep apnea (adult) (pediatric): Secondary | ICD-10-CM | POA: Diagnosis present

## 2013-06-05 DIAGNOSIS — I4729 Other ventricular tachycardia: Secondary | ICD-10-CM

## 2013-06-05 DIAGNOSIS — I4819 Other persistent atrial fibrillation: Secondary | ICD-10-CM

## 2013-06-05 LAB — CBC WITH DIFFERENTIAL/PLATELET
Basophils Absolute: 0 10*3/uL (ref 0.0–0.1)
Basophils Relative: 0 % (ref 0–1)
Eosinophils Absolute: 0.2 10*3/uL (ref 0.0–0.7)
Eosinophils Relative: 3 % (ref 0–5)
HEMATOCRIT: 37.8 % — AB (ref 39.0–52.0)
HEMOGLOBIN: 13.1 g/dL (ref 13.0–17.0)
LYMPHS PCT: 22 % (ref 12–46)
Lymphs Abs: 1.7 10*3/uL (ref 0.7–4.0)
MCH: 31 pg (ref 26.0–34.0)
MCHC: 34.7 g/dL (ref 30.0–36.0)
MCV: 89.6 fL (ref 78.0–100.0)
MONOS PCT: 7 % (ref 3–12)
Monocytes Absolute: 0.6 10*3/uL (ref 0.1–1.0)
NEUTROS ABS: 5.1 10*3/uL (ref 1.7–7.7)
NEUTROS PCT: 67 % (ref 43–77)
Platelets: 216 10*3/uL (ref 150–400)
RBC: 4.22 MIL/uL (ref 4.22–5.81)
RDW: 14.1 % (ref 11.5–15.5)
WBC: 7.6 10*3/uL (ref 4.0–10.5)

## 2013-06-05 LAB — COMPREHENSIVE METABOLIC PANEL
ALBUMIN: 3.3 g/dL — AB (ref 3.5–5.2)
ALT: 41 U/L (ref 0–53)
AST: 34 U/L (ref 0–37)
Alkaline Phosphatase: 133 U/L — ABNORMAL HIGH (ref 39–117)
BILIRUBIN TOTAL: 0.5 mg/dL (ref 0.3–1.2)
BUN: 17 mg/dL (ref 6–23)
CHLORIDE: 104 meq/L (ref 96–112)
CO2: 25 meq/L (ref 19–32)
Calcium: 9 mg/dL (ref 8.4–10.5)
Creatinine, Ser: 0.93 mg/dL (ref 0.50–1.35)
GFR calc Af Amer: >90 mL/min (ref >90)
GFR, EST NON AFRICAN AMERICAN: 86 mL/min — AB (ref >90)
Glucose, Bld: 209 mg/dL — ABNORMAL HIGH (ref 70–99)
POTASSIUM: 4.3 meq/L (ref 3.7–5.3)
Sodium: 143 mEq/L (ref 137–147)
Total Protein: 7.3 g/dL (ref 6.0–8.3)

## 2013-06-05 NOTE — ED Notes (Signed)
The pt has had mid-chest pain tonight while sitting at home watching tv with radiation up into his lt neck

## 2013-06-05 NOTE — ED Notes (Signed)
asprin 325mg  po just prior to arrival here tonight

## 2013-06-06 DIAGNOSIS — I509 Heart failure, unspecified: Secondary | ICD-10-CM | POA: Diagnosis present

## 2013-06-06 DIAGNOSIS — E1165 Type 2 diabetes mellitus with hyperglycemia: Secondary | ICD-10-CM | POA: Diagnosis present

## 2013-06-06 DIAGNOSIS — K219 Gastro-esophageal reflux disease without esophagitis: Secondary | ICD-10-CM | POA: Diagnosis present

## 2013-06-06 DIAGNOSIS — I200 Unstable angina: Secondary | ICD-10-CM | POA: Diagnosis present

## 2013-06-06 DIAGNOSIS — R5383 Other fatigue: Secondary | ICD-10-CM | POA: Diagnosis present

## 2013-06-06 DIAGNOSIS — I4891 Unspecified atrial fibrillation: Secondary | ICD-10-CM

## 2013-06-06 DIAGNOSIS — R5381 Other malaise: Secondary | ICD-10-CM | POA: Diagnosis present

## 2013-06-06 DIAGNOSIS — I4892 Unspecified atrial flutter: Secondary | ICD-10-CM | POA: Diagnosis present

## 2013-06-06 DIAGNOSIS — Z881 Allergy status to other antibiotic agents status: Secondary | ICD-10-CM | POA: Diagnosis not present

## 2013-06-06 DIAGNOSIS — I10 Essential (primary) hypertension: Secondary | ICD-10-CM

## 2013-06-06 DIAGNOSIS — Z91011 Allergy to milk products: Secondary | ICD-10-CM | POA: Diagnosis not present

## 2013-06-06 DIAGNOSIS — Z6841 Body Mass Index (BMI) 40.0 and over, adult: Secondary | ICD-10-CM | POA: Diagnosis not present

## 2013-06-06 DIAGNOSIS — I5032 Chronic diastolic (congestive) heart failure: Secondary | ICD-10-CM | POA: Diagnosis present

## 2013-06-06 DIAGNOSIS — I2510 Atherosclerotic heart disease of native coronary artery without angina pectoris: Secondary | ICD-10-CM | POA: Diagnosis present

## 2013-06-06 DIAGNOSIS — R079 Chest pain, unspecified: Secondary | ICD-10-CM | POA: Diagnosis present

## 2013-06-06 DIAGNOSIS — Z7982 Long term (current) use of aspirin: Secondary | ICD-10-CM | POA: Diagnosis not present

## 2013-06-06 DIAGNOSIS — G4733 Obstructive sleep apnea (adult) (pediatric): Secondary | ICD-10-CM | POA: Diagnosis present

## 2013-06-06 DIAGNOSIS — Z8249 Family history of ischemic heart disease and other diseases of the circulatory system: Secondary | ICD-10-CM | POA: Diagnosis not present

## 2013-06-06 DIAGNOSIS — Z87891 Personal history of nicotine dependence: Secondary | ICD-10-CM | POA: Diagnosis not present

## 2013-06-06 DIAGNOSIS — I498 Other specified cardiac arrhythmias: Secondary | ICD-10-CM | POA: Diagnosis present

## 2013-06-06 DIAGNOSIS — Z7901 Long term (current) use of anticoagulants: Secondary | ICD-10-CM | POA: Diagnosis not present

## 2013-06-06 DIAGNOSIS — IMO0002 Reserved for concepts with insufficient information to code with codable children: Secondary | ICD-10-CM | POA: Diagnosis present

## 2013-06-06 DIAGNOSIS — Z7902 Long term (current) use of antithrombotics/antiplatelets: Secondary | ICD-10-CM | POA: Diagnosis not present

## 2013-06-06 DIAGNOSIS — R7309 Other abnormal glucose: Secondary | ICD-10-CM

## 2013-06-06 DIAGNOSIS — E119 Type 2 diabetes mellitus without complications: Secondary | ICD-10-CM | POA: Diagnosis present

## 2013-06-06 LAB — PROTIME-INR
INR: 1.26 (ref 0.00–1.49)
PROTHROMBIN TIME: 15.5 s — AB (ref 11.6–15.2)

## 2013-06-06 LAB — MRSA PCR SCREENING: MRSA by PCR: POSITIVE — AB

## 2013-06-06 LAB — TROPONIN I
Troponin I: <0.3 ng/mL (ref <0.30)
Troponin I: <0.3 ng/mL (ref <0.30)

## 2013-06-06 LAB — HEPARIN LEVEL (UNFRACTIONATED)
Heparin Unfractionated: <0.1 IU/mL — ABNORMAL LOW (ref 0.30–0.70)
Heparin Unfractionated: <0.1 IU/mL — ABNORMAL LOW (ref 0.30–0.70)

## 2013-06-06 LAB — APTT: aPTT: 39 seconds — ABNORMAL HIGH (ref 24–37)

## 2013-06-06 MED ORDER — ACETAMINOPHEN 325 MG PO TABS
650.0000 mg | ORAL_TABLET | ORAL | Status: DC | PRN
  Filled 2013-06-06: qty 2

## 2013-06-06 MED ORDER — ONDANSETRON HCL 4 MG/2ML IJ SOLN
4.0000 mg | Freq: Four times a day (QID) | INTRAMUSCULAR | Status: DC | PRN
  Administered 2013-06-07: 4 mg via INTRAVENOUS
  Filled 2013-06-06: qty 2

## 2013-06-06 MED ORDER — HEPARIN BOLUS VIA INFUSION
4000.0000 [IU] | Freq: Once | INTRAVENOUS | Status: AC
  Administered 2013-06-06: 4000 [IU] via INTRAVENOUS
  Filled 2013-06-06: qty 4000

## 2013-06-06 MED ORDER — SODIUM CHLORIDE 0.9 % IV SOLN
INTRAVENOUS | Status: DC
  Administered 2013-06-07: 10 mL/h via INTRAVENOUS

## 2013-06-06 MED ORDER — IBUPROFEN 200 MG PO TABS
400.0000 mg | ORAL_TABLET | Freq: Four times a day (QID) | ORAL | Status: DC | PRN
  Administered 2013-06-07: 800 mg via ORAL
  Filled 2013-06-06 (×2): qty 4

## 2013-06-06 MED ORDER — FLUOXETINE HCL 20 MG PO TABS
20.0000 mg | ORAL_TABLET | Freq: Every morning | ORAL | Status: DC
  Administered 2013-06-06 – 2013-06-09 (×4): 20 mg via ORAL
  Filled 2013-06-06 (×4): qty 1

## 2013-06-06 MED ORDER — MUPIROCIN 2 % EX OINT
1.0000 "application " | TOPICAL_OINTMENT | Freq: Two times a day (BID) | CUTANEOUS | Status: DC
  Administered 2013-06-06 – 2013-06-09 (×7): 1 via NASAL
  Filled 2013-06-06 (×2): qty 22

## 2013-06-06 MED ORDER — NITROGLYCERIN 0.4 MG SL SUBL: 0.4000 mg | SUBLINGUAL_TABLET | SUBLINGUAL | Status: DC | PRN

## 2013-06-06 MED ORDER — MORPHINE SULFATE 4 MG/ML IJ SOLN
4.0000 mg | Freq: Once | INTRAMUSCULAR | Status: AC
  Administered 2013-06-06: 4 mg via INTRAVENOUS
  Filled 2013-06-06: qty 1

## 2013-06-06 MED ORDER — AMIODARONE HCL 200 MG PO TABS
200.0000 mg | ORAL_TABLET | Freq: Two times a day (BID) | ORAL | Status: DC
  Administered 2013-06-06 – 2013-06-09 (×7): 200 mg via ORAL
  Filled 2013-06-06 (×11): qty 1

## 2013-06-06 MED ORDER — AQUAPHOR EX OINT: 1.0000 "application " | TOPICAL_OINTMENT | Freq: Every day | CUTANEOUS | Status: DC | PRN

## 2013-06-06 MED ORDER — ASPIRIN 81 MG PO CHEW
324.0000 mg | CHEWABLE_TABLET | Freq: Once | ORAL | Status: DC
  Filled 2013-06-06: qty 4

## 2013-06-06 MED ORDER — AMLODIPINE BESYLATE 2.5 MG PO TABS
2.5000 mg | ORAL_TABLET | Freq: Every day | ORAL | Status: DC
  Administered 2013-06-06 – 2013-06-07 (×2): 2.5 mg via ORAL
  Filled 2013-06-06 (×2): qty 1

## 2013-06-06 MED ORDER — FLUTICASONE PROPIONATE 50 MCG/ACT NA SUSP
2.0000 | Freq: Every day | NASAL | Status: DC
  Administered 2013-06-06 – 2013-06-09 (×3): 2 via NASAL
  Filled 2013-06-06 (×2): qty 16

## 2013-06-06 MED ORDER — NITROGLYCERIN IN D5W 200-5 MCG/ML-% IV SOLN
5.0000 ug/min | INTRAVENOUS | Status: DC
  Administered 2013-06-06 (×2): 5 ug/min via INTRAVENOUS
  Administered 2013-06-07: 25 ug/min via INTRAVENOUS
  Filled 2013-06-06 (×2): qty 250

## 2013-06-06 MED ORDER — HEPARIN (PORCINE) IN NACL 100-0.45 UNIT/ML-% IJ SOLN
2200.0000 [IU]/h | INTRAMUSCULAR | Status: DC
  Administered 2013-06-06: 1400 [IU]/h via INTRAVENOUS
  Administered 2013-06-06 (×2): 1800 [IU]/h via INTRAVENOUS
  Administered 2013-06-07 – 2013-06-08 (×3): 2200 [IU]/h via INTRAVENOUS
  Filled 2013-06-06 (×11): qty 250

## 2013-06-06 MED ORDER — LISINOPRIL 5 MG PO TABS
5.0000 mg | ORAL_TABLET | Freq: Every day | ORAL | Status: DC
  Administered 2013-06-06 – 2013-06-08 (×3): 5 mg via ORAL
  Filled 2013-06-06 (×6): qty 1

## 2013-06-06 MED ORDER — PANTOPRAZOLE SODIUM 40 MG PO TBEC
40.0000 mg | DELAYED_RELEASE_TABLET | Freq: Every day | ORAL | Status: DC
  Administered 2013-06-06 – 2013-06-09 (×4): 40 mg via ORAL
  Filled 2013-06-06 (×4): qty 1

## 2013-06-06 MED ORDER — ONDANSETRON HCL 4 MG/2ML IJ SOLN
4.0000 mg | Freq: Once | INTRAMUSCULAR | Status: AC
  Administered 2013-06-06: 4 mg via INTRAVENOUS
  Filled 2013-06-06: qty 2

## 2013-06-06 MED ORDER — HYDRALAZINE HCL 20 MG/ML IJ SOLN: 10.0000 mg | Freq: Four times a day (QID) | INTRAMUSCULAR | Status: DC | PRN

## 2013-06-06 MED ORDER — CHLORHEXIDINE GLUCONATE CLOTH 2 % EX PADS
6.0000 | MEDICATED_PAD | Freq: Every day | CUTANEOUS | Status: DC
  Administered 2013-06-06 – 2013-06-09 (×4): 6 via TOPICAL

## 2013-06-06 MED ORDER — HYDRALAZINE HCL 20 MG/ML IJ SOLN
INTRAMUSCULAR | Status: AC
  Administered 2013-06-06: 10 mg
  Filled 2013-06-06: qty 1

## 2013-06-06 MED ORDER — MORPHINE SULFATE 4 MG/ML IJ SOLN
4.0000 mg | INTRAMUSCULAR | Status: DC | PRN
  Administered 2013-06-06 (×3): 4 mg via INTRAVENOUS
  Filled 2013-06-06 (×3): qty 1

## 2013-06-06 NOTE — Progress Notes (Signed)
[facility] PROGRESS NOTE  [name] HCW:[id number] DOB: 05-15-1945 DOA: 06/05/2013 PCP: [name], MD  Assessment/Plan: Principal Problem:   Chest pain:  Appreciate cardiology assistance.  For cath, likely Monday?  Active Problems:   HYPERTENSION, BENIGN: Pressures slightly elevated.  Added prn hydralazine.    Morbid obesity: Pt meets criteria given BMI of 41    Chronic diastolic heart failure: Interestingly, pt's echo last Nov notes no systolic & diastolic dysfunction    OSA (obstructive sleep apnea): Stable, nightly bipap    Borderline diabetes: A1C under 7.  Monitor CBG's while here, no sliding scale unless CBG's start to rise.  Code Status: Full code Family Communication: Wife at the bedside Disposition Plan: Continue in stepdown, given concerns for unstable angina   Consultants:  [facility]  Procedures:  For cath  Antibiotics:  None  HPI/Subjective: Pt feeling ok, still some chest pain, but minimal.    Objective: Filed Vitals:   06/06/13 1230  BP: 169/84  Pulse: 61  Temp:   Resp: 22    Intake/Output Summary (Last 24 hours) at 06/06/13 1330 Last data filed at 06/06/13 1200  Gross per 24 hour  Intake 362.13 ml  Output    600 ml  Net -237.87 ml   Filed Weights   06/06/13 0330  Weight: 130.4 kg (287 lb 7.7 oz)    Exam:   General:  A&O x 3, NAD  Cardiovascular: RRR S1S2, II/VI SEM  Respiratory: CTA Bilaterally, decreased breath sounds throughout secondary to body habitus  Abdomen: Soft, NT, distended, +BS  Musculoskeletal: Trace pitting edema bilaterally  Data Reviewed: Basic Metabolic Panel:  Recent Labs Lab 06/05/13 2308  NA 143  K 4.3  CL 104  CO2 25  GLUCOSE 209*  BUN 17  CREATININE 0.93  CALCIUM 9.0   Liver Function Tests:  Recent Labs Lab 06/05/13 2308  AST 34  ALT 41  ALKPHOS 133*  BILITOT 0.5  PROT 7.3  ALBUMIN 3.3*   No results found for this basename: LIPASE, AMYLASE,  in the last 168  hours No results found for this basename: AMMONIA,  in the last 168 hours CBC:  Recent Labs Lab 06/05/13 2308  WBC 7.6  NEUTROABS 5.1  HGB 13.1  HCT 37.8*  MCV 89.6  PLT 216   Cardiac Enzymes:  Recent Labs Lab 06/05/13 2309 06/06/13 0425 06/06/13 0810  TROPONINI <0.30 <0.30 <0.30   BNP (last 3 results)  Recent Labs  02/02/13 1301  PROBNP 1091.0*   CBG: No results found for this basename: GLUCAP,  in the last 168 hours  Recent Results (from the past 240 hour(s))  MRSA PCR SCREENING     Status: Abnormal   Collection Time    06/06/13  3:01 AM      Result Value Ref Range Status   MRSA by PCR POSITIVE (*) NEGATIVE Final   Comment:            The GeneXpert MRSA Assay (FDA     approved for NASAL specimens     only), is one component of a     comprehensive MRSA colonization     surveillance program. It is not     intended to diagnose MRSA     infection nor to guide or     monitor treatment for     MRSA infections.     RESULT CALLED TO, READ BACK BY AND VERIFIED WITH:     CALLED TO RN [place] HODGIN [id number] @0552  THANEY  Studies: Dg Chest Portable 1 View  06/06/2013   CLINICAL DATA:  Chest pain and shortness of breath  EXAM: PORTABLE CHEST - 1 VIEW  COMPARISON:  02/02/2013  FINDINGS: Chronic cardiomegaly. No edema or consolidation. No effusion or pneumothorax.  IMPRESSION: No edema or consolidation.   Electronically Signed   By: Jorje Guild M.D.   On: 06/06/2013 00:50    Scheduled Meds: . amiodarone  200 mg Oral BID  . amLODipine  2.5 mg Oral Daily  . Chlorhexidine Gluconate Cloth  6 each Topical Q0600  . FLUoxetine  20 mg Oral q morning - 10a  . fluticasone  2 spray Each Nare Daily  . lisinopril  5 mg Oral QHS  . mupirocin ointment  1 application Nasal BID  . pantoprazole  40 mg Oral Daily   Continuous Infusions: . sodium chloride    . heparin 1,400 Units/hr (06/06/13 0930)  . nitroGLYCERIN 10 mcg/min (06/06/13 0755)    Principal Problem:    Chest pain Active Problems:   HYPERTENSION, BENIGN   Morbid obesity   Chronic diastolic heart failure   OSA (obstructive sleep apnea)   Borderline diabetes    Time spent: 20 min    [facility] Pager [phone]. If 7PM-7AM, please contact night-coverage at [email or web address], password [facility] 06/06/2013, 1:30 PM  LOS: 1 day
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
## 2013-06-06 NOTE — Progress Notes (Signed)
Patient c/o intermittant chest pain 6/10. Patient appears comfortable. No SOB or diaphoresis noted. Vitals remain stable. Nitro titrated as per [facility] and Dr. Maryland Pink notified. Patient given 4mg  morphine with some improvement of pain.  EKG obtained with no changes from this am. Dr. Maryland Pink stated still OK to move patient to stepdown as patient was admitted to ICU as stepdown overflow. Will continue to monitor patient closely.

## 2013-06-06 NOTE — Progress Notes (Signed)
[place] for Heparin  Indication: chest pain/ACS  Allergies  Allergen Reactions  . 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 Zithromax [Azithromycin Dihydrate] Other (See Comments)    Swelling (arms/legs/scotrum)   Patient Measurements: Dosing weight: ~103kg  Vital Signs: Temp: 98.1 F (36.7 C) (03/07 1600) Temp src: Oral (03/07 1600) BP: 131/57 mmHg (03/07 1700) Pulse Rate: 52 (03/07 1700)  Labs:  Recent Labs  06/05/13 2308  06/05/13 2309 06/06/13 0425 06/06/13 0810 06/06/13 1430 06/06/13 1600  HGB 13.1  --   --   --   --   --   --   HCT 37.8*  --   --   --   --   --   --   PLT 216  --   --   --   --   --   --   APTT  --   --   --  39*  --   --   --   LABPROT  --   --   --  15.5*  --   --   --   INR  --   --   --  1.26  --   --   --   HEPARINUNFRC  --   --   --  <0.10*  --   --  <0.10*  CREATININE 0.93  --   --   --   --   --   --   TROPONINI  --   < > <0.30 <0.30 <0.30 <0.30  --   < > = values in this interval not displayed. Assessment: 68 y/o M here with CP, Pradaxa PTA for afib/flutter (last dose: 3/6 at 2130).  First heparin level is <0.1 on 1400 units/hr.    Goal of Therapy:  Heparin level 0.3-0.7 units/ml Monitor platelets by anticoagulation protocol: Yes   Plan:  -Heparin 4000 units IV x 1, then increase heparin drip to 1800 units/hr -heparin level 6 hours later -Daily CBC/HL -Monitor for bleeding  Candie Mile 06/06/2013,6:23 PM

## 2013-06-07 DIAGNOSIS — I5033 Acute on chronic diastolic (congestive) heart failure: Secondary | ICD-10-CM

## 2013-06-07 DIAGNOSIS — I509 Heart failure, unspecified: Secondary | ICD-10-CM

## 2013-06-07 LAB — CBC
HEMATOCRIT: 34.9 % — AB (ref 39.0–52.0)
Hemoglobin: 11.9 g/dL — ABNORMAL LOW (ref 13.0–17.0)
MCH: 30.6 pg (ref 26.0–34.0)
MCHC: 34.1 g/dL (ref 30.0–36.0)
MCV: 89.7 fL (ref 78.0–100.0)
PLATELETS: 221 10*3/uL (ref 150–400)
RBC: 3.89 MIL/uL — AB (ref 4.22–5.81)
RDW: 14.2 % (ref 11.5–15.5)
WBC: 9.7 10*3/uL (ref 4.0–10.5)

## 2013-06-07 LAB — HEPARIN LEVEL (UNFRACTIONATED)
Heparin Unfractionated: 0.26 IU/mL — ABNORMAL LOW (ref 0.30–0.70)
Heparin Unfractionated: 0.29 IU/mL — ABNORMAL LOW (ref 0.30–0.70)

## 2013-06-07 MED ORDER — SODIUM CHLORIDE 0.9 % IV SOLN
1.0000 mL/kg/h | INTRAVENOUS | Status: DC
  Administered 2013-06-07 – 2013-06-08 (×3): 1 mL/kg/h via INTRAVENOUS

## 2013-06-07 MED ORDER — ASPIRIN 81 MG PO CHEW
81.0000 mg | CHEWABLE_TABLET | ORAL | Status: AC
  Administered 2013-06-08: 81 mg via ORAL
  Filled 2013-06-07: qty 1

## 2013-06-07 MED ORDER — AMLODIPINE BESYLATE 5 MG PO TABS
5.0000 mg | ORAL_TABLET | Freq: Every day | ORAL | Status: DC
  Administered 2013-06-08 – 2013-06-09 (×2): 5 mg via ORAL
  Filled 2013-06-07 (×2): qty 1

## 2013-06-07 MED ORDER — HYDROMORPHONE HCL PF 1 MG/ML IJ SOLN
1.0000 mg | Freq: Once | INTRAMUSCULAR | Status: AC
Start: 2013-06-07 — End: 2013-06-07
  Administered 2013-06-07: 1 mg via INTRAVENOUS
  Filled 2013-06-07: qty 1

## 2013-06-07 MED ORDER — SODIUM CHLORIDE 0.9 % IV SOLN: 250.0000 mL | INTRAVENOUS | Status: DC | PRN

## 2013-06-07 MED ORDER — FENTANYL CITRATE 0.05 MG/ML IJ SOLN
25.0000 ug | Freq: Once | INTRAMUSCULAR | Status: AC
  Administered 2013-06-07: 25 ug via INTRAVENOUS
  Filled 2013-06-07: qty 2

## 2013-06-07 MED ORDER — SODIUM CHLORIDE 0.9 % IJ SOLN: 3.0000 mL | INTRAMUSCULAR | Status: DC | PRN

## 2013-06-07 MED ORDER — HYDROMORPHONE HCL PF 1 MG/ML IJ SOLN: 1.0000 mg | INTRAMUSCULAR | Status: DC | PRN

## 2013-06-07 MED ORDER — SODIUM CHLORIDE 0.9 % IJ SOLN
3.0000 mL | Freq: Two times a day (BID) | INTRAMUSCULAR | Status: DC
  Administered 2013-06-07 (×2): 3 mL via INTRAVENOUS

## 2013-06-07 NOTE — Progress Notes (Signed)
Patient Name: [name] Date of Encounter: 06/07/2013  Principal Problem:   Chest pain Active Problems:   HYPERTENSION, BENIGN   Morbid obesity   Chronic diastolic heart failure   OSA (obstructive sleep apnea)   Borderline diabetes   Length of Stay: 2  SUBJECTIVE  Angina yesterday around 1745h - subtle ST depression in leads I and aVL. Resolved with NTG and morphine. Severe chest pain last night - ST segments were back to baseline, rhythm is accelerated junctional. Received Dilaudid. In AF with VR 75 bpm since 8AM - no futher angina, but has a bad headache.  CURRENT MEDS . amiodarone  200 mg Oral BID  . amLODipine  2.5 mg Oral Daily  . Chlorhexidine Gluconate Cloth  6 each Topical Q0600  . FLUoxetine  20 mg Oral q morning - 10a  . fluticasone  2 spray Each Nare Daily  . lisinopril  5 mg Oral QHS  . mupirocin ointment  1 application Nasal BID  . pantoprazole  40 mg Oral Daily    OBJECTIVE   Intake/Output Summary (Last 24 hours) at 06/07/13 0928 Last data filed at 06/07/13 0800  Gross per 24 hour  Intake 1623.93 ml  Output    700 ml  Net 923.93 ml   Filed Weights   06/06/13 0330  Weight: 130.4 kg (287 lb 7.7 oz)    PHYSICAL EXAM Filed Vitals:   06/07/13 0700 06/07/13 0719 06/07/13 0721 06/07/13 0849  BP: 108/48  102/62 121/56  Pulse: 60  61 75  Temp:  99.4 F (37.4 C) 97.4 F (36.3 C)   TempSrc:  Oral Oral   Resp: 22  19 19   Height:      Weight:      SpO2: 92%  99% 100%   General: Alert, oriented x3, no distress Head: no evidence of trauma, PERRL, EOMI, no exophtalmos or lid lag, no myxedema, no xanthelasma; normal ears, nose and oropharynx Neck: normal jugular venous pulsations and no hepatojugular reflux; brisk carotid pulses without delay and no carotid bruits Chest: clear to auscultation, no signs of consolidation by percussion or palpation, normal fremitus, symmetrical and full respiratory excursions Cardiovascular: normal position and quality of  the apical impulse, regular rhythm, normal first and second heart sounds, no rubs or gallops, no murmur Abdomen: no tenderness or distention, no masses by palpation, no abnormal pulsatility or arterial bruits, normal bowel sounds, no hepatosplenomegaly Extremities: no clubbing, cyanosis or edema; 2+ radial, ulnar and brachial pulses bilaterally; 2+ right femoral, posterior tibial and dorsalis pedis pulses; 2+ left femoral, posterior tibial and dorsalis pedis pulses; no subclavian or femoral bruits Neurological: grossly nonfocal  LABS  CBC  Recent Labs  06/05/13 2308 06/07/13 0112  WBC 7.6 9.7  NEUTROABS 5.1  --   HGB 13.1 11.9*  HCT 37.8* 34.9*  MCV 89.6 89.7  PLT 216 161   Basic Metabolic Panel  Recent Labs  06/05/13 2308  NA 143  K 4.3  CL 104  CO2 25  GLUCOSE 209*  BUN 17  CREATININE 0.93  CALCIUM 9.0   Liver Function Tests  Recent Labs  06/05/13 2308  AST 34  ALT 41  ALKPHOS 133*  BILITOT 0.5  PROT 7.3  ALBUMIN 3.3*   Cardiac Enzymes  Recent Labs  06/06/13 0425 06/06/13 0810 06/06/13 1430  TROPONINI <0.30 <0.30 <0.30    Radiology Studies Imaging results have been reviewed and Dg Chest Portable 1 View  06/06/2013   CLINICAL DATA:  Chest pain and shortness  of breath  EXAM: PORTABLE CHEST - 1 VIEW  COMPARISON:  02/02/2013  FINDINGS: Chronic cardiomegaly. No edema or consolidation. No effusion or pneumothorax.  IMPRESSION: No edema or consolidation.   Electronically Signed   By: Jorje Guild M.D.   On: 06/06/2013 00:50    TELE Accelerated junctional rhythm overnight AF with controlled rate since 8 AM  ECG Subtle dynamic ST depression in I and aVL during chest pain episode  ASSESSMENT AND PLAN  Unstable angina without enzyme release, but with dynamic ECG changes in a patient with moderate LAD and RCA disease by cath 2006. For coronary angio +/- PCI tomorrow. Increase amlodipine. This procedure has been fully reviewed with the patient and written  informed consent has been obtained.  Recurrent persistent AF symptoms of angina have occurred both while in sinus brady, junctional rhythm and AF and do not appear causal. Avoid beta blockers due to junctional rhythm/sinus brady. Continue amiodarone. On IV heparin in anticipation of cath. Chronically on Pradaxa.  Consider ranolazine, depending on results of cath.   Sanda Klein, MD, [facility] [phone] office [phone] pager 06/07/2013 9:28 AM

## 2013-06-07 NOTE — Progress Notes (Signed)
Pt sent home cpap back home bc we dont carry nasal pillows.  Pt says he will do fine without it while here.  Rt explained we can bring one if he changes his mind.  Rt will continue to monitor.

## 2013-06-07 NOTE — Progress Notes (Signed)
Pt refused to wear CPAP tonight because he did not like the face mask that RT brought him. Pt did not bring his personal nasal mask from home because it has a hole in it. O2 95% on 2L [place]. Will continue to monitor pt.

## 2013-06-07 NOTE — Progress Notes (Signed)
The patient stable. Chest pain-free. Occasional episodes of atrial fibrillation. For cardiac catheterization tomorrow!  Spoke with Dr.Croitoru.  With almost all issues being cardiac in nature, [facility] will kindly take patient on their service.

## 2013-06-07 NOTE — Progress Notes (Signed)
Pt complained of 10/10 H/A, he refused Tylenol and was given Morphine 4mg . After 91mins pt continued to complain of 10/10 H/A and chest pain radiating down both arms. Nurse notify Cardiology MD, order for [facility] 25 mcg was given and Nitro increased. Pt had no relief from pain and nurse did an EKG. Cardiology MD was notified, no acute changes were noted in EKG and Cardiology instructed to notify [facility]. [facility] ordered Dilaudid 1mg  and Nitro increased. Pt is now resting comfortably and has no complaints of H/A or chest pain, vital signs remain stable. Nurse will continue to monitor.
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
## 2013-06-08 ENCOUNTER — Encounter (HOSPITAL_COMMUNITY)
Admission: EM | Disposition: A | Discharge status: Home / Self Care | Payer: Self-pay | Source: Home / Self Care | Attending: Internal Medicine

## 2013-06-08 DIAGNOSIS — I2510 Atherosclerotic heart disease of native coronary artery without angina pectoris: Secondary | ICD-10-CM

## 2013-06-08 DIAGNOSIS — G4733 Obstructive sleep apnea (adult) (pediatric): Secondary | ICD-10-CM

## 2013-06-08 HISTORY — PX: LEFT HEART CATHETERIZATION WITH CORONARY ANGIOGRAM: SHX5451

## 2013-06-08 LAB — BASIC METABOLIC PANEL
BUN: 12 mg/dL (ref 6–23)
CO2: 24 meq/L (ref 19–32)
Calcium: 8.5 mg/dL (ref 8.4–10.5)
Chloride: 106 mEq/L (ref 96–112)
Creatinine, Ser: 0.71 mg/dL (ref 0.50–1.35)
GFR calc Af Amer: >90 mL/min (ref >90)
Glucose, Bld: 141 mg/dL — ABNORMAL HIGH (ref 70–99)
POTASSIUM: 4.2 meq/L (ref 3.7–5.3)
SODIUM: 142 meq/L (ref 137–147)

## 2013-06-08 LAB — CBC
HCT: 33.8 % — ABNORMAL LOW (ref 39.0–52.0)
Hemoglobin: 11.5 g/dL — ABNORMAL LOW (ref 13.0–17.0)
MCH: 30.7 pg (ref 26.0–34.0)
MCHC: 34 g/dL (ref 30.0–36.0)
MCV: 90.4 fL (ref 78.0–100.0)
PLATELETS: 183 10*3/uL (ref 150–400)
RBC: 3.74 MIL/uL — ABNORMAL LOW (ref 4.22–5.81)
RDW: 13.9 % (ref 11.5–15.5)
WBC: 7.1 10*3/uL (ref 4.0–10.5)

## 2013-06-08 LAB — HEPARIN LEVEL (UNFRACTIONATED): Heparin Unfractionated: 0.45 IU/mL (ref 0.30–0.70)

## 2013-06-08 LAB — HEMOGLOBIN A1C
Hgb A1c MFr Bld: 6.6 % — ABNORMAL HIGH (ref <5.7)
MEAN PLASMA GLUCOSE: 143 mg/dL — AB (ref <117)

## 2013-06-08 SURGERY — LEFT HEART CATHETERIZATION WITH CORONARY ANGIOGRAM
Anesthesia: LOCAL

## 2013-06-08 MED ORDER — HEPARIN (PORCINE) IN NACL 100-0.45 UNIT/ML-% IJ SOLN
2200.0000 [IU]/h | INTRAMUSCULAR | Status: AC
  Administered 2013-06-09: 2200 [IU]/h via INTRAVENOUS
  Filled 2013-06-08 (×2): qty 250

## 2013-06-08 MED ORDER — SODIUM CHLORIDE 0.9 % IV SOLN: INTRAVENOUS | Status: AC

## 2013-06-08 MED ORDER — NITROGLYCERIN 0.2 MG/ML ON CALL CATH LAB
INTRAVENOUS | Status: AC
  Filled 2013-06-08: qty 1

## 2013-06-08 MED ORDER — LIDOCAINE HCL (PF) 1 % IJ SOLN
INTRAMUSCULAR | Status: AC
  Filled 2013-06-08: qty 30

## 2013-06-08 MED ORDER — FUROSEMIDE 10 MG/ML IJ SOLN
40.0000 mg | Freq: Two times a day (BID) | INTRAMUSCULAR | Status: DC
  Administered 2013-06-08 – 2013-06-09 (×2): 40 mg via INTRAVENOUS
  Filled 2013-06-08 (×4): qty 4

## 2013-06-08 MED ORDER — HEPARIN (PORCINE) IN NACL 2-0.9 UNIT/ML-% IJ SOLN
INTRAMUSCULAR | Status: AC
  Filled 2013-06-08: qty 1000

## 2013-06-08 MED ORDER — FENTANYL CITRATE 0.05 MG/ML IJ SOLN
INTRAMUSCULAR | Status: AC
  Filled 2013-06-08: qty 2

## 2013-06-08 MED ORDER — MIDAZOLAM HCL 2 MG/2ML IJ SOLN
INTRAMUSCULAR | Status: AC
  Filled 2013-06-08: qty 2

## 2013-06-08 MED ORDER — VERAPAMIL HCL 2.5 MG/ML IV SOLN
INTRAVENOUS | Status: AC
  Filled 2013-06-08: qty 2

## 2013-06-08 MED ORDER — HEPARIN (PORCINE) IN NACL 100-0.45 UNIT/ML-% IJ SOLN
2200.0000 [IU]/h | INTRAMUSCULAR | Status: DC
  Filled 2013-06-08: qty 250

## 2013-06-08 MED ORDER — HEPARIN (PORCINE) IN NACL 100-0.45 UNIT/ML-% IJ SOLN
2200.0000 [IU]/h | INTRAMUSCULAR | Status: DC
  Filled 2013-06-08 (×2): qty 250

## 2013-06-08 MED ORDER — HEPARIN SODIUM (PORCINE) 1000 UNIT/ML IJ SOLN
INTRAMUSCULAR | Status: AC
  Filled 2013-06-08: qty 1

## 2013-06-08 MED ORDER — ATORVASTATIN CALCIUM 40 MG PO TABS
40.0000 mg | ORAL_TABLET | Freq: Every day | ORAL | Status: DC
  Filled 2013-06-08 (×3): qty 1

## 2013-06-08 NOTE — Interval H&P Note (Signed)
History and Physical Interval Note:  06/08/2013 2:55 PM  [name]  has presented today for cardiac cath with the diagnosis of chest pain, known CAD.  The various methods of treatment have been discussed with the patient and family. After consideration of risks, benefits and other options for treatment, the patient has consented to  Procedure(s): LEFT HEART CATHETERIZATION WITH CORONARY ANGIOGRAM (N/A) as a surgical intervention .  The patient's history has been reviewed, patient examined, no change in status, stable for surgery.  I have reviewed the patient's chart and labs.  Questions were answered to the patient's satisfaction.    Cath Lab Visit (complete for each Cath Lab visit)  Clinical Evaluation Leading to the Procedure:   ACS: no  Non-ACS:    Anginal Classification: CCS III  Anti-ischemic medical therapy: Minimal Therapy (1 class of medications)  Non-Invasive Test Results: No non-invasive testing performed  Prior CABG: No previous CABG       MCALHANY,CHRISTOPHER

## 2013-06-08 NOTE — Progress Notes (Signed)
Pt. Refuses CPAP at this time due to not having nasal pillows. Pt. Is aware that he can bring his nasal pillows from home. Pt. Was made aware to call RT if he changed his mind & decided to wear CPAP tonight.

## 2013-06-08 NOTE — Progress Notes (Signed)
SUBJECTIVE:  No complaints of CP or SOB at present  OBJECTIVE:   Vitals:   Filed Vitals:   06/08/13 0600 06/08/13 0745 06/08/13 0800 06/08/13 0900  BP: 110/47  155/66 164/68  Pulse: 50  49 50  Temp:  97.8 F (36.6 C)    TempSrc:  Oral    Resp: 14  15 18  Height:      Weight:      SpO2: 99%  100% 97%   I&O's:   Intake/Output Summary (Last 24 hours) at 06/08/13 0948 Last data filed at 06/08/13 0900  Gross per 24 hour  Intake 2995.94 ml  Output   2075 ml  Net 920.94 ml   TELEMETRY: Reviewed telemetry pt in sinus bradycardia     PHYSICAL EXAM General: Well developed, well nourished, in no acute distress Head: Eyes PERRLA, No xanthomas.   Normal cephalic and atramatic  Lungs:   Clear bilaterally to auscultation and percussion. Heart:   HRRR S1 S2 Pulses are 2+ & equal. Abdomen: Bowel sounds are positive, abdomen soft and non-tender without masses Extremities:   No clubbing, cyanosis or edema.  DP +1 Neuro: Alert and oriented X 3. Psych:  Good affect, responds appropriately   LABS: Basic Metabolic Panel:  Recent Labs  06/05/13 2308  NA 143  K 4.3  CL 104  CO2 25  GLUCOSE 209*  BUN 17  CREATININE 0.93  CALCIUM 9.0   Liver Function Tests:  Recent Labs  06/05/13 2308  AST 34  ALT 41  ALKPHOS 133*  BILITOT 0.5  PROT 7.3  ALBUMIN 3.3*   No results found for this basename: LIPASE, AMYLASE,  in the last 72 hours CBC:  Recent Labs  06/05/13 2308 06/07/13 0112 06/08/13 0415  WBC 7.6 9.7 7.1  NEUTROABS 5.1  --   --   HGB 13.1 11.9* 11.5*  HCT 37.8* 34.9* 33.8*  MCV 89.6 89.7 90.4  PLT 216 221 183   Cardiac Enzymes:  Recent Labs  06/06/13 0425 06/06/13 0810 06/06/13 1430  TROPONINI <0.30 <0.30 <0.30   BNP: No components found with this basename: POCBNP,  D-Dimer: No results found for this basename: DDIMER,  in the last 72 hours Hemoglobin A1C: No results found for this basename: HGBA1C,  in the last 72 hours Fasting Lipid Panel: No  results found for this basename: CHOL, HDL, LDLCALC, TRIG, CHOLHDL, LDLDIRECT,  in the last 72 hours Thyroid Function Tests: No results found for this basename: TSH, T4TOTAL, FREET3, T3FREE, THYROIDAB,  in the last 72 hours Anemia Panel: No results found for this basename: VITAMINB12, FOLATE, FERRITIN, TIBC, IRON, RETICCTPCT,  in the last 72 hours Coag Panel:   Lab Results  Component Value Date   INR 1.26 06/06/2013   INR 3.6 07/26/2011   INR 3.3 07/18/2011    RADIOLOGY: Dg Chest Portable 1 View  06/06/2013   CLINICAL DATA:  Chest pain and shortness of breath  EXAM: PORTABLE CHEST - 1 VIEW  COMPARISON:  02/02/2013  FINDINGS: Chronic cardiomegaly. No edema or consolidation. No effusion or pneumothorax.  IMPRESSION: No edema or consolidation.   Electronically Signed   By: Jonathan  Watts M.D.   On: 06/06/2013 00:50   Principal Problem:  Chest pain  Active Problems:  HYPERTENSION, BENIGN  Morbid obesity  Chronic diastolic heart failure  OSA (obstructive sleep apnea)  Borderline diabetes  ASSESSMENT AND PLAN  1.  Unstable angina without enzyme release, but with dynamic ECG changes in a patient with moderate LAD and   RCA disease by cath 2006. For coronary angio +/- PCI today. Amlodipine increased yesterday.  Continue ASA.  It does not look like he has been on a statin in the past for a long while.  He says he stopped it when his cholesterol became normal.  Will start given his known moderate CAD in the past.  Start Lipitor 40mg  daily.  Will need repeat lipids and ALT in 6-8 weeks.  Continue IV Heparin until cath.  No beta blocker due to junctional rhythms and bradycardia.  2.  Recurrent persistent AF symptoms of angina have occurred both while in sinus brady, junctional rhythm and AF and do not appear causal. Avoid beta blockers due to junctional rhythm/sinus brady. Continue amiodarone. On IV heparin in anticipation of cath. Chronically on Pradaxa.  3.  HTN - continue ACE I/amlodipine for BP  control 4.  Chronic diastolic CHF - Lasix on hold for cath - will restart post cath 5.  OSA - on CPAP at home at 15cm H2O 6.  Borderline DM - check HbgA1C      Sueanne Margarita, MD  06/08/2013  9:48 AM

## 2013-06-08 NOTE — Care Management Note (Signed)
    Page 1 of 1   06/08/2013     12:24:28 PM   CARE MANAGEMENT NOTE 06/08/2013  Patient:  [name], [name]   Account Number:  [id number]  Date Initiated:  06/08/2013  Documentation initiated by:  Elissa Hefty  Subjective/Objective Assessment:   adm w ch pain, htn     Action/Plan:   lives w wife, pcp dr [name]   Anticipated DC Date:     Anticipated DC Plan:           Choice offered to / List presented to:             Status of service:   Medicare Important Message given?   (If response is "NO", the following Medicare IM given date fields will be blank) Date Medicare IM given:   Date Additional Medicare IM given:    Discharge Disposition:    Per UR Regulation:  Reviewed for med. necessity/level of care/duration of stay  If discussed at [place] of Stay Meetings, dates discussed:    Comments:

## 2013-06-08 NOTE — H&P (View-Only) (Signed)
SUBJECTIVE:  No complaints of CP or SOB at present  OBJECTIVE:   Vitals:   Filed Vitals:   06/08/13 0600 06/08/13 0745 06/08/13 0800 06/08/13 0900  BP: 110/47  155/66 164/68  Pulse: 50  49 50  Temp:  97.8 F (36.6 C)    TempSrc:  Oral    Resp: 14  15 18   Height:      Weight:      SpO2: 99%  100% 97%   I&O's:   Intake/Output Summary (Last 24 hours) at 06/08/13 0948 Last data filed at 06/08/13 0900  Gross per 24 hour  Intake 2995.94 ml  Output   2075 ml  Net 920.94 ml   TELEMETRY: Reviewed telemetry pt in sinus bradycardia     PHYSICAL EXAM General: Well developed, well nourished, in no acute distress Head: Eyes PERRLA, No xanthomas.   Normal cephalic and atramatic  Lungs:   Clear bilaterally to auscultation and percussion. Heart:   HRRR S1 S2 Pulses are 2+ & equal. Abdomen: Bowel sounds are positive, abdomen soft and non-tender without masses Extremities:   No clubbing, cyanosis or edema.  DP +1 Neuro: Alert and oriented X 3. Psych:  Good affect, responds appropriately   LABS: Basic Metabolic Panel:  Recent Labs  06/05/13 2308  NA 143  K 4.3  CL 104  CO2 25  GLUCOSE 209*  BUN 17  CREATININE 0.93  CALCIUM 9.0   Liver Function Tests:  Recent Labs  06/05/13 2308  AST 34  ALT 41  ALKPHOS 133*  BILITOT 0.5  PROT 7.3  ALBUMIN 3.3*   No results found for this basename: LIPASE, AMYLASE,  in the last 72 hours CBC:  Recent Labs  06/05/13 2308 06/07/13 0112 06/08/13 0415  WBC 7.6 9.7 7.1  NEUTROABS 5.1  --   --   HGB 13.1 11.9* 11.5*  HCT 37.8* 34.9* 33.8*  MCV 89.6 89.7 90.4  PLT 216 221 183   Cardiac Enzymes:  Recent Labs  06/06/13 0425 06/06/13 0810 06/06/13 1430  TROPONINI <0.30 <0.30 <0.30   BNP: No components found with this basename: POCBNP,  D-Dimer: No results found for this basename: DDIMER,  in the last 72 hours Hemoglobin A1C: No results found for this basename: HGBA1C,  in the last 72 hours Fasting Lipid Panel: No  results found for this basename: CHOL, HDL, LDLCALC, TRIG, CHOLHDL, LDLDIRECT,  in the last 72 hours Thyroid Function Tests: No results found for this basename: TSH, T4TOTAL, FREET3, T3FREE, THYROIDAB,  in the last 72 hours Anemia Panel: No results found for this basename: VITAMINB12, FOLATE, FERRITIN, TIBC, IRON, RETICCTPCT,  in the last 72 hours Coag Panel:   Lab Results  Component Value Date   INR 1.26 06/06/2013   INR 3.6 07/26/2011   INR 3.3 07/18/2011    RADIOLOGY: Dg Chest Portable 1 View  06/06/2013   CLINICAL DATA:  Chest pain and shortness of breath  EXAM: PORTABLE CHEST - 1 VIEW  COMPARISON:  02/02/2013  FINDINGS: Chronic cardiomegaly. No edema or consolidation. No effusion or pneumothorax.  IMPRESSION: No edema or consolidation.   Electronically Signed   By: Jorje Guild M.D.   On: 06/06/2013 00:50   Principal Problem:  Chest pain  Active Problems:  HYPERTENSION, BENIGN  Morbid obesity  Chronic diastolic heart failure  OSA (obstructive sleep apnea)  Borderline diabetes  ASSESSMENT AND PLAN  1.  Unstable angina without enzyme release, but with dynamic ECG changes in a patient with moderate LAD and  RCA disease by cath 2006. For coronary angio +/- PCI today. Amlodipine increased yesterday.  Continue ASA.  It does not look like he has been on a statin in the past for a long while.  He says he stopped it when his cholesterol became normal.  Will start given his known moderate CAD in the past.  Start Lipitor 40mg  daily.  Will need repeat lipids and ALT in 6-8 weeks.  Continue IV Heparin until cath.  No beta blocker due to junctional rhythms and bradycardia.  2.  Recurrent persistent AF symptoms of angina have occurred both while in sinus brady, junctional rhythm and AF and do not appear causal. Avoid beta blockers due to junctional rhythm/sinus brady. Continue amiodarone. On IV heparin in anticipation of cath. Chronically on Pradaxa.  3.  HTN - continue ACE I/amlodipine for BP  control 4.  Chronic diastolic CHF - Lasix on hold for cath - will restart post cath 5.  OSA - on CPAP at home at 15cm H2O 6.  Borderline DM - check HbgA1C      Sueanne Margarita, MD  06/08/2013  9:48 AM

## 2013-06-08 NOTE — Progress Notes (Signed)
TR ban removed with no complication, no bleeding, right radial pulse present. Pt tolerated procedure well. Will continue to monitor.  Ferdinand Lango, RN

## 2013-06-08 NOTE — Progress Notes (Signed)
ANTICOAGULATION CONSULT NOTE - Follow Up Consult  Pharmacy Consult for Heparin Indication: chest pain/ACS  Allergies  Allergen Reactions  . 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 Zithromax [Azithromycin Dihydrate] Other (See Comments)    Swelling (arms/legs/scotrum)    Patient Measurements: Height: 5\' 10"  (177.8 cm) Weight: 287 lb 7.7 oz (130.4 kg) IBW/kg (Calculated) : 73 Heparin Dosing Weight: ~103kg  Vital Signs: Temp: 97.8 F (36.6 C) (03/09 0745) Temp src: Oral (03/09 0745) BP: 110/47 mmHg (03/09 0600) Pulse Rate: 50 (03/09 0600)  Labs:  Recent Labs  06/05/13 2308  06/06/13 0425 06/06/13 0810 06/06/13 1430  06/07/13 0112 06/07/13 1007 06/08/13 0415  HGB 13.1  --   --   --   --   --  11.9*  --  11.5*  HCT 37.8*  --   --   --   --   --  34.9*  --  33.8*  PLT 216  --   --   --   --   --  221  --  183  APTT  --   --  39*  --   --   --   --   --   --   LABPROT  --   --  15.5*  --   --   --   --   --   --   INR  --   --  1.26  --   --   --   --   --   --   HEPARINUNFRC  --   --  <0.10*  --   --   < > 0.26* 0.29* 0.45  CREATININE 0.93  --   --   --   --   --   --   --   --   TROPONINI  --   < > <0.30 <0.30 <0.30  --   --   --   --   < > = values in this interval not displayed.  Estimated Creatinine Clearance: 106.1 ml/min (by C-G formula based on Cr of 0.93).   Medications:  Heparin @ 2200 units/hr  Assessment: 67yom continues on heparin for CP awaiting cath today. Heparin level is therapeutic. CBC stable. No bleeding reported.  Goal of Therapy:  Heparin level 0.3-0.7 units/ml Monitor platelets by anticoagulation protocol: Yes   Plan:  1) Continue heparin at 2200 units/hr 2) Follow up after cath 3) Follow up resuming pradaxa for afib/flutter  Deboraha Sprang 06/08/2013,8:27 AM

## 2013-06-08 NOTE — CV Procedure (Signed)
      Cardiac Catheterization Operative Report  [name] [id number] 3/9/20153:36 PM [name], MD  Procedure Performed:  1. Left Heart Catheterization 2. Selective Coronary Angiography 3. Left ventricular angiogram  Operator: Lauree Chandler, MD  Arterial access site:  Right radial artery.   Indication:  68 yo male with history of mild CAD by cath 2006, obesity, OSA, diastolic CHF, HTN, DM admitted with chest pain concerning for angina.                                    Procedure Details: The risks, benefits, complications, treatment options, and expected outcomes were discussed with the patient. The patient and/or family concurred with the proposed plan, giving informed consent. The patient was brought to the cath lab after IV hydration was begun and oral premedication was given. The patient was further sedated with Versed and Fentanyl. The right wrist was assessed with an Allens test which was positive. The right wrist was prepped and draped in a sterile fashion. 1% lidocaine was used for local anesthesia. Using the modified Seldinger access technique, a 5 French sheath was placed in the right radial artery. 3 mg Verapamil was given through the sheath. 6000 units IV heparin was given. Standard diagnostic catheters were used to perform selective coronary angiography. A pigtail catheter was used to perform a left ventricular angiogram. The sheath was removed from the right radial artery and a Terumo hemostasis band was applied at the arteriotomy site on the right wrist.    There were no immediate complications. The patient was taken to the recovery area in stable condition.   Hemodynamic Findings: Central aortic pressure: 158/77 Left ventricular pressure: 157/17/31  Angiographic Findings:  Left main: No obstructive disease.   Left Anterior Descending Artery: Large caliber vessel that courses to the apex. The proximal vessel has a 30% stenosis. The mid vessel has diffuse  30% stenosis. The distal vessel has no obstructive disease. The diagonal branch is large in caliber with mild ostial stenosis.   Circumflex Artery: Large caliber vessel with two large caliber obtuse marginal branches. The first obtuse marginal branch has 30% proximal stenosis. The second obtuse marginal branch has 30-40% proximal stenosis. The mid and distal AV groove Circumflex terminates into a posterolateral branch without any significant disease noted in this segment of the vessel.   Right Coronary Artery: Large dominant vessel with no significant disease in the proximal, mid or distal vessel. The posterolateral branch has a 40% stenosis.   Left Ventricular Angiogram: LVEF=55-60%  Impression: 1. Mild non-obstructive CAD 2. Normal LV systolic function  Recommendations: Aggressive risk factor modification for mild CAD. Continue ASA, statin. Resume heparin 6 hours post sheath pull and resume Pradaxa tomorrow if radial cath site stable.        Complications:  None. The patient tolerated the procedure well.

## 2013-06-09 ENCOUNTER — Telehealth: Payer: Self-pay | Admitting: Internal Medicine

## 2013-06-09 ENCOUNTER — Inpatient Hospital Stay (HOSPITAL_COMMUNITY): Admission: RE | Admit: 2013-06-09 | Payer: Medicare Other | Source: Ambulatory Visit

## 2013-06-09 DIAGNOSIS — I4729 Other ventricular tachycardia: Secondary | ICD-10-CM

## 2013-06-09 DIAGNOSIS — I472 Ventricular tachycardia: Secondary | ICD-10-CM

## 2013-06-09 LAB — BASIC METABOLIC PANEL
BUN: 11 mg/dL (ref 6–23)
CALCIUM: 9.1 mg/dL (ref 8.4–10.5)
CO2: 27 mEq/L (ref 19–32)
CREATININE: 0.74 mg/dL (ref 0.50–1.35)
Chloride: 99 mEq/L (ref 96–112)
GFR calc Af Amer: >90 mL/min (ref >90)
GFR calc non Af Amer: >90 mL/min (ref >90)
GLUCOSE: 153 mg/dL — AB (ref 70–99)
Potassium: 3.8 mEq/L (ref 3.7–5.3)
Sodium: 142 mEq/L (ref 137–147)

## 2013-06-09 LAB — CBC
HCT: 37.7 % — ABNORMAL LOW (ref 39.0–52.0)
Hemoglobin: 12.9 g/dL — ABNORMAL LOW (ref 13.0–17.0)
MCH: 30.5 pg (ref 26.0–34.0)
MCHC: 34.2 g/dL (ref 30.0–36.0)
MCV: 89.1 fL (ref 78.0–100.0)
PLATELETS: 223 10*3/uL (ref 150–400)
RBC: 4.23 MIL/uL (ref 4.22–5.81)
RDW: 13.8 % (ref 11.5–15.5)
WBC: 7 10*3/uL (ref 4.0–10.5)

## 2013-06-09 LAB — TSH: TSH: 3.216 u[IU]/mL (ref 0.350–4.500)

## 2013-06-09 LAB — HEPARIN LEVEL (UNFRACTIONATED): HEPARIN UNFRACTIONATED: 0.34 [IU]/mL (ref 0.30–0.70)

## 2013-06-09 MED ORDER — AMIODARONE HCL 200 MG PO TABS: 200.0000 mg | ORAL_TABLET | Freq: Every day | ORAL | Status: DC

## 2013-06-09 MED ORDER — DABIGATRAN ETEXILATE MESYLATE 150 MG PO CAPS
150.0000 mg | ORAL_CAPSULE | Freq: Two times a day (BID) | ORAL | Status: DC
  Administered 2013-06-09: 150 mg via ORAL
  Filled 2013-06-09 (×2): qty 1

## 2013-06-09 MED ORDER — ATORVASTATIN CALCIUM 40 MG PO TABS: 40.0000 mg | ORAL_TABLET | Freq: Every day | ORAL | Status: DC

## 2013-06-09 MED ORDER — AMLODIPINE BESYLATE 5 MG PO TABS: 5.0000 mg | ORAL_TABLET | Freq: Every day | ORAL | Status: DC

## 2013-06-09 NOTE — Discharge Instructions (Signed)
Cardiac Diet °This diet can help prevent heart disease and stroke. Many factors influence your heart health, including eating and exercise habits. Coronary risk rises a lot with abnormal blood fat (lipid) levels. Cardiac meal planning includes limiting unhealthy fats, increasing healthy fats, and making other small dietary changes. General guidelines are as follows: °· Adjust calorie intake to reach and maintain desirable body weight. °· Limit total fat intake to less than 30% of total calories. Saturated fat should be less than 7% of calories. °· Saturated fats are found in animal products and in some vegetable products. Saturated vegetable fats are found in coconut oil, cocoa butter, palm oil, and palm kernel oil. Read labels carefully to avoid these products as much as possible. Use butter in moderation. Choose tub margarines and oils that have 2 grams of fat or less. Good cooking oils are canola and olive oils. °· Practice low-fat cooking techniques. Do not fry food. Instead, broil, bake, boil, steam, grill, roast on a rack, stir-fry, or microwave it. Other fat reducing suggestions include: °· Remove the skin from poultry. °· Remove all visible fat from meats. °· Skim the fat off stews, soups, and gravies before serving them. °· Steam vegetables in water or broth instead of sautéing them in fat. °· Avoid foods with trans fat (or hydrogenated oils), such as commercially fried foods and commercially baked goods. Commercial shortening and deep-frying fats will contain trans fat. °· Increase intake of fruits, vegetables, whole grains, and legumes to replace foods high in fat. °· Increase consumption of nuts, legumes, and seeds to at least 4 servings weekly. One serving of a legume equals ½ cup, and 1 serving of nuts or seeds equals ¼ cup. °· Choose whole grains more often. Have 3 servings per day (a serving is 1 ounce [oz]). °· Eat 4 to 5 servings of vegetables per day. A serving of vegetables is 1 cup of raw leafy  vegetables; ½ cup of raw or cooked cut-up vegetables; ½ cup of vegetable juice. °· Eat 4 to 5 servings of fruit per day. A serving of fruit is 1 medium whole fruit; ¼ cup of dried fruit; ½ cup of fresh, frozen, or canned fruit; ½ cup of 100% fruit juice. °· Increase your intake of dietary fiber to 20 to 30 grams per day. Insoluble fiber may help lower your risk of heart disease and may help curb your appetite.  °Soluble fiber binds cholesterol to be removed from the blood. Foods high in soluble fiber are dried beans, citrus fruits, oats, apples, bananas, broccoli, Brussels sprouts, and eggplant. °· Try to include foods fortified with plant sterols or stanols, such as yogurt, breads, juices, or margarines. Choose several fortified foods to achieve a daily intake of 2 to 3 grams of plant sterols or stanols. °· Foods with omega-3 fats can help reduce your risk of heart disease. Aim to have a 3.5 oz portion of fatty fish twice per week, such as salmon, mackerel, albacore tuna, sardines, lake trout, or herring. If you wish to take a fish oil supplement, choose one that contains 1 gram of both DHA and EPA. °· Limit processed meats to 2 servings (3 oz portion) weekly. °· Limit the sodium in your diet to 1500 milligrams (mg) per day. If you have high blood pressure, talk to a registered dietitian about a DASH (Dietary Approaches to Stop Hypertension) eating plan. °· Limit sweets and beverages with added sugar, such as soda, to no more than 5 servings per week. One   serving is:   °· 1 tablespoon sugar. °· 1 tablespoon jelly or jam. °· ½ cup sorbet. °· 1 cup lemonade. °· ½ cup regular soda. °CHOOSING FOODS °Starches °· Allowed: Breads: All kinds (wheat, rye, raisin, white, oatmeal, Italian, French, and English muffin bread). Low-fat rolls: English muffins, frankfurter and hamburger buns, bagels, pita bread, tortillas (not fried). Pancakes, waffles, biscuits, and muffins made with recommended oil. °· Avoid: Products made with  saturated or trans fats, oils, or whole milk products. Butter rolls, cheese breads, croissants. Commercial doughnuts, muffins, sweet rolls, biscuits, waffles, pancakes, store-bought mixes. °Crackers °· Allowed: Low-fat crackers and snacks: Animal, graham, rye, saltine (with recommended oil, no lard), oyster, and matzo crackers. Bread sticks, melba toast, rusks, flatbread, pretzels, and light popcorn. °· Avoid: High-fat crackers: cheese crackers, butter crackers, and those made with coconut, palm oil, or trans fat (hydrogenated oils). Buttered popcorn. °Cereals °· Allowed: Hot or cold whole-grain cereals. °· Avoid: Cereals containing coconut, hydrogenated vegetable fat, or animal fat. °Potatoes / Pasta / Rice °· Allowed: All kinds of potatoes, rice, and pasta (such as macaroni, spaghetti, and noodles). °· Avoid: Pasta or rice prepared with cream sauce or high-fat cheese. Chow mein noodles, French fries. °Vegetables °· Allowed: All vegetables and vegetable juices. °· Avoid: Fried vegetables. Vegetables in cream, butter, or high-fat cheese sauces. Limit coconut. Fruit in cream or custard. °Protein °· Allowed: Limit your intake of meat, seafood, and poultry to no more than 6 oz (cooked weight) per day. All lean, well-trimmed beef, veal, pork, and lamb. All chicken and turkey without skin. All fish and shellfish. Wild game: wild duck, rabbit, pheasant, and venison. Egg whites or low-cholesterol egg substitutes may be used as desired. Meatless dishes: recipes with dried beans, peas, lentils, and tofu (soybean curd). Seeds and nuts: all seeds and most nuts. °· Avoid: Prime grade and other heavily marbled and fatty meats, such as short ribs, spare ribs, rib eye roast or steak, frankfurters, sausage, bacon, and high-fat luncheon meats, mutton. Caviar. Commercially fried fish. Domestic duck, goose, venison sausage. Organ meats: liver, gizzard, heart, chitterlings, brains, kidney, sweetbreads. °Dairy °· Allowed: Low-fat  cheeses: nonfat or low-fat cottage cheese (1% or 2% fat), cheeses made with part skim milk, such as mozzarella, farmers, string, or ricotta. (Cheeses should be labeled no more than 2 to 6 grams fat per oz.). Skim (or 1%) milk: liquid, powdered, or evaporated. Buttermilk made with low-fat milk. Drinks made with skim or low-fat milk or cocoa. Chocolate milk or cocoa made with skim or low-fat (1%) milk. Nonfat or low-fat yogurt. °· Avoid: Whole milk cheeses, including colby, cheddar, muenster, Monterey Jack, Havarti, Brie, Camembert, American, Swiss, and blue. Creamed cottage cheese, cream cheese. Whole milk and whole milk products, including buttermilk or yogurt made from whole milk, drinks made from whole milk. Condensed milk, evaporated whole milk, and 2% milk. °Soups and Combination Foods °· Allowed: Low-fat low-sodium soups: broth, dehydrated soups, homemade broth, soups with the fat removed, homemade cream soups made with skim or low-fat milk. Low-fat spaghetti, lasagna, chili, and Spanish rice if low-fat ingredients and low-fat cooking techniques are used. °· Avoid: Cream soups made with whole milk, cream, or high-fat cheese. All other soups. °Desserts and Sweets °· Allowed: Sherbet, fruit ices, gelatins, meringues, and angel food cake. Homemade desserts with recommended fats, oils, and milk products. Jam, jelly, honey, marmalade, sugars, and syrups. Pure sugar candy, such as gum drops, hard candy, jelly beans, marshmallows, mints, and small amounts of dark chocolate. °· Avoid: Commercially prepared   cakes, pies, cookies, frosting, pudding, or mixes for these products. Desserts containing whole milk products, chocolate, coconut, lard, palm oil, or palm kernel oil. Ice cream or ice cream drinks. Candy that contains chocolate, coconut, butter, hydrogenated fat, or unknown ingredients. Buttered syrups. Fats and Oils  Allowed: Vegetable oils: safflower, sunflower, corn, soybean, cottonseed, sesame, canola, olive,  or peanut. Non-hydrogenated margarines. Salad dressing or mayonnaise: homemade or commercial, made with a recommended oil. Low or nonfat salad dressing or mayonnaise.  Limit added fats and oils to 6 to 8 tsp per day (includes fats used in cooking, baking, salads, and spreads on bread). Remember to count the "hidden fats" in foods.  Avoid: Solid fats and shortenings: butter, lard, salt pork, bacon drippings. Gravy containing meat fat, shortening, or suet. Cocoa butter, coconut. Coconut oil, palm oil, palm kernel oil, or hydrogenated oils: these ingredients are often used in bakery products, nondairy creamers, whipped toppings, candy, and commercially fried foods. Read labels carefully. Salad dressings made of unknown oils, sour cream, or cheese, such as blue cheese and Roquefort. Cream, all kinds: half-and-half, light, heavy, or whipping. Sour cream or cream cheese (even if "light" or low-fat). Nondairy cream substitutes: coffee creamers and sour cream substitutes made with palm, palm kernel, hydrogenated oils, or coconut oil. Beverages  Allowed: Coffee (regular or decaffeinated), tea. Diet carbonated beverages, mineral water. Alcohol: Check with your caregiver. Moderation is recommended.  Avoid: Whole milk, regular sodas, and juice drinks with added sugar. Condiments  Allowed: All seasonings and condiments. Cocoa powder. "Cream" sauces made with recommended ingredients.  Avoid: Carob powder made with hydrogenated fats. SAMPLE MENU Breakfast   cup orange juice   cup oatmeal  1 slice toast  1 tsp margarine  1 cup skim milk Lunch  [place] sandwich with 2 oz [place], 2 slices bread  Lettuce and tomato slices  Fresh fruit  Carrot sticks  Coffee or tea Snack  Fresh fruit or low-fat crackers Dinner  3 oz lean ground beef  1 baked potato  1 tsp margarine   cup asparagus  Lettuce salad  1 tbs non-creamy dressing   cup peach slices  1 cup skim milk Document Released:  12/27/2007 Document Revised: 09/18/2011 Document Reviewed: 06/12/2011 Exit[employer] Patient Information 2014 [place], [place]. Radial Site Care Refer to this sheet in the next few weeks. These instructions provide you with information on caring for yourself after your procedure. Your caregiver may also give you more specific instructions. Your treatment has been planned according to current medical practices, but problems sometimes occur. Call your caregiver if you have any problems or questions after your procedure. HOME CARE INSTRUCTIONS  You may shower the day after the procedure.Remove the bandage (dressing) and gently wash the site with plain soap and water.Gently pat the site dry.  Do not apply powder or lotion to the site.  Do not submerge the affected site in water for 3 to 5 days.  Inspect the site at least twice daily.  Do not flex or bend the affected arm for 24 hours.  No lifting over 5 pounds (2.3 kg) for 5 days after your procedure.  Do not drive home if you are discharged the same day of the procedure. Have someone else drive you.  You may drive 24 hours after the procedure unless otherwise instructed by your caregiver.  Do not operate machinery or power tools for 24 hours.  A responsible adult should be with you for the first 24 hours after you arrive home. What to expect:  Any bruising will usually fade within 1 to 2 weeks.  Blood that collects in the tissue (hematoma) may be painful to the touch. It should usually decrease in size and tenderness within 1 to 2 weeks. SEEK IMMEDIATE MEDICAL CARE IF:  You have unusual pain at the radial site.  You have redness, warmth, swelling, or pain at the radial site.  You have drainage (other than a small amount of blood on the dressing).  You have chills.  You have a fever or persistent symptoms for more than 72 hours.  You have a fever and your symptoms suddenly get worse.  Your arm becomes pale, cool, tingly, or  numb.  You have heavy bleeding from the site. Hold pressure on the site. Document Released: 04/21/2010 Document Revised: 06/11/2011 Document Reviewed: 04/21/2010 [facility] Patient Information 2014 [place], [place].

## 2013-06-09 NOTE — Discharge Summary (Signed)
Physician Discharge Summary  Patient ID: [name] MRN: [id number] DOB/AGE: 09/01/1945 69 y.o.  Admit date: 06/05/2013 Discharge date: 06/09/2013  Primary Cardiologist: Dr. Lovena Le  Admission Diagnoses: Chest Pain  Discharge Diagnoses:  Principal Problem:   Chest pain Active Problems:   HYPERTENSION, BENIGN   Morbid obesity   Chronic diastolic heart failure   OSA (obstructive sleep apnea)   Borderline diabetes   Discharged Condition: stable  Hospital Course: This is a 68 y.o. male, followed by Dr. Lovena Le, with a past medical history significant for persistent atrial arrhythmias, diastolic heart failure, chronic anticoagulation, OSA on BiPAP and hypertension. He has previously undergone DC cardioversion, restoring sinus rhythm and is on chronic amiodarone. He has chronic bradycardia and has had documented heart rates in the 40s and low 50s at home. He is on Pradaxa for A/C.   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 No angina at the time. There was a tubular 40% to 50% stenosis in the mid LAD after the first diagonal and there was a 30% lesion in the distal RCA just prior to the takeoff of the PDA.   He was admitted to [facility] on 06/06/13 with pressure-like severe ("crushing") chest discomfort that occurred at rest, improved after both morphine and IV NTG were administered. He has chronic GERD, but his heart burn feels different and the current symptoms did not improve after ranitidine, as it would normally. In the ER, he continued to have mild residual discomfort on IV NTG. His ECG showed chronic lateral T wave inversions. Enzymes were normal x 3. He underwent a LHC, performed by Dr. Angelena Form, via the right radial artery. He was found to have mild non-obstructive CAD w/ normal LV function w/ an EF of 55-60%. Full angiographic details are listed below. Aggressive risk factor modification was recommended. He was continued on lisinopril for BP. 5 mg of Amlodipine was added  for better control. He was started on 40 mg of Lipitor for HLD, as his LDL was elevated at 122. He was also noted to have DM, as his Hgb A1C was 6.6. He was instructed to f/u with his PCP for further evaluation/ management. He also is obese and was encouraged to increase physical activity for weight loss.   It should also be noted that he endorsed constant fatigue.He has been on amiodarone for quite some time and is on a fairly high dose. Dr. Cathie Olden decided to lower his dose from 200 mg BID down to just 200 mg once daily, to see if he would have any improvement in his symptoms. He also recommended checking a TSH (blood was drawn prior to discharge/ result pending). We will f/u on his TSH when he returns to clinic for post- hospital f/u. He is scheduled to see Truitt Merle, NP, on 06/26/13.   It should also be noted that the patient is scheduled for an upcoming urologic procedure. It was determined that he would be low risk and he was cleared to undergo the procedure. He was instructed to hold his Pradaxa for 2 days prior to his surgery.    Consults: None  Significant Diagnostic Studies:   LHC 06/09/13 Hemodynamic Findings:  Central aortic pressure: 158/77  Left ventricular pressure: 157/17/31  Angiographic Findings:  Left main: No obstructive disease.  Left Anterior Descending Artery: Large caliber vessel that courses to the apex. The proximal vessel has a 30% stenosis. The mid vessel has diffuse 30% stenosis. The distal vessel has no obstructive disease. The diagonal branch is  large in caliber with mild ostial stenosis.  Circumflex Artery: Large caliber vessel with two large caliber obtuse marginal branches. The first obtuse marginal branch has 30% proximal stenosis. The second obtuse marginal branch has 30-40% proximal stenosis. The mid and distal AV groove Circumflex terminates into a posterolateral branch without any significant disease noted in this segment of the vessel.  Right Coronary  Artery: Large dominant vessel with no significant disease in the proximal, mid or distal vessel. The posterolateral branch has a 40% stenosis.  Left Ventricular Angiogram: LVEF=55-60%  Impression:  1. Mild non-obstructive CAD  2. Normal LV systolic function    Treatments: See Hospital Course  Discharge Exam: Blood pressure 140/77, pulse 67, temperature 97.9 F (36.6 C), temperature source Oral, resp. rate 18, height 5\' 10"  (1.778 m), weight 285 lb (129.275 kg), SpO2 92.00%.   Disposition: 01-Home or Self Care      Discharge Orders   Future Appointments Provider Department Dept Phone   06/10/2013 10:30 AM [facility] [place] [facility] [phone]   06/26/2013 10:30 AM Burtis Junes, NP [place] [facility] [phone]   06/29/2013 9:30 AM [facility] [place] [facility] [place] [phone]   07/07/2013 10:30 AM [name], MD [place] [facility] [place]   08/18/2013 9:45 AM Evans Lance, MD [facility] [phone]   Future Orders Complete By Expires   Diet - low sodium heart healthy  As directed    Increase activity slowly  As directed        Medication List    STOP taking these medications       aspirin EC 325 MG tablet      TAKE these medications       amiodarone 200 MG tablet  Commonly known as:  PACERONE  Take 1 tablet (200 mg total) by mouth daily.     amLODipine 5 MG tablet  Commonly known as:  NORVASC  Take 1 tablet (5 mg total) by mouth daily.     Aspirin-Caffeine 500-32 MG Tabs  Take 2 tablets by mouth every 6 (six) hours as needed (pain).     atorvastatin 40 MG tablet  Commonly known as:  LIPITOR  Take 1 tablet (40 mg total) by mouth daily at 6 PM.     cetirizine 10 MG tablet  Commonly known as:  ZYRTEC  Take 10 mg by mouth daily.     dabigatran 150 MG Caps capsule  Commonly known as:  PRADAXA  Take 150 mg by mouth 2 (two) times daily.      FLUoxetine 20 MG tablet  Commonly known as:  PROZAC  Take 20 mg by mouth every morning.     fluticasone 50 MCG/ACT nasal spray  Commonly known as:  FLONASE  Place 2 sprays into both nostrils daily.     furosemide 40 MG tablet  Commonly known as:  LASIX  Take 40 mg by mouth every morning.     ibuprofen 200 MG tablet  Commonly known as:  ADVIL,MOTRIN  Take 400-800 mg by mouth every 6 (six) hours as needed for mild pain or moderate pain.     lisinopril 5 MG tablet  Commonly known as:  PRINIVIL,ZESTRIL  Take 5 mg by mouth at bedtime.     mineral oil-hydrophilic petrolatum ointment  Apply 1 application topically daily as needed for dry skin (apply to hands and cover with glove).     omeprazole 20 MG capsule  Commonly known as:  PRILOSEC  Take 20 mg by mouth daily.     PRESERVISION AREDS 2 Caps  Take 1 capsule by mouth 2 (two) times daily.       Follow-up Information   Follow up with Truitt Merle, NP On 06/26/2013. (10:30 am )    Specialty:  Nurse Practitioner   Contact information:   [address] [place] [address] [phone]     TIME SPENT ON DISCHARGE, INCLUDING PHYSICIAN TIME: > 30 MINUTES  Signed: Lyda Jester 06/09/2013, 3:52 PM  Attending Note:   The patient was seen and examined.  Agree with assessment and plan as noted above.  Changes made to the above note as needed.  See my note from earlier today.  Most of his symptoms are non cardiac. I had a long discussion about the importance of weight loss for him   Ramond Dial., MD, [facility] 06/09/2013, 9:47 PM

## 2013-06-09 NOTE — Progress Notes (Signed)
Subjective: Chest pain resolved. No SOB. No right radial pain. He continue to endorse constant fatigue that has been ongoing for the last 2 years.   Objective: Vital signs in last 24 hours: Temp:  [97.7 F (36.5 C)-98.1 F (36.7 C)] 97.9 F (36.6 C) (03/09 2115) Pulse Rate:  [49-92] 67 (03/09 2200) Resp:  [15-34] 18 (03/09 2115) BP: (133-171)/(66-130) 152/86 mmHg (03/09 2200) SpO2:  [91 %-100 %] 92 % (03/09 2115) Weight:  [285 lb (129.275 kg)] 285 lb (129.275 kg) (03/09 1616) Last BM Date: 06/06/13  Intake/Output from previous day: 03/09 0701 - 03/10 0700 In: 1073 [I.V.:1073] Out: 1150 [Urine:1150] Intake/Output this shift:    Medications Current Facility-Administered Medications  Medication Dose Route Frequency Provider Last Rate Last Dose  . 0.9 %  sodium chloride infusion   Intravenous Continuous Etta Quill, DO 10 mL/hr at 06/07/13 0134 10 mL/hr at 06/07/13 0134  . acetaminophen (TYLENOL) tablet 650 mg  650 mg Oral Q4H PRN Etta Quill, DO      . amiodarone (PACERONE) tablet 200 mg  200 mg Oral BID Etta Quill, DO   200 mg at 06/08/13 2218  . amLODipine (NORVASC) tablet 5 mg  5 mg Oral Daily Mihai Croitoru, MD   5 mg at 06/08/13 1009  . atorvastatin (LIPITOR) tablet 40 mg  40 mg Oral q1800 Sueanne Margarita, MD      . Chlorhexidine Gluconate Cloth 2 % PADS 6 each  6 each Topical Q0600 Etta Quill, DO   6 each at 06/09/13 [phone]  . FLUoxetine (PROZAC) tablet 20 mg  20 mg Oral q morning - 10a Etta Quill, DO   20 mg at 06/08/13 1009  . fluticasone (FLONASE) 50 MCG/ACT nasal spray 2 spray  2 spray Each Nare Daily Etta Quill, DO   2 spray at 06/07/13 1610  . furosemide (LASIX) injection 40 mg  40 mg Intravenous Q12H Burnell Blanks, MD   40 mg at 06/09/13 0616  . heparin ADULT infusion 100 units/mL (25000 units/250 mL)  2,200 Units/hr Intravenous Continuous Mihai Croitoru, MD 22 mL/hr at 06/09/13 0104 2,200 Units/hr at 06/09/13 0104  . hydrALAZINE  (APRESOLINE) injection 10 mg  10 mg Intravenous Q6H PRN Annita Brod, MD      . HYDROmorphone (DILAUDID) injection 1 mg  1 mg Intravenous Q3H PRN Mihai Croitoru, MD      . ibuprofen (ADVIL,MOTRIN) tablet 400-800 mg  400-800 mg Oral Q6H PRN Etta Quill, DO   800 mg at 06/07/13 2140  . lisinopril (PRINIVIL,ZESTRIL) tablet 5 mg  5 mg Oral QHS Etta Quill, DO   5 mg at 06/08/13 2218  . mineral oil-hydrophilic petrolatum (AQUAPHOR) ointment 1 application  1 application Topical Daily PRN Etta Quill, DO      . morphine 4 MG/ML injection 4 mg  4 mg Intravenous Q4H PRN Etta Quill, DO   4 mg at 06/06/13 2312  . mupirocin ointment (BACTROBAN) 2 % 1 application  1 application Nasal BID Etta Quill, DO   1 application at 54/65/68 2259  . nitroGLYCERIN (NITROSTAT) SL tablet 0.4 mg  0.4 mg Sublingual Q5 min PRN Teressa Lower, MD      . ondansetron [facility]) injection 4 mg  4 mg Intravenous Q6H PRN Etta Quill, DO   4 mg at 06/07/13 0352  . pantoprazole (PROTONIX) EC tablet 40 mg  40 mg Oral Daily Etta Quill, DO  40 mg at 06/08/13 1009    PE: General appearance: alert, cooperative and no distress Lungs: clear to auscultation bilaterally Heart: regular rate and rhythm Extremities: no LEE Pulses: 2+ and symmetric Skin: warm and dry Neurologic: Grossly normal  Lab Results:   Recent Labs  06/07/13 0112 06/08/13 0415  WBC 9.7 7.1  HGB 11.9* 11.5*  HCT 34.9* 33.8*  PLT 221 183   BMET  Recent Labs  06/08/13 1045  NA 142  K 4.2  CL 106  CO2 24  GLUCOSE 141*  BUN 12  CREATININE 0.71  CALCIUM 8.5   Lipid Panel     Component Value Date/Time   CHOL 187 12/22/2012 1058   TRIG 155.0* 12/22/2012 1058   HDL 34.00* 12/22/2012 1058   CHOLHDL 6 12/22/2012 1058   VLDL 31.0 12/22/2012 1058   LDLCALC 122* 12/22/2012 1058    Cardiac Panel (last 3 results)  Recent Labs  06/06/13 0810 06/06/13 1430  TROPONINI <0.30 <0.30    Studies/Results:  Diagnostic LHC  06/08/13   Hemodynamic Findings:  Central aortic pressure: 158/77  Left ventricular pressure: 157/17/31  Angiographic Findings:  Left main: No obstructive disease.  Left Anterior Descending Artery: Large caliber vessel that courses to the apex. The proximal vessel has a 30% stenosis. The mid vessel has diffuse 30% stenosis. The distal vessel has no obstructive disease. The diagonal branch is large in caliber with mild ostial stenosis.  Circumflex Artery: Large caliber vessel with two large caliber obtuse marginal branches. The first obtuse marginal branch has 30% proximal stenosis. The second obtuse marginal branch has 30-40% proximal stenosis. The mid and distal AV groove Circumflex terminates into a posterolateral branch without any significant disease noted in this segment of the vessel.  Right Coronary Artery: Large dominant vessel with no significant disease in the proximal, mid or distal vessel. The posterolateral branch has a 40% stenosis.  Left Ventricular Angiogram: LVEF=55-60%  Impression:  1. Mild non-obstructive CAD  2. Normal LV systolic function   Assessment/Plan  Principal Problem:   Chest pain Active Problems:   HYPERTENSION, BENIGN   Morbid obesity   Chronic diastolic heart failure   OSA (obstructive sleep apnea)   Borderline diabetes  Plan: Day 1 s/p LHC revealing mild non-obstructive CAD w/ normal LV function w/ an EF of 55-60%. Full angiographic details listed above. Aggressive risk factor modification has been recommended. Resume Lisinopril for BP and Lipitor for HLD (LDL is elevated at 122, Goal should be at least <100).  It appears that he also has DM. Hgb A1c is 6.6. I instructed him to f/u with his PCP so that this can continued to be followed.  He is not on a beta blocker due to junctional rhythms and bradycardia. HR currently in the mid 50s - low 60s. He is also on amiodarone for PAF.  No ASA due to Pradaxa. Right radial access site is stable. He continues to feel  tired all the time. ? If this may be due to frequent bradycardia or DM. He may need adjustments to he amiodarone as an outpatient. Otherwise, I feel he is stable for discharge today. MD to follow.     LOS: 4 days    Brittainy M. Ladoris Gene 06/09/2013 7:52 AM  Attending Note:   The patient was seen and examined.  Agree with assessment and plan as noted above.  Changes made to the above note as needed.  Patient and wife seem frustrated at lack of a precise cause for his  multiple symptoms ( fatigue, HTN, sleep apnea, chest pain)  1. CAD:  Has mild coronary artery disease by cardiac catheterization. There is no evidence that any of his chest pains are related to coronary artery disease  2. obesity: The patient has obesity and also has sleep apnea. I mentioned that he should probably lose weight. His wife commented very quickly that he was unable to exercise because of his generalized fatigue.  I think that he probably needs to do better with exercise. May to consult nutrition as OP.  3. HTN: He's had some problems with bradycardia in the past. Metoprolol has been tried but this caused an too much fatigue and also caused sinus bradycardia. Dr. Loletha Grayer. has placed him on Norvasc which he seems to tolerate. Discharge him on the current dose of amlodipine.  4. Atrial fibrillation:  The patient has been on amiodarone for quite some time and is on fairly high dose.  He has multiple complaints ( not specifically related to amio or a-fib)  Will check a TSH.  We may try to lower his dose and see how he does. Continue pradaxa.  5. Diabetes Mellitus:  Per his medical doctor  6. Obstructive sleep apnea:  Had a recent sleep study. It sounds like he needs a new CPAP mask.  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 On amio., will decrease dose to 200 a day.  He may even need less than that.  At this point his cardiac status seems to be stable. He should be at low risk for his upcoming urology procedure. He  should hold his Pradaxa for  2 days prior to his surgery.   Thayer Headings, Brooke Bonito., MD, [facility] 06/09/2013, 10:15 AM

## 2013-06-09 NOTE — Progress Notes (Signed)
ANTICOAGULATION CONSULT NOTE - Follow Up Consult  Pharmacy Consult for Heparin Indication: chest pain/ACS  Allergies  Allergen Reactions  . 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 Zithromax [Azithromycin Dihydrate] Other (See Comments)    Swelling (arms/legs/scotrum)    Patient Measurements: Height: 5\' 10"  (177.8 cm) Weight: 285 lb (129.275 kg) IBW/kg (Calculated) : 73 Heparin Dosing Weight: ~103kg  Vital Signs: Temp: 97.9 F (36.6 C) (03/09 2115) BP: 152/86 mmHg (03/09 2200) Pulse Rate: 67 (03/09 2200)  Labs:  Recent Labs  06/06/13 1430  06/07/13 0112 06/07/13 1007 06/08/13 0415 06/08/13 1045 06/09/13 0805  HGB  --   < > 11.9*  --  11.5*  --  12.9*  HCT  --   --  34.9*  --  33.8*  --  37.7*  PLT  --   --  221  --  183  --  223  HEPARINUNFRC  --   < > 0.26* 0.29* 0.45  --  0.34  CREATININE  --   --   --   --   --  0.71  --   TROPONINI <0.30  --   --   --   --   --   --   < > = values in this interval not displayed.  Estimated Creatinine Clearance: 122.7 ml/min (by C-G formula based on Cr of 0.71).   Medications:  Heparin @ 2200 units/hr  Assessment: 67yom continues on heparin at 2200 units/hr which was resumed 6 hours after TR band removal post-cath yesterday. Heparin level is therapeutic this morning at 0.34units/mL. CBC stable. No bleeding reported. Patient to restart Pradaxa 150mg  BID today as radial site is stable.  Goal of Therapy:  Heparin level 0.3-0.7 units/ml Monitor platelets by anticoagulation protocol: Yes   Plan:  1. Continue heparin at 2200 units/hr until 1000 when first Pradaxa dose is given 2. Follow for s/s bleeding on Pradaxa while hospitalized  Jamont Mellin D. Phelicia Dantes, PharmD, BCPS Clinical Pharmacist Pager: [phone] 06/09/2013 8:45 AM

## 2013-06-09 NOTE — Telephone Encounter (Signed)
New message    Can patient proceed with surgery - kidney stone on 3/16 .    Advise stop praxada x 2 days before.

## 2013-06-09 NOTE — Telephone Encounter (Signed)
Discussed with Dr Lovena Le, may proceed with surgery and hold Pradaxa for 2 days prior.  Resume after the 24 hours after procedure

## 2013-06-10 ENCOUNTER — Other Ambulatory Visit: Payer: Self-pay | Admitting: Urology

## 2013-06-10 ENCOUNTER — Encounter (HOSPITAL_COMMUNITY)
Admission: RE | Admit: 2013-06-10 | Discharge: 2013-06-10 | Disposition: A | Discharge status: Home / Self Care | Payer: Medicare Other | Source: Ambulatory Visit | Attending: Urology | Admitting: Urology

## 2013-06-10 ENCOUNTER — Encounter (HOSPITAL_COMMUNITY): Payer: Self-pay

## 2013-06-10 DIAGNOSIS — Z01812 Encounter for preprocedural laboratory examination: Secondary | ICD-10-CM | POA: Insufficient documentation

## 2013-06-10 HISTORY — DX: Unspecified osteoarthritis, unspecified site: M19.90

## 2013-06-10 HISTORY — DX: Nausea with vomiting, unspecified: Z98.890

## 2013-06-10 HISTORY — DX: Psoriasis, unspecified: L40.9

## 2013-06-10 HISTORY — DX: Nausea with vomiting, unspecified: R11.2

## 2013-06-10 LAB — APTT: aPTT: 44 seconds — ABNORMAL HIGH (ref 24–37)

## 2013-06-10 LAB — PROTIME-INR
INR: 1.2 (ref 0.00–1.49)
Prothrombin Time: 14.9 seconds (ref 11.6–15.2)

## 2013-06-10 NOTE — Pre-Procedure Instructions (Addendum)
PT HOSPITALIZED AT [facility] 3/615 TO 06/09/13 FOR CHEST PAIN - HAS CBC REPORT IN EPIC DONE 06/09/13; BMET REPORT IN EPIC FROM 06/08/13; CXR REPORT IN EPIC  FROM 06/06/13; AND EKG REPORT IN EPIC FROM 06/07/13.  HE HAD CARDIAC CATH 06/08/13 - REPORT IN EPIC.  HE WAS ON HEPARIN WHILE HOSPITALIZED - PT, PTT WAS DONE TODAY - PREOP AT [facility]. DISCHARGE SUMMARY [facility] 06/09/13  STATES PT CLEARED TO UNDERGO UROLOGY SURGERY AND CAN HOLD PRADAXA FOR 2 DAYS PREOP.

## 2013-06-10 NOTE — Patient Instructions (Signed)
   YOUR SURGERY IS SCHEDULED AT [facility]  ON:  Monday  3/16  REPORT TO  SHORT STAY CENTER AT:  7:00 AM      PHONE # FOR SHORT STAY IS [phone]  DO NOT EAT OR DRINK ANYTHING AFTER MIDNIGHT THE NIGHT BEFORE YOUR SURGERY.  YOU MAY BRUSH YOUR TEETH, RINSE OUT YOUR MOUTH--BUT NO WATER, NO FOOD, NO CHEWING GUM, NO MINTS, NO CANDIES, NO CHEWING TOBACCO.  PLEASE TAKE THE FOLLOWING MEDICATIONS THE AM OF YOUR SURGERY WITH A FEW SIPS OF WATER:   AMIODARONE, AMLODIPINE, FLUXOXETINE, OMEPRAZOLE.  MAY USE FLONASE   IF YOU HAVE SLEEP APNEA AND USE CPAP OR BIPAP--PLEASE BRING THE MASK AND THE TUBING.  DO NOT BRING YOUR MACHINE.  DO NOT BRING VALUABLES, MONEY, CREDIT CARDS.  DO NOT WEAR JEWELRY, MAKE-UP, NAIL POLISH AND NO METAL PINS OR CLIPS IN YOUR HAIR. CONTACT LENS, DENTURES / PARTIALS, GLASSES SHOULD NOT BE WORN TO SURGERY AND IN MOST CASES-HEARING AIDS WILL NEED TO BE REMOVED.  BRING YOUR GLASSES CASE, ANY EQUIPMENT NEEDED FOR YOUR CONTACT LENS. FOR PATIENTS ADMITTED TO THE HOSPITAL--CHECK OUT TIME THE DAY OF DISCHARGE IS 11:00 AM.  ALL INPATIENT ROOMS ARE PRIVATE - WITH BATHROOM, TELEPHONE, TELEVISION AND WIFI INTERNET.  IF YOU ARE BEING DISCHARGED THE SAME DAY OF YOUR SURGERY--YOU CAN NOT DRIVE YOURSELF HOME--AND SHOULD NOT GO HOME ALONE BY TAXI OR BUS.  NO DRIVING OR OPERATING MACHINERY, OR MAKING LEGAL DECISIONS FOR 24 HOURS FOLLOWING ANESTHESIA / PAIN MEDICATIONS.  PLEASE MAKE ARRANGEMENTS FOR SOMEONE TO BE WITH YOU AT HOME THE FIRST 24 HOURS AFTER SURGERY. RESPONSIBLE DRIVER'S NAME / PHONE   PT'S WIFE  [name]  CELL [phone]                                                    FAILURE TO FOLLOW THESE INSTRUCTIONS MAY RESULT IN THE CANCELLATION OF YOUR SURGERY. PLEASE BE AWARE THAT YOU MAY NEED ADDITIONAL BLOOD DRAWN DAY OF YOUR SURGERY  PATIENT SIGNATURE_________________________________

## 2013-06-12 NOTE — H&P (Signed)
History of Present Illness         [name] returns today for follow-up of intermittent gross hematuria. He was last seen in November 2014 at which point he had ongoing intermittent painless gross hematuria. It has been total at times and also occasionally initial or terminal in nature. Cystoscopy along with CT imaging and urine cytology have failed to show any worrisome pathology. He does have bilateral nonobstructing lower pole stones ( 4-38mm). His hematuria may be on the basis of that in combination with his Pradaxa. It is also possible it may be related to BPH with friable vessel that have been intermittently bleeding. I do not feel that aggressive treatment for his bilateral stones are indicated at this point.    We thought there was a chance that he would be stopping his anticoagulation if he were to undergo an ablative procedure, but apparently is not the case. It does appear that he is going to require long-term anticoagulation. [name] has continued to have gross hematuria, at least intermittently. On average he has been having it 5 out 7 days each week. He did stop his Pradaxa for about 10 days due to some cost concerns and running out of the medication. During that time frame, his gross hematuria resolved. He has restarted the anticoagulation and last weekend he had the heaviest bleeding he had had in a while. His urine is grossly bloody today. We did talk about repeating a cystoscopy when he was actively bleeding, to see if we could better lateralize his hematuria and also get a more definitive answer as to the etiology and potential treatment options for him. Otherwise, things have been clinically unchanged.      Past Medical History Problems  1. History of Anxiety (300.00) 2. History of Arthritis (V13.4) 3. History of Atrial fibrillation (427.31) 4. History of Disorder of heart rhythm (427.9) 5. History of Gout (274.9) 6. History of cardiac disorder (V12.50) 7. History of depression  (V11.8) 8. History of heartburn (V12.79) 9. History of sleep apnea (V13.89)  Surgical History Problems  1. History of Eye Surgery 2. History of Skull Excision  Current Meds 1. Ibuprofen 200 MG Oral Tablet;  Therapy: (Recorded:14Oct2014) to Recorded 2. Lasix TABS;  Therapy: (Recorded:14Oct2014) to Recorded 3. Pacerone 300 MG TABS;  Therapy: (Recorded:14Oct2014) to Recorded 4. Pradaxa 150 MG Oral Capsule;  Therapy: (Recorded:14Oct2014) to Recorded 5. PreserVision AREDS CAPS;  Therapy: (Recorded:14Oct2014) to Recorded 6. PROzac 20 MG Oral Capsule;  Therapy: (Recorded:14Oct2014) to Recorded 7. ZyrTEC Allergy 10 MG Oral Tablet;  Therapy: (Recorded:14Oct2014) to Recorded  Allergies Medication  1. Zithromax TABS  Family History Problems  1. Family history of Death In The Family Father : Father   74yrs, Heart dz 2. Family history of Death In The Family Mother : Mother   24MPN, sx complications 3. Family history of Family Health Status Number Of Children   1 daughter 4. Family history of Heart Disease (V17.49) : Father 5. Family history of Heart Disease (V17.49) : Brother  Social History Problems  1. Alcohol Use   less than 1 qd 2. Caffeine Use   1-2 qd 3. Former smoker (V15.82)   1 ppd for 77yrs, nonsmoker for the past 73yrs 4. Marital History - Currently Married 5. Occupation:   retired  Review of Systems Genitourinary, constitutional, skin, eye, otolaryngeal, hematologic/lymphatic, cardiovascular, pulmonary, endocrine, musculoskeletal, gastrointestinal, neurological and psychiatric system(s) were reviewed and pertinent findings if present are noted.  Genitourinary: urinary frequency and hematuria.  Constitutional: feeling tired (fatigue).  Respiratory: cough and shortness of breath during exertion.  Musculoskeletal: back pain and joint pain.    Vitals Vital Signs [Data Includes: Last 1 Day]  Recorded: 24Feb2015 02:52PM  Blood Pressure: 116 /  60 Temperature: 97 F Heart Rate: 65  Physical Exam Constitutional: Well nourished and well developed . No acute distress.  Pulmonary: No respiratory distress and normal respiratory rhythm and effort.  Cardiovascular: Heart rate and rhythm are normal . No peripheral edema.  Abdomen: The abdomen is soft and nontender. No masses are palpated. No CVA tenderness. No hernias are palpable. No hepatosplenomegaly noted.  Skin: Normal skin turgor, no visible rash and no visible skin lesions.  Neuro/Psych:. Mood and affect are appropriate.    Results/Data Urine [Data Includes: Last 1 Day]   [id number]  COLOR RED   APPEARANCE CLOUDY   SPECIFIC GRAVITY 1.025   pH 6.5   GLUCOSE NEG mg/dL  BILIRUBIN NEG   KETONE TRACE mg/dL  BLOOD LARGE   PROTEIN 100 mg/dL  UROBILINOGEN 1 mg/dL  NITRITE POS   LEUKOCYTE ESTERASE TRACE   SQUAMOUS EPITHELIAL/HPF NONE SEEN   WBC NONE SEEN WBC/hpf  RBC TNTC RBC/hpf  BACTERIA FEW   CRYSTALS NONE SEEN   CASTS NONE SEEN    Procedure  Procedure: Cystoscopy  Chaperone Present: brandy.  Indication: Hematuria.  Informed Consent: Risks, benefits, and potential adverse events were discussed and informed consent was obtained from the patient . Specific risks including, but not limited to bleeding, infection, pain, allergic reaction etc. were explained.  Prep: The patient was prepped with betadine.  Anesthesia:. Local anesthesia was administered intraurethrally with 2% lidocaine jelly.  Antibiotic prophylaxis: Ciprofloxacin.  Procedure Note:  Urethral meatus:. No abnormalities.  Anterior urethra: No abnormalities.  Prostatic urethra:. The lateral and median prostatic lobes were enlarged.  Bladder: Visulization was obscured due to blood. The right ureteral orifice had bloody efflux. A systematic survey of the bladder demonstrated no bladder tumors or stones. The patient tolerated the procedure well.  Complications: None.    Assessment Assessed  1. Gross hematuria  (599.71) 2. Benign prostatic hypertrophy without lower urinary tract symptoms (600.00) 3. Nephrolithiasis (592.0)  Plan Gross hematuria  1. Cysto; Status:Hold For - Appointment,Date of Service; Requested for:24Feb2015;  2. HEMOGLOBIN & HEMATOCRIT; Status:Hold For - Specimen/Data Collection,Appointment;  Requested for:24Feb2015;  3. NMP22; Status:Hold For - Specimen/Data Collection,Appointment; Requested  for:24Feb2015;  Health Maintenance  4. UA With REFLEX; [Do Not Release]; Status:Complete;   [id number] 02:41PM Nephrolithiasis  5. Follow-up Schedule Surgery Office  Follow-up  Status: Hold For - Appointment   Requested for: [id number]  Discussion/Summary         [name] has continued to have intermittent gross hematuria now for several months. His urine cytology was negative, as was his hematuria protocol CT, with the exception of bilateral renal calculi. When we performed cystoscopy initially several months ago, he was not actively bleeding at that time and therefore we could not localize his hematuria. On today's repeat cystoscopy, he clearly had bloody urine coming from his right ureteral orifice. This suggest that the right-sided renal stones are likely to be the cause of his ongoing gross hematuria coupled with the anticoagulation. It appears he does need to stay on anticoagulation and therefore, given the chronicity of his hematuria, it does appear that we need to go ahead in an attempt to try to treat the right-sided renal calculi. Given the entirety of the situation, I think this will be best handled with an attempt at flexible  ureteroscopy with Holmium laser lithotripsy. I think ESWL will be difficult due to the difficult visualization of his stones on scout imaging. I would anticipate potentially about an hour of operative time. We would ask him to hold his Pradaxa for 2 days, which he has done in the past for procedures and again, was off it for 10 days recently. We would plan  on leaving a double-J stent in his kidney for several days. Given the location of the stones, treatment may be difficult but we will attempt to render him stone free. We will also use this opportunity to rule out other pathology such as transitional cell carcinoma of the ureter or collecting system on that right side, although again, his CT did not show anything of concern. I will also check a hemoglobin and hematocrit level on him today.  cc: [name], MD  Tresa Endo, MD   Signatures Electronically signed by : Rana Snare, M.D.; May 27 2013  9:02AM EST

## 2013-06-14 MED ORDER — DEXTROSE 5 % IV SOLN
3.0000 g | INTRAVENOUS | Status: AC
  Administered 2013-06-15: 3 g via INTRAVENOUS
  Filled 2013-06-14: qty 3000

## 2013-06-15 ENCOUNTER — Encounter (HOSPITAL_COMMUNITY)
Admission: RE | Disposition: A | Discharge status: Home / Self Care | Payer: Self-pay | Source: Ambulatory Visit | Attending: Urology

## 2013-06-15 ENCOUNTER — Encounter (HOSPITAL_COMMUNITY): Payer: Self-pay | Admitting: *Deleted

## 2013-06-15 ENCOUNTER — Ambulatory Visit (HOSPITAL_COMMUNITY)
Admission: RE | Admit: 2013-06-15 | Discharge: 2013-06-15 | Disposition: A | Discharge status: Home / Self Care | Payer: Medicare Other | Source: Ambulatory Visit | Attending: Urology | Admitting: Urology

## 2013-06-15 ENCOUNTER — Encounter (HOSPITAL_COMMUNITY): Payer: Medicare Other | Admitting: Anesthesiology

## 2013-06-15 ENCOUNTER — Ambulatory Visit (HOSPITAL_COMMUNITY): Payer: Medicare Other | Admitting: Anesthesiology

## 2013-06-15 DIAGNOSIS — I2510 Atherosclerotic heart disease of native coronary artery without angina pectoris: Secondary | ICD-10-CM | POA: Insufficient documentation

## 2013-06-15 DIAGNOSIS — I10 Essential (primary) hypertension: Secondary | ICD-10-CM | POA: Insufficient documentation

## 2013-06-15 DIAGNOSIS — I4891 Unspecified atrial fibrillation: Secondary | ICD-10-CM | POA: Insufficient documentation

## 2013-06-15 DIAGNOSIS — G4730 Sleep apnea, unspecified: Secondary | ICD-10-CM | POA: Insufficient documentation

## 2013-06-15 DIAGNOSIS — F411 Generalized anxiety disorder: Secondary | ICD-10-CM | POA: Insufficient documentation

## 2013-06-15 DIAGNOSIS — N200 Calculus of kidney: Secondary | ICD-10-CM

## 2013-06-15 DIAGNOSIS — Z87891 Personal history of nicotine dependence: Secondary | ICD-10-CM | POA: Insufficient documentation

## 2013-06-15 DIAGNOSIS — R310 Gross hematuria: Secondary | ICD-10-CM | POA: Insufficient documentation

## 2013-06-15 DIAGNOSIS — Z79899 Other long term (current) drug therapy: Secondary | ICD-10-CM | POA: Insufficient documentation

## 2013-06-15 DIAGNOSIS — E119 Type 2 diabetes mellitus without complications: Secondary | ICD-10-CM | POA: Insufficient documentation

## 2013-06-15 DIAGNOSIS — F3289 Other specified depressive episodes: Secondary | ICD-10-CM | POA: Insufficient documentation

## 2013-06-15 DIAGNOSIS — M109 Gout, unspecified: Secondary | ICD-10-CM | POA: Insufficient documentation

## 2013-06-15 DIAGNOSIS — N400 Enlarged prostate without lower urinary tract symptoms: Secondary | ICD-10-CM | POA: Insufficient documentation

## 2013-06-15 DIAGNOSIS — Z7901 Long term (current) use of anticoagulants: Secondary | ICD-10-CM | POA: Insufficient documentation

## 2013-06-15 DIAGNOSIS — K219 Gastro-esophageal reflux disease without esophagitis: Secondary | ICD-10-CM | POA: Insufficient documentation

## 2013-06-15 DIAGNOSIS — F329 Major depressive disorder, single episode, unspecified: Secondary | ICD-10-CM | POA: Insufficient documentation

## 2013-06-15 DIAGNOSIS — D649 Anemia, unspecified: Secondary | ICD-10-CM | POA: Insufficient documentation

## 2013-06-15 HISTORY — PX: HOLMIUM LASER APPLICATION: SHX5852

## 2013-06-15 HISTORY — PX: CYSTOSCOPY WITH RETROGRADE PYELOGRAM, URETEROSCOPY AND STENT PLACEMENT: SHX5789

## 2013-06-15 LAB — GLUCOSE, CAPILLARY
Glucose-Capillary: 200 mg/dL — ABNORMAL HIGH (ref 70–99)
Glucose-Capillary: 134 mg/dL — ABNORMAL HIGH (ref 70–99)

## 2013-06-15 SURGERY — CYSTOURETEROSCOPY, WITH RETROGRADE PYELOGRAM AND STENT INSERTION
Anesthesia: General | Site: Ureter | Laterality: Right

## 2013-06-15 MED ORDER — ONDANSETRON HCL 4 MG/2ML IJ SOLN
INTRAMUSCULAR | Status: DC | PRN
  Administered 2013-06-15: 4 mg via INTRAVENOUS

## 2013-06-15 MED ORDER — PHENYLEPHRINE 40 MCG/ML (10ML) SYRINGE FOR IV PUSH (FOR BLOOD PRESSURE SUPPORT)
PREFILLED_SYRINGE | INTRAVENOUS | Status: AC
  Filled 2013-06-15: qty 10

## 2013-06-15 MED ORDER — PROPOFOL 10 MG/ML IV BOLUS
INTRAVENOUS | Status: AC
  Filled 2013-06-15: qty 20

## 2013-06-15 MED ORDER — SUCCINYLCHOLINE CHLORIDE 20 MG/ML IJ SOLN
INTRAMUSCULAR | Status: DC | PRN
  Administered 2013-06-15: 100 mg via INTRAVENOUS

## 2013-06-15 MED ORDER — PHENYLEPHRINE HCL 10 MG/ML IJ SOLN
INTRAMUSCULAR | Status: DC | PRN
  Administered 2013-06-15: 40 ug via INTRAVENOUS
  Administered 2013-06-15: 80 ug via INTRAVENOUS
  Administered 2013-06-15: 40 ug via INTRAVENOUS
  Administered 2013-06-15: 80 ug via INTRAVENOUS
  Administered 2013-06-15: 40 ug via INTRAVENOUS

## 2013-06-15 MED ORDER — BELLADONNA ALKALOIDS-OPIUM 16.2-60 MG RE SUPP
RECTAL | Status: DC | PRN
  Administered 2013-06-15: 1 via RECTAL

## 2013-06-15 MED ORDER — HYDROCODONE-ACETAMINOPHEN 5-325 MG PO TABS
1.0000 | ORAL_TABLET | Freq: Four times a day (QID) | ORAL | Status: DC | PRN
  Administered 2013-06-15: 2 via ORAL
  Filled 2013-06-15: qty 2

## 2013-06-15 MED ORDER — EPHEDRINE SULFATE 50 MG/ML IJ SOLN
INTRAMUSCULAR | Status: DC | PRN
  Administered 2013-06-15 (×3): 5 mg via INTRAVENOUS

## 2013-06-15 MED ORDER — KETOROLAC TROMETHAMINE 30 MG/ML IJ SOLN
30.0000 mg | Freq: Once | INTRAMUSCULAR | Status: AC
  Administered 2013-06-15: 30 mg via INTRAVENOUS
  Filled 2013-06-15: qty 1

## 2013-06-15 MED ORDER — ONDANSETRON HCL 4 MG/2ML IJ SOLN
INTRAMUSCULAR | Status: AC
  Filled 2013-06-15: qty 2

## 2013-06-15 MED ORDER — GLYCOPYRROLATE 0.2 MG/ML IJ SOLN
INTRAMUSCULAR | Status: AC
  Filled 2013-06-15: qty 3

## 2013-06-15 MED ORDER — NEOSTIGMINE METHYLSULFATE 1 MG/ML IJ SOLN
INTRAMUSCULAR | Status: AC
  Filled 2013-06-15: qty 10

## 2013-06-15 MED ORDER — 0.9 % SODIUM CHLORIDE (POUR BTL) OPTIME
TOPICAL | Status: DC | PRN
  Administered 2013-06-15: 1000 mL

## 2013-06-15 MED ORDER — LIDOCAINE HCL 2 % EX GEL
CUTANEOUS | Status: DC | PRN
  Administered 2013-06-15: 1 via URETHRAL

## 2013-06-15 MED ORDER — CISATRACURIUM BESYLATE 20 MG/10ML IV SOLN
INTRAVENOUS | Status: AC
  Filled 2013-06-15: qty 10

## 2013-06-15 MED ORDER — TAMSULOSIN HCL 0.4 MG PO CAPS: 0.4000 mg | ORAL_CAPSULE | Freq: Every day | ORAL | Status: DC

## 2013-06-15 MED ORDER — PROPOFOL 10 MG/ML IV BOLUS
INTRAVENOUS | Status: DC | PRN
  Administered 2013-06-15: 150 mg via INTRAVENOUS

## 2013-06-15 MED ORDER — FUROSEMIDE 10 MG/ML IJ SOLN
10.0000 mg | Freq: Once | INTRAMUSCULAR | Status: AC
  Administered 2013-06-15: 10 mg via INTRAVENOUS

## 2013-06-15 MED ORDER — SODIUM CHLORIDE 0.9 % IR SOLN
Status: DC | PRN
  Administered 2013-06-15: 4000 mL

## 2013-06-15 MED ORDER — NEOSTIGMINE METHYLSULFATE 1 MG/ML IJ SOLN
INTRAMUSCULAR | Status: DC | PRN
  Administered 2013-06-15: 4 mg via INTRAVENOUS

## 2013-06-15 MED ORDER — GLYCOPYRROLATE 0.2 MG/ML IJ SOLN
INTRAMUSCULAR | Status: DC | PRN
  Administered 2013-06-15: 0.6 mg via INTRAVENOUS

## 2013-06-15 MED ORDER — FENTANYL CITRATE 0.05 MG/ML IJ SOLN
INTRAMUSCULAR | Status: AC
  Filled 2013-06-15: qty 5

## 2013-06-15 MED ORDER — HYDROCODONE-ACETAMINOPHEN 5-325 MG PO TABS: 1.0000 | ORAL_TABLET | Freq: Four times a day (QID) | ORAL | Status: DC | PRN

## 2013-06-15 MED ORDER — LACTATED RINGERS IV SOLN
INTRAVENOUS | Status: DC | PRN
  Administered 2013-06-15: 09:00:00 via INTRAVENOUS

## 2013-06-15 MED ORDER — BELLADONNA ALKALOIDS-OPIUM 16.2-60 MG RE SUPP
RECTAL | Status: AC
  Filled 2013-06-15: qty 1

## 2013-06-15 MED ORDER — FENTANYL CITRATE 0.05 MG/ML IJ SOLN
INTRAMUSCULAR | Status: DC | PRN
  Administered 2013-06-15: 50 ug via INTRAVENOUS

## 2013-06-15 MED ORDER — FUROSEMIDE 10 MG/ML IJ SOLN
INTRAMUSCULAR | Status: AC
  Filled 2013-06-15: qty 2

## 2013-06-15 MED ORDER — CISATRACURIUM BESYLATE (PF) 10 MG/5ML IV SOLN
INTRAVENOUS | Status: DC | PRN
  Administered 2013-06-15: 4 mg via INTRAVENOUS

## 2013-06-15 MED ORDER — LIDOCAINE HCL 2 % EX GEL
CUTANEOUS | Status: AC
  Filled 2013-06-15: qty 10

## 2013-06-15 SURGICAL SUPPLY — 19 items
BAG URINE DRAINAGE (UROLOGICAL SUPPLIES) ×2 IMPLANT
BAG URO CATCHER STRL LF (DRAPE) ×2 IMPLANT
BASKET ZERO TIP NITINOL 2.4FR (BASKET) ×2 IMPLANT
CATH INTERMIT  6FR 70CM (CATHETERS) ×2 IMPLANT
CLOTH BEACON ORANGE TIMEOUT ST (SAFETY) ×2 IMPLANT
DRAPE CAMERA CLOSED 9X96 (DRAPES) ×2 IMPLANT
FIBER LASER FLEXIVA 200 (UROLOGICAL SUPPLIES) ×4 IMPLANT
FIBER LASER FLEXIVA 365 (UROLOGICAL SUPPLIES) IMPLANT
GLOVE BIOGEL M STRL SZ7.5 (GLOVE) ×2 IMPLANT
GOWN STRL REUS W/TWL XL LVL3 (GOWN DISPOSABLE) ×4 IMPLANT
GUIDEWIRE .038 (WIRE) IMPLANT
GUIDEWIRE ANG ZIPWIRE 038X150 (WIRE) IMPLANT
GUIDEWIRE STR DUAL SENSOR (WIRE) ×2 IMPLANT
IV NS IRRIG 3000ML ARTHROMATIC (IV SOLUTION) IMPLANT
MANIFOLD NEPTUNE II (INSTRUMENTS) ×2 IMPLANT
PACK CYSTO (CUSTOM PROCEDURE TRAY) ×2 IMPLANT
SHEATH ACCESS URETERAL 54CM (SHEATH) ×2 IMPLANT
STENT CONTOUR 6FRX24X.038 (STENTS) ×2 IMPLANT
TUBING CONNECTING 10 (TUBING) ×2 IMPLANT

## 2013-06-15 NOTE — Discharge Instructions (Signed)
DISCHARGE INSTRUCTIONS FOR KIDNEY STONES OR URETERAL STENT  MEDICATIONS:   1. DO NOT RESUME YOUR ASPIRIN, or any other medicines like ibuprofen, motrin, excedrin, advil, aleve, vitamin E, fish oil as these can all cause bleeding x 7 days.  2. Resume all your other meds from home - except do not take any other pain meds that you may have at home.  ACTIVITY 1. No strenuous activity x 1week 2. No driving while on narcotic pain medications 3. Drink plenty of water 4. Continue to walk at home - you can still get blood clots when you are at home, so keep active, but don't over do it. 5. May return to work in 3 days.  BATHING 1. You can shower and we recommend daily showers  2. If you have a string coming from your urethra:  The stent string is attached to your ureteral stent.  Do not pull on this.   SIGNS/SYMPTOMS TO CALL: 1. Please call us if you have a fever greater than 101.5, uncontrolled  nausea/vomiting, uncontrolled pain, dizziness, unable to urinate, bloody urine, chest pain, shortness of breath, leg swelling, leg pain, redness around wound, drainage from wound, or any other concerns or questions.  You can reach [place] at [phone].  FOLLOW-UP 1. We may change your appointment for followup date next week for removal of your stent 2. I would like to leave the stent in longer than the scheduled for removal later this week 3. He may resume the aspirin and pradaxa tomorrow 4.  5.

## 2013-06-15 NOTE — Anesthesia Postprocedure Evaluation (Signed)
  Anesthesia Post-op Note  Patient: [name]  Procedure(s) Performed: Procedure(s) (LRB): CYSTOSCOPY WITH RETROGRADE PYELOGRAM, URETEROSCOPY AND STENT PLACEMENT (Right) HOLMIUM LASER APPLICATION (Right)  Patient Location: PACU  Anesthesia Type: General  Level of Consciousness: awake and alert   Airway and Oxygen Therapy: Patient Spontanous Breathing  Post-op Pain: mild  Post-op Assessment: Post-op Vital signs reviewed, Patient's Cardiovascular Status Stable, Respiratory Function Stable, Patent Airway and No signs of Nausea or vomiting  Last Vitals:  Filed Vitals:   06/15/13 1147  BP: 153/64  Pulse: 57  Temp: 36.8 C  Resp: 20    Post-op Vital Signs: stable   Complications: No apparent anesthesia complications

## 2013-06-15 NOTE — Transfer of Care (Signed)
Immediate Anesthesia Transfer of Care Note  Patient: [name]  Procedure(s) Performed: Procedure(s): CYSTOSCOPY WITH RETROGRADE PYELOGRAM, URETEROSCOPY AND STENT PLACEMENT (Right) HOLMIUM LASER APPLICATION (Right)  Patient Location: PACU  Anesthesia Type:General  Level of Consciousness: awake and alert   Airway & Oxygen Therapy: Patient Spontanous Breathing and Patient connected to face mask oxygen  Post-op Assessment: Report given to PACU RN and Post -op Vital signs reviewed and stable  Post vital signs: Reviewed and stable  Complications: No apparent anesthesia complications

## 2013-06-15 NOTE — Preoperative (Signed)
Beta Blockers   Reason not to administer Beta Blockers:Not Applicable 

## 2013-06-15 NOTE — Anesthesia Preprocedure Evaluation (Addendum)
Anesthesia Evaluation  Patient identified by MRN, date of birth, ID band Patient awake    Reviewed: Allergy & Precautions, H&P , NPO status , Patient's Chart, lab work & pertinent test results  History of Anesthesia Complications (+) PONV and history of anesthetic complications  Airway Mallampati: II TM Distance: >3 FB Neck ROM: Full    Dental no notable dental hx.    Pulmonary sleep apnea , former smoker,  breath sounds clear to auscultation  Pulmonary exam normal       Cardiovascular hypertension, Pt. on medications + CAD and +CHF negative cardio ROS  + dysrhythmias Atrial Fibrillation Rhythm:Regular Rate:Normal  Admitted last week with chest pain and SOB. Unknown etiology, possibly amiodarone side effect. Left heart catheterization was OK. No stent.   Neuro/Psych PSYCHIATRIC DISORDERS  Neuromuscular disease    GI/Hepatic Neg liver ROS, GERD-  Medicated,  Endo/Other  negative endocrine ROSdiabetes  Renal/GU negative Renal ROS  negative genitourinary   Musculoskeletal negative musculoskeletal ROS (+)   Abdominal   Peds negative pediatric ROS (+)  Hematology  (+) anemia ,   Anesthesia Other Findings   Reproductive/Obstetrics negative OB ROS                         Anesthesia Physical Anesthesia Plan  ASA: III  Anesthesia Plan: General   Post-op Pain Management:    Induction: Intravenous  Airway Management Planned: Oral ETT  Additional Equipment:   Intra-op Plan:   Post-operative Plan: Extubation in OR  Informed Consent: I have reviewed the patients History and Physical, chart, labs and discussed the procedure including the risks, benefits and alternatives for the proposed anesthesia with the patient or authorized representative who has indicated his/her understanding and acceptance.   Dental advisory given  Plan Discussed with: CRNA  Anesthesia Plan Comments: (OETT for  symptomatic GERD)      Anesthesia Quick Evaluation

## 2013-06-15 NOTE — Op Note (Signed)
Preoperative diagnosis: Gross hematuria, multiple right renal calculi Postoperative diagnosis: Same  Procedure: Cystoscopy, flexible ureteroscopy, holmium laser lithotripsy, basket of stone fragments, right double-J stent placement 6 French x24 cm   Surgeon: Bernestine Amass M.D.  Anesthesia: Gen.  Indications: [name] is 68 years of age he has persisted in having intermittent gross hematuria. Urine cytology was negative. CT imaging revealed bilateral renal calculi with the largest stone burden being in the lower pole of the right kidney. Cystoscopy failed to reveal any evidence of bladder tumor. The patient continued to have intermittent hematuria while on anticoagulation. On repeat cystoscopy bloody urine could be seen clearly coming from the right ureteral orifice. For that reason we felt that treatment of the right lower pole renal calculi was indicated. He accepted that recommendation. His anticoagulation is currently being held with permission of his cardiologist.     Technique and findings: This was brought the operating room where he had successful induction general anesthesia. Placed in lithotomy position and prepped and draped in usual manner. Appropriate surgical timeout was performed. Rigid cystoscopy revealed unremarkable anterior and prostatic urethra. The bladder was also endoscopically normal once again. A guidewire was placed the right renal pelvis. Over that we placed a laser sheath. A digital flexible ureteroscope was then inserted. The calyceal system was carefully inspected and I saw no active bleeding at this time and no evidence of significant pathology other than 2 approximately 7 10mm stones in the lower pole the right kidney.   A 200  holmium laser lithotripter fiber at 0.8 J x8 Hz was utilized. The stones were very dense and are likely to be calcium oxylate monohydrate. It took considerable time to fracture the stone into numerous pieces. The stones were successfully treated.  Service time was prolonged we spent well over an hour on the holmium laser lithotripsy with then subsequent basketing of multiple fragments. At least 12 separate stone fragments were basket extracted through the sheath. At the completion of the procedure I placed a 6 French 24 cm double-J stent. Good positioning was confirmed. The bladder was drained. The patient appeared to have no obvious complications and was brought to recovery room stable condition.

## 2013-06-15 NOTE — Interval H&P Note (Signed)
History and Physical Interval Note:  06/15/2013 8:30 AM  [name]  has presented today for surgery, with the diagnosis of GROSS HEMATURIA, RENAL CALCULI  The various methods of treatment have been discussed with the patient and family. After consideration of risks, benefits and other options for treatment, the patient has consented to  Procedure(s): CYSTOSCOPY WITH RETROGRADE PYELOGRAM, URETEROSCOPY AND STENT PLACEMENT (Right) HOLMIUM LASER APPLICATION (Right) as a surgical intervention .  The patient's history has been reviewed, patient examined, no change in status, stable for surgery.  I have reviewed the patient's chart and labs.  Questions were answered to the patient's satisfaction.     Athalia Setterlund S

## 2013-06-16 ENCOUNTER — Encounter (HOSPITAL_COMMUNITY): Payer: Self-pay | Admitting: Urology

## 2013-06-16 DIAGNOSIS — G4733 Obstructive sleep apnea (adult) (pediatric): Secondary | ICD-10-CM

## 2013-06-16 NOTE — Sleep Study (Signed)
   NAME: [name] DATE OF BIRTH:  1945/06/11 MEDICAL RECORD NUMBER [id number]  LOCATION: [facility]  PHYSICIAN: Kathee Delton  DATE OF STUDY: 06/04/2013  SLEEP STUDY TYPE: Nocturnal Polysomnogram               REFERRING PHYSICIAN: Kathee Delton, MD  INDICATION FOR STUDY: Hypersomnia with sleep apnea. The patient continues to have restless sleep and daytime sleepiness despite wearing bilevel compliantly.  EPWORTH SLEEPINESS SCORE:  2 HEIGHT: 5\' 10"  (177.8 cm)  WEIGHT: 285 lb (129.275 kg)    Body mass index is 40.89 kg/(m^2).  NECK SIZE: 17.5 in.  MEDICATIONS: Reviewed in sleep record  SLEEP ARCHITECTURE: The patient had a total sleep time of 339 minutes with no slow-wave sleep and decreased quantity of REM. Sleep onset latency was normal at 26 minutes, and REM onset was delayed at 290 minutes. Sleep efficiency was moderately reduced at 78%.  RESPIRATORY DATA: The patient underwent a bilevel titration with a medium ResMed Swift FX nasal pillows apparatus. His pressure was gradually increased in order to control both snoring and obstructive events. He was found to have optimal bilevel pressure of 11/7, with good control even through REM.  OXYGEN DATA: There was oxygen desaturation as low as 86% until the patient reached optimal pressure level  CARDIAC DATA: Occasional PAC noted  MOVEMENT/PARASOMNIA: The patient was found to have 438 periodic limb movements, with 7 per hour resulting in arousal or awakening. There were no abnormal behaviors seen.  IMPRESSION/ RECOMMENDATION:    1) good control of previously documented obstructive sleep apnea with a bilevel pressure of 11/7, and delivered by medium ResMed Swift FX nasal pillows. The patient should also be encouraged to work aggressively on weight loss.  2) occasional PAC noted, but no clinically significant arrhythmias were seen  3) very large numbers of periodic limb movements with what appears to be  significant sleep disruption. Clinical correlation is suggested to see if the patient may have a primary movement disorder of sleep which is contributing to his poor sleep efficiency.    Kathee Delton Diplomate, American Board of Sleep Medicine  ELECTRONICALLY SIGNED ON:  06/16/2013, 10:09 AM [place] PH: [phone]   FX: [phone] [place]

## 2013-06-17 ENCOUNTER — Telehealth: Payer: Self-pay | Admitting: Pulmonary Disease

## 2013-06-17 ENCOUNTER — Other Ambulatory Visit: Payer: Self-pay | Admitting: Pulmonary Disease

## 2013-06-17 DIAGNOSIS — G4733 Obstructive sleep apnea (adult) (pediatric): Secondary | ICD-10-CM

## 2013-06-17 NOTE — Telephone Encounter (Signed)
Called pt and discussed bilevel titration study.  He did well with pressure 11/7 and nasal pillows.  Pt would like to go on this.  He did have large PLMS, and admits to kicking at night. He does not have RLS symptoms.  Will treat with new device first, then if still symptomatic, will start on dopamine agonist.

## 2013-06-23 ENCOUNTER — Encounter (HOSPITAL_BASED_OUTPATIENT_CLINIC_OR_DEPARTMENT_OTHER): Payer: Medicare Other

## 2013-06-23 ENCOUNTER — Other Ambulatory Visit: Payer: Self-pay | Admitting: Family Medicine

## 2013-06-23 DIAGNOSIS — I2510 Atherosclerotic heart disease of native coronary artery without angina pectoris: Secondary | ICD-10-CM

## 2013-06-24 ENCOUNTER — Telehealth: Payer: Self-pay | Admitting: Pulmonary Disease

## 2013-06-24 NOTE — Telephone Encounter (Signed)
I spoke with patient-he states that he is very upset that [facility] wants him to come to their [place] office more than once to pick up his BiPAP machine then turn around after his mask arrives(they do not have in stock) to pick that up; he feels that they should come to his home and arrange set up of BiPAP and mask,etc. He is also upset that (he feels) [place] did not listen to his concerns about this issue, the nurse expressed no concern, and the [facility] that helped him told him that he would get a call about which DME he would be switched to(states he never received a call about which DME his order was sent to). I apologized to the patient for the misunderstanding and asured him that I would help clear this situation up to the best of my ability.   I called [employer]; spoke with Carol-explained the situation to her about the patients BiPAP machine and mask. Arbie Cookey doubled checked to see if the mask was in stock-they do not have one at this time and would need to order from their warehouse. Arbie Cookey stated that they could arrange for a RT to go to the patients home to set up BiPAP and mask on Tuesday 06-30-13. Also, since patient has current CPAP (although is intolerant) they could red-line(express mail) the needed mask to the patient to get Friday and use with CPAP in the mean time to have some positive airway pressure until RT can go to patients home.   I explained the above to the patient who has agreed to have mask red lined to him and use CPAP until RT can get to his home with BiPAP on Tuesday 06-30-13. Pt states that he has not had any CPAP therapy since 05-03-13. Arbie Cookey was made aware of the urgency of this order as well.   Pt requested that upper team lead, manager, and [place] be made aware of his concerns. I have spoken with [place] who will call him Thursday to speak with him regarding his concerns as well as Friday to make sure he received his mask from [place].    Once I hung up the phone the patient was more at ease and stated  he was appreciative that I took the time to listen and help clear the situation up.

## 2013-06-25 NOTE — Telephone Encounter (Signed)
lmomtcb for pt on his home and cell #s

## 2013-06-26 ENCOUNTER — Encounter: Payer: Self-pay | Admitting: Nurse Practitioner

## 2013-06-26 ENCOUNTER — Ambulatory Visit (INDEPENDENT_AMBULATORY_CARE_PROVIDER_SITE_OTHER): Payer: Medicare Other | Admitting: Nurse Practitioner

## 2013-06-26 VITALS — BP 140/80 | HR 85 | Ht 70.0 in | Wt 281.1 lb

## 2013-06-26 DIAGNOSIS — I10 Essential (primary) hypertension: Secondary | ICD-10-CM

## 2013-06-26 DIAGNOSIS — I5032 Chronic diastolic (congestive) heart failure: Secondary | ICD-10-CM

## 2013-06-26 DIAGNOSIS — Z9889 Other specified postprocedural states: Secondary | ICD-10-CM

## 2013-06-26 MED ORDER — LISINOPRIL 5 MG PO TABS: 2.5000 mg | ORAL_TABLET | Freq: Every day | ORAL | Status: DC

## 2013-06-26 NOTE — Patient Instructions (Addendum)
Continue with your current medicines except cut the Lisinopril back to just a half a tablet daily  Continue to track your blood pressures at home  Stay off the Lipitor for now - will readdress on your return visit  Will see about getting back to cardiac rehab  See Dr. Lovena Le back as planned in May  Call the [place] office at [phone] if you have any questions, problems or concerns.

## 2013-06-26 NOTE — Telephone Encounter (Signed)
[name] spoke with pt.  He has received the mask and will use it with cpap until new machine arrives. He voiced no further questions or concerns at this time.
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
## 2013-06-29 ENCOUNTER — Other Ambulatory Visit (INDEPENDENT_AMBULATORY_CARE_PROVIDER_SITE_OTHER): Payer: Medicare Other

## 2013-06-29 DIAGNOSIS — I2510 Atherosclerotic heart disease of native coronary artery without angina pectoris: Secondary | ICD-10-CM

## 2013-06-29 LAB — LIPID PANEL
CHOLESTEROL: 153 mg/dL (ref 0–200)
HDL: 32.8 mg/dL — AB (ref >39.00)
LDL Cholesterol: 96 mg/dL (ref 0–99)
Total CHOL/HDL Ratio: 5
Triglycerides: 121 mg/dL (ref 0.0–149.0)
VLDL: 24.2 mg/dL (ref 0.0–40.0)

## 2013-07-06 ENCOUNTER — Ambulatory Visit: Payer: Medicare Other | Admitting: Family Medicine

## 2013-07-07 ENCOUNTER — Ambulatory Visit: Payer: Medicare Other | Admitting: Family Medicine

## 2013-07-09 ENCOUNTER — Ambulatory Visit (INDEPENDENT_AMBULATORY_CARE_PROVIDER_SITE_OTHER): Payer: Medicare Other | Admitting: Family Medicine

## 2013-07-09 ENCOUNTER — Telehealth (HOSPITAL_COMMUNITY): Payer: Self-pay | Admitting: *Deleted

## 2013-07-09 ENCOUNTER — Encounter: Payer: Self-pay | Admitting: Family Medicine

## 2013-07-09 VITALS — BP 110/74 | HR 78 | Temp 97.6°F | Wt 281.8 lb

## 2013-07-09 DIAGNOSIS — R079 Chest pain, unspecified: Secondary | ICD-10-CM

## 2013-07-09 DIAGNOSIS — E119 Type 2 diabetes mellitus without complications: Secondary | ICD-10-CM

## 2013-07-09 NOTE — Patient Instructions (Signed)
Take care.  Recheck A1c in about 6 months before a visit.  Use a pillow and the breathing exercises as we talked about.  Glad to see you.

## 2013-07-09 NOTE — Progress Notes (Signed)
Pre visit review using our clinic review tool, if applicable. No additional management support is needed unless otherwise documented below in the visit note.  Stopped norvasc and lipitor.  Had aches, better now.  Lipids okay except for low HDL.  BP okay in the meantime, ~240-973 systolic.  Pulse has been ~80s now.    Diabetes:  No meds Hypoglycemic episodes:no Hyperglycemic episodes:no Feet problems: Blood Sugars averaging: eye exam within last year: A1c 6.6  Sternal pain some better now.  Worse with deep breath and cough.  Sore to touch.  Hospital records noted.    PMH and SH reviewed  Meds, vitals, and allergies reviewed.   ROS: See HPI.  Otherwise negative.    GEN: nad, alert and oriented HEENT: mucous membranes moist NECK: supple w/o LA CV: rrr. PULM: ctab, no inc wob, sternum ttp, pain with deep breath- less with external compression during a deep breath.   ABD: soft, +bs EXT: no edema SKIN: no acute rash but seb cyst noted on back of neck, no inflamed.  We agreed to defer/observe this for now.

## 2013-07-10 ENCOUNTER — Telehealth: Payer: Self-pay | Admitting: Internal Medicine

## 2013-07-10 DIAGNOSIS — E1149 Type 2 diabetes mellitus with other diabetic neurological complication: Secondary | ICD-10-CM | POA: Insufficient documentation

## 2013-07-10 NOTE — Assessment & Plan Note (Signed)
A1c okay at 6.6, no meds.  D/w pt. Continue as is.  Recheck later in 2015.

## 2013-07-10 NOTE — Assessment & Plan Note (Addendum)
Chest wall pain.  D/w pt about external compression and deep breathing. I sent a note over about his status with cardiac rehab and I'll await input.  App help of all involved.  >25 minutes spent in face to face time with patient, >50% spent in counselling or coordination of care.

## 2013-07-10 NOTE — Telephone Encounter (Signed)
New message   Verdis Frederickson from calling asking for you to call her when you get a chance.

## 2013-07-10 NOTE — Telephone Encounter (Signed)
I let hr know I have information to show Dr Lovena Le

## 2013-07-21 ENCOUNTER — Telehealth: Payer: Self-pay

## 2013-07-21 NOTE — Telephone Encounter (Signed)
Relevant patient education assigned to patient using Emmi. ° °

## 2013-08-18 ENCOUNTER — Ambulatory Visit (INDEPENDENT_AMBULATORY_CARE_PROVIDER_SITE_OTHER): Payer: Medicare Other | Admitting: Internal Medicine

## 2013-08-18 ENCOUNTER — Encounter: Payer: Self-pay | Admitting: Internal Medicine

## 2013-08-18 VITALS — BP 145/88 | HR 75 | Ht 70.0 in | Wt 281.8 lb

## 2013-08-18 DIAGNOSIS — I4819 Other persistent atrial fibrillation: Secondary | ICD-10-CM

## 2013-08-18 DIAGNOSIS — I4891 Unspecified atrial fibrillation: Secondary | ICD-10-CM

## 2013-08-18 DIAGNOSIS — Z7901 Long term (current) use of anticoagulants: Secondary | ICD-10-CM

## 2013-08-18 DIAGNOSIS — I10 Essential (primary) hypertension: Secondary | ICD-10-CM

## 2013-08-18 DIAGNOSIS — I480 Paroxysmal atrial fibrillation: Secondary | ICD-10-CM

## 2013-08-18 DIAGNOSIS — I5032 Chronic diastolic (congestive) heart failure: Secondary | ICD-10-CM

## 2013-08-18 MED ORDER — RIVAROXABAN 20 MG PO TABS: 20.0000 mg | ORAL_TABLET | Freq: Every day | ORAL | Status: DC

## 2013-08-18 MED ORDER — LISINOPRIL 2.5 MG PO TABS: 2.5000 mg | ORAL_TABLET | Freq: Every day | ORAL | Status: DC

## 2013-08-18 NOTE — Assessment & Plan Note (Signed)
He has been intolerant of coumadin and now pradaxa with disabling side effects. He will be started on Xarelto

## 2013-08-18 NOTE — Assessment & Plan Note (Signed)
His symptoms are class 2. I have asked the patient to consider cardiac rehab. He is also instructed to try and lose weight and maintain a low sodium diet.

## 2013-08-18 NOTE — Assessment & Plan Note (Signed)
His blood pressure is elevated. Will restart lisinopril in low dose with plans to uptitrate as needed.

## 2013-08-18 NOTE — Assessment & Plan Note (Signed)
He appears to be maintaining NSR. His ventricular rate is well controlled. He will continue his current meds.
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
## 2013-08-18 NOTE — Patient Instructions (Signed)
Your physician wants you to follow-up in: 6 months with Dr [name] will receive a reminder letter in the mail two months in advance. If you don't receive a letter, please call our office to schedule the follow-up appointment.  You have been referred to [facility] in [place]--- [place] has recommended you make the following change in your medication:  1) Start Lisinopril 2.5mg  daily 2) Stop Pradaxa---indigestion 3) Start  Xarelto 20mg  daily

## 2013-09-10 ENCOUNTER — Telehealth: Payer: Self-pay | Admitting: *Deleted

## 2013-09-10 NOTE — Telephone Encounter (Signed)
PA to [facility] for xarelto 

## 2013-09-10 NOTE — Telephone Encounter (Signed)
xarelto approved through 09/11/2014 by [facility]

## 2013-09-10 NOTE — Telephone Encounter (Signed)
[facility] in [place] notified

## 2013-09-24 ENCOUNTER — Encounter: Payer: Self-pay | Admitting: Family Medicine

## 2013-09-24 ENCOUNTER — Ambulatory Visit (INDEPENDENT_AMBULATORY_CARE_PROVIDER_SITE_OTHER): Payer: Medicare Other | Admitting: Family Medicine

## 2013-09-24 VITALS — BP 132/76 | HR 83 | Temp 97.8°F | Wt 282.2 lb

## 2013-09-24 DIAGNOSIS — R059 Cough, unspecified: Secondary | ICD-10-CM

## 2013-09-24 DIAGNOSIS — R05 Cough: Secondary | ICD-10-CM

## 2013-09-24 MED ORDER — BENZONATATE 200 MG PO CAPS
200.0000 mg | ORAL_CAPSULE | Freq: Three times a day (TID) | ORAL | Status: DC | PRN
Start: 2013-09-24 — End: 2013-09-29

## 2013-09-24 MED ORDER — DOXYCYCLINE HYCLATE 100 MG PO TABS: 100.0000 mg | ORAL_TABLET | Freq: Two times a day (BID) | ORAL | Status: DC

## 2013-09-24 NOTE — Progress Notes (Signed)
Pre visit review using our clinic review tool, if applicable. No additional management support is needed unless otherwise documented below in the visit note.  Granddaughter was sick with bronchitis. She was treated in the meantime.  He has been coughing for 3 weeks.  Taking mucinex.  Thick sputum.  Some wheeze.  No fevers now, prev ~100 (last week).  No vomiting.  No ear pain.  No facial pain but nose if stuffy.  Already on flonase and zyrtec.  He isn't getting worse but isn't getting better either.   Meds, vitals, and allergies reviewed.   ROS: See HPI.  Otherwise, noncontributory.  GEN: nad, alert and oriented HEENT: mucous membranes moist, tm w/o erythema, nasal exam w/o erythema, clear discharge noted,  OP with cobblestoning NECK: supple w/o LA CV: rrr.   PULM: ctab, no inc wob, no focal dec in BS, no wheeze, cough noted EXT: no edema SKIN: no acute rash

## 2013-09-24 NOTE — Patient Instructions (Signed)
Use tessalon for the cough and start doxycycline today.  Take care.  Glad to see you.

## 2013-09-25 DIAGNOSIS — R05 Cough: Secondary | ICD-10-CM | POA: Insufficient documentation

## 2013-09-25 DIAGNOSIS — R059 Cough, unspecified: Secondary | ICD-10-CM | POA: Insufficient documentation

## 2013-09-25 NOTE — Assessment & Plan Note (Signed)
Nontoxic, would treat given the duration.  D/w pt.  Use tessalon for cough and start doxy in the meantime.  He agrees.  Call back prn.  Okay for outpatient f/u.

## 2013-09-29 ENCOUNTER — Ambulatory Visit (INDEPENDENT_AMBULATORY_CARE_PROVIDER_SITE_OTHER): Payer: Medicare Other | Admitting: Pulmonary Disease

## 2013-09-29 ENCOUNTER — Encounter: Payer: Self-pay | Admitting: Pulmonary Disease

## 2013-09-29 VITALS — BP 130/80 | HR 68 | Temp 98.0°F | Ht 70.0 in | Wt 286.0 lb

## 2013-09-29 DIAGNOSIS — G4733 Obstructive sleep apnea (adult) (pediatric): Secondary | ICD-10-CM

## 2013-09-29 DIAGNOSIS — G2581 Restless legs syndrome: Secondary | ICD-10-CM

## 2013-09-29 MED ORDER — ROPINIROLE HCL 0.5 MG PO TABS: ORAL_TABLET | ORAL | Status: DC

## 2013-09-29 NOTE — Progress Notes (Signed)
   Subjective:    Patient ID: [name], male    DOB: 07-19-1945, 68 y.o.   MRN: [id number]  HPI Patient comes in today for followup of his obstructive sleep apnea. He has been started on bilevel, and his download shows excellent compliance and great control of his sleep disordered breathing. His mask is fitting well, and a downloaded shows no significant mask leak. He feels that he is doing very well from this standpoint, but is still having some awakenings during the night because of kicking. His wife has also noted this, and he does have intermittent RLS symptoms in the evening before going to bed.   Review of Systems  Constitutional: Negative for fever and unexpected weight change.  HENT: Negative for congestion, dental problem, ear pain, nosebleeds, postnasal drip, rhinorrhea, sinus pressure, sneezing, sore throat and trouble swallowing.   Eyes: Negative for redness and itching.  Respiratory: Negative for cough, chest tightness, shortness of breath and wheezing.   Cardiovascular: Negative for palpitations and leg swelling.  Gastrointestinal: Negative for nausea and vomiting.  Genitourinary: Negative for dysuria.  Musculoskeletal: Negative for joint swelling.  Skin: Negative for rash.  Neurological: Negative for headaches.  Hematological: Does not bruise/bleed easily.  Psychiatric/Behavioral: Negative for dysphoric mood. The patient is not nervous/anxious.        Objective:   Physical Exam Morbidly obese male in no acute distress Nose without purulence or discharge noted No skin breakdown or pressure necrosis from the CPAP Neck without lymphadenopathy or thyromegaly Lower extremities with mild edema, no cyanosis Alert and oriented, moves all 4 extremities.       Assessment & Plan:

## 2013-09-29 NOTE — Patient Instructions (Signed)
Continue on Bilevel as you are doing.  Your download looks great! Will treat with requip 0.5mg  one each night after dinner, and can increase to 2 if you are still having symptoms after a week. Work on weight loss. followup with me in 33mos, but call in 3 weeks with your response to the requip.

## 2013-09-29 NOTE — Assessment & Plan Note (Signed)
The patient is doing very well from a sleep apnea standpoint on bilevel according to his download. He is not having breakthrough snoring or events, and his mask is fitting well. He is having some awakenings and kicking during the night that is most likely secondary to RLS.

## 2013-09-29 NOTE — Assessment & Plan Note (Signed)
The patient continues to have taking during the night as well as RLS symptoms in the late evening despite his sleep apnea being well controlled with bilevel. His sleep study did show a very large numbers of periodic limb movements with significant sleep disruption. We'll go ahead and treat him with a course of Requip to see if he has improvement.

## 2013-10-17 ENCOUNTER — Telehealth: Payer: Self-pay | Admitting: Pulmonary Disease

## 2013-10-20 ENCOUNTER — Telehealth: Payer: Self-pay | Admitting: Pulmonary Disease

## 2013-10-20 NOTE — Telephone Encounter (Signed)
Called spoke with pt. He reports the requip has helped with his restless legs. He still wakes up about 3-4 times per night but does not feel it is related to RL. Not sure the cause per pt. He takes the requip 0.5 mg 2 at bedtime. He will need an updated RX since the rx we sent in was only for 30 tabs. x1 refill on 09/29/13. Please advise [facility] thanks

## 2013-10-20 NOTE — Telephone Encounter (Signed)
Ok to fill for one  Year.

## 2013-10-21 MED ORDER — ROPINIROLE HCL 0.5 MG PO TABS: ORAL_TABLET | ORAL | Status: DC

## 2013-10-21 NOTE — Telephone Encounter (Signed)
lmomtcb x1 for pt RX has been sent

## 2013-10-22 ENCOUNTER — Telehealth: Payer: Self-pay | Admitting: Pulmonary Disease

## 2013-10-22 NOTE — Telephone Encounter (Signed)
Pt aware. [name], CMA  

## 2013-10-22 NOTE — Telephone Encounter (Signed)
Called spoke with pt. He reports to disregard this message. Nothing further needed

## 2013-12-29 ENCOUNTER — Ambulatory Visit (INDEPENDENT_AMBULATORY_CARE_PROVIDER_SITE_OTHER): Payer: Medicare Other | Admitting: Family Medicine

## 2013-12-29 ENCOUNTER — Encounter: Payer: Self-pay | Admitting: Family Medicine

## 2013-12-29 VITALS — BP 160/80 | HR 60 | Temp 98.0°F | Wt 288.2 lb

## 2013-12-29 DIAGNOSIS — J0100 Acute maxillary sinusitis, unspecified: Secondary | ICD-10-CM

## 2013-12-29 DIAGNOSIS — L089 Local infection of the skin and subcutaneous tissue, unspecified: Secondary | ICD-10-CM

## 2013-12-29 MED ORDER — DOXYCYCLINE HYCLATE 100 MG PO TABS: 100.0000 mg | ORAL_TABLET | Freq: Two times a day (BID) | ORAL | Status: DC

## 2013-12-29 NOTE — Assessment & Plan Note (Signed)
Wound cx pending.  Concern for mrsa.  None need formal I&D. Most have ruptured.  Start doxy and keep clean/covered. He agrees.

## 2013-12-29 NOTE — Progress Notes (Signed)
Pre visit review using our clinic review tool, if applicable. No additional management support is needed unless otherwise documented below in the visit note.  Nasal congestion. R side of throat has been irritated.  Has been taking mucinex with some relief. Had not used flonase a lot.  Hasn't tried nasal saline.  He seems to be stuffier on the R nostril, that is a chronic condition, but more bothersome recently.  No FCNAVD.  No ear pain.  Minimal cough.  Sinuses (maxillary) sore. All sx going on for about 1 week.   Mult sores on abd. Can get tender, episodically.  Some pus drainage.    Meds, vitals, and allergies reviewed.   ROS: See HPI.  Otherwise, noncontributory.  GEN: nad, alert and oriented HEENT: mucous membranes moist, tm w/o erythema, nasal exam w/o erythema, clear discharge noted,  OP with cobblestoning, R max sinus ttp NECK: supple w/o LA CV: rrr.   PULM: ctab, no inc wob EXT: no edema SKIN: Mult pustules on the abd, wound cult collected.

## 2013-12-29 NOTE — Patient Instructions (Signed)
Start back on the flonase, use the doxycycline, and keep your abdomen clean and covered.  Take care.

## 2013-12-29 NOTE — Assessment & Plan Note (Signed)
Nontoxid, start doxy, use flonas and f/u prn.  He agrees.

## 2014-01-02 LAB — WOUND CULTURE
Gram Stain: NONE SEEN
Gram Stain: NONE SEEN

## 2014-01-04 ENCOUNTER — Other Ambulatory Visit: Payer: Medicare Other

## 2014-01-08 ENCOUNTER — Ambulatory Visit: Payer: Medicare Other | Admitting: Family Medicine

## 2014-01-10 ENCOUNTER — Other Ambulatory Visit: Payer: Self-pay | Admitting: Family Medicine

## 2014-01-10 DIAGNOSIS — E119 Type 2 diabetes mellitus without complications: Secondary | ICD-10-CM

## 2014-01-10 DIAGNOSIS — I10 Essential (primary) hypertension: Secondary | ICD-10-CM

## 2014-01-15 ENCOUNTER — Other Ambulatory Visit: Payer: Medicare Other

## 2014-01-18 ENCOUNTER — Other Ambulatory Visit (INDEPENDENT_AMBULATORY_CARE_PROVIDER_SITE_OTHER): Payer: Medicare Other

## 2014-01-18 DIAGNOSIS — E119 Type 2 diabetes mellitus without complications: Secondary | ICD-10-CM

## 2014-01-18 LAB — LIPID PANEL
CHOL/HDL RATIO: 6
Cholesterol: 122 mg/dL (ref 0–200)
HDL: 21.1 mg/dL — ABNORMAL LOW (ref >39.00)
LDL Cholesterol: 82 mg/dL (ref 0–99)
NONHDL: 100.9
Triglycerides: 97 mg/dL (ref 0.0–149.0)
VLDL: 19.4 mg/dL (ref 0.0–40.0)

## 2014-01-18 LAB — COMPREHENSIVE METABOLIC PANEL
ALBUMIN: 2.9 g/dL — AB (ref 3.5–5.2)
ALT: 12 U/L (ref 0–53)
AST: 21 U/L (ref 0–37)
Alkaline Phosphatase: 102 U/L (ref 39–117)
BUN: 13 mg/dL (ref 6–23)
CALCIUM: 8.5 mg/dL (ref 8.4–10.5)
CHLORIDE: 103 meq/L (ref 96–112)
CO2: 28 mEq/L (ref 19–32)
Creatinine, Ser: 1 mg/dL (ref 0.4–1.5)
GFR: 82.92 mL/min (ref >60.00)
Glucose, Bld: 148 mg/dL — ABNORMAL HIGH (ref 70–99)
POTASSIUM: 3.2 meq/L — AB (ref 3.5–5.1)
Sodium: 141 mEq/L (ref 135–145)
TOTAL PROTEIN: 8.3 g/dL (ref 6.0–8.3)
Total Bilirubin: 1.4 mg/dL — ABNORMAL HIGH (ref 0.2–1.2)

## 2014-01-18 LAB — HEMOGLOBIN A1C: HEMOGLOBIN A1C: 6 % (ref 4.6–6.5)

## 2014-01-18 LAB — TSH: TSH: 2.46 u[IU]/mL (ref 0.35–4.50)

## 2014-01-22 ENCOUNTER — Encounter: Payer: Self-pay | Admitting: Family Medicine

## 2014-01-22 ENCOUNTER — Ambulatory Visit (INDEPENDENT_AMBULATORY_CARE_PROVIDER_SITE_OTHER): Payer: Medicare Other | Admitting: Family Medicine

## 2014-01-22 VITALS — BP 124/82 | HR 60 | Temp 97.8°F | Wt 281.8 lb

## 2014-01-22 DIAGNOSIS — M2550 Pain in unspecified joint: Secondary | ICD-10-CM

## 2014-01-22 DIAGNOSIS — M129 Arthropathy, unspecified: Secondary | ICD-10-CM

## 2014-01-22 DIAGNOSIS — L089 Local infection of the skin and subcutaneous tissue, unspecified: Secondary | ICD-10-CM

## 2014-01-22 DIAGNOSIS — E119 Type 2 diabetes mellitus without complications: Secondary | ICD-10-CM

## 2014-01-22 DIAGNOSIS — J0100 Acute maxillary sinusitis, unspecified: Secondary | ICD-10-CM

## 2014-01-22 MED ORDER — AMOXICILLIN-POT CLAVULANATE 875-125 MG PO TABS: 1.0000 | ORAL_TABLET | Freq: Two times a day (BID) | ORAL | Status: DC

## 2014-01-22 NOTE — Patient Instructions (Signed)
Recheck in 6 months at a physical.  Rosaria Ferries will call about your referral. Start augmentin in the meantime and use dial soap or similar.  If not improved, then notify.  Call back about getting a flu shot when you feel better.  Take care.

## 2014-01-22 NOTE — Progress Notes (Signed)
Pre visit review using our clinic review tool, if applicable. No additional management support is needed unless otherwise documented below in the visit note.  Prev sinus sx got better but not resolved on doxy.  Worse in the last week. On nasal steroids at baseline.  Taking mucinex and robitussin with a little help.  Taking some advil, d/w pt.   No fevers, no chills.  Stuffy.  + facial pain.  Some cough, some discolored sputum. Discolored rhinorrhea.    Skin lesions on abdomen, some better but not resolved.  Done with doxy now.  Abdominal location only.  Prev culture d/w pt.   Joint aches.  "All the time."  Had seen ortho prev- possible psoriatic arthritis dx.  Prev with joint injections- B wrists.  B ankle knee hip shoulder elbow and wrist pain.  All worse in the last 4-5 months.  Discussed rheum eval.    Diabetes:  No meds Hypoglycemic episodes: no Hyperglycemic episodes: no Feet problems: no change in sensation Blood Sugars averaging: not checked eye exam within last year: 2-3 months ago, per patient, normal exam per patient.  A1c down to 6.  D/w pt.  Working hard on diet.    Flu shot not done yet. Deferred for now.   Meds, vitals, and allergies reviewed.   ROS: See HPI.  Otherwise negative.    GEN: nad, alert and oriented HEENT: mucous membranes moist, tm w/o erythema, nasal exam w/o erythema, clear discharge noted,  OP with cobblestoning, max sinuses ttp NECK: supple w/o LA CV: rrr.   PULM: ctab, no inc wob EXT: no edema SKIN: no acute rash but lesions on abd wall noted. No pustules but some scabbed/unroofed lesions not fully healed.    Diabetic foot exam: Normal inspection No skin breakdown No calluses  Normal DP pulses Normal sensation to light touch and monofilament Nails normal

## 2014-01-24 DIAGNOSIS — M129 Arthropathy, unspecified: Secondary | ICD-10-CM | POA: Insufficient documentation

## 2014-01-24 NOTE — Assessment & Plan Note (Signed)
Augmentin, continue flonase.  Nontoxic, f/u prn.  He agrees.

## 2014-01-24 NOTE — Assessment & Plan Note (Signed)
Controlled A1c, d/w pt.  He'll need to continue work on weight.  No new DM2 meds added on.

## 2014-01-24 NOTE — Assessment & Plan Note (Signed)
Refer, he agrees.  >25 minutes spent in face to face time with patient, >50% spent in counselling or coordination of care.

## 2014-01-24 NOTE — Assessment & Plan Note (Signed)
Improved, not resolved, s/p doxy, change to augmentin and f/u rpn.  He agrees.

## 2014-02-11 ENCOUNTER — Telehealth: Payer: Self-pay | Admitting: Hematology and Oncology

## 2014-02-11 NOTE — Telephone Encounter (Signed)
SPOKE WITH LINDA PT'S WIFE ASKED TO CALL BACK REGARDING APPT. 02/23/14 9:00AM

## 2014-02-16 ENCOUNTER — Ambulatory Visit: Payer: Medicare Other | Admitting: Internal Medicine

## 2014-02-23 ENCOUNTER — Ambulatory Visit (HOSPITAL_BASED_OUTPATIENT_CLINIC_OR_DEPARTMENT_OTHER): Payer: Medicare Other | Admitting: Hematology and Oncology

## 2014-02-23 ENCOUNTER — Encounter: Payer: Self-pay | Admitting: Hematology and Oncology

## 2014-02-23 ENCOUNTER — Ambulatory Visit: Payer: Medicare Other

## 2014-02-23 ENCOUNTER — Telehealth: Payer: Self-pay | Admitting: Hematology and Oncology

## 2014-02-23 VITALS — BP 171/67 | HR 54 | Temp 97.6°F | Resp 19 | Ht 70.0 in | Wt 280.2 lb

## 2014-02-23 DIAGNOSIS — R768 Other specified abnormal immunological findings in serum: Secondary | ICD-10-CM | POA: Insufficient documentation

## 2014-02-23 DIAGNOSIS — R760 Raised antibody titer: Secondary | ICD-10-CM

## 2014-02-23 NOTE — Progress Notes (Signed)
Checked in new pt with no financial concerns at this time.  Pt is here for a hematology concern so financial concerns may not be needed but he has my card for any questions or concerns he may have in the future.

## 2014-02-23 NOTE — Telephone Encounter (Signed)
Gave avs & cal for Nov/Dec. °

## 2014-02-24 ENCOUNTER — Encounter: Payer: Self-pay | Admitting: Internal Medicine

## 2014-02-24 ENCOUNTER — Ambulatory Visit (INDEPENDENT_AMBULATORY_CARE_PROVIDER_SITE_OTHER): Payer: Medicare Other | Admitting: Internal Medicine

## 2014-02-24 ENCOUNTER — Other Ambulatory Visit: Payer: Self-pay | Admitting: Cardiovascular Disease

## 2014-02-24 VITALS — BP 130/78 | HR 67 | Ht 70.0 in | Wt 281.0 lb

## 2014-02-24 DIAGNOSIS — I4729 Other ventricular tachycardia: Secondary | ICD-10-CM

## 2014-02-24 DIAGNOSIS — I480 Paroxysmal atrial fibrillation: Secondary | ICD-10-CM

## 2014-02-24 DIAGNOSIS — I472 Ventricular tachycardia: Secondary | ICD-10-CM

## 2014-02-24 MED ORDER — FUROSEMIDE 40 MG PO TABS: 40.0000 mg | ORAL_TABLET | Freq: Every morning | ORAL | Status: DC

## 2014-02-24 MED ORDER — APIXABAN 5 MG PO TABS: 5.0000 mg | ORAL_TABLET | Freq: Two times a day (BID) | ORAL | Status: DC

## 2014-02-24 NOTE — Assessment & Plan Note (Signed)
He has had no recurrent symptomatic atrial fibrillation. He will continue amiodarone 200 mg daily.

## 2014-02-24 NOTE — Assessment & Plan Note (Signed)
His blood pressure is well controlled. No change in medical therapy. 
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
## 2014-02-24 NOTE — Patient Instructions (Signed)
Your physician has recommended you make the following change in your medication:  1) Stop xarelto 2) Start eliquis 5 mg one tablet by mouth twice daily.  Your physician wants you to follow-up in: 1 year with Dr. Lovena Le. You will receive a reminder letter in the mail two months in advance. If you don't receive a letter, please call our office to schedule the follow-up appointment.

## 2014-02-24 NOTE — Assessment & Plan Note (Signed)
His symptoms are well compensated and class II. He'll continue his current medical therapy, and I've asked him to maintain a low-sodium diet.
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
## 2014-02-24 NOTE — Assessment & Plan Note (Signed)
He has had no recurrent ventricular arrhythmias. He will continue amiodarone. He will minimize his caffeine intake.

## 2014-02-26 ENCOUNTER — Other Ambulatory Visit (HOSPITAL_BASED_OUTPATIENT_CLINIC_OR_DEPARTMENT_OTHER): Payer: Medicare Other

## 2014-02-26 ENCOUNTER — Ambulatory Visit (HOSPITAL_COMMUNITY)
Admission: RE | Admit: 2014-02-26 | Discharge: 2014-02-26 | Disposition: A | Discharge status: Home / Self Care | Payer: Medicare Other | Source: Ambulatory Visit | Attending: Hematology and Oncology | Admitting: Hematology and Oncology

## 2014-02-26 DIAGNOSIS — M4856XS Collapsed vertebra, not elsewhere classified, lumbar region, sequela of fracture: Secondary | ICD-10-CM | POA: Diagnosis not present

## 2014-02-26 DIAGNOSIS — M479 Spondylosis, unspecified: Secondary | ICD-10-CM | POA: Insufficient documentation

## 2014-02-26 DIAGNOSIS — C9000 Multiple myeloma not having achieved remission: Secondary | ICD-10-CM | POA: Diagnosis present

## 2014-02-26 DIAGNOSIS — R768 Other specified abnormal immunological findings in serum: Secondary | ICD-10-CM

## 2014-02-26 DIAGNOSIS — I517 Cardiomegaly: Secondary | ICD-10-CM | POA: Diagnosis not present

## 2014-02-26 DIAGNOSIS — M4602 Spinal enthesopathy, cervical region: Secondary | ICD-10-CM | POA: Insufficient documentation

## 2014-02-26 DIAGNOSIS — M4802 Spinal stenosis, cervical region: Secondary | ICD-10-CM | POA: Insufficient documentation

## 2014-02-26 DIAGNOSIS — I700 Atherosclerosis of aorta: Secondary | ICD-10-CM | POA: Insufficient documentation

## 2014-02-26 DIAGNOSIS — R760 Raised antibody titer: Secondary | ICD-10-CM

## 2014-02-26 LAB — CBC WITH DIFFERENTIAL/PLATELET
BASO%: 0.2 % (ref 0.0–2.0)
BASOS ABS: 0 10*3/uL (ref 0.0–0.1)
EOS%: 0.7 % (ref 0.0–7.0)
Eosinophils Absolute: 0.1 10*3/uL (ref 0.0–0.5)
HEMATOCRIT: 38.4 % (ref 38.4–49.9)
HEMOGLOBIN: 12.3 g/dL — AB (ref 13.0–17.1)
LYMPH%: 18.3 % (ref 14.0–49.0)
MCH: 29 pg (ref 27.2–33.4)
MCHC: 32 g/dL (ref 32.0–36.0)
MCV: 90.6 fL (ref 79.3–98.0)
MONO#: 0.5 10*3/uL (ref 0.1–0.9)
MONO%: 5.7 % (ref 0.0–14.0)
NEUT%: 75.1 % — AB (ref 39.0–75.0)
NEUTROS ABS: 6 10*3/uL (ref 1.5–6.5)
PLATELETS: 267 10*3/uL (ref 140–400)
RBC: 4.24 10*6/uL (ref 4.20–5.82)
RDW: 14.4 % (ref 11.0–14.6)
WBC: 8.1 10*3/uL (ref 4.0–10.3)
lymph#: 1.5 10*3/uL (ref 0.9–3.3)

## 2014-02-26 LAB — COMPREHENSIVE METABOLIC PANEL (CC13)
ALK PHOS: 150 U/L (ref 40–150)
ALT: 19 U/L (ref 0–55)
AST: 21 U/L (ref 5–34)
Albumin: 3.6 g/dL (ref 3.5–5.0)
Anion Gap: 11 mEq/L (ref 3–11)
BUN: 26.7 mg/dL — AB (ref 7.0–26.0)
CO2: 23 mEq/L (ref 22–29)
Calcium: 9 mg/dL (ref 8.4–10.4)
Chloride: 107 mEq/L (ref 98–109)
Creatinine: 1 mg/dL (ref 0.7–1.3)
Glucose: 205 mg/dl — ABNORMAL HIGH (ref 70–140)
POTASSIUM: 3.8 meq/L (ref 3.5–5.1)
SODIUM: 141 meq/L (ref 136–145)
TOTAL PROTEIN: 8.2 g/dL (ref 6.4–8.3)
Total Bilirubin: 0.74 mg/dL (ref 0.20–1.20)

## 2014-03-01 ENCOUNTER — Other Ambulatory Visit: Payer: Self-pay | Admitting: Internal Medicine

## 2014-03-01 LAB — KAPPA/LAMBDA LIGHT CHAINS, FREE, WITH RATIO, 24HR. URINE
KAPPA LIGHT CHAIN, FREE U: 31.1 mg/L — AB (ref 1.35–24.19)
Kappa/Lambda, Free Ratio: 15.02 — ABNORMAL HIGH (ref 2.04–10.37)
Lambda Light Chain, Free U: 2.07 mg/L (ref 0.24–6.66)

## 2014-03-02 LAB — UPEP/TP, 24-HR URINE
Collection Interval: 24 hours
Total Protein, Urine/Day: 2808 mg/d — ABNORMAL HIGH (ref 50–100)
Total Protein, Urine: 104 mg/dL
Total Volume, Urine: 2700 mL

## 2014-03-02 LAB — KAPPA/LAMBDA LIGHT CHAINS
KAPPA LAMBDA RATIO: 1.32 (ref 0.26–1.65)
Kappa free light chain: 2.1 mg/dL — ABNORMAL HIGH (ref 0.33–1.94)
LAMBDA FREE LGHT CHN: 1.59 mg/dL (ref 0.57–2.63)

## 2014-03-02 LAB — SPEP & IFE WITH QIG
ALBUMIN ELP: 47.1 % — AB (ref 55.8–66.1)
Alpha-1-Globulin: 4 % (ref 2.9–4.9)
Alpha-2-Globulin: 11 % (ref 7.1–11.8)
BETA GLOBULIN: 7.7 % — AB (ref 4.7–7.2)
Beta 2: 8.8 % — ABNORMAL HIGH (ref 3.2–6.5)
Gamma Globulin: 21.4 % — ABNORMAL HIGH (ref 11.1–18.8)
IGA: 647 mg/dL — AB (ref 68–379)
IgG (Immunoglobin G), Serum: 1870 mg/dL — ABNORMAL HIGH (ref 650–1600)
IgM, Serum: 48 mg/dL (ref 41–251)
TOTAL PROTEIN, SERUM ELECTROPHOR: 8.1 g/dL (ref 6.0–8.3)

## 2014-03-02 LAB — UIFE/LIGHT CHAINS/TP QN, 24-HR UR
ALPHA 1 UR: DETECTED — AB
ALPHA 2 UR: DETECTED — AB
Albumin, U: DETECTED
Beta, Urine: DETECTED — AB
GAMMA UR: DETECTED — AB
TIME-UPE24: 24 h
Total Protein, Urine-Ur/day: 2808 mg/d — ABNORMAL HIGH (ref <150)
Total Protein, Urine: 104 mg/dL — ABNORMAL HIGH (ref 5–25)
Volume, Urine: 2700 mL

## 2014-03-02 LAB — VITAMIN D 25 HYDROXY (VIT D DEFICIENCY, FRACTURES): Vit D, 25-Hydroxy: 14 ng/mL — ABNORMAL LOW (ref 30–100)

## 2014-03-02 LAB — BETA 2 MICROGLOBULIN, SERUM: BETA 2 MICROGLOBULIN: 2.78 mg/L — AB (ref <=2.51)

## 2014-03-11 ENCOUNTER — Telehealth: Payer: Self-pay | Admitting: Pulmonary Disease

## 2014-03-11 ENCOUNTER — Encounter: Payer: Self-pay | Admitting: Hematology and Oncology

## 2014-03-11 ENCOUNTER — Telehealth: Payer: Self-pay | Admitting: Hematology and Oncology

## 2014-03-11 ENCOUNTER — Ambulatory Visit (HOSPITAL_BASED_OUTPATIENT_CLINIC_OR_DEPARTMENT_OTHER): Payer: Medicare Other | Admitting: Hematology and Oncology

## 2014-03-11 VITALS — BP 151/64 | HR 62 | Temp 98.3°F | Resp 18 | Ht 70.0 in | Wt 281.7 lb

## 2014-03-11 DIAGNOSIS — R7689 Other specified abnormal immunological findings in serum: Secondary | ICD-10-CM

## 2014-03-11 DIAGNOSIS — E559 Vitamin D deficiency, unspecified: Secondary | ICD-10-CM

## 2014-03-11 DIAGNOSIS — R768 Other specified abnormal immunological findings in serum: Secondary | ICD-10-CM

## 2014-03-11 MED ORDER — VITAMIN D3 125 MCG (5000 UT) PO CAPS
5000.0000 [IU] | ORAL_CAPSULE | Freq: Every day | ORAL | Status: DC
Start: 2014-03-11 — End: 2014-06-18

## 2014-03-11 NOTE — Telephone Encounter (Signed)
[id number] x1 

## 2014-03-11 NOTE — Progress Notes (Signed)
[place] OFFICE PROGRESS NOTE  Adam Stain, MD SUMMARY OF HEMATOLOGIC HISTORY: [name] 68 y.o. male is here because of recent abnormal evaluation by his rheumatologist. He has chronic arthritis pain, diffuse in nature of which he takes regular nonsteroidal anti-inflammatory medications. He denies history of abnormal bone pain or bone fracture. Patient denies history of recurrent infection or atypical infections such as shingles of meningitis. Denies chills, night sweats, anorexia or abnormal weight loss. He was seen in December 2015 and workup excluded monoclonal paraproteinemia. He was found to have severe vitamin D deficiency INTERVAL HISTORY: [name] 68 y.o. male returns for follow-up. He denies new symptoms.  I have reviewed the past medical history, past surgical history, social history and family history with the patient and they are unchanged from previous note.  ALLERGIES:  is allergic to cheese; zithromax; and amlodipine.  MEDICATIONS:  Current Outpatient Prescriptions  Medication Sig Dispense Refill  . amiodarone (PACERONE) 200 MG tablet Take 1 tablet (200 mg total) by mouth daily. 30 tablet 5  . apixaban (ELIQUIS) 5 MG TABS tablet Take 1 tablet (5 mg total) by mouth 2 (two) times daily. 60 tablet 0  . FLUoxetine (PROZAC) 20 MG tablet Take 20 mg by mouth every morning.     . fluticasone (FLONASE) 50 MCG/ACT nasal spray Place 2 sprays into both nostrils as needed.    . furosemide (LASIX) 40 MG tablet Take 1 tablet (40 mg total) by mouth every morning. 90 tablet 3  . ibuprofen (ADVIL,MOTRIN) 200 MG tablet Take 400-800 mg by mouth every 6 (six) hours as needed for mild pain or moderate pain.    Marland Kitchen lisinopril (PRINIVIL,ZESTRIL) 2.5 MG tablet Take 1 tablet (2.5 mg total) by mouth daily. 90 tablet 3  . Menthol-Zinc Oxide (GOLD BOND EX) Apply topically daily.    . Multiple Vitamins-Minerals (PRESERVISION AREDS 2) CAPS Take 1 capsule by mouth 2 (two) times  daily.     Marland Kitchen rOPINIRole (REQUIP) 0.5 MG tablet Take 2 tablets each night after dinner 60 tablet 6  . sulfaSALAzine (AZULFIDINE) 500 MG EC tablet Take 1,000 mg by mouth 2 (two) times daily.    . tamsulosin (FLOMAX) 0.4 MG CAPS capsule Take 0.4 mg by mouth daily.    . Cholecalciferol (VITAMIN D3) 5000 UNITS CAPS Take 5,000 Units by mouth daily. 90 capsule 0  . [DISCONTINUED] loratadine (CLARITIN) 10 MG tablet Take 10 mg by mouth daily.       No current facility-administered medications for this visit.     REVIEW OF SYSTEMS:   All other systems were reviewed with the patient and are negative.  PHYSICAL EXAMINATION: ECOG PERFORMANCE STATUS: 1 - Symptomatic but completely ambulatory  Filed Vitals:   03/11/14 1023  BP: 151/64  Pulse: 62  Temp: 98.3 F (36.8 C)  Resp: 18   Filed Weights   03/11/14 1023  Weight: 281 lb 11.2 oz (127.778 kg)    GENERAL:alert, no distress and comfortable. He is morbidly obese SKIN: skin color, texture, turgor are normal, no rashes or significant lesions EYES: normal, Conjunctiva are pink and non-injected, sclera clear Musculoskeletal:no cyanosis of digits and no clubbing  NEURO: alert & oriented x 3 with fluent speech, no focal motor/sensory deficits  LABORATORY DATA:  I have reviewed the data as listed No results found for this or any previous visit (from the past 48 hour(s)).  Lab Results  Component Value Date   WBC 8.1 02/26/2014   HGB 12.3* 02/26/2014  HCT 38.4 02/26/2014   MCV 90.6 02/26/2014   PLT 267 02/26/2014    RADIOGRAPHIC STUDIES: Skeletal x-ray is negative for lytic lesions I have personally reviewed the radiological images as listed and agreed with the findings in the report.  ASSESSMENT & PLAN:  Raised level of immunoglobulins The pattern is polyclonal, likely reactive to some form of autoimmune arthritis. I will recheck it again next year to make sure that we are not missing a small clone. I reassured the patient.  Vitamin  D deficiency His severe vitamin D deficiency is likely a contributing factor to his joint pain and back pain. I recommend high-dose vitamin D replacement therapy and we shall recheck it next year.   All questions were answered. The patient knows to call the clinic with any problems, questions or concerns. No barriers to learning was detected.  I spent 15 minutes counseling the patient face to face. The total time spent in the appointment was 20 minutes and more than 50% was on counseling.     [facility], [place], MD 03/11/2014 9:04 PM

## 2014-03-11 NOTE — Assessment & Plan Note (Signed)
His severe vitamin D deficiency is likely a contributing factor to his joint pain and back pain. I recommend high-dose vitamin D replacement therapy and we shall recheck it next year.

## 2014-03-11 NOTE — Telephone Encounter (Signed)
gv adn printed appt sched and avs for pt for June °

## 2014-03-11 NOTE — Assessment & Plan Note (Signed)
The pattern is polyclonal, likely reactive to some form of autoimmune arthritis. I will recheck it again next year to make sure that we are not missing a small clone. I reassured the patient.

## 2014-03-12 NOTE — Telephone Encounter (Signed)
[id number] for [name] at [facility]. Watson Bing, CMA

## 2014-03-12 NOTE — Telephone Encounter (Signed)
I spoke with Nurse Vivien Rota from [facility] and was advised that they need an OV note stating the pt benefits from the bipap or the claim will be denied and they pt will have to pay out of the pocket for the machine. I called the pt to get his permission to send this info to the insurance. [name] gave me the verbal ok to send this information.  I faxed the OV note to [phone] to attention Vivien Rota. Nothing further needed. [name], CMA

## 2014-03-31 ENCOUNTER — Ambulatory Visit: Payer: Medicare Other | Admitting: Pulmonary Disease

## 2014-04-05 DIAGNOSIS — G4733 Obstructive sleep apnea (adult) (pediatric): Secondary | ICD-10-CM | POA: Diagnosis not present

## 2014-04-07 ENCOUNTER — Encounter: Payer: Self-pay | Admitting: Pulmonary Disease

## 2014-04-07 ENCOUNTER — Ambulatory Visit (INDEPENDENT_AMBULATORY_CARE_PROVIDER_SITE_OTHER): Payer: Medicare Other | Admitting: Pulmonary Disease

## 2014-04-07 VITALS — BP 132/74 | HR 63 | Temp 97.1°F | Ht 70.0 in | Wt 290.4 lb

## 2014-04-07 DIAGNOSIS — G2581 Restless legs syndrome: Secondary | ICD-10-CM

## 2014-04-07 DIAGNOSIS — G4733 Obstructive sleep apnea (adult) (pediatric): Secondary | ICD-10-CM

## 2014-04-07 NOTE — Progress Notes (Signed)
   Subjective:    Patient ID: [name], male    DOB: Jan 06, 1946, 69 y.o.   MRN: [id number]  HPI Patient comes in today for follow-up of his obstructive sleep apnea. He was also started on a dopamine agonist at the last visit for large numbers of periodic limb movements. He feels this has definitely helped his kicking during the night in his sleep. He is also been very compliant on his bilevel, and his download shows great control of his AHI and no significant mask leak. The patient's weight is been stable since the last visit.   Review of Systems  Constitutional: Negative for fever and unexpected weight change.  HENT: Negative for congestion, dental problem, ear pain, nosebleeds, postnasal drip, rhinorrhea, sinus pressure, sneezing, sore throat and trouble swallowing.   Eyes: Negative for redness and itching.  Respiratory: Negative for cough, chest tightness, shortness of breath and wheezing.   Cardiovascular: Negative for palpitations and leg swelling.  Gastrointestinal: Negative for nausea and vomiting.  Genitourinary: Negative for dysuria.  Musculoskeletal: Negative for joint swelling.  Skin: Negative for rash.  Neurological: Negative for headaches.  Hematological: Does not bruise/bleed easily.  Psychiatric/Behavioral: Negative for dysphoric mood. The patient is not nervous/anxious.        Objective:   Physical Exam  Obese male in no acute distress Nose without purulence or discharge noted Neck without lymphadenopathy or thyromegaly No skin breakdown or pressure necrosis from the C Pap mask Lower extremities with mild edema noted, no cyanosis Alert and oriented, does not appear to be sleepy, moves all 4 extremities.      Assessment & Plan:

## 2014-04-07 NOTE — Assessment & Plan Note (Signed)
The patient is doing extremely well on his bilevel, and his download shows great control of his AHI and great compliance. The patient feels that he is definitely sleeping better, and has seen improvement in his daytime alertness. I have encouraged him to work aggressively on weight loss.

## 2014-04-07 NOTE — Assessment & Plan Note (Signed)
He feels the Requip has clearly helped his leg kicking during the night, and would like to stay on this medication.

## 2014-04-07 NOTE — Patient Instructions (Signed)
Stay on bipap.  Your download looks great. Continue on requip, and will send in 90 day supply with refills for a year. Work on weight reduction followup with me again in one year.

## 2014-04-08 DIAGNOSIS — M170 Bilateral primary osteoarthritis of knee: Secondary | ICD-10-CM | POA: Diagnosis not present

## 2014-04-11 ENCOUNTER — Other Ambulatory Visit: Payer: Self-pay | Admitting: Internal Medicine

## 2014-04-15 DIAGNOSIS — M170 Bilateral primary osteoarthritis of knee: Secondary | ICD-10-CM | POA: Diagnosis not present

## 2014-04-16 ENCOUNTER — Ambulatory Visit (INDEPENDENT_AMBULATORY_CARE_PROVIDER_SITE_OTHER): Payer: Medicare Other

## 2014-04-16 DIAGNOSIS — Z23 Encounter for immunization: Secondary | ICD-10-CM

## 2014-04-22 DIAGNOSIS — M170 Bilateral primary osteoarthritis of knee: Secondary | ICD-10-CM | POA: Diagnosis not present

## 2014-04-26 DIAGNOSIS — G4733 Obstructive sleep apnea (adult) (pediatric): Secondary | ICD-10-CM | POA: Diagnosis not present

## 2014-04-29 DIAGNOSIS — M170 Bilateral primary osteoarthritis of knee: Secondary | ICD-10-CM | POA: Diagnosis not present

## 2014-05-06 DIAGNOSIS — M170 Bilateral primary osteoarthritis of knee: Secondary | ICD-10-CM | POA: Diagnosis not present

## 2014-05-18 ENCOUNTER — Telehealth: Payer: Self-pay

## 2014-05-18 NOTE — Telephone Encounter (Signed)
Prior Authorization approved for Eliquis 5mg  60 capsules for a 30 day supply.  Good through 05/18/2015; Conf. # [id number].

## 2014-05-20 ENCOUNTER — Telehealth: Payer: Self-pay

## 2014-05-20 DIAGNOSIS — E559 Vitamin D deficiency, unspecified: Secondary | ICD-10-CM | POA: Diagnosis not present

## 2014-05-20 DIAGNOSIS — M170 Bilateral primary osteoarthritis of knee: Secondary | ICD-10-CM | POA: Diagnosis not present

## 2014-05-20 DIAGNOSIS — L4050 Arthropathic psoriasis, unspecified: Secondary | ICD-10-CM | POA: Diagnosis not present

## 2014-05-20 DIAGNOSIS — R899 Unspecified abnormal finding in specimens from other organs, systems and tissues: Secondary | ICD-10-CM | POA: Diagnosis not present

## 2014-05-20 NOTE — Telephone Encounter (Signed)
Pt's wife,Linda left v/m; pt saw Dr Estanislado Pandy earlier today and wants pt to have tb skin test done at PCP office. Dr Estanislado Pandy office did not have PPD in their office. 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 Vaughan Basta does not know the name of med. Is it OK to schedule nurse visit? Dr [name] last saw pt 01/22/14.

## 2014-05-21 NOTE — Telephone Encounter (Signed)
Appointment scheduled for RN visit for PPD placement.

## 2014-05-21 NOTE — Telephone Encounter (Signed)
Please schedule RN visit for any day other than Thursdays for a PPD placement, with reading 2-3 days later.  Thanks.

## 2014-05-25 ENCOUNTER — Ambulatory Visit (INDEPENDENT_AMBULATORY_CARE_PROVIDER_SITE_OTHER): Payer: Medicare Other | Admitting: *Deleted

## 2014-05-25 DIAGNOSIS — Z111 Encounter for screening for respiratory tuberculosis: Secondary | ICD-10-CM | POA: Diagnosis not present

## 2014-05-27 DIAGNOSIS — G4733 Obstructive sleep apnea (adult) (pediatric): Secondary | ICD-10-CM | POA: Diagnosis not present

## 2014-05-28 ENCOUNTER — Encounter: Payer: Self-pay | Admitting: *Deleted

## 2014-05-28 LAB — TB SKIN TEST: TB SKIN TEST: NEGATIVE

## 2014-06-17 DIAGNOSIS — M722 Plantar fascial fibromatosis: Secondary | ICD-10-CM | POA: Diagnosis not present

## 2014-06-17 DIAGNOSIS — Z79899 Other long term (current) drug therapy: Secondary | ICD-10-CM | POA: Diagnosis not present

## 2014-06-17 DIAGNOSIS — M19041 Primary osteoarthritis, right hand: Secondary | ICD-10-CM | POA: Diagnosis not present

## 2014-06-17 DIAGNOSIS — E559 Vitamin D deficiency, unspecified: Secondary | ICD-10-CM | POA: Diagnosis not present

## 2014-06-17 DIAGNOSIS — L4050 Arthropathic psoriasis, unspecified: Secondary | ICD-10-CM | POA: Diagnosis not present

## 2014-06-18 ENCOUNTER — Emergency Department (HOSPITAL_COMMUNITY): Payer: Medicare Other

## 2014-06-18 ENCOUNTER — Emergency Department (HOSPITAL_COMMUNITY): Payer: Medicare Other | Admitting: Anesthesiology

## 2014-06-18 ENCOUNTER — Other Ambulatory Visit (HOSPITAL_COMMUNITY): Payer: Self-pay

## 2014-06-18 ENCOUNTER — Emergency Department (HOSPITAL_COMMUNITY)
Admission: EM | Admit: 2014-06-18 | Discharge: 2014-06-18 | Disposition: A | Discharge status: Home / Self Care | Payer: Medicare Other | Attending: Emergency Medicine | Admitting: Emergency Medicine

## 2014-06-18 ENCOUNTER — Encounter (HOSPITAL_COMMUNITY)
Admission: EM | Disposition: A | Discharge status: Home / Self Care | Payer: Self-pay | Source: Home / Self Care | Attending: Emergency Medicine

## 2014-06-18 ENCOUNTER — Encounter (HOSPITAL_COMMUNITY): Payer: Self-pay | Admitting: Family Medicine

## 2014-06-18 DIAGNOSIS — Z87442 Personal history of urinary calculi: Secondary | ICD-10-CM | POA: Diagnosis not present

## 2014-06-18 DIAGNOSIS — I10 Essential (primary) hypertension: Secondary | ICD-10-CM | POA: Diagnosis not present

## 2014-06-18 DIAGNOSIS — N201 Calculus of ureter: Secondary | ICD-10-CM | POA: Diagnosis not present

## 2014-06-18 DIAGNOSIS — E119 Type 2 diabetes mellitus without complications: Secondary | ICD-10-CM | POA: Diagnosis not present

## 2014-06-18 DIAGNOSIS — Z888 Allergy status to other drugs, medicaments and biological substances status: Secondary | ICD-10-CM | POA: Diagnosis not present

## 2014-06-18 DIAGNOSIS — G4730 Sleep apnea, unspecified: Secondary | ICD-10-CM | POA: Insufficient documentation

## 2014-06-18 DIAGNOSIS — Z881 Allergy status to other antibiotic agents status: Secondary | ICD-10-CM | POA: Insufficient documentation

## 2014-06-18 DIAGNOSIS — I2510 Atherosclerotic heart disease of native coronary artery without angina pectoris: Secondary | ICD-10-CM | POA: Diagnosis not present

## 2014-06-18 DIAGNOSIS — F4310 Post-traumatic stress disorder, unspecified: Secondary | ICD-10-CM | POA: Insufficient documentation

## 2014-06-18 DIAGNOSIS — I4891 Unspecified atrial fibrillation: Secondary | ICD-10-CM | POA: Insufficient documentation

## 2014-06-18 DIAGNOSIS — Z87891 Personal history of nicotine dependence: Secondary | ICD-10-CM | POA: Insufficient documentation

## 2014-06-18 DIAGNOSIS — R1011 Right upper quadrant pain: Secondary | ICD-10-CM

## 2014-06-18 DIAGNOSIS — N211 Calculus in urethra: Secondary | ICD-10-CM | POA: Diagnosis not present

## 2014-06-18 DIAGNOSIS — E559 Vitamin D deficiency, unspecified: Secondary | ICD-10-CM | POA: Diagnosis not present

## 2014-06-18 DIAGNOSIS — R109 Unspecified abdominal pain: Secondary | ICD-10-CM

## 2014-06-18 DIAGNOSIS — K5730 Diverticulosis of large intestine without perforation or abscess without bleeding: Secondary | ICD-10-CM | POA: Diagnosis not present

## 2014-06-18 DIAGNOSIS — Z91018 Allergy to other foods: Secondary | ICD-10-CM | POA: Diagnosis not present

## 2014-06-18 DIAGNOSIS — K219 Gastro-esophageal reflux disease without esophagitis: Secondary | ICD-10-CM | POA: Diagnosis not present

## 2014-06-18 DIAGNOSIS — M1990 Unspecified osteoarthritis, unspecified site: Secondary | ICD-10-CM | POA: Diagnosis not present

## 2014-06-18 DIAGNOSIS — N1330 Unspecified hydronephrosis: Secondary | ICD-10-CM | POA: Diagnosis not present

## 2014-06-18 DIAGNOSIS — I5030 Unspecified diastolic (congestive) heart failure: Secondary | ICD-10-CM | POA: Insufficient documentation

## 2014-06-18 DIAGNOSIS — N200 Calculus of kidney: Secondary | ICD-10-CM | POA: Diagnosis not present

## 2014-06-18 DIAGNOSIS — R112 Nausea with vomiting, unspecified: Secondary | ICD-10-CM | POA: Diagnosis not present

## 2014-06-18 DIAGNOSIS — N132 Hydronephrosis with renal and ureteral calculous obstruction: Secondary | ICD-10-CM | POA: Diagnosis not present

## 2014-06-18 DIAGNOSIS — N202 Calculus of kidney with calculus of ureter: Secondary | ICD-10-CM | POA: Diagnosis not present

## 2014-06-18 DIAGNOSIS — K760 Fatty (change of) liver, not elsewhere classified: Secondary | ICD-10-CM | POA: Diagnosis not present

## 2014-06-18 HISTORY — PX: CYSTOSCOPY WITH STENT PLACEMENT: SHX5790

## 2014-06-18 LAB — COMPREHENSIVE METABOLIC PANEL
ALK PHOS: 112 U/L (ref 39–117)
ALT: 17 U/L (ref 0–53)
AST: 21 U/L (ref 0–37)
Albumin: 3.9 g/dL (ref 3.5–5.2)
Anion gap: 9 (ref 5–15)
BUN: 21 mg/dL (ref 6–23)
CO2: 25 mmol/L (ref 19–32)
Calcium: 9.7 mg/dL (ref 8.4–10.5)
Chloride: 105 mmol/L (ref 96–112)
Creatinine, Ser: 1.28 mg/dL (ref 0.50–1.35)
GFR, EST AFRICAN AMERICAN: 65 mL/min — AB (ref >90)
GFR, EST NON AFRICAN AMERICAN: 56 mL/min — AB (ref >90)
GLUCOSE: 198 mg/dL — AB (ref 70–99)
POTASSIUM: 3.6 mmol/L (ref 3.5–5.1)
SODIUM: 139 mmol/L (ref 135–145)
Total Bilirubin: 1.5 mg/dL — ABNORMAL HIGH (ref 0.3–1.2)
Total Protein: 7.8 g/dL (ref 6.0–8.3)

## 2014-06-18 LAB — CBC WITH DIFFERENTIAL/PLATELET
Basophils Absolute: 0 10*3/uL (ref 0.0–0.1)
Basophils Relative: 0 % (ref 0–1)
Eosinophils Absolute: 0 10*3/uL (ref 0.0–0.7)
Eosinophils Relative: 0 % (ref 0–5)
HCT: 36.8 % — ABNORMAL LOW (ref 39.0–52.0)
Hemoglobin: 13 g/dL (ref 13.0–17.0)
LYMPHS ABS: 0.9 10*3/uL (ref 0.7–4.0)
LYMPHS PCT: 7 % — AB (ref 12–46)
MCH: 30.9 pg (ref 26.0–34.0)
MCHC: 35.3 g/dL (ref 30.0–36.0)
MCV: 87.4 fL (ref 78.0–100.0)
Monocytes Absolute: 0.5 10*3/uL (ref 0.1–1.0)
Monocytes Relative: 4 % (ref 3–12)
NEUTROS ABS: 10.8 10*3/uL — AB (ref 1.7–7.7)
NEUTROS PCT: 89 % — AB (ref 43–77)
PLATELETS: 240 10*3/uL (ref 150–400)
RBC: 4.21 MIL/uL — AB (ref 4.22–5.81)
RDW: 13.4 % (ref 11.5–15.5)
WBC: 12.1 10*3/uL — ABNORMAL HIGH (ref 4.0–10.5)

## 2014-06-18 LAB — URINALYSIS, ROUTINE W REFLEX MICROSCOPIC
Bilirubin Urine: NEGATIVE
Glucose, UA: NEGATIVE mg/dL
Ketones, ur: NEGATIVE mg/dL
LEUKOCYTES UA: NEGATIVE
NITRITE: NEGATIVE
PH: 6.5 (ref 5.0–8.0)
Protein, ur: NEGATIVE mg/dL
SPECIFIC GRAVITY, URINE: 1.021 (ref 1.005–1.030)
UROBILINOGEN UA: 1 mg/dL (ref 0.0–1.0)

## 2014-06-18 LAB — URINE MICROSCOPIC-ADD ON

## 2014-06-18 LAB — LIPASE, BLOOD: LIPASE: 28 U/L (ref 11–59)

## 2014-06-18 LAB — GLUCOSE, CAPILLARY: Glucose-Capillary: 112 mg/dL — ABNORMAL HIGH (ref 70–99)

## 2014-06-18 SURGERY — CYSTOSCOPY, WITH STENT INSERTION
Anesthesia: General | Site: Ureter | Laterality: Right

## 2014-06-18 MED ORDER — HYDROCODONE-ACETAMINOPHEN 5-325 MG PO TABS: 1.0000 | ORAL_TABLET | Freq: Four times a day (QID) | ORAL | Status: DC | PRN

## 2014-06-18 MED ORDER — PROMETHAZINE HCL 25 MG/ML IJ SOLN
12.5000 mg | Freq: Once | INTRAMUSCULAR | Status: AC
  Administered 2014-06-18: 12.5 mg via INTRAVENOUS
  Filled 2014-06-18: qty 1

## 2014-06-18 MED ORDER — FENTANYL CITRATE 0.05 MG/ML IJ SOLN
INTRAMUSCULAR | Status: AC
  Filled 2014-06-18: qty 2

## 2014-06-18 MED ORDER — ONDANSETRON HCL 4 MG/2ML IJ SOLN
INTRAMUSCULAR | Status: DC | PRN
  Administered 2014-06-18: 4 mg via INTRAVENOUS

## 2014-06-18 MED ORDER — FENTANYL CITRATE 0.05 MG/ML IJ SOLN
INTRAMUSCULAR | Status: DC | PRN
  Administered 2014-06-18: 50 ug via INTRAVENOUS

## 2014-06-18 MED ORDER — LACTATED RINGERS IV SOLN
INTRAVENOUS | Status: DC
  Administered 2014-06-18: 18:00:00 via INTRAVENOUS
  Administered 2014-06-18: 1000 mL via INTRAVENOUS

## 2014-06-18 MED ORDER — SODIUM CHLORIDE 0.9 % IR SOLN
Status: DC | PRN
  Administered 2014-06-18: 3000 mL

## 2014-06-18 MED ORDER — 0.9 % SODIUM CHLORIDE (POUR BTL) OPTIME
TOPICAL | Status: DC | PRN
  Administered 2014-06-18: 1000 mL

## 2014-06-18 MED ORDER — LIDOCAINE HCL 2 % EX GEL
CUTANEOUS | Status: AC
  Filled 2014-06-18: qty 10

## 2014-06-18 MED ORDER — PROPOFOL 10 MG/ML IV BOLUS
INTRAVENOUS | Status: DC | PRN
  Administered 2014-06-18: 150 mg via INTRAVENOUS

## 2014-06-18 MED ORDER — ONDANSETRON HCL 4 MG/2ML IJ SOLN
4.0000 mg | Freq: Once | INTRAMUSCULAR | Status: AC
  Administered 2014-06-18: 4 mg via INTRAVENOUS
  Filled 2014-06-18: qty 2

## 2014-06-18 MED ORDER — CEFAZOLIN SODIUM-DEXTROSE 2-3 GM-% IV SOLR
INTRAVENOUS | Status: AC
  Filled 2014-06-18: qty 50

## 2014-06-18 MED ORDER — IOHEXOL 300 MG/ML  SOLN
INTRAMUSCULAR | Status: DC | PRN
  Administered 2014-06-18: 10 mL via URETHRAL

## 2014-06-18 MED ORDER — PROPOFOL 10 MG/ML IV BOLUS
INTRAVENOUS | Status: AC
  Filled 2014-06-18: qty 20

## 2014-06-18 MED ORDER — FENTANYL CITRATE 0.05 MG/ML IJ SOLN: 25.0000 ug | INTRAMUSCULAR | Status: DC | PRN

## 2014-06-18 MED ORDER — MIDAZOLAM HCL 5 MG/5ML IJ SOLN
INTRAMUSCULAR | Status: DC | PRN
  Administered 2014-06-18: 2 mg via INTRAVENOUS

## 2014-06-18 MED ORDER — MEPERIDINE HCL 50 MG/ML IJ SOLN: 6.2500 mg | INTRAMUSCULAR | Status: DC | PRN

## 2014-06-18 MED ORDER — ONDANSETRON HCL 4 MG/2ML IJ SOLN: 4.0000 mg | Freq: Once | INTRAMUSCULAR | Status: DC | PRN

## 2014-06-18 MED ORDER — CEFAZOLIN SODIUM-DEXTROSE 2-3 GM-% IV SOLR
2.0000 g | Freq: Once | INTRAVENOUS | Status: AC
  Administered 2014-06-18: 2 g via INTRAVENOUS

## 2014-06-18 MED ORDER — ONDANSETRON 4 MG PO TBDP: 8.0000 mg | ORAL_TABLET | Freq: Once | ORAL | Status: DC

## 2014-06-18 MED ORDER — HYDROMORPHONE HCL 1 MG/ML IJ SOLN
1.0000 mg | Freq: Once | INTRAMUSCULAR | Status: AC
  Administered 2014-06-18: 1 mg via INTRAVENOUS
  Filled 2014-06-18: qty 1

## 2014-06-18 MED ORDER — ONDANSETRON HCL 4 MG/2ML IJ SOLN
INTRAMUSCULAR | Status: AC
  Filled 2014-06-18: qty 2

## 2014-06-18 MED ORDER — FENTANYL CITRATE 0.05 MG/ML IJ SOLN
100.0000 ug | Freq: Once | INTRAMUSCULAR | Status: AC
  Administered 2014-06-18: 100 ug via INTRAVENOUS
  Filled 2014-06-18: qty 2

## 2014-06-18 MED ORDER — SODIUM CHLORIDE 0.9 % IV BOLUS (SEPSIS)
500.0000 mL | Freq: Once | INTRAVENOUS | Status: AC
  Administered 2014-06-18: 500 mL via INTRAVENOUS

## 2014-06-18 MED ORDER — MIDAZOLAM HCL 2 MG/2ML IJ SOLN
INTRAMUSCULAR | Status: AC
  Filled 2014-06-18: qty 2

## 2014-06-18 SURGICAL SUPPLY — 23 items
BAG URO CATCHER STRL LF (DRAPE) ×2 IMPLANT
BASKET ZERO TIP NITINOL 2.4FR (BASKET) IMPLANT
CATH INTERMIT  6FR 70CM (CATHETERS) ×2 IMPLANT
CLOTH BEACON ORANGE TIMEOUT ST (SAFETY) ×2 IMPLANT
FIBER LASER FLEXIVA 1000 (UROLOGICAL SUPPLIES) IMPLANT
FIBER LASER FLEXIVA 200 (UROLOGICAL SUPPLIES) IMPLANT
FIBER LASER FLEXIVA 365 (UROLOGICAL SUPPLIES) IMPLANT
FIBER LASER FLEXIVA 550 (UROLOGICAL SUPPLIES) IMPLANT
FIBER LASER TRAC TIP (UROLOGICAL SUPPLIES) IMPLANT
GLOVE BIOGEL M STRL SZ7.5 (GLOVE) ×2 IMPLANT
GLOVE BIOGEL PI IND STRL 7.5 (GLOVE) ×2 IMPLANT
GLOVE BIOGEL PI INDICATOR 7.5 (GLOVE) ×2
GLOVE SURG SS PI 7.5 STRL IVOR (GLOVE) ×6 IMPLANT
GOWN STRL REUS W/TWL LRG LVL3 (GOWN DISPOSABLE) ×4 IMPLANT
GUIDEWIRE ANG ZIPWIRE 038X150 (WIRE) IMPLANT
GUIDEWIRE STR DUAL SENSOR (WIRE) ×2 IMPLANT
IV NS IRRIG 3000ML ARTHROMATIC (IV SOLUTION) ×2 IMPLANT
MANIFOLD NEPTUNE II (INSTRUMENTS) ×2 IMPLANT
NS IRRIG 1000ML POUR BTL (IV SOLUTION) ×2 IMPLANT
PACK CYSTO (CUSTOM PROCEDURE TRAY) ×2 IMPLANT
SHIELD EYE BINOCULAR (MISCELLANEOUS) IMPLANT
STENT CONTOUR 6FRX24X.038 (STENTS) ×2 IMPLANT
TUBING CONNECTING 10 (TUBING) ×2 IMPLANT

## 2014-06-18 NOTE — Discharge Instructions (Addendum)
1. You may see some blood in the urine and may have some burning with urination for 48-72 hours. You also may notice that you have to urinate more frequently or urgently after your procedure which is normal.  2. You should call should you develop an inability urinate, fever > 101, persistent nausea and vomiting that prevents you from eating or drinking to stay hydrated.  3. If you have a stent, you will likely urinate more frequently and urgently until the stent is removed and you may experience some discomfort/pain in the lower abdomen and flank especially when urinating. You may take pain medication prescribed to you if needed for pain. You may also intermittently have blood in the urine until the stent is removed.  Kidney Stones Kidney stones (urolithiasis) are deposits that form inside your kidneys. The intense pain is caused by the stone moving through the urinary tract. When the stone moves, the ureter goes into spasm around the stone. The stone is usually passed in the urine.  CAUSES   A disorder that makes certain neck glands produce too much parathyroid hormone (primary hyperparathyroidism).  A buildup of uric acid crystals, similar to gout in your joints.  Narrowing (stricture) of the ureter.  A kidney obstruction present at birth (congenital obstruction).  Previous surgery on the kidney or ureters.  Numerous kidney infections. SYMPTOMS   Feeling sick to your stomach (nauseous).  Throwing up (vomiting).  Blood in the urine (hematuria).  Pain that usually spreads (radiates) to the groin.  Frequency or urgency of urination. DIAGNOSIS   Taking a history and physical exam.  Blood or urine tests.  CT scan.  Occasionally, an examination of the inside of the urinary bladder (cystoscopy) is performed. TREATMENT   Observation.  Increasing your fluid intake.  Extracorporeal shock wave lithotripsy--This is a noninvasive procedure that uses shock waves to break up kidney  stones.  Surgery may be needed if you have severe pain or persistent obstruction. There are various surgical procedures. Most of the procedures are performed with the use of small instruments. Only small incisions are needed to accommodate these instruments, so recovery time is minimized. The size, location, and chemical composition are all important variables that will determine the proper choice of action for you. Talk to your health care provider to better understand your situation so that you will minimize the risk of injury to yourself and your kidney.  HOME CARE INSTRUCTIONS   Drink enough water and fluids to keep your urine clear or pale yellow. This will help you to pass the stone or stone fragments.  Strain all urine through the provided strainer. Keep all particulate matter and stones for your health care provider to see. The stone causing the pain may be as small as a grain of salt. It is very important to use the strainer each and every time you pass your urine. The collection of your stone will allow your health care provider to analyze it and verify that a stone has actually passed. The stone analysis will often identify what you can do to reduce the incidence of recurrences.  Only take over-the-counter or prescription medicines for pain, discomfort, or fever as directed by your health care provider.  Make a follow-up appointment with your health care provider as directed.  Get follow-up X-rays if required. The absence of pain does not always mean that the stone has passed. It may have only stopped moving. If the urine remains completely obstructed, it can cause loss of kidney  function or even complete destruction of the kidney. It is your responsibility to make sure X-rays and follow-ups are completed. Ultrasounds of the kidney can show blockages and the status of the kidney. Ultrasounds are not associated with any radiation and can be performed easily in a matter of minutes. SEEK  MEDICAL CARE IF:  You experience pain that is progressive and unresponsive to any pain medicine you have been prescribed. SEEK IMMEDIATE MEDICAL CARE IF:   Pain cannot be controlled with the prescribed medicine.  You have a fever or shaking chills.  The severity or intensity of pain increases over 18 hours and is not relieved by pain medicine.  You develop a new onset of abdominal pain.  You feel faint or pass out.  You are unable to urinate. MAKE SURE YOU:   Understand these instructions.  Will watch your condition.  Will get help right away if you are not doing well or get worse. Document Released: 03/19/2005 Document Revised: 11/19/2012 Document Reviewed: 08/20/2012 [facility] Patient Information 2015 [place], [place]. This information is not intended to replace advice given to you by your health care provider. Make sure you discuss any questions you have with your health care provider.

## 2014-06-18 NOTE — ED Notes (Signed)
Pt here for right mid abd pain radiating into his back. sts woke up with this pain and also some nausea, vomiting.

## 2014-06-18 NOTE — Anesthesia Procedure Notes (Signed)
Procedure Name: LMA Insertion Date/Time: 06/18/2014 5:38 PM Performed by: Johnathan Hausen A Pre-anesthesia Checklist: Patient identified, Emergency Drugs available, Suction available, Patient being monitored and Timeout performed Patient Re-evaluated:Patient Re-evaluated prior to inductionOxygen Delivery Method: Circle system utilized Preoxygenation: Pre-oxygenation with 100% oxygen Intubation Type: Combination inhalational/ intravenous induction Ventilation: Mask ventilation without difficulty LMA: LMA with gastric port inserted LMA Size: 5.0 Tube type: Oral Number of attempts: 1 Placement Confirmation: positive ETCO2 and breath sounds checked- equal and bilateral Tube secured with: Tape

## 2014-06-18 NOTE — Anesthesia Postprocedure Evaluation (Signed)
Anesthesia Post Note  Patient: [name]  Procedure(s) Performed: Procedure(s) (LRB): CYSTOSCOPY WITH  RIGHT RETROGRADE PYELOGRAM /RIGHTSTENT PLACEMENT  (Right)  Anesthesia type: General  Patient location: PACU  Post pain: Pain level controlled  Post assessment: Post-op Vital signs reviewed  Last Vitals:  Filed Vitals:   06/18/14 1808  BP: 134/65  Pulse:   Temp: 37 C  Resp: 18    Post vital signs: Reviewed  Level of consciousness: sedated  Complications: No apparent anesthesia complications

## 2014-06-18 NOTE — Progress Notes (Signed)
PACU Nsg Note: Discharge Note: Pt fully alert and oriented x 4, mae x 4, pt able to stand and ambulate w/o assistance, gait very steady, VSS, pt denies any pain or discomfort, pt tolerating PO fluids well and was able to void into urinal w/o difficulty or any complaints. DC instructions reviewed w/ pt and wife. Pt teaching done re: (1) pain med, indications and safety of pain med (2) post diet per Anesthesia guidelines, (3) activity for next 24 hours secondary to rec general anesthesia, (4) importance of calling and keeping post op MD appoint as instructed by primary MD. (5) pt teaching done re: importance of taking and increasing PO fluids (6) when to call MD, i.e unable to void, uncontrolled pain, s&s of infection, etc (7) importance of hand washing. All instructions provided to wife and pt, via teach back method, explaination and many opportunities for questions provided. Pt escorted w/ wife to exit, pt in wc, pt assisted into vehicle as needed. Prior to leaving again asked both pt and wife if any questions or concerns.   Stormy Card, BSN, RN, RRT, CPAN, CCRN

## 2014-06-18 NOTE — ED Notes (Signed)
Patient transported to Ultrasound 
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
## 2014-06-18 NOTE — Op Note (Addendum)
Preoperative diagnosis:  1. Right ureteral stone   Postoperative diagnosis:  1. Right ureteral stone   Procedure:  1. Cystoscopy 2. Right ureteral stent placement (6 x 24) no string 3. Right retrograde pyelography with interpretation   Surgeon: Pryor Curia. M.D.  Anesthesia: General  Complications: None  Intraoperative findings: Right RPG demonstrated a normal caliber ureter without filling defects distally with a large proximal ureteral filling defect consistent with his known stone.  There was hydronephrosis of the right renal collecting system.  The stone was not radioopaque.  EBL: Minimal  Specimens: None  Indication: [name] is a 69 y.o. patient with a proximal right ureteral stone measuring 1.3 cm and uncontrolled pain. After reviewing the management options for treatment, he elected to proceed with the above surgical procedure(s). We have discussed the potential benefits and risks of the procedure, side effects of the proposed treatment, the likelihood of the patient achieving the goals of the procedure, and any potential problems that might occur during the procedure or recuperation. Informed consent has been obtained.  Description of procedure:  The patient was taken to the operating room and general anesthesia was induced.  The patient was placed in the dorsal lithotomy position, prepped and draped in the usual sterile fashion, and preoperative antibiotics were administered. A preoperative time-out was performed.   Cystourethroscopy was performed.  The patient's urethra was examined and was normal. The bladder was then systematically examined in its entirety. There was no evidence for any bladder tumors, stones, or other mucosal pathology.    Attention then turned to the right ureteral orifice and a ureteral catheter was used to intubate the ureteral orifice.  Omnipaque contrast was injected through the ureteral catheter and a retrograde pyelogram was  performed with findings as dictated above.  A 0.38 sensor guidewire was then advanced up the right ureter into the renal pelvis under fluoroscopic guidance.  The wire was then backloaded through the cystoscope and a ureteral stent was advance over the wire using Seldinger technique.  The stent was positioned appropriately under fluoroscopic and cystoscopic guidance.  The wire was then removed with an adequate stent curl noted in the renal pelvis as well as in the bladder.  The bladder was then emptied and the procedure ended.  The patient appeared to tolerate the procedure well and without complications.  The patient was able to be awakened and transferred to the recovery unit in satisfactory condition.    Pryor Curia MD

## 2014-06-18 NOTE — ED Notes (Signed)
Pts SpO2 went to 85% on room air. Pt placed on 2L [place] and SpO2 improved to 95%. NAD noted.

## 2014-06-18 NOTE — Transfer of Care (Signed)
Immediate Anesthesia Transfer of Care Note  Patient: [name]  Procedure(s) Performed: Procedure(s): CYSTOSCOPY WITH  RIGHT RETROGRADE PYELOGRAM /RIGHTSTENT PLACEMENT  (Right)  Patient Location: PACU  Anesthesia Type:General  Level of Consciousness: awake, sedated and patient cooperative  Airway & Oxygen Therapy: Patient Spontanous Breathing and Patient connected to face mask oxygen  Post-op Assessment: Report given to RN and Post -op Vital signs reviewed and stable  Post vital signs: Reviewed and stable  Last Vitals:  Filed Vitals:   06/18/14 1418  BP: 131/69  Pulse: 59  Temp: 36.7 C  Resp: 18    Complications: No apparent anesthesia complications

## 2014-06-18 NOTE — ED Notes (Addendum)
Pt transferred via [facility] to [facility], NAD at this time. Pt alert x4. VSS. All pt belongs except for his underwear sent with wife. All paperwork and consent sent with pt.
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
## 2014-06-18 NOTE — Anesthesia Preprocedure Evaluation (Signed)
Anesthesia Evaluation    Airway Mallampati: II       Dental   Pulmonary sleep apnea and Continuous Positive Airway Pressure Ventilation , former smoker,    Pulmonary exam normal       Cardiovascular hypertension,     Neuro/Psych    GI/Hepatic   Endo/Other  diabetesMorbid obesity  Renal/GU      Musculoskeletal   Abdominal (+) + obese,   Peds  Hematology   Anesthesia Other Findings   Reproductive/Obstetrics                             Anesthesia Physical Anesthesia Plan  ASA: III  Anesthesia Plan: General   Post-op Pain Management:    Induction: Intravenous  Airway Management Planned: LMA  Additional Equipment:   Intra-op Plan:   Post-operative Plan:   Informed Consent:   Plan Discussed with:   Anesthesia Plan Comments:         Anesthesia Quick Evaluation

## 2014-06-21 ENCOUNTER — Encounter (HOSPITAL_COMMUNITY): Payer: Self-pay | Admitting: Urology

## 2014-06-21 DIAGNOSIS — S61202A Unspecified open wound of right middle finger without damage to nail, initial encounter: Secondary | ICD-10-CM | POA: Diagnosis not present

## 2014-07-05 DIAGNOSIS — G4733 Obstructive sleep apnea (adult) (pediatric): Secondary | ICD-10-CM | POA: Diagnosis not present

## 2014-07-08 DIAGNOSIS — N200 Calculus of kidney: Secondary | ICD-10-CM | POA: Diagnosis not present

## 2014-07-09 ENCOUNTER — Other Ambulatory Visit: Payer: Self-pay | Admitting: Urology

## 2014-07-16 ENCOUNTER — Other Ambulatory Visit: Payer: Self-pay | Admitting: Family Medicine

## 2014-07-16 ENCOUNTER — Other Ambulatory Visit (INDEPENDENT_AMBULATORY_CARE_PROVIDER_SITE_OTHER): Payer: Medicare Other

## 2014-07-16 DIAGNOSIS — E119 Type 2 diabetes mellitus without complications: Secondary | ICD-10-CM

## 2014-07-16 LAB — HEMOGLOBIN A1C: HEMOGLOBIN A1C: 6.2 % (ref 4.6–6.5)

## 2014-07-16 NOTE — Progress Notes (Signed)
Patient scheduled for lithotripsy Monday am at [facility]. Need to review pre-litho instructions as well as health history with patient. Message left with wife yesterday for patient to call back. Another message left on patient's voice mail this afternoon for patient to return call. Informed patient in message if he is not able to get back in touch with an RN today  there is a chance that his procedure may be cancelled if all pre-litho instructions are not followed correctly (regarding blood thinner etc.)  Called and spoke to [facility] at Dr. Cy Blamer office at 1700 and informed her we have been unable to reach patient regarding Monday am.

## 2014-07-16 NOTE — H&P (Signed)
History of Present Illness   [name] returns for followup today to discuss his current stone situation. Again, I last saw him personally in December of last year. At that time, he was doing quite well. He had undergone a laser lithotripsy for a right renal stone about a year ago. That stone turned out to be primarily uric acid, but there was a 20% component of calcium oxalate. We were a little bit surprised to see that much uric acid composition since his urine had not really been consistently acidic. He did well after the procedure and on followup was doing extremely well. Several weeks ago he developed severe recurrent right flank with severe nausea. He was seen as an urgent assessment by Dr [name]. A CT scan was done at the hospital. This had demonstrated a large stone in the proximal ureter. The stone was felt to be approximately 12-13 mm in size. There was thought to be a smaller adjacent stone. The patient had urgent placement of a stent. He has actually done quite well. The majority of his pain has resolved and he has only had some very mild discomfort attributable to the stent. He has had some ongoing hematuria, which is not surprising. He does remain on Eliquis for his ongoing atrial fibrillation. Urinalysis today shows microhematuria with too numerous to count red cells. Not much in the way of any pyuria. Urine pH is 5.5.   On KUB imaging today, the stent appears to be well positioned. Adjacent to the stent is a stone that measures at least 11-12 mm in width and is approximately 2 cm actually in length. The stone is visible on KUB imaging, but not particularly dense. Hounsfield units are in the 600-700 range on CT.      Past Medical History Problems  1. History of Anxiety (F41.9) 2. History of Arthritis 3. History of Atrial fibrillation (I48.91) 4. History of Disorder of heart rhythm (I49.9) 5. History of Gout (M10.9) 6. History of cardiac disorder (Z86.79) 7. History of depression  (Z86.59) 8. History of heartburn (Z87.898) 9. History of sleep apnea (Z87.09)  Surgical History Problems  1. History of Cystoscopy With Insertion Of Ureteral Stent Right 2. History of Cystoscopy With Insertion Of Ureteral Stent Right 3. History of Cystoscopy With Ureteroscopy With Lithotripsy 4. History of Eye Surgery 5. History of Skull Excision  Current Meds 1. Amiodarone HCl TABS; 2.5 mg once daily;  Therapy: (Recorded:07Apr2016) to Recorded 2. Eliquis 5 MG Oral Tablet;  Therapy: (Recorded:08Dec2015) to Recorded 3. Ibuprofen 200 MG Oral Tablet;  Therapy: (Recorded:14Oct2014) to Recorded 4. Lasix TABS (Furosemide);  Therapy: (Recorded:14Oct2014) to Recorded 5. Otezla 30 MG Oral Tablet; 2x daily - once in the morning, once at night;  Therapy: (Recorded:07Apr2016) to Recorded 6. Pacerone 300 MG TABS;  Therapy: (Recorded:14Oct2014) to Recorded 7. PreserVision AREDS CAPS;  Therapy: (Recorded:14Oct2014) to Recorded 8. PROzac 20 MG Oral Capsule (FLUoxetine HCl);  Therapy: (Recorded:14Oct2014) to Recorded 9. Tamsulosin HCl - 0.4 MG Oral Capsule; TAKE ONE CAPSULE BY MOUTH ONCE DAILY;  Therapy: 16Apr2015 to (Evaluate:29May2016)  Requested for: [phone]; Last  Rx:04Jun2015 Ordered  Allergies Medication  1. Zithromax TABS  Family History Problems  1. Family history of Death In The Family Father : Father   14yrs, Heart dz 2. Family history of Death In The Family Mother : Mother   82UMP, sx complications 3. Family history of Family Health Status Number Of Children   1 daughter 4. Family history of Heart Disease : Father 5. Family history of  Heart Disease : Brother  Social History Problems  1. Alcohol Use   less than 1 qd 2. Caffeine Use   1-2 qd 3. Former smoker [phone])   1 ppd for 16yrs, nonsmoker for the past 22yrs 4. Marital History - Currently Married 5. Occupation:   retired  Review of Systems Genitourinary, constitutional, skin, eye, otolaryngeal,  hematologic/lymphatic, cardiovascular, pulmonary, endocrine, musculoskeletal, gastrointestinal, neurological and psychiatric system(s) were reviewed and pertinent findings if present are noted and are otherwise negative.  Genitourinary: urinary frequency, feelings of urinary urgency, weak urinary stream, urinary stream starts and stops and hematuria, but no dysuria.  Gastrointestinal: flank pain and heartburn.  Constitutional: feeling tired (fatigue).  Integumentary: skin rash/lesion and pruritus.  Respiratory: shortness of breath during exertion.  Musculoskeletal: back pain and joint pain.    Vitals Vital Signs [Data Includes: Last 1 Day]  Recorded: 07Apr2016 11:46AM  Blood Pressure: 150 / 78 Temperature: 97 F Heart Rate: 63  Physical Exam Constitutional: Well nourished and well developed . No acute distress.  Neck: The appearance of the neck is normal and no neck mass is present.  Pulmonary: No respiratory distress and normal respiratory rhythm and effort.  Cardiovascular: Heart rate and rhythm are normal . No peripheral edema.  Abdomen: The abdomen is soft and nontender. No masses are palpated. No CVA tenderness. No hernias are palpable. No hepatosplenomegaly noted.  Skin: Normal skin turgor, no visible rash and no visible skin lesions.  Neuro/Psych:. Mood and affect are appropriate.    Results/Data Urine [Data Includes: Last 1 Day]   07Apr2016  COLOR RED   APPEARANCE CLOUDY   SPECIFIC GRAVITY 1.020   pH 5.5   GLUCOSE NEG mg/dL  BILIRUBIN NEG   KETONE NEG mg/dL  BLOOD LARGE   PROTEIN 30 mg/dL  UROBILINOGEN 0.2 mg/dL  NITRITE NEG   LEUKOCYTE ESTERASE SMALL   SQUAMOUS EPITHELIAL/HPF NONE SEEN   WBC 3-6 WBC/hpf  RBC TNTC RBC/hpf  BACTERIA RARE   CRYSTALS NONE SEEN   CASTS NONE SEEN    Assessment Assessed  1. Nephrolithiasis (N20.0)  Plan Health Maintenance  1. UA With REFLEX; [Do Not Release]; Status:Complete;   Done: [id number] 11:08AM Nephrolithiasis  2. KUB;  Status:Complete;   Done: [id number] 12:00AM  Discussion/Summary   [name] has a large right-renal calculus. He is doing well clinically status post stent placement. This stone could be treated with repeat ureteroscopy with lasering or potential ESWL. Percutaneous nephrolithotomy is also an option. The stone is certainly borderline in size. Given the fact that he has had a stent in place and the stone does not look terribly dense, it would be my recommendation to go ahead and try ESWL. It is certainly possible that he will require several procedures to get this very large stone treated adequately, but if we can get good fragmentation, then hopefully he can pass the fragments with removal of the stent. We will try to set something up for him in the next week or two. He will need to hold his Eliquis for at least 5 days. We will also have to keep him off it for a couple of days status post the procedure. He has been able to hold his anticoagulation in the past without any obvious problems.    Signatures Electronically signed by : Rana Snare, M.D.; Jul 08 2014  3:46PM EST

## 2014-07-19 ENCOUNTER — Ambulatory Visit (HOSPITAL_COMMUNITY): Payer: Medicare Other

## 2014-07-19 ENCOUNTER — Encounter (HOSPITAL_COMMUNITY): Payer: Self-pay

## 2014-07-19 ENCOUNTER — Ambulatory Visit (HOSPITAL_COMMUNITY)
Admission: RE | Admit: 2014-07-19 | Discharge: 2014-07-19 | Disposition: A | Discharge status: Home / Self Care | Payer: Medicare Other | Source: Ambulatory Visit | Attending: Urology | Admitting: Urology

## 2014-07-19 ENCOUNTER — Encounter (HOSPITAL_COMMUNITY)
Admission: RE | Disposition: A | Discharge status: Home / Self Care | Payer: Self-pay | Source: Ambulatory Visit | Attending: Urology

## 2014-07-19 ENCOUNTER — Other Ambulatory Visit: Payer: Medicare Other

## 2014-07-19 DIAGNOSIS — N200 Calculus of kidney: Secondary | ICD-10-CM | POA: Diagnosis not present

## 2014-07-19 DIAGNOSIS — G4730 Sleep apnea, unspecified: Secondary | ICD-10-CM | POA: Insufficient documentation

## 2014-07-19 DIAGNOSIS — I4891 Unspecified atrial fibrillation: Secondary | ICD-10-CM | POA: Diagnosis not present

## 2014-07-19 DIAGNOSIS — Z7901 Long term (current) use of anticoagulants: Secondary | ICD-10-CM | POA: Insufficient documentation

## 2014-07-19 DIAGNOSIS — Z791 Long term (current) use of non-steroidal anti-inflammatories (NSAID): Secondary | ICD-10-CM | POA: Insufficient documentation

## 2014-07-19 DIAGNOSIS — Z01818 Encounter for other preprocedural examination: Secondary | ICD-10-CM | POA: Diagnosis not present

## 2014-07-19 DIAGNOSIS — R312 Other microscopic hematuria: Secondary | ICD-10-CM | POA: Insufficient documentation

## 2014-07-19 DIAGNOSIS — I10 Essential (primary) hypertension: Secondary | ICD-10-CM | POA: Diagnosis not present

## 2014-07-19 DIAGNOSIS — E119 Type 2 diabetes mellitus without complications: Secondary | ICD-10-CM | POA: Insufficient documentation

## 2014-07-19 DIAGNOSIS — I509 Heart failure, unspecified: Secondary | ICD-10-CM | POA: Insufficient documentation

## 2014-07-19 DIAGNOSIS — Z87891 Personal history of nicotine dependence: Secondary | ICD-10-CM | POA: Insufficient documentation

## 2014-07-19 DIAGNOSIS — L4050 Arthropathic psoriasis, unspecified: Secondary | ICD-10-CM | POA: Insufficient documentation

## 2014-07-19 SURGERY — LITHOTRIPSY, ESWL
Anesthesia: LOCAL | Laterality: Right

## 2014-07-19 MED ORDER — CIPROFLOXACIN HCL 500 MG PO TABS
500.0000 mg | ORAL_TABLET | ORAL | Status: AC
  Administered 2014-07-19: 500 mg via ORAL
  Filled 2014-07-19: qty 1

## 2014-07-19 MED ORDER — DIAZEPAM 5 MG PO TABS
10.0000 mg | ORAL_TABLET | ORAL | Status: AC
  Administered 2014-07-19: 10 mg via ORAL
  Filled 2014-07-19: qty 2

## 2014-07-19 MED ORDER — DEXTROSE-NACL 5-0.45 % IV SOLN
INTRAVENOUS | Status: DC
  Administered 2014-07-19: 09:00:00 via INTRAVENOUS

## 2014-07-19 MED ORDER — DIPHENHYDRAMINE HCL 25 MG PO CAPS
25.0000 mg | ORAL_CAPSULE | ORAL | Status: AC
  Administered 2014-07-19: 25 mg via ORAL
  Filled 2014-07-19: qty 1

## 2014-07-19 MED ORDER — POTASSIUM CITRATE ER 15 MEQ (1620 MG) PO TBCR: 2.0000 | EXTENDED_RELEASE_TABLET | Freq: Two times a day (BID) | ORAL | Status: DC

## 2014-07-19 NOTE — Op Note (Signed)
See [facility] OP note scanned into chart. 

## 2014-07-19 NOTE — Discharge Instructions (Signed)
See [facility] discharge instructions in chart.  May restart Eliquis in 5 days and anti-inflammatories also in 5 days

## 2014-07-19 NOTE — Interval H&P Note (Signed)
History and Physical Interval Note:  07/19/2014 10:00 AM  [name]  has presented today for surgery, with the diagnosis of RIGHT RENAL CALCULUS  The various methods of treatment have been discussed with the patient and family. After consideration of risks, benefits and other options for treatment, the patient has consented to  Procedure(s): RIGHT EXTRACORPOREAL SHOCK WAVE LITHOTRIPSY (ESWL) (Right) as a surgical intervention .  The patient's history has been reviewed, patient examined, no change in status, stable for surgery.  I have reviewed the patient's chart and labs.  Questions were answered to the patient's satisfaction.     Ezelle Surprenant S

## 2014-07-20 ENCOUNTER — Other Ambulatory Visit: Payer: Self-pay | Admitting: Pulmonary Disease

## 2014-07-20 NOTE — Op Note (Signed)
PATIENT NAMEDORA, [name] MR#:  [id number] DATE OF BIRTH:  May 30, 1945  DATE OF PROCEDURE:  12/10/2011  PREOPERATIVE DIAGNOSIS:  Cataract, left eye.    POSTOPERATIVE DIAGNOSIS:  Cataract, left eye.  PROCEDURE PERFORMED:  Extracapsular cataract extraction using phacoemulsification with placement of an Alcon SN6CWS, 15.5-diopter posterior chamber lens, serial #[id number].  SURGEON:  Loura Back. Shaneil Yazdi, MD  ASSISTANT:  None.  ANESTHESIA:  4% lidocaine and 0.75% Marcaine in a 50/50 mixture with 10 units/mL of Hylenex added, given as a peribulbar.   ANESTHESIOLOGIST: Dr. Benjamine Mola.   COMPLICATIONS:  None.  ESTIMATED BLOOD LOSS:  Less than 1 ml.  DESCRIPTION OF PROCEDURE:  The patient was brought to the operating room and given a peribulbar block.  The patient was then prepped and draped in the usual fashion.  The vertical rectus muscles were imbricated using 5-0 silk sutures.  These sutures were then clamped to the sterile drapes as bridle sutures.  A limbal peritomy was performed extending two clock hours and hemostasis was obtained with cautery.  A partial thickness scleral groove was made at the surgical limbus and dissected anteriorly in a lamellar dissection using an Alcon crescent knife.  The anterior chamber was entered supero-temporally with a Superblade and through the lamellar dissection with a 2.6 mm keratome.  DisCoVisc was used to replace the aqueous and a continuous tear capsulorrhexis was carried out.  Hydrodissection and hydrodelineation were carried out with balanced salt and a 27 gauge canula.  The nucleus was rotated to confirm the effectiveness of the hydrodissection.  Phacoemulsification was carried out using a divide-and-conquer technique.  Total ultrasound time was one minute and 50.2 seconds with an average power of 15.9 percent.  CDE 32.88.   Irrigation/aspiration was used to remove the residual cortex.  DisCoVisc was used to inflate the capsule and the internal  incision was enlarged to 3 mm with the crescent knife.  The intraocular lens was folded and inserted into the capsular bag using the AcrySert delivery system.  Irrigation/aspiration was used to remove the residual DisCoVisc.  Miostat was injected into the anterior chamber through the paracentesis track to inflate the anterior chamber and induce miosis.  The wound was checked for leaks and none were found. The conjunctiva was closed with cautery and the bridle sutures were removed.  Two drops of 0.3% Vigamox were placed on the eye.   An eye shield was placed on the eye.  The patient was discharged to the recovery room in good condition.   ____________________________ Loura Back Daylene Vandenbosch, MD sad:ap D: 12/10/2011 13:36:29 ET T: 12/10/2011 14:00:18 ET JOB#: [id number]  cc: Remo Lipps A. Tysen Roesler, MD, <Dictator> Martie Lee MD ELECTRONICALLY SIGNED 12/31/2011 12:33

## 2014-07-20 NOTE — Op Note (Signed)
PATIENT NAMEHAARIS, [name] MR#:  [id number] DATE OF BIRTH:  February 07, 1946  DATE OF PROCEDURE:  11/19/2011  PREOPERATIVE DIAGNOSIS:  Cataract, right eye.   POSTOPERATIVE DIAGNOSIS:  Cataract, right eye.  PROCEDURE PERFORMED:  Extracapsular cataract extraction using phacoemulsification with placement of an Alcon SN6CWS 16.0-diopter posterior chamber lens, serial # [id number].  SURGEON:  Loura Back. Ryota Treece, MD  ASSISTANT:  None.  ANESTHESIA:  4% lidocaine and 0.75% Marcaine in a 50/50 mixture with 10 units/mL of Hylenex added, given as a peribulbar.  ANESTHESIOLOGIST:  Dr. Benjamine Mola  COMPLICATIONS:  None.  ESTIMATED BLOOD LOSS:  Less than 1 mL.  DESCRIPTION OF PROCEDURE:  The patient was brought to the operating room and given a peribulbar block.  The patient was then prepped and draped in the usual fashion.  The vertical rectus muscles were imbricated using 5-0 silk sutures.  These sutures were then clamped to the sterile drapes as bridle sutures.  A limbal peritomy was performed extending two clock hours and hemostasis was obtained with cautery.  A partial thickness scleral groove was made at the surgical limbus and dissected anteriorly in a lamellar dissection using an Alcon crescent knife.  The anterior chamber was entered superonasally with a Superblade and through the lamellar dissection with a 2.6 mm keratome.  DisCoVisc was used to replace the aqueous and a continuous tear capsulorrhexis was carried out.  Hydrodissection and hydrodelineation were carried out with balanced salt and a 27 gauge canula.  The nucleus was rotated to confirm the effectiveness of the hydrodissection.  Phacoemulsification was carried out using a divide-and-conquer technique.  Total ultrasound time was 1 minute and 40.8 seconds with an average power of 20 percent. CDE 35.24.  Irrigation/aspiration was used to remove the residual cortex.  DisCoVisc was used to inflate the capsule and the internal incision was  enlarged to 3 mm with the crescent knife.  The intraocular lens was folded and inserted into the capsular bag using the AcrySert delivery system.  Irrigation/aspiration was used to remove the residual DisCoVisc.  Miostat was injected into the anterior chamber through the paracentesis track to inflate the anterior chamber and induce miosis.  The wound was checked for leaks and none were found. The conjunctiva was closed with cautery and the bridle sutures were removed.  Two drops of 0.3% Vigamox were placed on the eye.   An eye shield was placed on the eye.  The patient was discharged to the recovery room in good condition.  ____________________________ Loura Back Bama Hanselman, MD sad:cms D: 11/19/2011 13:43:13 ET T: 11/19/2011 13:58:48 ET JOB#: [id number]  cc: Remo Lipps A. Yuleimy Kretz, MD, <Dictator>  Martie Lee MD ELECTRONICALLY SIGNED 11/26/2011 13:43

## 2014-07-26 ENCOUNTER — Encounter: Payer: Self-pay | Admitting: Family Medicine

## 2014-07-26 ENCOUNTER — Ambulatory Visit (INDEPENDENT_AMBULATORY_CARE_PROVIDER_SITE_OTHER): Payer: Medicare Other | Admitting: Family Medicine

## 2014-07-26 VITALS — BP 138/70 | HR 75 | Temp 97.5°F | Ht 69.0 in | Wt 276.0 lb

## 2014-07-26 DIAGNOSIS — Z23 Encounter for immunization: Secondary | ICD-10-CM | POA: Diagnosis not present

## 2014-07-26 DIAGNOSIS — Z0000 Encounter for general adult medical examination without abnormal findings: Secondary | ICD-10-CM | POA: Diagnosis not present

## 2014-07-26 DIAGNOSIS — M129 Arthropathy, unspecified: Secondary | ICD-10-CM

## 2014-07-26 DIAGNOSIS — Z7189 Other specified counseling: Secondary | ICD-10-CM | POA: Insufficient documentation

## 2014-07-26 DIAGNOSIS — I10 Essential (primary) hypertension: Secondary | ICD-10-CM

## 2014-07-26 DIAGNOSIS — R739 Hyperglycemia, unspecified: Secondary | ICD-10-CM

## 2014-07-26 MED ORDER — LISINOPRIL 2.5 MG PO TABS: 2.5000 mg | ORAL_TABLET | Freq: Every day | ORAL | Status: DC

## 2014-07-26 NOTE — Progress Notes (Signed)
Pre visit review using our clinic review tool, if applicable. No additional management support is needed unless otherwise documented below in the visit note.  I have personally reviewed the Medicare Annual Wellness questionnaire and have noted 1. The patient's medical and social history 2. Their use of alcohol, tobacco or illicit drugs 3. Their current medications and supplements 4. The patient's functional ability including ADL's, fall risks, home safety risks and hearing or visual             impairment. 5. Diet and physical activities 6. Evidence for depression or mood disorders  The patients weight, height, BMI have been recorded in the chart and visual acuity is per eye clinic.  I have made referrals, counseling and provided education to the patient based review of the above and I have provided the pt with a written personalized care plan for preventive services.  Provider list updated- see scanned forms.  Routine anticipatory guidance given to patient.  See health maintenance.  Flu 2016 Shingles d/w pt.  PNA 2013 Tetanus 2005, d/w pt.  D/w patient [id number] for colon cancer screening, including IFOB vs. colonoscopy.  Risks and benefits of both were discussed and patient voiced understanding.  Pt elects [id number].  He has IFOB from [facility] and will use that.   Prostate cancer screening per uro.  Advance directive- wife designated if patient were incapacitated.  Cognitive function addressed- see scanned forms- and if abnormal then additional documentation follows.   Now with hyperglycemia, not DM2.  D/w pt about labs, diet and exercise.    Psoriatic arthritis much improve on meds per rheum.   Recently with lithotripsy done per uro.    Hypertension:    Using medication without problems or lightheadedness: yes Chest pain with exertion:no Edema:no Short of breath:rare SOB, mild  PMH and SH reviewed  Meds, vitals, and allergies reviewed.   ROS: See HPI.  Otherwise  negative.    GEN: nad, alert and oriented HEENT: mucous membranes moist NECK: supple w/o LA CV: rrr. PULM: ctab, no inc wob ABD: soft, +bs EXT: no edema SKIN: no acute rash

## 2014-07-26 NOTE — Patient Instructions (Addendum)
Ask Dr. Estanislado Pandy about getting a shingles shot.  Check with your insurance to see if they will cover the tetanus and shingles shot. Use the stool kit that you have and update me on the results (assuming I don't hear first). Take care.  Glad to see you.   Keep working on your weight.   Let's get together in about 6 months, with labs ahead of time.

## 2014-07-26 NOTE — Assessment & Plan Note (Signed)
Flu 2016  Shingles d/w pt.  PNA 2013  Tetanus 2005, d/w pt.  D/w patient [id number] for colon cancer screening, including IFOB vs. colonoscopy. Risks and benefits of both were discussed and patient voiced understanding. Pt elects [id number]. He has IFOB from [facility] and will use that.  Prostate cancer screening per uro.  Advance directive- wife designated if patient were incapacitated.  Cognitive function addressed- see scanned forms- and if abnormal then additional documentation follows.

## 2014-07-26 NOTE — Assessment & Plan Note (Signed)
Controlled, continue current meds.  Will work on diet and exercise, gradual weight loss.

## 2014-07-26 NOTE — Assessment & Plan Note (Signed)
From psoriatic arthritis, much improved on treatment per rheum.

## 2014-07-26 NOTE — Assessment & Plan Note (Addendum)
Not Dm2 by A1c, d/w pt.  He'll work on diet and exercise, recheck in about 6 months.

## 2014-08-05 DIAGNOSIS — N200 Calculus of kidney: Secondary | ICD-10-CM | POA: Diagnosis not present

## 2014-08-13 DIAGNOSIS — Z79899 Other long term (current) drug therapy: Secondary | ICD-10-CM | POA: Diagnosis not present

## 2014-08-20 DIAGNOSIS — M25571 Pain in right ankle and joints of right foot: Secondary | ICD-10-CM | POA: Diagnosis not present

## 2014-08-20 DIAGNOSIS — M170 Bilateral primary osteoarthritis of knee: Secondary | ICD-10-CM | POA: Diagnosis not present

## 2014-08-20 DIAGNOSIS — M25511 Pain in right shoulder: Secondary | ICD-10-CM | POA: Diagnosis not present

## 2014-08-20 DIAGNOSIS — L4050 Arthropathic psoriasis, unspecified: Secondary | ICD-10-CM | POA: Diagnosis not present

## 2014-09-03 ENCOUNTER — Other Ambulatory Visit (HOSPITAL_BASED_OUTPATIENT_CLINIC_OR_DEPARTMENT_OTHER): Payer: Medicare Other

## 2014-09-03 DIAGNOSIS — E559 Vitamin D deficiency, unspecified: Secondary | ICD-10-CM

## 2014-09-03 DIAGNOSIS — R768 Other specified abnormal immunological findings in serum: Secondary | ICD-10-CM

## 2014-09-03 DIAGNOSIS — R760 Raised antibody titer: Secondary | ICD-10-CM | POA: Diagnosis not present

## 2014-09-03 LAB — CBC WITH DIFFERENTIAL/PLATELET
BASO%: 1 % (ref 0.0–2.0)
BASOS ABS: 0.1 10*3/uL (ref 0.0–0.1)
EOS%: 3 % (ref 0.0–7.0)
Eosinophils Absolute: 0.2 10*3/uL (ref 0.0–0.5)
HCT: 36.1 % — ABNORMAL LOW (ref 38.4–49.9)
HGB: 12.3 g/dL — ABNORMAL LOW (ref 13.0–17.1)
LYMPH%: 17.5 % (ref 14.0–49.0)
MCH: 30.2 pg (ref 27.2–33.4)
MCHC: 34 g/dL (ref 32.0–36.0)
MCV: 88.6 fL (ref 79.3–98.0)
MONO#: 0.4 10*3/uL (ref 0.1–0.9)
MONO%: 5.8 % (ref 0.0–14.0)
NEUT#: 4.9 10*3/uL (ref 1.5–6.5)
NEUT%: 72.7 % (ref 39.0–75.0)
Platelets: 229 10*3/uL (ref 140–400)
RBC: 4.08 10*6/uL — AB (ref 4.20–5.82)
RDW: 13.9 % (ref 11.0–14.6)
WBC: 6.7 10*3/uL (ref 4.0–10.3)
lymph#: 1.2 10*3/uL (ref 0.9–3.3)

## 2014-09-03 LAB — COMPREHENSIVE METABOLIC PANEL (CC13)
ALT: 14 U/L (ref 0–55)
ANION GAP: 11 meq/L (ref 3–11)
AST: 15 U/L (ref 5–34)
Albumin: 3.5 g/dL (ref 3.5–5.0)
Alkaline Phosphatase: 125 U/L (ref 40–150)
BUN: 23 mg/dL (ref 7.0–26.0)
CO2: 23 mEq/L (ref 22–29)
CREATININE: 1 mg/dL (ref 0.7–1.3)
Calcium: 9.3 mg/dL (ref 8.4–10.4)
Chloride: 105 mEq/L (ref 98–109)
EGFR: 77 mL/min/{1.73_m2} — ABNORMAL LOW (ref >90)
GLUCOSE: 156 mg/dL — AB (ref 70–140)
POTASSIUM: 4.3 meq/L (ref 3.5–5.1)
Sodium: 139 mEq/L (ref 136–145)
Total Bilirubin: 1.1 mg/dL (ref 0.20–1.20)
Total Protein: 7.5 g/dL (ref 6.4–8.3)

## 2014-09-03 LAB — LACTATE DEHYDROGENASE (CC13): LDH: 169 U/L (ref 125–245)

## 2014-09-08 LAB — SPEP & IFE WITH QIG
Albumin ELP: 3.8 g/dL (ref 3.8–4.8)
Alpha-1-Globulin: 0.3 g/dL (ref 0.2–0.3)
Alpha-2-Globulin: 0.8 g/dL (ref 0.5–0.9)
BETA 2: 0.6 g/dL — AB (ref 0.2–0.5)
Beta Globulin: 0.6 g/dL (ref 0.4–0.6)
Gamma Globulin: 1.2 g/dL (ref 0.8–1.7)
IGA: 514 mg/dL — AB (ref 68–379)
IgG (Immunoglobin G), Serum: 1570 mg/dL (ref 650–1600)
IgM, Serum: 51 mg/dL (ref 41–251)
Total Protein, Serum Electrophoresis: 7.2 g/dL (ref 6.1–8.1)

## 2014-09-08 LAB — SEDIMENTATION RATE: SED RATE: 30 mm/h — AB (ref 0–20)

## 2014-09-08 LAB — KAPPA/LAMBDA LIGHT CHAINS
Kappa free light chain: 2.86 mg/dL — ABNORMAL HIGH (ref 0.33–1.94)
Kappa:Lambda Ratio: 0.82 (ref 0.26–1.65)
LAMBDA FREE LGHT CHN: 3.5 mg/dL — AB (ref 0.57–2.63)

## 2014-09-08 LAB — VITAMIN D 25 HYDROXY (VIT D DEFICIENCY, FRACTURES): VIT D 25 HYDROXY: 42 ng/mL (ref 30–100)

## 2014-09-10 ENCOUNTER — Ambulatory Visit (HOSPITAL_BASED_OUTPATIENT_CLINIC_OR_DEPARTMENT_OTHER): Payer: Medicare Other | Admitting: Hematology and Oncology

## 2014-09-10 VITALS — BP 167/72 | HR 62 | Temp 97.7°F | Resp 18 | Ht 69.0 in | Wt 279.3 lb

## 2014-09-10 DIAGNOSIS — D638 Anemia in other chronic diseases classified elsewhere: Secondary | ICD-10-CM

## 2014-09-10 DIAGNOSIS — R768 Other specified abnormal immunological findings in serum: Secondary | ICD-10-CM

## 2014-09-10 DIAGNOSIS — R894 Abnormal immunological findings in specimens from other organs, systems and tissues: Secondary | ICD-10-CM | POA: Diagnosis not present

## 2014-09-10 DIAGNOSIS — E559 Vitamin D deficiency, unspecified: Secondary | ICD-10-CM

## 2014-09-10 NOTE — Assessment & Plan Note (Addendum)
This is likely anemia of chronic disease. The patient denies recent history of bleeding such as epistaxis, hematuria or hematochezia. He is asymptomatic from the anemia. We will observe for now.  

## 2014-09-10 NOTE — Assessment & Plan Note (Signed)
The pattern is polyclonal, likely reactive to some form of autoimmune arthritis. I reassured the patient at this is consistent with multiple myeloma and he does not need long-term follow-up. I recommend he continue close follow-up with his rheumatologist for management of arthritis.

## 2014-09-10 NOTE — Progress Notes (Signed)
[facility] OFFICE PROGRESS NOTE  [name], MD SUMMARY OF HEMATOLOGIC HISTORY:  [name] is here because of recent abnormal evaluation by his rheumatologist. He has chronic arthritis pain, diffuse in nature of which he takes regular nonsteroidal anti-inflammatory medications. He denies history of abnormal bone pain or bone fracture. Patient denies history of recurrent infection or atypical infections such as shingles of meningitis. Denies chills, night sweats, anorexia or abnormal weight loss. He was seen in December 2015 and workup excluded monoclonal paraproteinemia. He was found to have severe vitamin D deficiency INTERVAL HISTORY: [name] 69 y.o. male returns for further follow-up. He felt that her vitamin D supplement is helping him tremendously. He follows with rheumatologist and was prescribed medication for arthritis and that has also helped him. He feels well. Denies recent new bone pain or infection.  I have reviewed the past medical history, past surgical history, social history and family history with the patient and they are unchanged from previous note.  ALLERGIES:  is allergic to cheese; zithromax; latex; and amlodipine.  MEDICATIONS:  Current Outpatient Prescriptions  Medication Sig Dispense Refill  . amiodarone (PACERONE) 200 MG tablet Take 1 tablet (200 mg total) by mouth daily. 30 tablet 5  . Apremilast 30 MG TABS Take 30 mg by mouth 2 (two) times daily.    . clobetasol ointment (TEMOVATE) 0.05 %     . ELIQUIS 5 MG TABS tablet     . FLUoxetine (PROZAC) 20 MG tablet Take 20 mg by mouth every morning.     . fluticasone (FLONASE) 50 MCG/ACT nasal spray Place 2 sprays into both nostrils as needed for rhinitis.     . furosemide (LASIX) 40 MG tablet Take 1 tablet (40 mg total) by mouth every morning. 90 tablet 3  . lisinopril (PRINIVIL,ZESTRIL) 2.5 MG tablet Take 1 tablet (2.5 mg total) by mouth daily. 90 tablet 3  .  loratadine-pseudoephedrine (CLARITIN-D 12-HOUR) 5-120 MG per tablet Take 1 tablet by mouth 2 (two) times daily.    . Menthol-Zinc Oxide (GOLD BOND EX) Apply topically daily.    . Multiple Vitamins-Minerals (PRESERVISION AREDS 2) CAPS Take 1 capsule by mouth 2 (two) times daily.     . Potassium Citrate 15 MEQ (1620 MG) TBCR Take 2 tablets by mouth 2 (two) times daily. 120 tablet   . rOPINIRole (REQUIP) 0.5 MG tablet TAKE TWO TABLETS BY MOUTH EACH NIGHT AFTER DINNER 60 tablet 6  . tamsulosin (FLOMAX) 0.4 MG CAPS capsule Take 0.4 mg by mouth at bedtime.     . Vitamin D, Ergocalciferol, (DRISDOL) 50000 UNITS CAPS capsule Take 50,000 Units by mouth 2 (two) times a week. Tuesday and Saturday    . zolpidem (AMBIEN) 5 MG tablet     . [DISCONTINUED] loratadine (CLARITIN) 10 MG tablet Take 10 mg by mouth daily.       No current facility-administered medications for this visit.     REVIEW OF SYSTEMS:   Constitutional: Denies fevers, chills or night sweats Eyes: Denies blurriness of vision Ears, nose, mouth, throat, and face: Denies mucositis or sore throat Respiratory: Denies cough, dyspnea or wheezes Cardiovascular: Denies palpitation, chest discomfort or lower extremity swelling Gastrointestinal:  Denies nausea, heartburn or change in bowel habits Skin: Denies abnormal skin rashes Lymphatics: Denies new lymphadenopathy or easy bruising Neurological:Denies numbness, tingling or new weaknesses Behavioral/Psych: Mood is stable, no new changes  All other systems were reviewed with the patient and are negative.  PHYSICAL EXAMINATION: ECOG PERFORMANCE  STATUS: 0 - Asymptomatic  Filed Vitals:   09/10/14 1031  BP: 167/72  Pulse: 62  Temp: 97.7 F (36.5 C)  Resp: 18   Filed Weights   09/10/14 1031  Weight: 279 lb 4.8 oz (126.69 kg)    GENERAL:alert, no distress and comfortable SKIN: skin color, texture, turgor are normal, no rashes or significant lesions EYES: normal, Conjunctiva are pink  and non-injected, sclera clear Musculoskeletal:no cyanosis of digits and no clubbing  NEURO: alert & oriented x 3 with fluent speech, no focal motor/sensory deficits  LABORATORY DATA:  I have reviewed the data as listed No results found for this or any previous visit (from the past 48 hour(s)).  Lab Results  Component Value Date   WBC 6.7 09/03/2014   HGB 12.3* 09/03/2014   HCT 36.1* 09/03/2014   MCV 88.6 09/03/2014   PLT 229 09/03/2014    ASSESSMENT & PLAN:  Raised level of immunoglobulins The pattern is polyclonal, likely reactive to some form of autoimmune arthritis. I reassured the patient at this is consistent with multiple myeloma and he does not need long-term follow-up. I recommend he continue close follow-up with his rheumatologist for management of arthritis.  Vitamin D deficiency This has resolved. He is doing well and felt that the vitamin D supplement was helping. I recommend he continues the same.  Anemia in chronic illness This is likely anemia of chronic disease. The patient denies recent history of bleeding such as epistaxis, hematuria or hematochezia. He is asymptomatic from the anemia. We will observe for now.    All questions were answered. The patient knows to call the clinic with any problems, questions or concerns. No barriers to learning was detected.  I spent 15 minutes counseling the patient face to face. The total time spent in the appointment was 20 minutes and more than 50% was on counseling.     [facility], Francheska Villeda, MD 6/10/201611:02 AM

## 2014-09-10 NOTE — Assessment & Plan Note (Signed)
This has resolved. He is doing well and felt that the vitamin D supplement was helping. I recommend he continues the same.

## 2014-09-14 NOTE — Telephone Encounter (Signed)
error 

## 2014-09-16 DIAGNOSIS — M2550 Pain in unspecified joint: Secondary | ICD-10-CM | POA: Diagnosis not present

## 2014-10-05 DIAGNOSIS — G4733 Obstructive sleep apnea (adult) (pediatric): Secondary | ICD-10-CM | POA: Diagnosis not present

## 2014-10-11 DIAGNOSIS — N200 Calculus of kidney: Secondary | ICD-10-CM | POA: Diagnosis not present

## 2014-10-11 DIAGNOSIS — R310 Gross hematuria: Secondary | ICD-10-CM | POA: Diagnosis not present

## 2014-10-11 DIAGNOSIS — N2889 Other specified disorders of kidney and ureter: Secondary | ICD-10-CM | POA: Diagnosis not present

## 2014-10-11 DIAGNOSIS — R312 Other microscopic hematuria: Secondary | ICD-10-CM | POA: Diagnosis not present

## 2014-10-11 DIAGNOSIS — N201 Calculus of ureter: Secondary | ICD-10-CM | POA: Diagnosis not present

## 2014-10-17 ENCOUNTER — Inpatient Hospital Stay (HOSPITAL_COMMUNITY)
Admission: EM | Admit: 2014-10-17 | Discharge: 2014-10-20 | DRG: 693 | Disposition: A | Discharge status: Home / Self Care | Payer: Medicare Other | Attending: Family Medicine | Admitting: Family Medicine

## 2014-10-17 ENCOUNTER — Emergency Department (HOSPITAL_COMMUNITY): Payer: Medicare Other

## 2014-10-17 ENCOUNTER — Encounter (HOSPITAL_COMMUNITY): Payer: Self-pay

## 2014-10-17 DIAGNOSIS — Z87891 Personal history of nicotine dependence: Secondary | ICD-10-CM | POA: Diagnosis not present

## 2014-10-17 DIAGNOSIS — G4733 Obstructive sleep apnea (adult) (pediatric): Secondary | ICD-10-CM | POA: Diagnosis not present

## 2014-10-17 DIAGNOSIS — K7689 Other specified diseases of liver: Secondary | ICD-10-CM | POA: Diagnosis not present

## 2014-10-17 DIAGNOSIS — K6389 Other specified diseases of intestine: Secondary | ICD-10-CM | POA: Diagnosis not present

## 2014-10-17 DIAGNOSIS — K5730 Diverticulosis of large intestine without perforation or abscess without bleeding: Secondary | ICD-10-CM | POA: Diagnosis not present

## 2014-10-17 DIAGNOSIS — Z8249 Family history of ischemic heart disease and other diseases of the circulatory system: Secondary | ICD-10-CM

## 2014-10-17 DIAGNOSIS — K760 Fatty (change of) liver, not elsewhere classified: Secondary | ICD-10-CM | POA: Diagnosis not present

## 2014-10-17 DIAGNOSIS — N201 Calculus of ureter: Secondary | ICD-10-CM | POA: Diagnosis not present

## 2014-10-17 DIAGNOSIS — I2510 Atherosclerotic heart disease of native coronary artery without angina pectoris: Secondary | ICD-10-CM | POA: Diagnosis not present

## 2014-10-17 DIAGNOSIS — Z91018 Allergy to other foods: Secondary | ICD-10-CM | POA: Diagnosis not present

## 2014-10-17 DIAGNOSIS — I5032 Chronic diastolic (congestive) heart failure: Secondary | ICD-10-CM | POA: Diagnosis not present

## 2014-10-17 DIAGNOSIS — N132 Hydronephrosis with renal and ureteral calculous obstruction: Secondary | ICD-10-CM | POA: Diagnosis not present

## 2014-10-17 DIAGNOSIS — J189 Pneumonia, unspecified organism: Secondary | ICD-10-CM | POA: Diagnosis not present

## 2014-10-17 DIAGNOSIS — I959 Hypotension, unspecified: Secondary | ICD-10-CM | POA: Diagnosis not present

## 2014-10-17 DIAGNOSIS — I10 Essential (primary) hypertension: Secondary | ICD-10-CM | POA: Diagnosis not present

## 2014-10-17 DIAGNOSIS — Z79899 Other long term (current) drug therapy: Secondary | ICD-10-CM | POA: Diagnosis not present

## 2014-10-17 DIAGNOSIS — R112 Nausea with vomiting, unspecified: Secondary | ICD-10-CM | POA: Diagnosis present

## 2014-10-17 DIAGNOSIS — R109 Unspecified abdominal pain: Secondary | ICD-10-CM | POA: Diagnosis not present

## 2014-10-17 DIAGNOSIS — Z807 Family history of other malignant neoplasms of lymphoid, hematopoietic and related tissues: Secondary | ICD-10-CM

## 2014-10-17 DIAGNOSIS — K219 Gastro-esophageal reflux disease without esophagitis: Secondary | ICD-10-CM | POA: Diagnosis not present

## 2014-10-17 DIAGNOSIS — E119 Type 2 diabetes mellitus without complications: Secondary | ICD-10-CM | POA: Diagnosis not present

## 2014-10-17 DIAGNOSIS — I480 Paroxysmal atrial fibrillation: Secondary | ICD-10-CM | POA: Diagnosis not present

## 2014-10-17 DIAGNOSIS — G2581 Restless legs syndrome: Secondary | ICD-10-CM | POA: Diagnosis not present

## 2014-10-17 DIAGNOSIS — R001 Bradycardia, unspecified: Secondary | ICD-10-CM | POA: Diagnosis not present

## 2014-10-17 DIAGNOSIS — M1990 Unspecified osteoarthritis, unspecified site: Secondary | ICD-10-CM | POA: Diagnosis present

## 2014-10-17 DIAGNOSIS — Z791 Long term (current) use of non-steroidal anti-inflammatories (NSAID): Secondary | ICD-10-CM | POA: Diagnosis not present

## 2014-10-17 DIAGNOSIS — Z888 Allergy status to other drugs, medicaments and biological substances status: Secondary | ICD-10-CM

## 2014-10-17 DIAGNOSIS — E559 Vitamin D deficiency, unspecified: Secondary | ICD-10-CM | POA: Diagnosis not present

## 2014-10-17 DIAGNOSIS — Z7901 Long term (current) use of anticoagulants: Secondary | ICD-10-CM | POA: Diagnosis not present

## 2014-10-17 DIAGNOSIS — D649 Anemia, unspecified: Secondary | ICD-10-CM | POA: Diagnosis not present

## 2014-10-17 DIAGNOSIS — R509 Fever, unspecified: Secondary | ICD-10-CM | POA: Diagnosis present

## 2014-10-17 DIAGNOSIS — Z91040 Latex allergy status: Secondary | ICD-10-CM | POA: Diagnosis not present

## 2014-10-17 DIAGNOSIS — Z6841 Body Mass Index (BMI) 40.0 and over, adult: Secondary | ICD-10-CM | POA: Diagnosis not present

## 2014-10-17 DIAGNOSIS — Z881 Allergy status to other antibiotic agents status: Secondary | ICD-10-CM

## 2014-10-17 DIAGNOSIS — Z87442 Personal history of urinary calculi: Secondary | ICD-10-CM | POA: Diagnosis not present

## 2014-10-17 LAB — BASIC METABOLIC PANEL
ANION GAP: 9 (ref 5–15)
BUN: 14 mg/dL (ref 6–20)
CALCIUM: 8.8 mg/dL — AB (ref 8.9–10.3)
CO2: 26 mmol/L (ref 22–32)
CREATININE: 1.09 mg/dL (ref 0.61–1.24)
Chloride: 102 mmol/L (ref 101–111)
GFR calc non Af Amer: >60 mL/min (ref >60)
Glucose, Bld: 154 mg/dL — ABNORMAL HIGH (ref 65–99)
Potassium: 4.1 mmol/L (ref 3.5–5.1)
SODIUM: 137 mmol/L (ref 135–145)

## 2014-10-17 LAB — LIPASE, BLOOD: Lipase: 22 U/L (ref 22–51)

## 2014-10-17 LAB — CBC
HEMATOCRIT: 38.2 % — AB (ref 39.0–52.0)
Hemoglobin: 12.6 g/dL — ABNORMAL LOW (ref 13.0–17.0)
MCH: 29.6 pg (ref 26.0–34.0)
MCHC: 33 g/dL (ref 30.0–36.0)
MCV: 89.9 fL (ref 78.0–100.0)
Platelets: 253 10*3/uL (ref 150–400)
RBC: 4.25 MIL/uL (ref 4.22–5.81)
RDW: 14.1 % (ref 11.5–15.5)
WBC: 8 10*3/uL (ref 4.0–10.5)

## 2014-10-17 LAB — URINALYSIS, ROUTINE W REFLEX MICROSCOPIC
Bilirubin Urine: NEGATIVE
GLUCOSE, UA: NEGATIVE mg/dL
Ketones, ur: NEGATIVE mg/dL
Leukocytes, UA: NEGATIVE
NITRITE: NEGATIVE
PH: 6 (ref 5.0–8.0)
Protein, ur: 30 mg/dL — AB
Specific Gravity, Urine: 1.02 (ref 1.005–1.030)
Urobilinogen, UA: 1 mg/dL (ref 0.0–1.0)

## 2014-10-17 LAB — HEPATIC FUNCTION PANEL
ALT: 21 U/L (ref 17–63)
AST: 28 U/L (ref 15–41)
Albumin: 3.7 g/dL (ref 3.5–5.0)
Alkaline Phosphatase: 102 U/L (ref 38–126)
BILIRUBIN TOTAL: 1.3 mg/dL — AB (ref 0.3–1.2)
Bilirubin, Direct: 0.3 mg/dL (ref 0.1–0.5)
Indirect Bilirubin: 1 mg/dL — ABNORMAL HIGH (ref 0.3–0.9)
Total Protein: 8.2 g/dL — ABNORMAL HIGH (ref 6.5–8.1)

## 2014-10-17 LAB — URINE MICROSCOPIC-ADD ON

## 2014-10-17 LAB — I-STAT CG4 LACTIC ACID, ED
LACTIC ACID, VENOUS: 1.22 mmol/L (ref 0.5–2.0)
Lactic Acid, Venous: 0.75 mmol/L (ref 0.5–2.0)

## 2014-10-17 MED ORDER — LEVOFLOXACIN IN D5W 750 MG/150ML IV SOLN
750.0000 mg | Freq: Once | INTRAVENOUS | Status: AC
  Administered 2014-10-17: 750 mg via INTRAVENOUS
  Filled 2014-10-17: qty 150

## 2014-10-17 MED ORDER — MORPHINE SULFATE 4 MG/ML IJ SOLN
4.0000 mg | Freq: Once | INTRAMUSCULAR | Status: AC
  Administered 2014-10-17: 4 mg via INTRAVENOUS
  Filled 2014-10-17: qty 1

## 2014-10-17 MED ORDER — ACETAMINOPHEN 325 MG PO TABS
650.0000 mg | ORAL_TABLET | Freq: Once | ORAL | Status: AC
  Administered 2014-10-17: 650 mg via ORAL
  Filled 2014-10-17: qty 2

## 2014-10-17 MED ORDER — HYDROMORPHONE HCL 1 MG/ML IJ SOLN
0.5000 mg | Freq: Once | INTRAMUSCULAR | Status: AC
  Administered 2014-10-17: 0.5 mg via INTRAVENOUS
  Filled 2014-10-17: qty 1

## 2014-10-17 MED ORDER — ONDANSETRON HCL 4 MG/2ML IJ SOLN
4.0000 mg | Freq: Once | INTRAMUSCULAR | Status: AC
  Administered 2014-10-17: 4 mg via INTRAVENOUS
  Filled 2014-10-17: qty 2

## 2014-10-17 MED ORDER — SODIUM CHLORIDE 0.9 % IV BOLUS (SEPSIS)
250.0000 mL | Freq: Once | INTRAVENOUS | Status: AC
  Administered 2014-10-17: 250 mL via INTRAVENOUS

## 2014-10-17 NOTE — ED Notes (Signed)
Jana Half RN obtaining labs with IV start

## 2014-10-17 NOTE — ED Notes (Signed)
He c/o low back pain x 2 days.  He saw [facility] this past Monday, who performed CT to evaluate hematuria/incontinence.  He also c/o low grade fevers.

## 2014-10-17 NOTE — ED Notes (Signed)
Lab at bedside
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
## 2014-10-17 NOTE — ED Notes (Signed)
Pt went to restroom. Urine did not make it into the urinal. No urine sample at this time.

## 2014-10-17 NOTE — ED Notes (Signed)
Patient reports hx of sleep apnea.  States he wears bipap at home.  Patient states he has been drifting off to sleep some and is leaned over in the bed which he believes is causing oxygen levels to be lower.  Patient denies shortness of breath.

## 2014-10-17 NOTE — ED Notes (Signed)
Ultrasound at the bedside

## 2014-10-17 NOTE — ED Notes (Addendum)
iStat CG4  0.75 mmol/L

## 2014-10-17 NOTE — ED Notes (Signed)
Pt in CT.

## 2014-10-18 ENCOUNTER — Encounter (HOSPITAL_COMMUNITY)
Admission: EM | Disposition: A | Discharge status: Home / Self Care | Payer: Self-pay | Source: Home / Self Care | Attending: Internal Medicine

## 2014-10-18 ENCOUNTER — Inpatient Hospital Stay (HOSPITAL_COMMUNITY): Payer: Medicare Other | Admitting: Certified Registered Nurse Anesthetist

## 2014-10-18 ENCOUNTER — Encounter (HOSPITAL_COMMUNITY): Payer: Self-pay | Admitting: Internal Medicine

## 2014-10-18 DIAGNOSIS — R509 Fever, unspecified: Secondary | ICD-10-CM

## 2014-10-18 DIAGNOSIS — N201 Calculus of ureter: Secondary | ICD-10-CM | POA: Insufficient documentation

## 2014-10-18 DIAGNOSIS — I480 Paroxysmal atrial fibrillation: Secondary | ICD-10-CM

## 2014-10-18 DIAGNOSIS — R112 Nausea with vomiting, unspecified: Secondary | ICD-10-CM

## 2014-10-18 HISTORY — PX: CYSTOSCOPY W/ URETERAL STENT PLACEMENT: SHX1429

## 2014-10-18 LAB — CBC WITH DIFFERENTIAL/PLATELET
Basophils Absolute: 0.1 10*3/uL (ref 0.0–0.1)
Basophils Relative: 1 % (ref 0–1)
EOS ABS: 0.1 10*3/uL (ref 0.0–0.7)
EOS PCT: 1 % (ref 0–5)
HCT: 33.4 % — ABNORMAL LOW (ref 39.0–52.0)
Hemoglobin: 11.1 g/dL — ABNORMAL LOW (ref 13.0–17.0)
Lymphocytes Relative: 17 % (ref 12–46)
Lymphs Abs: 1.1 10*3/uL (ref 0.7–4.0)
MCH: 29.9 pg (ref 26.0–34.0)
MCHC: 33.2 g/dL (ref 30.0–36.0)
MCV: 90 fL (ref 78.0–100.0)
MONO ABS: 0.6 10*3/uL (ref 0.1–1.0)
Monocytes Relative: 9 % (ref 3–12)
NEUTROS ABS: 4.5 10*3/uL (ref 1.7–7.7)
Neutrophils Relative %: 72 % (ref 43–77)
Platelets: 232 10*3/uL (ref 150–400)
RBC: 3.71 MIL/uL — ABNORMAL LOW (ref 4.22–5.81)
RDW: 14.4 % (ref 11.5–15.5)
WBC: 6.4 10*3/uL (ref 4.0–10.5)

## 2014-10-18 LAB — COMPREHENSIVE METABOLIC PANEL
ALBUMIN: 3.1 g/dL — AB (ref 3.5–5.0)
ALT: 18 U/L (ref 17–63)
ANION GAP: 8 (ref 5–15)
AST: 20 U/L (ref 15–41)
Alkaline Phosphatase: 96 U/L (ref 38–126)
BILIRUBIN TOTAL: 1.2 mg/dL (ref 0.3–1.2)
BUN: 15 mg/dL (ref 6–20)
CO2: 25 mmol/L (ref 22–32)
CREATININE: 1.03 mg/dL (ref 0.61–1.24)
Calcium: 8.3 mg/dL — ABNORMAL LOW (ref 8.9–10.3)
Chloride: 103 mmol/L (ref 101–111)
GFR calc Af Amer: >60 mL/min (ref >60)
GLUCOSE: 127 mg/dL — AB (ref 65–99)
Potassium: 3.7 mmol/L (ref 3.5–5.1)
Sodium: 136 mmol/L (ref 135–145)
Total Protein: 7.3 g/dL (ref 6.5–8.1)

## 2014-10-18 LAB — CLOSTRIDIUM DIFFICILE BY PCR: CDIFFPCR: NEGATIVE

## 2014-10-18 LAB — STREP PNEUMONIAE URINARY ANTIGEN: Strep Pneumo Urinary Antigen: NEGATIVE

## 2014-10-18 LAB — MRSA PCR SCREENING: MRSA BY PCR: NEGATIVE

## 2014-10-18 LAB — LACTIC ACID, PLASMA: Lactic Acid, Venous: 2 mmol/L (ref 0.5–2.0)

## 2014-10-18 LAB — PROCALCITONIN: Procalcitonin: <0.1 ng/mL

## 2014-10-18 LAB — HIV ANTIBODY (ROUTINE TESTING W REFLEX): HIV Screen 4th Generation wRfx: NONREACTIVE

## 2014-10-18 SURGERY — CYSTOSCOPY, WITH RETROGRADE PYELOGRAM AND URETERAL STENT INSERTION
Anesthesia: General | Laterality: Right

## 2014-10-18 MED ORDER — SODIUM CHLORIDE 0.9 % IJ SOLN
3.0000 mL | Freq: Two times a day (BID) | INTRAMUSCULAR | Status: DC
  Administered 2014-10-18 – 2014-10-20 (×3): 3 mL via INTRAVENOUS

## 2014-10-18 MED ORDER — FLUOXETINE HCL 20 MG PO TABS
20.0000 mg | ORAL_TABLET | Freq: Every morning | ORAL | Status: DC
  Administered 2014-10-18 – 2014-10-20 (×3): 20 mg via ORAL
  Filled 2014-10-18 (×5): qty 1

## 2014-10-18 MED ORDER — ONDANSETRON HCL 4 MG PO TABS: 4.0000 mg | ORAL_TABLET | Freq: Four times a day (QID) | ORAL | Status: DC | PRN

## 2014-10-18 MED ORDER — PIPERACILLIN-TAZOBACTAM 3.375 G IVPB
INTRAVENOUS | Status: AC
  Filled 2014-10-18: qty 50

## 2014-10-18 MED ORDER — FLUTICASONE PROPIONATE 50 MCG/ACT NA SUSP
2.0000 | NASAL | Status: DC | PRN
  Filled 2014-10-18: qty 16

## 2014-10-18 MED ORDER — APIXABAN 2.5 MG PO TABS
5.0000 mg | ORAL_TABLET | Freq: Two times a day (BID) | ORAL | Status: DC
  Administered 2014-10-18 – 2014-10-20 (×4): 5 mg via ORAL
  Filled 2014-10-18 (×5): qty 2

## 2014-10-18 MED ORDER — ROPINIROLE HCL 1 MG PO TABS
1.0000 mg | ORAL_TABLET | Freq: Every day | ORAL | Status: DC
  Administered 2014-10-18 – 2014-10-19 (×2): 1 mg via ORAL
  Filled 2014-10-18 (×4): qty 1

## 2014-10-18 MED ORDER — PROSIGHT PO TABS
1.0000 | ORAL_TABLET | Freq: Two times a day (BID) | ORAL | Status: DC
  Administered 2014-10-18 – 2014-10-20 (×4): 1 via ORAL
  Filled 2014-10-18 (×7): qty 1

## 2014-10-18 MED ORDER — APREMILAST 30 MG PO TABS
30.0000 mg | ORAL_TABLET | Freq: Two times a day (BID) | ORAL | Status: DC
  Administered 2014-10-19 – 2014-10-20 (×3): 30 mg via ORAL

## 2014-10-18 MED ORDER — ACETAMINOPHEN 650 MG RE SUPP: 650.0000 mg | Freq: Four times a day (QID) | RECTAL | Status: DC | PRN

## 2014-10-18 MED ORDER — PROPOFOL 10 MG/ML IV BOLUS
INTRAVENOUS | Status: AC
Start: 2014-10-18 — End: 2014-10-18
  Filled 2014-10-18: qty 20

## 2014-10-18 MED ORDER — ONDANSETRON HCL 4 MG/2ML IJ SOLN
INTRAMUSCULAR | Status: DC | PRN
  Administered 2014-10-18: 4 mg via INTRAVENOUS

## 2014-10-18 MED ORDER — LIDOCAINE HCL (CARDIAC) 20 MG/ML IV SOLN
INTRAVENOUS | Status: AC
  Filled 2014-10-18: qty 5

## 2014-10-18 MED ORDER — EPHEDRINE SULFATE 50 MG/ML IJ SOLN
INTRAMUSCULAR | Status: DC | PRN
  Administered 2014-10-18: 10 mg via INTRAVENOUS

## 2014-10-18 MED ORDER — VITAMIN D 1000 UNITS PO TABS
5000.0000 [IU] | ORAL_TABLET | Freq: Every day | ORAL | Status: DC
  Administered 2014-10-18 – 2014-10-20 (×3): 5000 [IU] via ORAL
  Filled 2014-10-18 (×3): qty 5

## 2014-10-18 MED ORDER — IOHEXOL 350 MG/ML SOLN
INTRAVENOUS | Status: DC | PRN
  Administered 2014-10-18: 15 mL via INTRAVENOUS

## 2014-10-18 MED ORDER — PROMETHAZINE HCL 25 MG/ML IJ SOLN: 6.2500 mg | INTRAMUSCULAR | Status: DC | PRN

## 2014-10-18 MED ORDER — LACTATED RINGERS IV SOLN
INTRAVENOUS | Status: DC | PRN
  Administered 2014-10-18: 08:00:00 via INTRAVENOUS

## 2014-10-18 MED ORDER — PROPOFOL 10 MG/ML IV BOLUS
INTRAVENOUS | Status: DC | PRN
  Administered 2014-10-18: 150 mg via INTRAVENOUS
  Administered 2014-10-18: 50 mg via INTRAVENOUS

## 2014-10-18 MED ORDER — FENTANYL CITRATE (PF) 250 MCG/5ML IJ SOLN
INTRAMUSCULAR | Status: DC | PRN
Start: 2014-10-18 — End: 2014-10-18
  Administered 2014-10-18: 25 ug via INTRAVENOUS
  Administered 2014-10-18: 50 ug via INTRAVENOUS
  Administered 2014-10-18: 25 ug via INTRAVENOUS

## 2014-10-18 MED ORDER — ONDANSETRON HCL 4 MG/2ML IJ SOLN
4.0000 mg | Freq: Four times a day (QID) | INTRAMUSCULAR | Status: DC | PRN
  Administered 2014-10-18: 4 mg via INTRAVENOUS
  Filled 2014-10-18: qty 2

## 2014-10-18 MED ORDER — MEPERIDINE HCL 50 MG/ML IJ SOLN: 6.2500 mg | INTRAMUSCULAR | Status: DC | PRN

## 2014-10-18 MED ORDER — APIXABAN 2.5 MG PO TABS: 5.0000 mg | ORAL_TABLET | Freq: Two times a day (BID) | ORAL | Status: DC

## 2014-10-18 MED ORDER — SODIUM CHLORIDE 0.9 % IR SOLN
Status: DC | PRN
  Administered 2014-10-18: 1000 mL

## 2014-10-18 MED ORDER — PHENAZOPYRIDINE HCL 200 MG PO TABS
200.0000 mg | ORAL_TABLET | Freq: Three times a day (TID) | ORAL | Status: DC | PRN
  Administered 2014-10-18: 200 mg via ORAL
  Filled 2014-10-18 (×4): qty 1

## 2014-10-18 MED ORDER — TAMSULOSIN HCL 0.4 MG PO CAPS
0.4000 mg | ORAL_CAPSULE | Freq: Two times a day (BID) | ORAL | Status: DC
  Administered 2014-10-18 – 2014-10-20 (×5): 0.4 mg via ORAL
  Filled 2014-10-18 (×5): qty 1

## 2014-10-18 MED ORDER — LIDOCAINE HCL (CARDIAC) 20 MG/ML IV SOLN
INTRAVENOUS | Status: DC | PRN
  Administered 2014-10-18: 50 mg via INTRAVENOUS

## 2014-10-18 MED ORDER — ACETAMINOPHEN 325 MG PO TABS
650.0000 mg | ORAL_TABLET | Freq: Four times a day (QID) | ORAL | Status: DC | PRN
  Administered 2014-10-18: 650 mg via ORAL
  Filled 2014-10-18: qty 2

## 2014-10-18 MED ORDER — MIDAZOLAM HCL 2 MG/2ML IJ SOLN
INTRAMUSCULAR | Status: AC
  Filled 2014-10-18: qty 2

## 2014-10-18 MED ORDER — MIDAZOLAM HCL 5 MG/5ML IJ SOLN
INTRAMUSCULAR | Status: DC | PRN
Start: 2014-10-18 — End: 2014-10-18
  Administered 2014-10-18: 2 mg via INTRAVENOUS

## 2014-10-18 MED ORDER — MORPHINE SULFATE 2 MG/ML IJ SOLN
1.0000 mg | INTRAMUSCULAR | Status: DC | PRN
  Administered 2014-10-18: 1 mg via INTRAVENOUS
  Filled 2014-10-18: qty 1

## 2014-10-18 MED ORDER — FENTANYL CITRATE (PF) 100 MCG/2ML IJ SOLN
INTRAMUSCULAR | Status: AC
  Filled 2014-10-18: qty 2

## 2014-10-18 MED ORDER — EPHEDRINE SULFATE 50 MG/ML IJ SOLN
INTRAMUSCULAR | Status: AC
  Filled 2014-10-18: qty 1

## 2014-10-18 MED ORDER — FENTANYL CITRATE (PF) 100 MCG/2ML IJ SOLN: 25.0000 ug | INTRAMUSCULAR | Status: DC | PRN

## 2014-10-18 MED ORDER — PIPERACILLIN-TAZOBACTAM 3.375 G IVPB
3.3750 g | Freq: Three times a day (TID) | INTRAVENOUS | Status: DC
  Administered 2014-10-18 – 2014-10-19 (×5): 3.375 g via INTRAVENOUS
  Filled 2014-10-18 (×8): qty 50

## 2014-10-18 MED ORDER — LEVOFLOXACIN IN D5W 750 MG/150ML IV SOLN: 750.0000 mg | INTRAVENOUS | Status: DC

## 2014-10-18 MED ORDER — ONDANSETRON HCL 4 MG/2ML IJ SOLN
INTRAMUSCULAR | Status: AC
  Filled 2014-10-18: qty 2

## 2014-10-18 MED ORDER — PRESERVISION AREDS 2 PO CAPS: 1.0000 | ORAL_CAPSULE | Freq: Two times a day (BID) | ORAL | Status: DC

## 2014-10-18 MED ORDER — AMIODARONE HCL 200 MG PO TABS
200.0000 mg | ORAL_TABLET | Freq: Every day | ORAL | Status: DC
Start: 2014-10-18 — End: 2014-10-20
  Administered 2014-10-18 – 2014-10-20 (×3): 200 mg via ORAL
  Filled 2014-10-18 (×3): qty 1

## 2014-10-18 SURGICAL SUPPLY — 27 items
BAG URO CATCHER STRL LF (DRAPE) ×2 IMPLANT
BASKET LASER NITINOL 1.9FR (BASKET) IMPLANT
BASKET STNLS GEMINI 4WIRE 3FR (BASKET) IMPLANT
BASKET ZERO TIP NITINOL 2.4FR (BASKET) IMPLANT
BRUSH URET BIOPSY 3F (UROLOGICAL SUPPLIES) IMPLANT
CATH INTERMIT  6FR 70CM (CATHETERS) ×2 IMPLANT
CLOTH BEACON ORANGE TIMEOUT ST (SAFETY) ×2 IMPLANT
FIBER LASER FLEXIVA 1000 (UROLOGICAL SUPPLIES) IMPLANT
FIBER LASER FLEXIVA 200 (UROLOGICAL SUPPLIES) IMPLANT
FIBER LASER FLEXIVA 365 (UROLOGICAL SUPPLIES) IMPLANT
FIBER LASER FLEXIVA 550 (UROLOGICAL SUPPLIES) IMPLANT
FIBER LASER TRAC TIP (UROLOGICAL SUPPLIES) IMPLANT
GLOVE BIOGEL M STRL SZ7.5 (GLOVE) ×2 IMPLANT
GOWN STRL REUS W/TWL XL LVL3 (GOWN DISPOSABLE) ×4 IMPLANT
GUIDEWIRE ANG ZIPWIRE 038X150 (WIRE) IMPLANT
GUIDEWIRE STR DUAL SENSOR (WIRE) ×2 IMPLANT
HOVERMATT HALF SINGLE USE (PATIENT TRANSFER) ×2 IMPLANT
IV NS IRRIG 3000ML ARTHROMATIC (IV SOLUTION) ×2 IMPLANT
KIT BALLIN UROMAX 15FX10 (LABEL) IMPLANT
KIT BALLN UROMAX 15FX4 (MISCELLANEOUS) IMPLANT
KIT BALLN UROMAX 26 75X4 (MISCELLANEOUS)
PACK CYSTO (CUSTOM PROCEDURE TRAY) ×2 IMPLANT
SET HIGH PRES BAL DIL (LABEL)
SHEATH ACCESS URETERAL 24CM (SHEATH) IMPLANT
SHEATH ACCESS URETERAL 54CM (SHEATH) IMPLANT
STENT CONTOUR 6FRX26X.038 (STENTS) ×2 IMPLANT
SYRINGE IRR TOOMEY STRL 70CC (SYRINGE) IMPLANT

## 2014-10-18 NOTE — Op Note (Signed)
Preoperative diagnosis: Fever, right hydronephrosis, right proximal ureteral stones Postoperative diagnosis: Same  Procedure: Cystoscopy, right retrograde pyelogram, right ureteral stent placement  Surgeon: Junious Silk  Anesthesia: Gen.  Indication for procedure: Patient's a 69 year old white male was passing some proximal right ureteral stones. He developed spiking fevers consistent with pyelonephritis and was brought urgently for cystoscopy right ureteral stent placement to drain the right kidney.  Findings: On cystoscopy the urethra showed some wide caliber soft strictures in the distal bulb and also the proximal bulb. These were easily dilated with the scope. Prostate had some mild hypertrophy and elevation of the bladder neck. The bladder mucosa appeared normal. The trigone and ureteral orifices were normal. There were no stones or foreign bodies in the bladder. The bladder was otherwise unremarkable.  Right retrograde pyelogram-this outlined a single ureter single collecting system unit. The distal mid and proximal ureter appeared normal up to the region of the UPJ where initially contrast would not pass retrograde and once it did there were some irregular densities in the proximal ureter. The collecting system was mildly dilated without filling defect. The kidney slightly malrotated and had the appearance of UPJ obstruction which could be from the stones or malrotation.  Description of procedure: After consent was obtained patient brought to the operating room. After adequate anesthesia he is placed in lithotomy position and prepped and draped in the usual sterile fashion. A timeout was performed to confirm the patient and procedure. Patient was given preoperative Zosyn and SCDs were in place. The cystoscope was passed per urethra and the bladder inspected. The right ureteral orifice was cannulated with a 6 [place] open-ended catheter and retrograde injection of contrast was performed. A sensor  wire was then advanced and coiled in the upper pole collecting system. Advanced the 6 [place] open-ended catheter to the region of the kidney and remove the wire. A second retrograde confirm proper placement of the wire in the collecting system and that backed the 6 [place] open-ended catheter down into the renal pelvis. The collecting system again was mildly dilated consistent with a mild UPJ obstruction which could be from the stones or malrotation. Confirming proper placement of the wire the wire was repassed and coiled in the upper pole collecting system. The 6 [place] open-ended catheter was removed. I then passed a 6 x 26 cm ureteral stent and remove the wire. A good coils in the upper pole collecting system and a good coil in the bladder. some dark and debris laden urine draining from the right side after the stent placement. A 16 French silicone Foley catheter was placed a max train the system.  Patient was awakened taken to recovery room in stable condition.  Complications: None  Blood loss: Minimal  Specimens: None  Drains: #1 6 x 26 cm right ureteral stent without string, #216 French Foley catheter

## 2014-10-18 NOTE — Discharge Instructions (Signed)
Ureteral Stent Implantation, Care After Refer to this sheet in the next few weeks. These instructions provide you with information on caring for yourself after your procedure. Your health care provider may also give you more specific instructions. Your treatment has been planned according to current medical practices, but problems sometimes occur. Call your health care provider if you have any problems or questions after your procedure. WHAT TO EXPECT AFTER THE PROCEDURE You should be back to normal activity within 48 hours after the procedure. Nausea and vomiting may occur and are commonly the result of anesthesia. It is common to experience sharp pain in the back or lower abdomen and penis with voiding. This is caused by movement of the ends of the stent with the act of urinating.It usually goes away within minutes after you have stopped urinating. HOME CARE INSTRUCTIONS Make sure to drink plenty of fluids. You may have small amounts of bleeding, causing your urine to be red. This is normal. Certain movements may trigger pain or a feeling that you need to urinate. You may be given medicines to prevent infection or bladder spasms. Be sure to take all medicines as directed. Only take over-the-counter or prescription medicines for pain, discomfort, or fever as directed by your health care provider. Do not take aspirin, as this can make bleeding worse.  Your stent will be left in until the stones are removed. This may take a few weeks or longer. Be sure to keep all follow-up appointments so your health care provider can check that you are healing properly and plan to remove the stones and the stent.   SEEK MEDICAL CARE IF:  You experience increasing pain.  Your pain medicine is not working. SEEK IMMEDIATE MEDICAL CARE IF:  Your urine is dark red or has blood clots.  You are leaking urine (incontinent).  You have a fever, chills, feeling sick to your stomach (nausea), or vomiting.  Your pain is  not relieved by pain medicine.  The end of the stent comes out of the urethra.  You are unable to urinate. Document Released: 11/19/2012 Document Revised: 03/24/2013 Document Reviewed: 11/19/2012 [facility] Patient Information 2015 [place], [place]. This information is not intended to replace advice given to you by your health care provider. Make sure you discuss any questions you have with your health care provider.

## 2014-10-18 NOTE — Anesthesia Postprocedure Evaluation (Signed)
  Anesthesia Post-op Note  Patient: [name]  Procedure(s) Performed: Procedure(s) (LRB): CYSTOSCOPY WITH RETROGRADE PYELOGRAM/URETERAL STENT PLACEMENT (Right)  Patient Location: PACU  Anesthesia Type: General  Level of Consciousness: awake and alert   Airway and Oxygen Therapy: Patient Spontanous Breathing  Post-op Pain: mild  Post-op Assessment: Post-op Vital signs reviewed, Patient's Cardiovascular Status Stable, Respiratory Function Stable, Patent Airway and No signs of Nausea or vomiting  Last Vitals:  Filed Vitals:   10/18/14 1410  BP: 126/61  Pulse: 52  Temp: 36.6 C  Resp: 16    Post-op Vital Signs: stable   Complications: No apparent anesthesia complications
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
## 2014-10-18 NOTE — Anesthesia Procedure Notes (Signed)
Procedure Name: LMA Insertion Date/Time: 10/18/2014 7:55 AM Performed by: Chandra Batch A Pre-anesthesia Checklist: Patient identified, Emergency Drugs available, Suction available, Patient being monitored and Timeout performed Patient Re-evaluated:Patient Re-evaluated prior to inductionOxygen Delivery Method: Circle system utilized Preoxygenation: Pre-oxygenation with 100% oxygen Intubation Type: IV induction Ventilation: Two handed mask ventilation required LMA: LMA inserted LMA Size: 4.0 Number of attempts: 2 Tube secured with: Tape Dental Injury: Teeth and Oropharynx as per pre-operative assessment

## 2014-10-18 NOTE — Anesthesia Preprocedure Evaluation (Signed)
Anesthesia Evaluation  Patient identified by MRN, date of birth, ID band Patient awake    Reviewed: Allergy & Precautions, NPO status , Patient's Chart, lab work & pertinent test results  History of Anesthesia Complications (+) PONV  Airway Mallampati: II  TM Distance: >3 FB Neck ROM: Full    Dental no notable dental hx.    Pulmonary sleep apnea and Continuous Positive Airway Pressure Ventilation , former smoker,  breath sounds clear to auscultation  Pulmonary exam normal       Cardiovascular hypertension, Pt. on medications Normal cardiovascular exam+ dysrhythmias Atrial Fibrillation and Ventricular Tachycardia Rhythm:Regular Rate:Normal     Neuro/Psych negative neurological ROS  negative psych ROS   GI/Hepatic negative GI ROS, Neg liver ROS,   Endo/Other  diabetes, Type 2Morbid obesity  Renal/GU negative Renal ROS  negative genitourinary   Musculoskeletal negative musculoskeletal ROS (+)   Abdominal   Peds negative pediatric ROS (+)  Hematology negative hematology ROS (+)   Anesthesia Other Findings   Reproductive/Obstetrics negative OB ROS                             Anesthesia Physical Anesthesia Plan  ASA: III  Anesthesia Plan: General   Post-op Pain Management:    Induction: Intravenous  Airway Management Planned: LMA  Additional Equipment:   Intra-op Plan:   Post-operative Plan: Extubation in OR  Informed Consent: I have reviewed the patients History and Physical, chart, labs and discussed the procedure including the risks, benefits and alternatives for the proposed anesthesia with the patient or authorized representative who has indicated his/her understanding and acceptance.   Dental advisory given  Plan Discussed with: CRNA  Anesthesia Plan Comments:         Anesthesia Quick Evaluation
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
## 2014-10-18 NOTE — Progress Notes (Signed)
[facility] PROGRESS NOTE  [name] MBE:[id number] DOB: 1946/01/28 DOA: 10/17/2014 PCP: [name], MD  brief narrative 69 year old male with history of A. fib on anticoagulation, chronic bradycardia, CHF with last EF of 55-60%, after sleep apnea, history of kidney stones status post lithotripsy in April 2016 presented to the ED with persistent nausea and vomiting with right flank pain and subjective fever with chills. In the ED patient found to be in SIRS with fever and hypotension. UA was unremarkable. Chest x-ray negative for any infection. Patient size radiologist recently and had a CT scan done which showed right ureteric stone and patient was discharged on Flomax. Repeat CT of the abdomen done in the ED showed rt UPJ stone with pelviectasis. Patient seen by urology and taken to the OR and underwent cystoscopy with right retrograde pyelogram and right ureteral stent placed.  Assessment/Plan:  SIRS Possibly associated with right UPJ stone and? Pyelonephritis/ left lower lung infiltrate seen on abdominal CT. -On empiric IV Zosyn. -Patient underwent cystoscopy with right retrograde pyelogram and right ureteral stent placed this morning. Tolerated well. A Foley catheter has been placed as well draining clear urine. -Follow blood cultures and urine cultures. -Appreciate neurology recommendations.  Paroxysmal A. fib Continue amiodarone. Will resume eliquis this evening.  OSA on BiPAP Resume.  Chronic diastolic CHF Hold Lasix given low blood pressure.  Diet: Heart healthy  Code Status: Full code Family Communication: None at bedside Disposition Plan: monitor on telemetry.   Consultants:  Dr Junious Silk ( urology)  Procedures: cystoscopy with right retrograde pyelogram and right ureteral stent placed on 7/18   Antibiotics:  IV zosyn 7/18--  HPI/Subjective: Seen and examined after returning from OR. Reports mild rt flank pain. No fever, chills, nausea or  vomiting  Objective: Filed Vitals:   10/18/14 0915  BP: 107/57  Pulse: 56  Temp: 98.6 F (37 C)  Resp: 15    Intake/Output Summary (Last 24 hours) at 10/18/14 1330 Last data filed at 10/18/14 0900  Gross per 24 hour  Intake   1060 ml  Output    540 ml  Net    520 ml   Filed Weights   10/18/14 0047 10/18/14 0500  Weight: 125.011 kg (275 lb 9.6 oz) 125.011 kg (275 lb 9.6 oz)    Exam:   General: Elderly male in no acute distress  HEENT: No pallor, moist oral mucosa, supple neck  Chest: Auscultation bilaterally,  CVS: normal s1 and s2, no murmurs  GI: soft, nondistended, nontender, bowel sounds present, foley in place  musculoskeletal : warm, no edema,     Data Reviewed: Basic Metabolic Panel:  Recent Labs Lab 10/17/14 1738 10/18/14 0350  NA 137 136  K 4.1 3.7  CL 102 103  CO2 26 25  GLUCOSE 154* 127*  BUN 14 15  CREATININE 1.09 1.03  CALCIUM 8.8* 8.3*   Liver Function Tests:  Recent Labs Lab 10/17/14 1738 10/18/14 0350  AST 28 20  ALT 21 18  ALKPHOS 102 96  BILITOT 1.3* 1.2  PROT 8.2* 7.3  ALBUMIN 3.7 3.1*    Recent Labs Lab 10/17/14 1738  LIPASE 22   No results for input(s): AMMONIA in the last 168 hours. CBC:  Recent Labs Lab 10/17/14 1738 10/18/14 0350  WBC 8.0 6.4  NEUTROABS  --  4.5  HGB 12.6* 11.1*  HCT 38.2* 33.4*  MCV 89.9 90.0  PLT 253 232   Cardiac Enzymes: No results for input(s): CKTOTAL, CKMB, CKMBINDEX, TROPONINI in the  last 168 hours. BNP (last 3 results) No results for input(s): BNP in the last 8760 hours.  ProBNP (last 3 results) No results for input(s): PROBNP in the last 8760 hours.  CBG: No results for input(s): GLUCAP in the last 168 hours.  Recent Results (from the past 240 hour(s))  MRSA PCR Screening     Status: None   Collection Time: 10/18/14  2:03 AM  Result Value Ref Range Status   MRSA by PCR NEGATIVE NEGATIVE Final    Comment:        The GeneXpert MRSA Assay (FDA approved for NASAL  specimens only), is one component of a comprehensive MRSA colonization surveillance program. It is not intended to diagnose MRSA infection nor to guide or monitor treatment for MRSA infections.   Clostridium Difficile by PCR (not at [facility])     Status: None   Collection Time: 10/18/14  9:48 AM  Result Value Ref Range Status   C difficile by pcr NEGATIVE NEGATIVE Final     Studies: Ct Abdomen Pelvis Wo Contrast  10/17/2014   CLINICAL DATA:  69 year old male with right flank pain. History of kidney stones.  EXAM: CT ABDOMEN AND PELVIS WITHOUT CONTRAST  TECHNIQUE: Multidetector CT imaging of the abdomen and pelvis was performed following the standard protocol without IV contrast.  COMPARISON:  CT dated 10/11/2014  FINDINGS: Evaluation of this exam is limited in the absence of intravenous contrast.  Partially visualized patchy area of ground-glass opacity at the left lung base posteriorly, compatible with atelectasis versus pneumonia. No intra-abdominal free air or free fluid.  Mild fatty infiltration of the liver. The gallbladder, pancreas, spleen, adrenal glands appear unremarkable. A subcentimeter left renal cortical exophytic lesion is not characterized. There is no hydronephrosis or nephrolithiasis on the left. There appears to be three adjacent stones measuring up to 5 mm and a combined length of 1.2 cm at the right UPJ. There is mild pelviectasis of the right kidney, similar to prior study. The urinary bladder is predominantly collapsed. The prostate and seminal vesicles are grossly unremarkable. There is scattered sigmoid diverticula without acute inflammatory changes. There is no evidence of bowel obstruction or inflammation. Normal appendix.  There is aortoiliac atherosclerotic disease. There is no lymphadenopathy. There are sclerotic changes of the left iliac bone compatible with Paget's disease. There is degenerative changes of the spine.  IMPRESSION: Right UPJ calculi with mild right renal  pelviectasis, stable from prior study.   Electronically Signed   By: Anner Crete M.D.   On: 10/17/2014 20:35   Dg Abd Acute W/chest  10/17/2014   CLINICAL DATA:  Back pain, fever and chills  EXAM: DG ABDOMEN ACUTE W/ 1V CHEST  COMPARISON:  06/05/2013  FINDINGS: Cardiac shadow is mildly enlarged but stable. The lungs are well aerated bilaterally.  Scattered large and small bowel gas is noted. No free air is seen. The known right proximal ureteral stone is not well visualized on this exam. No other focal abnormality is seen.  IMPRESSION: Nonspecific chest and abdomen. The known proximal right ureteral stone is not well visualized.   Electronically Signed   By: Inez Catalina M.D.   On: 10/17/2014 18:08   US Abdomen Limited Ruq  10/17/2014   CLINICAL DATA:  69 year old male with right upper quadrant abdominal pain  EXAM: US ABDOMEN LIMITED - RIGHT UPPER QUADRANT  COMPARISON:  CT dated 10/17/2014  FINDINGS: Gallbladder:  No gallstones or wall thickening visualized. No sonographic Murphy sign noted.  Common bile duct:  Diameter: 4 ureter the distal portion of the common bile duct is not well visualized.  Liver:  Diffuse hepatic steatosis.  IMPRESSION: Unremarkable gallbladder.  Fatty liver.   Electronically Signed   By: Anner Crete M.D.   On: 10/17/2014 22:03    Scheduled Meds: . amiodarone  200 mg Oral Daily  . Apremilast  30 mg Oral BID  . cholecalciferol  5,000 Units Oral Daily  . FLUoxetine  20 mg Oral q morning - 10a  . multivitamin  1 tablet Oral BID  . piperacillin-tazobactam (ZOSYN)  IV  3.375 g Intravenous 3 times per day  . rOPINIRole  1 mg Oral QHS  . sodium chloride  3 mL Intravenous Q12H  . tamsulosin  0.4 mg Oral BID   Continuous Infusions:     Time spent: 20 minutes    DHUNGEL, [place]  [facility] Pager [phone] If 7PM-7AM, please contact night-coverage at [email or web address], password [facility] 10/18/2014, 1:30 PM  LOS: 1 day

## 2014-10-18 NOTE — Transfer of Care (Signed)
Immediate Anesthesia Transfer of Care Note  Patient: [name]  Procedure(s) Performed: Procedure(s): CYSTOSCOPY WITH RETROGRADE PYELOGRAM/URETERAL STENT PLACEMENT (Right)  Patient Location: PACU  Anesthesia Type:General  Level of Consciousness: awake, alert  and oriented  Airway & Oxygen Therapy: Patient Spontanous Breathing and Patient connected to face mask oxygen  Post-op Assessment: Report given to RN and Post -op Vital signs reviewed and stable  Post vital signs: Reviewed and stable  Last Vitals:  Filed Vitals:   10/18/14 0837  BP: 116/60  Pulse: 64  Temp: 36.9 C  Resp: 15    Complications: No apparent anesthesia complications

## 2014-10-18 NOTE — ED Notes (Signed)
Dr. Kakrakandy at bedside. 
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
## 2014-10-18 NOTE — Progress Notes (Signed)
Pt is using his home Bipap machine, tubing and nasal pillows. RT assembled Bipap. Bipap appears to be in good working condition and there are no visible frays in the power cord. [employer] has been contacted and will inspect Bipap once available. Pt states he does not need any assistance with mask application. RT will continue to monitor as needed.

## 2014-10-19 ENCOUNTER — Encounter (HOSPITAL_COMMUNITY): Payer: Self-pay | Admitting: Urology

## 2014-10-19 DIAGNOSIS — R001 Bradycardia, unspecified: Secondary | ICD-10-CM

## 2014-10-19 LAB — URINE CULTURE: Culture: NO GROWTH

## 2014-10-19 LAB — LEGIONELLA ANTIGEN, URINE

## 2014-10-19 NOTE — Progress Notes (Signed)
1 Day Post-Op Subjective: Patient reports feeling better. Foley catheters been removed. He's had one small void with moderately bloody urine. Urine culture negative and blood cultures negative thus far  Objective: Vital signs in last 24 hours: Temp:  [97.8 F (36.6 C)-98.8 F (37.1 C)] 98.6 F (37 C) (07/19 0644) Pulse Rate:  [47-59] 47 (07/19 0644) Resp:  [16] 16 (07/19 0644) BP: (108-129)/(51-61) 129/51 mmHg (07/19 0644) SpO2:  [94 %-96 %] 96 % (07/19 0644)  Intake/Output from previous day: 07/18 0701 - 07/19 0700 In: 1040 [P.O.:240; I.V.:600; IV Piggyback:100] Out: 640 [Urine:640] Intake/Output this shift: Total I/O In: 240 [P.O.:240] Out: 650 [Urine:650]  Physical Exam:  Constitutional: Vital signs reviewed. WD WN in NAD   Eyes: PERRL, No scleral icterus.   Cardiovascular: RRR Pulmonary/Chest: Normal effort Abdominal: Soft. Non-tender, non-distended, bowel sounds are normal, no masses, organomegaly, or guarding present.  Genitourinary: Foley removed Extremities: No cyanosis or edema   Lab Results:  Recent Labs  10/17/14 1738 10/18/14 0350  HGB 12.6* 11.1*  HCT 38.2* 33.4*   BMET  Recent Labs  10/17/14 1738 10/18/14 0350  NA 137 136  K 4.1 3.7  CL 102 103  CO2 26 25  GLUCOSE 154* 127*  BUN 14 15  CREATININE 1.09 1.03  CALCIUM 8.8* 8.3*   No results for input(s): LABPT, INR in the last 72 hours. No results for input(s): LABURIN in the last 72 hours. Results for orders placed or performed during the hospital encounter of 10/17/14  MRSA PCR Screening     Status: None   Collection Time: 10/18/14  2:03 AM  Result Value Ref Range Status   MRSA by PCR NEGATIVE NEGATIVE Final    Comment:        The GeneXpert MRSA Assay (FDA approved for NASAL specimens only), is one component of a comprehensive MRSA colonization surveillance program. It is not intended to diagnose MRSA infection nor to guide or monitor treatment for MRSA infections.   Culture,  Urine     Status: None   Collection Time: 10/18/14  3:27 AM  Result Value Ref Range Status   Specimen Description URINE, CLEAN CATCH  Final   Special Requests NONE  Final   Culture   Final    NO GROWTH 1 DAY Performed at [facility]    Report Status 10/19/2014 FINAL  Final  Clostridium Difficile by PCR (not at [facility])     Status: None   Collection Time: 10/18/14  9:48 AM  Result Value Ref Range Status   C difficile by pcr NEGATIVE NEGATIVE Final    Studies/Results: Ct Abdomen Pelvis Wo Contrast  10/17/2014   CLINICAL DATA:  69 year old male with right flank pain. History of kidney stones.  EXAM: CT ABDOMEN AND PELVIS WITHOUT CONTRAST  TECHNIQUE: Multidetector CT imaging of the abdomen and pelvis was performed following the standard protocol without IV contrast.  COMPARISON:  CT dated 10/11/2014  FINDINGS: Evaluation of this exam is limited in the absence of intravenous contrast.  Partially visualized patchy area of ground-glass opacity at the left lung base posteriorly, compatible with atelectasis versus pneumonia. No intra-abdominal free air or free fluid.  Mild fatty infiltration of the liver. The gallbladder, pancreas, spleen, adrenal glands appear unremarkable. A subcentimeter left renal cortical exophytic lesion is not characterized. There is no hydronephrosis or nephrolithiasis on the left. There appears to be three adjacent stones measuring up to 5 mm and a combined length of 1.2 cm at the right UPJ. There  is mild pelviectasis of the right kidney, similar to prior study. The urinary bladder is predominantly collapsed. The prostate and seminal vesicles are grossly unremarkable. There is scattered sigmoid diverticula without acute inflammatory changes. There is no evidence of bowel obstruction or inflammation. Normal appendix.  There is aortoiliac atherosclerotic disease. There is no lymphadenopathy. There are sclerotic changes of the left iliac bone compatible with Paget's disease.  There is degenerative changes of the spine.  IMPRESSION: Right UPJ calculi with mild right renal pelviectasis, stable from prior study.   Electronically Signed   By: Anner Crete M.D.   On: 10/17/2014 20:35   Dg Abd Acute W/chest  10/17/2014   CLINICAL DATA:  Back pain, fever and chills  EXAM: DG ABDOMEN ACUTE W/ 1V CHEST  COMPARISON:  06/05/2013  FINDINGS: Cardiac shadow is mildly enlarged but stable. The lungs are well aerated bilaterally.  Scattered large and small bowel gas is noted. No free air is seen. The known right proximal ureteral stone is not well visualized on this exam. No other focal abnormality is seen.  IMPRESSION: Nonspecific chest and abdomen. The known proximal right ureteral stone is not well visualized.   Electronically Signed   By: Inez Catalina M.D.   On: 10/17/2014 18:08   US Abdomen Limited Ruq  10/17/2014   CLINICAL DATA:  69 year old male with right upper quadrant abdominal pain  EXAM: US ABDOMEN LIMITED - RIGHT UPPER QUADRANT  COMPARISON:  CT dated 10/17/2014  FINDINGS: Gallbladder:  No gallstones or wall thickening visualized. No sonographic Murphy sign noted.  Common bile duct:  Diameter: 4 ureter the distal portion of the common bile duct is not well visualized.  Liver:  Diffuse hepatic steatosis.  IMPRESSION: Unremarkable gallbladder.  Fatty liver.   Electronically Signed   By: Anner Crete M.D.   On: 10/17/2014 22:03    Assessment/Plan:   Ureteral stone with febrile illness. Urine culture and blood cultures thus far negative. Clinically markedly improved. Afebrile with normal white blood cell count. Can probably be discharged tomorrow with oral antibodies. We'll arrange definitive surgery for 10-14 days from now.   LOS: 2 days   Trenna Kiely S 10/19/2014, 1:30 PM

## 2014-10-19 NOTE — Care Management Important Message (Signed)
Important Message  Patient Details  Name: BON DOWIS MRN: [id number] Date of Birth: 1945-12-02   Medicare Important Message Given:  [facility] notification given    Camillo Flaming 10/19/2014, 11:52 AMImportant Message  Patient Details  Name: BALDWIN RACICOT MRN: [id number] Date of Birth: Feb 26, 1946   Medicare Important Message Given:  Yes-second notification given    Camillo Flaming 10/19/2014, 11:52 AM

## 2014-10-19 NOTE — Progress Notes (Signed)
Pt using his BiPAP equipment from home.  Pt stable and comfortable.  RT will continue to assess as needed.

## 2014-10-19 NOTE — Progress Notes (Addendum)
[facility] PROGRESS NOTE  [name] TDD:[id number] DOB: [birth year] DOA: 10/17/2014 PCP: [name], MD  brief narrative 69 year old male with history of A. fib on anticoagulation, chronic bradycardia, CHF with last EF of 55-60%, after sleep apnea, history of kidney stones status post lithotripsy in April 2016 presented to the ED with persistent nausea and vomiting with right flank pain and subjective fever with chills. In the ED patient found to be in SIRS with fever and hypotension. UA was unremarkable. Chest x-ray negative for any infection. Patient size radiologist recently and had a CT scan done which showed right ureteric stone and patient was discharged on Flomax. Repeat CT of the abdomen done in the ED showed rt UPJ stone with pelviectasis. Patient seen by urology and taken to the OR and underwent cystoscopy with right retrograde pyelogram and right ureteral stent placed.  Assessment/Plan:  SIRS Possibly associated with right UPJ stone and? Pyelonephritis/ left lower lung infiltrate seen on abdominal CT. -On empiric IV Zosyn. Remains afebrile. Urine cultures pending. -Patient underwent cystoscopy with right retrograde pyelogram and right ureteral stent placed . A Foley catheter has been placed as well. -Follow blood cultures and urine cultures. -Appreciate urology recommendations. -Pain control with when necessary morphine.  Paroxysmal A. fib Continue amiodarone.  resumed eliquis after urological procedure.  OSA on BiPAP Resume.  Chronic diastolic CHF Resume Lasix.  Chronic bradycardia Heart rate drops down to 40s during sleep. Asymptomatic. Monitor on telemetry.    Diet: Heart healthy  Code Status: Full code Family Communication: None at bedside Disposition Plan: Possibly discharge home tomorrow if remains afebrile and abdominal pain better.should have final blood and urine C&S by tomorrow as well.    Consultants:  Dr Junious Silk (  urology)  Procedures: cystoscopy with right retrograde pyelogram and right ureteral stent placed on 7/18   Antibiotics:  IV zosyn 7/18--  HPI/Subjective: Patient reports having right flank pain during the night which resolved after receiving morphine.  Objective: Filed Vitals:   10/19/14 0644  BP: 129/51  Pulse: 47  Temp: 98.6 F (37 C)  Resp: 16    Intake/Output Summary (Last 24 hours) at 10/19/14 1304 Last data filed at 10/19/14 0907  Gross per 24 hour  Intake    580 ml  Output   1250 ml  Net   -670 ml   Filed Weights   10/18/14 0047 10/18/14 0500  Weight: 125.011 kg (275 lb 9.6 oz) 125.011 kg (275 lb 9.6 oz)    Exam:   General: no acute distress  HEENT: No pallor, moist oral mucosa, supple neck  Chest: clear  to auscultation bilaterally  CVS: S1 and S2 bradycardic, no murmurs rub or gallop  GI: soft, nondistended, nontender, bowel sounds present, foley in place  musculoskeletal : warm, no edema    Data Reviewed: Basic Metabolic Panel:  Recent Labs Lab 10/17/14 1738 10/18/14 0350  NA 137 136  K 4.1 3.7  CL 102 103  CO2 26 25  GLUCOSE 154* 127*  BUN 14 15  CREATININE 1.09 1.03  CALCIUM 8.8* 8.3*   Liver Function Tests:  Recent Labs Lab 10/17/14 1738 10/18/14 0350  AST 28 20  ALT 21 18  ALKPHOS 102 96  BILITOT 1.3* 1.2  PROT 8.2* 7.3  ALBUMIN 3.7 3.1*    Recent Labs Lab 10/17/14 1738  LIPASE 22   No results for input(s): AMMONIA in the last 168 hours. CBC:  Recent Labs Lab 10/17/14 1738 10/18/14 0350  WBC 8.0 6.4  NEUTROABS  --  4.5  HGB 12.6* 11.1*  HCT 38.2* 33.4*  MCV 89.9 90.0  PLT 253 232   Cardiac Enzymes: No results for input(s): CKTOTAL, CKMB, CKMBINDEX, TROPONINI in the last 168 hours. BNP (last 3 results) No results for input(s): BNP in the last 8760 hours.  ProBNP (last 3 results) No results for input(s): PROBNP in the last 8760 hours.  CBG: No results for input(s): GLUCAP in the last 168  hours.  Recent Results (from the past 240 hour(s))  MRSA PCR Screening     Status: None   Collection Time: 10/18/14  2:03 AM  Result Value Ref Range Status   MRSA by PCR NEGATIVE NEGATIVE Final    Comment:        The GeneXpert MRSA Assay (FDA approved for NASAL specimens only), is one component of a comprehensive MRSA colonization surveillance program. It is not intended to diagnose MRSA infection nor to guide or monitor treatment for MRSA infections.   Culture, Urine     Status: None   Collection Time: 10/18/14  3:27 AM  Result Value Ref Range Status   Specimen Description URINE, CLEAN CATCH  Final   Special Requests NONE  Final   Culture   Final    NO GROWTH 1 DAY Performed at [facility]    Report Status 10/19/2014 FINAL  Final  Clostridium Difficile by PCR (not at [facility])     Status: None   Collection Time: 10/18/14  9:48 AM  Result Value Ref Range Status   C difficile by pcr NEGATIVE NEGATIVE Final     Studies: Ct Abdomen Pelvis Wo Contrast  10/17/2014   CLINICAL DATA:  69 year old male with right flank pain. History of kidney stones.  EXAM: CT ABDOMEN AND PELVIS WITHOUT CONTRAST  TECHNIQUE: Multidetector CT imaging of the abdomen and pelvis was performed following the standard protocol without IV contrast.  COMPARISON:  CT dated 10/11/2014  FINDINGS: Evaluation of this exam is limited in the absence of intravenous contrast.  Partially visualized patchy area of ground-glass opacity at the left lung base posteriorly, compatible with atelectasis versus pneumonia. No intra-abdominal free air or free fluid.  Mild fatty infiltration of the liver. The gallbladder, pancreas, spleen, adrenal glands appear unremarkable. A subcentimeter left renal cortical exophytic lesion is not characterized. There is no hydronephrosis or nephrolithiasis on the left. There appears to be three adjacent stones measuring up to 5 mm and a combined length of 1.2 cm at the right UPJ. There is mild  pelviectasis of the right kidney, similar to prior study. The urinary bladder is predominantly collapsed. The prostate and seminal vesicles are grossly unremarkable. There is scattered sigmoid diverticula without acute inflammatory changes. There is no evidence of bowel obstruction or inflammation. Normal appendix.  There is aortoiliac atherosclerotic disease. There is no lymphadenopathy. There are sclerotic changes of the left iliac bone compatible with Paget's disease. There is degenerative changes of the spine.  IMPRESSION: Right UPJ calculi with mild right renal pelviectasis, stable from prior study.   Electronically Signed   By: Anner Crete M.D.   On: 10/17/2014 20:35   Dg Abd Acute W/chest  10/17/2014   CLINICAL DATA:  Back pain, fever and chills  EXAM: DG ABDOMEN ACUTE W/ 1V CHEST  COMPARISON:  06/05/2013  FINDINGS: Cardiac shadow is mildly enlarged but stable. The lungs are well aerated bilaterally.  Scattered large and small bowel gas is noted. No free air is seen. The known right proximal ureteral  stone is not well visualized on this exam. No other focal abnormality is seen.  IMPRESSION: Nonspecific chest and abdomen. The known proximal right ureteral stone is not well visualized.   Electronically Signed   By: Inez Catalina M.D.   On: 10/17/2014 18:08   US Abdomen Limited Ruq  10/17/2014   CLINICAL DATA:  69 year old male with right upper quadrant abdominal pain  EXAM: US ABDOMEN LIMITED - RIGHT UPPER QUADRANT  COMPARISON:  CT dated 10/17/2014  FINDINGS: Gallbladder:  No gallstones or wall thickening visualized. No sonographic Murphy sign noted.  Common bile duct:  Diameter: 4 ureter the distal portion of the common bile duct is not well visualized.  Liver:  Diffuse hepatic steatosis.  IMPRESSION: Unremarkable gallbladder.  Fatty liver.   Electronically Signed   By: Anner Crete M.D.   On: 10/17/2014 22:03    Scheduled Meds: . amiodarone  200 mg Oral Daily  . apixaban  5 mg Oral BID  .  Apremilast  30 mg Oral BID  . cholecalciferol  5,000 Units Oral Daily  . FLUoxetine  20 mg Oral q morning - 10a  . multivitamin  1 tablet Oral BID  . piperacillin-tazobactam (ZOSYN)  IV  3.375 g Intravenous 3 times per day  . rOPINIRole  1 mg Oral QHS  . sodium chloride  3 mL Intravenous Q12H  . tamsulosin  0.4 mg Oral BID   Continuous Infusions:     Time spent: 25 minutes    [name], [name]  [facility] Pager [phone] If 7PM-7AM, please contact night-coverage at [email or web address], password [facility] 10/19/2014, 1:04 PM  LOS: 2 days

## 2014-10-20 DIAGNOSIS — N201 Calculus of ureter: Secondary | ICD-10-CM

## 2014-10-20 LAB — PROCALCITONIN: Procalcitonin: <0.1 ng/mL

## 2014-10-20 MED ORDER — OXYCODONE HCL 5 MG PO TABS: 5.0000 mg | ORAL_TABLET | ORAL | Status: DC | PRN

## 2014-10-20 MED ORDER — POTASSIUM CITRATE ER 15 MEQ (1620 MG) PO TBCR: 1.0000 | EXTENDED_RELEASE_TABLET | Freq: Every day | ORAL | Status: DC

## 2014-10-20 MED ORDER — AMOXICILLIN-POT CLAVULANATE 875-125 MG PO TABS: 1.0000 | ORAL_TABLET | Freq: Two times a day (BID) | ORAL | Status: DC

## 2014-10-20 MED ORDER — FUROSEMIDE 40 MG PO TABS: 40.0000 mg | ORAL_TABLET | Freq: Every morning | ORAL | Status: DC

## 2014-10-20 NOTE — Progress Notes (Signed)
D/c'd to home via W/C w/ wife voices no c/o

## 2014-10-20 NOTE — Discharge Summary (Signed)
Physician Discharge Summary  [name] VEL:[id number] DOB: [birth year] DOA: 10/17/2014  PCP: [name], MD  Admit date: 10/17/2014 Discharge date: 10/20/2014  Time spent: > 35 minutes  Recommendations for Outpatient Follow-up:  1. Pt to f/u with urology for further evaluation and recommendations.  Discharge Diagnoses:  Principal Problem:   Fever Active Problems:   Paroxysmal atrial fibrillation   Chronic diastolic heart failure   OSA (obstructive sleep apnea)   Pneumonia   Nausea & vomiting   Obstruction of right ureteropelvic junction (UPJ) due to stone   Discharge Condition: stable  Diet recommendation: Regular diet  Filed Weights   10/18/14 0047 10/18/14 0500 10/20/14 0448  Weight: 125.011 kg (275 lb 9.6 oz) 125.011 kg (275 lb 9.6 oz) 123.696 kg (272 lb 11.2 oz)    History of present illness:  From original history of present illness: 69 y.o. male with history of renal stones last lithotripsy done in April this year, paroxysmal atrial fibrillation and chronic bradycardia, CHF last EF measured was 55-60%, obstructive sleep apnea presents to the ER because of persistent nausea vomiting and right flank pain. Patient also has subjective feeling of fever or chills. Denies any chest pain productive cough or shortness of breath. Patient had gone to his urologist 3 days ago and had CAT scan done at his office which as per the patient had shown a right-sided ureteric stone and was told to take increased dose of Flomax. In the ER patient had repeat CT scan done today which showed right ureteric stone with pelvialiectasis. CT scan also showing possible right lower lobe atelectasis versus infiltrates. Patient has been admitted for further management.  Hospital Course:  UTI - Patient will be discharged on Augmentin for 5 more days to complete a seven-day treatment course. -Patient improved initially on empiric IV Zosyn  right UPJ stone  - Patient underwent cystoscopy with right  retrograde pyelogram and right ureteral stent placed  - Urology on board while patient in house. Plans are for patient to follow-up with urologist  Otherwise for known comorbidities please refer to past medical history for details we'll continue home medication regimen.  Procedures: Patient seen by urology and taken to the OR and underwent cystoscopy with right retrograde pyelogram and right ureteral stent placed.  Consultations:  Urology: Dr. Risa Grill  Discharge Exam: Filed Vitals:   10/20/14 0448  BP: 140/48  Pulse: 50  Temp: 97.7 F (36.5 C)  Resp: 16    General: Patient in no acute distress, alert and awake Cardiovascular: Irregularly irregular, no murmurs rubs Respiratory: Clear to auscultation bilaterally, no wheezes, no increased work of breathing  Discharge Instructions   Discharge Instructions    Call MD for:  difficulty breathing, headache or visual disturbances    Complete by:  As directed      Call MD for:  temperature >100.4    Complete by:  As directed      Diet - low sodium heart healthy    Complete by:  As directed      Discharge instructions    Complete by:  As directed   F/u with your urologist as per your discussion with them while in the hospital. Call their office after discharge to confirm f/u appointment date and time.     Increase activity slowly    Complete by:  As directed           Current Discharge Medication List    START taking these medications   Details  amoxicillin-clavulanate (AUGMENTIN)  875-125 MG per tablet Take 1 tablet by mouth 2 (two) times daily. Qty: 10 tablet, Refills: 0    oxyCODONE (OXY IR/ROXICODONE) 5 MG immediate release tablet Take 1 tablet (5 mg total) by mouth every 4 (four) hours as needed for severe pain. Qty: 30 tablet, Refills: 0      CONTINUE these medications which have CHANGED   Details  furosemide (LASIX) 40 MG tablet Take 1 tablet (40 mg total) by mouth every morning. Qty: 90 tablet, Refills: 3    Associated Diagnoses: Paroxysmal atrial fibrillation    Potassium Citrate 15 MEQ (1620 MG) TBCR Take 1 tablet by mouth daily. Qty: 120 tablet      CONTINUE these medications which have NOT CHANGED   Details  amiodarone (PACERONE) 200 MG tablet Take 1 tablet (200 mg total) by mouth daily. Qty: 30 tablet, Refills: 5    Apremilast 30 MG TABS Take 30 mg by mouth 2 (two) times daily.    Cholecalciferol (VITAMIN D3) 5000 UNITS TABS Take 5,000 Units by mouth daily.    ELIQUIS 5 MG TABS tablet Take 5 mg by mouth 2 (two) times daily.     FLUoxetine (PROZAC) 20 MG tablet Take 20 mg by mouth every morning.     fluticasone (FLONASE) 50 MCG/ACT nasal spray Place 2 sprays into both nostrils as needed for rhinitis.     lisinopril (PRINIVIL,ZESTRIL) 2.5 MG tablet Take 1 tablet (2.5 mg total) by mouth daily. Qty: 90 tablet, Refills: 3   Associated Diagnoses: Essential hypertension, benign    Multiple Vitamins-Minerals (PRESERVISION AREDS 2) CAPS Take 1 capsule by mouth 2 (two) times daily.     rOPINIRole (REQUIP) 0.5 MG tablet TAKE TWO TABLETS BY MOUTH EACH NIGHT AFTER DINNER Qty: 60 tablet, Refills: 6    tamsulosin (FLOMAX) 0.4 MG CAPS capsule Take 0.4 mg by mouth 2 (two) times daily.       STOP taking these medications     ibuprofen (ADVIL,MOTRIN) 200 MG tablet      Menthol-Zinc Oxide (GOLD BOND EX)        Allergies  Allergen Reactions  . 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 Zithromax [Azithromycin Dihydrate] Other (See Comments)    Swelling (arms/legs/scotrum)  . Latex Rash  . Amlodipine Other (See Comments)    myalgias   Follow-up Information    Follow up with Bernestine Amass, MD.   Specialty:  Urology   Contact information:   [address] [place] [id number] [phone]        The results of significant diagnostics from this  hospitalization (including imaging, microbiology, ancillary and laboratory) are listed below for reference.    Significant Diagnostic Studies: Ct Abdomen Pelvis Wo Contrast  10/17/2014   CLINICAL DATA:  69 year old male with right flank pain. History of kidney stones.  EXAM: CT ABDOMEN AND PELVIS WITHOUT CONTRAST  TECHNIQUE: Multidetector CT imaging of the abdomen and pelvis was performed following the standard protocol without IV contrast.  COMPARISON:  CT dated 10/11/2014  FINDINGS: Evaluation of this exam is limited in the absence of intravenous contrast.  Partially visualized patchy area of ground-glass opacity at the left lung base posteriorly, compatible with atelectasis versus pneumonia. No intra-abdominal free air or free fluid.  Mild fatty infiltration of the liver. The gallbladder, pancreas, spleen, adrenal glands appear unremarkable. A subcentimeter left renal cortical exophytic lesion  is not characterized. There is no hydronephrosis or nephrolithiasis on the left. There appears to be three adjacent stones measuring up to 5 mm and a combined length of 1.2 cm at the right UPJ. There is mild pelviectasis of the right kidney, similar to prior study. The urinary bladder is predominantly collapsed. The prostate and seminal vesicles are grossly unremarkable. There is scattered sigmoid diverticula without acute inflammatory changes. There is no evidence of bowel obstruction or inflammation. Normal appendix.  There is aortoiliac atherosclerotic disease. There is no lymphadenopathy. There are sclerotic changes of the left iliac bone compatible with Paget's disease. There is degenerative changes of the spine.  IMPRESSION: Right UPJ calculi with mild right renal pelviectasis, stable from prior study.   Electronically Signed   By: Anner Crete M.D.   On: 10/17/2014 20:35   Dg Abd Acute W/chest  10/17/2014   CLINICAL DATA:  Back pain, fever and chills  EXAM: DG ABDOMEN ACUTE W/ 1V CHEST  COMPARISON:   06/05/2013  FINDINGS: Cardiac shadow is mildly enlarged but stable. The lungs are well aerated bilaterally.  Scattered large and small bowel gas is noted. No free air is seen. The known right proximal ureteral stone is not well visualized on this exam. No other focal abnormality is seen.  IMPRESSION: Nonspecific chest and abdomen. The known proximal right ureteral stone is not well visualized.   Electronically Signed   By: Inez Catalina M.D.   On: 10/17/2014 18:08   US Abdomen Limited Ruq  10/17/2014   CLINICAL DATA:  69 year old male with right upper quadrant abdominal pain  EXAM: US ABDOMEN LIMITED - RIGHT UPPER QUADRANT  COMPARISON:  CT dated 10/17/2014  FINDINGS: Gallbladder:  No gallstones or wall thickening visualized. No sonographic Murphy sign noted.  Common bile duct:  Diameter: 4 ureter the distal portion of the common bile duct is not well visualized.  Liver:  Diffuse hepatic steatosis.  IMPRESSION: Unremarkable gallbladder.  Fatty liver.   Electronically Signed   By: Anner Crete M.D.   On: 10/17/2014 22:03    Microbiology: Recent Results (from the past 240 hour(s))  Culture, blood (routine x 2)     Status: None (Preliminary result)   Collection Time: 10/17/14 11:32 PM  Result Value Ref Range Status   Specimen Description BLOOD RIGHT HAND  Final   Special Requests BOTTLES DRAWN AEROBIC AND ANAEROBIC 3.5ML  Final   Culture   Final    NO GROWTH 1 DAY Performed at [facility]    Report Status PENDING  Incomplete  Culture, blood (routine x 2)     Status: None (Preliminary result)   Collection Time: 10/17/14 11:39 PM  Result Value Ref Range Status   Specimen Description BLOOD RIGHT ANTECUBITAL  Final   Special Requests BOTTLES DRAWN AEROBIC AND ANAEROBIC 3.5ML  Final   Culture   Final    NO GROWTH 1 DAY Performed at [facility]    Report Status PENDING  Incomplete  MRSA PCR Screening     Status: None   Collection Time: 10/18/14  2:03 AM  Result Value Ref Range  Status   MRSA by PCR NEGATIVE NEGATIVE Final    Comment:        The GeneXpert MRSA Assay (FDA approved for NASAL specimens only), is one component of a comprehensive MRSA colonization surveillance program. It is not intended to diagnose MRSA infection nor to guide or monitor treatment for MRSA infections.   Culture, Urine  Status: None   Collection Time: 10/18/14  3:27 AM  Result Value Ref Range Status   Specimen Description URINE, CLEAN CATCH  Final   Special Requests NONE  Final   Culture   Final    NO GROWTH 1 DAY Performed at [facility]    Report Status 10/19/2014 FINAL  Final  Clostridium Difficile by PCR (not at [facility])     Status: None   Collection Time: 10/18/14  9:48 AM  Result Value Ref Range Status   C difficile by pcr NEGATIVE NEGATIVE Final     Labs: Basic Metabolic Panel:  Recent Labs Lab 10/17/14 1738 10/18/14 0350  NA 137 136  K 4.1 3.7  CL 102 103  CO2 26 25  GLUCOSE 154* 127*  BUN 14 15  CREATININE 1.09 1.03  CALCIUM 8.8* 8.3*   Liver Function Tests:  Recent Labs Lab 10/17/14 1738 10/18/14 0350  AST 28 20  ALT 21 18  ALKPHOS 102 96  BILITOT 1.3* 1.2  PROT 8.2* 7.3  ALBUMIN 3.7 3.1*    Recent Labs Lab 10/17/14 1738  LIPASE 22   No results for input(s): AMMONIA in the last 168 hours. CBC:  Recent Labs Lab 10/17/14 1738 10/18/14 0350  WBC 8.0 6.4  NEUTROABS  --  4.5  HGB 12.6* 11.1*  HCT 38.2* 33.4*  MCV 89.9 90.0  PLT 253 232   Cardiac Enzymes: No results for input(s): CKTOTAL, CKMB, CKMBINDEX, TROPONINI in the last 168 hours. BNP: BNP (last 3 results) No results for input(s): BNP in the last 8760 hours.  ProBNP (last 3 results) No results for input(s): PROBNP in the last 8760 hours.  CBG: No results for input(s): GLUCAP in the last 168 hours.     Signed:  Velvet Bathe  [facility] 10/20/2014, 10:45 AM

## 2014-10-21 ENCOUNTER — Telehealth: Payer: Self-pay | Admitting: *Deleted

## 2014-10-21 NOTE — Telephone Encounter (Signed)
Transition Care Management Follow-up Telephone Call   Date discharged? 10/20/14   How have you been since you were released from the hospital? Doing well other than pain related to kidney stones   Do you understand why you were in the hospital? yes   Do you understand the discharge instructions? yes   Where were you discharged to? Home   Items Reviewed:  Medications reviewed: yes  Allergies reviewed: yes  Dietary changes reviewed: n/a  Referrals reviewed: yes, urology-scheduled   Functional Questionnaire:   Activities of Daily Living (ADLs):   He states they are independent in the following: ambulation, bathing and hygiene, feeding, continence, grooming, toileting and dressing States they require assistance with the following: None   Any transportation issues/concerns?: no   Any patient concerns? no   Confirmed importance and date/time of follow-up visits scheduled yes, patient reports he will call back to scheduled f/u  Provider Appointment booked with n/a (will call back to schedule)  Confirmed with patient if condition begins to worsen call PCP or go to the ER.  Patient was given the office number and encouraged to call back with question or concerns.  : yes

## 2014-10-21 NOTE — Telephone Encounter (Signed)
Transitional Care call attempted.  Left message for patient to return call. 

## 2014-10-23 LAB — CULTURE, BLOOD (ROUTINE X 2)
Culture: NO GROWTH
Culture: NO GROWTH

## 2014-10-29 ENCOUNTER — Other Ambulatory Visit: Payer: Self-pay | Admitting: Urology

## 2014-11-02 ENCOUNTER — Encounter (HOSPITAL_COMMUNITY): Payer: Self-pay | Admitting: *Deleted

## 2014-11-03 ENCOUNTER — Encounter (HOSPITAL_COMMUNITY)
Admission: RE | Disposition: A | Discharge status: Home / Self Care | Payer: Self-pay | Source: Ambulatory Visit | Attending: Urology

## 2014-11-03 ENCOUNTER — Encounter (HOSPITAL_COMMUNITY): Payer: Self-pay | Admitting: *Deleted

## 2014-11-03 ENCOUNTER — Ambulatory Visit (HOSPITAL_COMMUNITY): Payer: Medicare Other | Admitting: Anesthesiology

## 2014-11-03 ENCOUNTER — Ambulatory Visit (HOSPITAL_COMMUNITY)
Admission: RE | Admit: 2014-11-03 | Discharge: 2014-11-03 | Disposition: A | Discharge status: Home / Self Care | Payer: Medicare Other | Source: Ambulatory Visit | Attending: Urology | Admitting: Urology

## 2014-11-03 DIAGNOSIS — Z7901 Long term (current) use of anticoagulants: Secondary | ICD-10-CM | POA: Insufficient documentation

## 2014-11-03 DIAGNOSIS — E119 Type 2 diabetes mellitus without complications: Secondary | ICD-10-CM | POA: Diagnosis not present

## 2014-11-03 DIAGNOSIS — N201 Calculus of ureter: Secondary | ICD-10-CM | POA: Diagnosis not present

## 2014-11-03 DIAGNOSIS — Z87891 Personal history of nicotine dependence: Secondary | ICD-10-CM | POA: Insufficient documentation

## 2014-11-03 DIAGNOSIS — I10 Essential (primary) hypertension: Secondary | ICD-10-CM | POA: Insufficient documentation

## 2014-11-03 DIAGNOSIS — N200 Calculus of kidney: Secondary | ICD-10-CM | POA: Diagnosis not present

## 2014-11-03 DIAGNOSIS — I5032 Chronic diastolic (congestive) heart failure: Secondary | ICD-10-CM | POA: Insufficient documentation

## 2014-11-03 DIAGNOSIS — I4891 Unspecified atrial fibrillation: Secondary | ICD-10-CM | POA: Diagnosis not present

## 2014-11-03 HISTORY — PX: CYSTOSCOPY WITH URETEROSCOPY AND STENT PLACEMENT: SHX6377

## 2014-11-03 SURGERY — CYSTOURETEROSCOPY, WITH STENT INSERTION
Anesthesia: General | Laterality: Right

## 2014-11-03 MED ORDER — PROPOFOL 10 MG/ML IV BOLUS
INTRAVENOUS | Status: AC
  Filled 2014-11-03: qty 20

## 2014-11-03 MED ORDER — DEXAMETHASONE SODIUM PHOSPHATE 10 MG/ML IJ SOLN
INTRAMUSCULAR | Status: AC
  Filled 2014-11-03: qty 1

## 2014-11-03 MED ORDER — PROMETHAZINE HCL 25 MG/ML IJ SOLN: 6.2500 mg | INTRAMUSCULAR | Status: DC | PRN

## 2014-11-03 MED ORDER — LIDOCAINE HCL (CARDIAC) 20 MG/ML IV SOLN
INTRAVENOUS | Status: AC
  Filled 2014-11-03: qty 5

## 2014-11-03 MED ORDER — SODIUM CHLORIDE 0.9 % IR SOLN
Status: DC | PRN
  Administered 2014-11-03: 4000 mL

## 2014-11-03 MED ORDER — EPHEDRINE SULFATE 50 MG/ML IJ SOLN
INTRAMUSCULAR | Status: DC | PRN
  Administered 2014-11-03: 5 mg via INTRAVENOUS
  Administered 2014-11-03 (×2): 10 mg via INTRAVENOUS

## 2014-11-03 MED ORDER — GLYCOPYRROLATE 0.2 MG/ML IJ SOLN
INTRAMUSCULAR | Status: DC | PRN
  Administered 2014-11-03: 0.2 mg via INTRAVENOUS

## 2014-11-03 MED ORDER — LIDOCAINE HCL 2 % EX GEL
CUTANEOUS | Status: DC | PRN
  Administered 2014-11-03: 1 via URETHRAL

## 2014-11-03 MED ORDER — LACTATED RINGERS IV SOLN
INTRAVENOUS | Status: DC | PRN
  Administered 2014-11-03: 08:00:00 via INTRAVENOUS

## 2014-11-03 MED ORDER — LIDOCAINE HCL (CARDIAC) 20 MG/ML IV SOLN
INTRAVENOUS | Status: DC | PRN
  Administered 2014-11-03: 50 mg via INTRAVENOUS

## 2014-11-03 MED ORDER — LIDOCAINE HCL 2 % EX GEL
CUTANEOUS | Status: AC
  Filled 2014-11-03: qty 10

## 2014-11-03 MED ORDER — DEXTROSE 5 % IV SOLN
INTRAVENOUS | Status: AC
  Filled 2014-11-03: qty 2

## 2014-11-03 MED ORDER — FENTANYL CITRATE (PF) 100 MCG/2ML IJ SOLN
INTRAMUSCULAR | Status: AC
  Filled 2014-11-03: qty 4

## 2014-11-03 MED ORDER — FENTANYL CITRATE (PF) 100 MCG/2ML IJ SOLN: 25.0000 ug | INTRAMUSCULAR | Status: DC | PRN

## 2014-11-03 MED ORDER — FENTANYL CITRATE (PF) 100 MCG/2ML IJ SOLN
INTRAMUSCULAR | Status: DC | PRN
  Administered 2014-11-03 (×2): 25 ug via INTRAVENOUS
  Administered 2014-11-03: 50 ug via INTRAVENOUS

## 2014-11-03 MED ORDER — DEXTROSE 5 % IV SOLN
2.0000 g | INTRAVENOUS | Status: AC
  Administered 2014-11-03: 2 g via INTRAVENOUS

## 2014-11-03 MED ORDER — MEPERIDINE HCL 50 MG/ML IJ SOLN: 6.2500 mg | INTRAMUSCULAR | Status: DC | PRN

## 2014-11-03 MED ORDER — EPHEDRINE SULFATE 50 MG/ML IJ SOLN
INTRAMUSCULAR | Status: AC
  Filled 2014-11-03: qty 1

## 2014-11-03 MED ORDER — SODIUM CHLORIDE 0.9 % IJ SOLN
INTRAMUSCULAR | Status: AC
  Filled 2014-11-03: qty 10

## 2014-11-03 MED ORDER — ONDANSETRON HCL 4 MG/2ML IJ SOLN
INTRAMUSCULAR | Status: AC
  Filled 2014-11-03: qty 2

## 2014-11-03 MED ORDER — PROPOFOL 10 MG/ML IV BOLUS
INTRAVENOUS | Status: DC | PRN
  Administered 2014-11-03: 160 mg via INTRAVENOUS

## 2014-11-03 MED ORDER — MIDAZOLAM HCL 2 MG/2ML IJ SOLN
INTRAMUSCULAR | Status: AC
  Filled 2014-11-03: qty 4

## 2014-11-03 MED ORDER — MIDAZOLAM HCL 5 MG/5ML IJ SOLN
INTRAMUSCULAR | Status: DC | PRN
  Administered 2014-11-03 (×2): 1 mg via INTRAVENOUS

## 2014-11-03 SURGICAL SUPPLY — 17 items
BAG URINE DRAINAGE (UROLOGICAL SUPPLIES) ×2 IMPLANT
BASKET ZERO TIP NITINOL 2.4FR (BASKET) ×2 IMPLANT
CATH INTERMIT  6FR 70CM (CATHETERS) ×2 IMPLANT
CLOTH BEACON ORANGE TIMEOUT ST (SAFETY) ×2 IMPLANT
FIBER LASER FLEXIVA 1000 (UROLOGICAL SUPPLIES) IMPLANT
FIBER LASER FLEXIVA 200 (UROLOGICAL SUPPLIES) IMPLANT
FIBER LASER FLEXIVA 365 (UROLOGICAL SUPPLIES) IMPLANT
FIBER LASER FLEXIVA 550 (UROLOGICAL SUPPLIES) IMPLANT
FIBER LASER TRAC TIP (UROLOGICAL SUPPLIES) IMPLANT
GLOVE BIOGEL M STRL SZ7.5 (GLOVE) ×6 IMPLANT
GOWN STRL REUS W/TWL XL LVL3 (GOWN DISPOSABLE) ×4 IMPLANT
GUIDEWIRE .038 (WIRE) IMPLANT
GUIDEWIRE ANG ZIPWIRE 038X150 (WIRE) ×2 IMPLANT
GUIDEWIRE STR DUAL SENSOR (WIRE) ×2 IMPLANT
IV NS IRRIG 3000ML ARTHROMATIC (IV SOLUTION) IMPLANT
PACK CYSTO (CUSTOM PROCEDURE TRAY) ×2 IMPLANT
SHEATH ACCESS URETERAL 38CM (SHEATH) ×2 IMPLANT

## 2014-11-03 NOTE — Transfer of Care (Signed)
Immediate Anesthesia Transfer of Care Note  Patient: [name]  Procedure(s) Performed: Procedure(s): CYSTOSCOPY WITH RIGHT URETEROSCOPY AND  REMOVAL OF JJ STENT    (Right)  Patient Location: PACU  Anesthesia Type:General  Level of Consciousness: awake, alert  and oriented  Airway & Oxygen Therapy: Patient Spontanous Breathing and Patient connected to face mask oxygen  Post-op Assessment: Report given to RN and Post -op Vital signs reviewed and stable  Post vital signs: Reviewed and stable  Last Vitals:  Filed Vitals:   11/03/14 0706  BP: 126/70  Pulse: 67  Temp: 36.4 C  Resp: 18    Complications: No apparent anesthesia complications

## 2014-11-03 NOTE — Anesthesia Procedure Notes (Signed)
Procedure Name: LMA Insertion Date/Time: 11/03/2014 9:24 AM Performed by: Glory Buff Pre-anesthesia Checklist: Patient identified, Emergency Drugs available, Suction available, Patient being monitored and Timeout performed Patient Re-evaluated:Patient Re-evaluated prior to inductionOxygen Delivery Method: Circle system utilized Preoxygenation: Pre-oxygenation with 100% oxygen Intubation Type: IV induction LMA: LMA inserted LMA Size: 4.0 Number of attempts: 1 Placement Confirmation: positive ETCO2 Tube secured with: Tape

## 2014-11-03 NOTE — Op Note (Signed)
Preoperative diagnosis: Right renal calculus with prior history of obstruction and urosepsis Postoperative diagnosis: Same  Procedure: Cystoscopy, removal of right double-J stent, flexible ureteroscopy, basketing of stone   Surgeon: Bernestine Amass M.D.  Anesthesia: Gen.  Indications: Patient has a history of nephrolithiasis. He presented with a febrile urinary tract infection and a 5 mm UPJ stone. He had an urgent stent placed. He quickly improved clinically and was discharged. He presents now for definitive treatment of his renal calculus.     Technique and findings: Patient was brought to the operating room where he had successful induction of general anesthesia. He was placed in lithotomy position prepped and draped in usual manner. Appropriate surgical timeout was performed. He received 2 g of Rocephin perioperatively. Cystoscopy revealed a right double-J stent in good position. Stent was partially extracted and then a guidewire was fed up the stent to the right renal pelvis. A flexible ureteroscopic access sheath was then placed to just beneath the ureteropelvic junction. A digital flexible ureteroscope was then utilized. Within the lower pole was a 4 x 6 mm stone. This was basket extracted without difficulty. No other stones were noted on repeat flexible ureteroscopy. The access sheath was removed and the patient was brought to PACU in stable condition. Office complications occurred.

## 2014-11-03 NOTE — Interval H&P Note (Signed)
History and Physical Interval Note:  11/03/2014 9:13 AM  [name]  has presented today for surgery, with the diagnosis of RIGHT RENAL URETERAL PELVIC JUNCTION STONE  The various methods of treatment have been discussed with the patient and family. After consideration of risks, benefits and other options for treatment, the patient has consented to  Procedure(s): CYSTOSCOPY WITH RIGHT URETEROSCOPY AND  REMOVAL OF JJ STENT    (Right) CYSTOSCOPY WITH HOLMIUM LASER LITHOTRIPSY (Right) as a surgical intervention .  The patient's history has been reviewed, patient examined, no change in status, stable for surgery.  I have reviewed the patient's chart and labs.  Questions were answered to the patient's satisfaction.     Assata Juncaj S
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
## 2014-11-03 NOTE — Anesthesia Postprocedure Evaluation (Signed)
  Anesthesia Post-op Note  Patient: [name]  Procedure(s) Performed: Procedure(s) (LRB): CYSTOSCOPY WITH RIGHT URETEROSCOPY AND  REMOVAL OF JJ STENT    (Right)  Patient Location: PACU  Anesthesia Type: General  Level of Consciousness: awake and alert   Airway and Oxygen Therapy: Patient Spontanous Breathing  Post-op Pain: mild  Post-op Assessment: Post-op Vital signs reviewed, Patient's Cardiovascular Status Stable, Respiratory Function Stable, Patent Airway and No signs of Nausea or vomiting  Last Vitals:  Filed Vitals:   11/03/14 1015  BP: 132/64  Pulse: 73  Temp:   Resp: 9    Post-op Vital Signs: stable   Complications: No apparent anesthesia complications

## 2014-11-03 NOTE — Discharge Instructions (Signed)
[facility] [phone] Post Ureteroscopy With or Without Stent Instructions  Definitions:  Ureter: The duct that transports urine from the kidney to the bladder. Stent:   A plastic hollow tube that is placed into the ureter, from the kidney to the                 bladder to prevent the ureter from swelling shut.  GENERAL INSTRUCTIONS:  Despite the fact that no skin incisions were used, the area around the ureter and bladder is raw and irritated. The stent is a foreign body which will further irritate the bladder wall. This irritation is manifested by increased frequency of urination, both day and night, and by an increase in the urge to urinate. In some, the urge to urinate is present almost always. Sometimes the urge is strong enough that you may not be able to stop yourself from urinating. The only real cure is to remove the stent and then give time for the bladder wall to heal which can't be done until the danger of the ureter swelling shut has passed, which varies.  You may see some blood in your urine while the stent is in place and a few days afterwards. Do not be alarmed, even if the urine was clear for a while. Get off your feet and drink lots of fluids until clearing occurs. If you start to pass clots or don't improve, call us.  DIET: You may return to your normal diet immediately. Because of the raw surface of your bladder, alcohol, spicy foods, acid type foods and drinks with caffeine may cause irritation or frequency and should be used in moderation. To keep your urine flowing freely and to avoid constipation, drink plenty of fluids during the day ( 8-10 glasses ). Tip: Avoid cranberry juice because it is very acidic.  ACTIVITY: Your physical activity doesn't need to be restricted. However, if you are very active, you may see some blood in your urine. We suggest that you reduce your activity under these circumstances until the bleeding has stopped.  BOWELS: It is  important to keep your bowels regular during the postoperative period. Straining with bowel movements can cause bleeding. A bowel movement every other day is reasonable. Use a mild laxative if needed, such as Milk of Magnesia 2-3 tablespoons, or 2 Dulcolax tablets. Call if you continue to have problems. If you have been taking narcotics for pain, before, during or after your surgery, you may be constipated. Take a laxative if necessary.   MEDICATION: You should resume your pre-surgery medications unless told not to. In addition you will often be given an antibiotic to prevent infection. These should be taken as prescribed until the bottles are finished unless you are having an unusual reaction to one of the drugs.  PROBLEMS YOU SHOULD REPORT TO [place]:  Fevers over 100.5 Fahrenheit.  Heavy bleeding, or clots ( See above notes about blood in urine ).  Inability to urinate.  Drug reactions ( hives, rash, nausea, vomiting, diarrhea ).  Severe burning or pain with urination that is not improving.  FOLLOW-UP: You will need a follow-up appointment to monitor your progress. Call for this appointment at the number listed above. Usually the first appointment will be about 2-4 weeks after your surgery.

## 2014-11-03 NOTE — Anesthesia Preprocedure Evaluation (Addendum)
Anesthesia Evaluation  Patient identified by MRN, date of birth, ID band Patient awake    Reviewed: Allergy & Precautions, NPO status , Patient's Chart, lab work & pertinent test results  History of Anesthesia Complications (+) PONV  Airway Mallampati: II  TM Distance: >3 FB Neck ROM: Full    Dental no notable dental hx.    Pulmonary sleep apnea and Continuous Positive Airway Pressure Ventilation , former smoker,  breath sounds clear to auscultation  Pulmonary exam normal       Cardiovascular hypertension, Pt. on medications Normal cardiovascular exam+ dysrhythmias Atrial Fibrillation and Ventricular Tachycardia Rhythm:Regular Rate:Normal     Neuro/Psych negative neurological ROS  negative psych ROS   GI/Hepatic negative GI ROS, Neg liver ROS,   Endo/Other  diabetes, Type 2Morbid obesity  Renal/GU negative Renal ROS  negative genitourinary   Musculoskeletal negative musculoskeletal ROS (+)   Abdominal   Peds negative pediatric ROS (+)  Hematology negative hematology ROS (+)   Anesthesia Other Findings   Reproductive/Obstetrics negative OB ROS                            Anesthesia Physical  Anesthesia Plan  ASA: III  Anesthesia Plan: General   Post-op Pain Management:    Induction: Intravenous  Airway Management Planned: LMA  Additional Equipment:   Intra-op Plan:   Post-operative Plan: Extubation in OR  Informed Consent: I have reviewed the patients History and Physical, chart, labs and discussed the procedure including the risks, benefits and alternatives for the proposed anesthesia with the patient or authorized representative who has indicated his/her understanding and acceptance.   Dental advisory given  Plan Discussed with: CRNA  Anesthesia Plan Comments:         Anesthesia Quick Evaluation

## 2014-11-03 NOTE — Op Note (Deleted)
Preoperative diagnosis: Chronic suprapubic abscess Postoperative diagnosis: Same  Procedure: Excision of chronically indurated inflamed suprapubic skin and underlying subcutaneous tissue   Surgeon: Bernestine Amass M.D.  Anesthesia: Mac with local infiltration Indications: Haney has had recurrent small skin abscess above his suprapubic region. This been treated with Vicente Males biotics but is not previously been cultured. He continues to have an area of ongoing induration with occasional drainage. There is been no gross evidence of fluctuance but he does have an area of chronically indurated and inflamed tissue measuring approximately 2 x 1 cm within the suprapubic region just above the base of the penis. This area seems to get less indurated with doxycycline but then recurs after elimination of the antibody. He currently has some mild to moderate induration underneath the skin in this location. He is requested a more definitive excision of this region to try to reduce this chronicity of intermittent inflammation and drainage.     Technique and findings: Patient was brought the operating room. He was placed in the supine position and had IV sedation with monitoring. He was prepped and draped in usual manner. An elliptical incision was made measuring approximately 3 x 1 cm. The skin and underlying subcutaneous tissue was all excised. There was no evidence of an obvious abscess but the underlying fat did show significant induration. We did not feel pathologic analysis would ultimately change our management. The area was copiously irrigated. A prior to the incision a was infiltrated with a mixture of quarter percent Marcaine and 1% lidocaine. After copious irrigation the subcutaneous tissues were reapproximated with interrupted Vicryls suture. Skin was reapproximated with a 3-0 nylon horizontal mattress suture. She was taken to PACU in stable condition. No obvious complications.

## 2014-11-04 ENCOUNTER — Encounter (HOSPITAL_COMMUNITY): Payer: Self-pay | Admitting: Urology

## 2014-11-09 DIAGNOSIS — H3531 Nonexudative age-related macular degeneration: Secondary | ICD-10-CM | POA: Diagnosis not present

## 2014-11-09 LAB — HM DIABETES EYE EXAM

## 2014-11-10 ENCOUNTER — Ambulatory Visit (INDEPENDENT_AMBULATORY_CARE_PROVIDER_SITE_OTHER): Payer: Medicare Other | Admitting: Family Medicine

## 2014-11-10 ENCOUNTER — Encounter: Payer: Self-pay | Admitting: Family Medicine

## 2014-11-10 VITALS — BP 124/84 | HR 68 | Temp 97.6°F | Wt 274.0 lb

## 2014-11-10 DIAGNOSIS — R319 Hematuria, unspecified: Secondary | ICD-10-CM

## 2014-11-10 NOTE — Patient Instructions (Signed)
Take care.  Glad to see you.  I'll await the follow up from Dr. Risa Grill.  Don't change your meds for now.

## 2014-11-10 NOTE — Assessment & Plan Note (Signed)
Clearly improved after treatment for stone.  Awaiting uro input on stone analysis. With his current fluid status- at baseline and not edematous and not lightheaded- we didn't change any meds today.  Continue as is.  He agrees.

## 2014-11-10 NOTE — Progress Notes (Signed)
Pre visit review using our clinic review tool, if applicable. No additional management support is needed unless otherwise documented below in the visit note.  Possible UTI, with concurrent renal stone.  He was stented and had the stone removed.  He has no extra hardware now, the stent and stone are both out.  He feels much better now.  3rd stone event.  Stone analysis pending per urology.    No CP, not SOB, no BLE.  Slightly lightheaded at the time of the illness, but this is resolved now.   He has some hemorrhoid irritation recently, still on eliquis.  No other bleeding.   Urine is back to normal, clear.    Meds, vitals, and allergies reviewed.   ROS: See HPI.  Otherwise, noncontributory.  GEN: nad, alert and oriented HEENT: mucous membranes moist NECK: supple w/o LA CV: rrr. PULM: ctab, no inc wob ABD: soft, +bs EXT: no edema SKIN: no acute rash

## 2014-11-23 ENCOUNTER — Encounter: Payer: Self-pay | Admitting: Family Medicine

## 2014-12-07 DIAGNOSIS — Z79899 Other long term (current) drug therapy: Secondary | ICD-10-CM | POA: Diagnosis not present

## 2014-12-07 DIAGNOSIS — N400 Enlarged prostate without lower urinary tract symptoms: Secondary | ICD-10-CM | POA: Diagnosis not present

## 2014-12-07 DIAGNOSIS — N200 Calculus of kidney: Secondary | ICD-10-CM | POA: Diagnosis not present

## 2014-12-23 DIAGNOSIS — M25562 Pain in left knee: Secondary | ICD-10-CM | POA: Diagnosis not present

## 2014-12-30 DIAGNOSIS — N200 Calculus of kidney: Secondary | ICD-10-CM | POA: Diagnosis not present

## 2014-12-31 DIAGNOSIS — N200 Calculus of kidney: Secondary | ICD-10-CM | POA: Diagnosis not present

## 2015-01-06 DIAGNOSIS — G4733 Obstructive sleep apnea (adult) (pediatric): Secondary | ICD-10-CM | POA: Diagnosis not present

## 2015-01-14 ENCOUNTER — Other Ambulatory Visit: Payer: Self-pay | Admitting: Family Medicine

## 2015-01-14 DIAGNOSIS — R739 Hyperglycemia, unspecified: Secondary | ICD-10-CM

## 2015-01-21 ENCOUNTER — Other Ambulatory Visit (INDEPENDENT_AMBULATORY_CARE_PROVIDER_SITE_OTHER): Payer: Medicare Other

## 2015-01-21 DIAGNOSIS — R739 Hyperglycemia, unspecified: Secondary | ICD-10-CM | POA: Diagnosis not present

## 2015-01-21 LAB — BASIC METABOLIC PANEL
BUN: 21 mg/dL (ref 6–23)
CALCIUM: 10.1 mg/dL (ref 8.4–10.5)
CHLORIDE: 106 meq/L (ref 96–112)
CO2: 26 meq/L (ref 19–32)
Creatinine, Ser: 0.94 mg/dL (ref 0.40–1.50)
GFR: 84.7 mL/min (ref >60.00)
Glucose, Bld: 204 mg/dL — ABNORMAL HIGH (ref 70–99)
Potassium: 4.4 mEq/L (ref 3.5–5.1)
Sodium: 139 mEq/L (ref 135–145)

## 2015-01-21 LAB — HEMOGLOBIN A1C: Hgb A1c MFr Bld: 6.9 % — ABNORMAL HIGH (ref 4.6–6.5)

## 2015-01-27 ENCOUNTER — Encounter: Payer: Self-pay | Admitting: Family Medicine

## 2015-01-27 ENCOUNTER — Ambulatory Visit (INDEPENDENT_AMBULATORY_CARE_PROVIDER_SITE_OTHER): Payer: Medicare Other | Admitting: Family Medicine

## 2015-01-27 VITALS — BP 140/62 | HR 64 | Temp 98.1°F | Wt 286.6 lb

## 2015-01-27 DIAGNOSIS — Z23 Encounter for immunization: Secondary | ICD-10-CM | POA: Diagnosis not present

## 2015-01-27 DIAGNOSIS — Z119 Encounter for screening for infectious and parasitic diseases, unspecified: Secondary | ICD-10-CM

## 2015-01-27 DIAGNOSIS — E119 Type 2 diabetes mellitus without complications: Secondary | ICD-10-CM

## 2015-01-27 NOTE — Patient Instructions (Signed)
Recheck A1c in about 3 months, work on your diet and sugar in the meantime.  Take care.  Glad to see you.  Check on a tetanus shot in the meantime.

## 2015-01-27 NOTE — Progress Notes (Signed)
Pt opts in for HCV screening.  D/w pt re: routine screening with next set of labs.  His weight is up slightly.  He is compliant with meds and working to get his weight down.  He occ uses extra dose of lasix.    Diabetes:  No meds Hypoglycemic episodes: rarely, only if prolonged fasting.   Hyperglycemic episodes:no sx Feet problems: occ tingling in the feet B.   Blood Sugars averaging:not checked eye exam within last year:yes A1c up, but still at goal, <7.  D/w pt. D/w pt about diet and exercise.  Encouraged both at tolerated.  He needs less sweets.    Meds, vitals, and allergies reviewed.   ROS: See HPI.  Otherwise negative.    GEN: nad, alert and oriented HEENT: mucous membranes moist NECK: supple w/o LA CV: rrr. PULM: ctab, no inc wob ABD: soft, +bs EXT: no edema SKIN: no acute rash  Diabetic foot exam: Normal inspection No skin breakdown No calluses  Normal DP pulses Normal sensation to light touch and monofilament Nails normal

## 2015-01-27 NOTE — Assessment & Plan Note (Signed)
A1c up, but still at goal, <7.  D/w pt. D/w pt about diet and exercise.  Encouraged both at tolerated.  He needs less sweets.   No new meds.  Recheck in about 3 months.

## 2015-02-08 DIAGNOSIS — N200 Calculus of kidney: Secondary | ICD-10-CM | POA: Diagnosis not present

## 2015-02-16 DIAGNOSIS — N200 Calculus of kidney: Secondary | ICD-10-CM | POA: Diagnosis not present

## 2015-02-21 ENCOUNTER — Telehealth: Payer: Self-pay | Admitting: Family Medicine

## 2015-02-23 DIAGNOSIS — Z79899 Other long term (current) drug therapy: Secondary | ICD-10-CM | POA: Diagnosis not present

## 2015-02-28 DIAGNOSIS — L408 Other psoriasis: Secondary | ICD-10-CM | POA: Diagnosis not present

## 2015-02-28 DIAGNOSIS — M170 Bilateral primary osteoarthritis of knee: Secondary | ICD-10-CM | POA: Diagnosis not present

## 2015-02-28 DIAGNOSIS — M7552 Bursitis of left shoulder: Secondary | ICD-10-CM | POA: Diagnosis not present

## 2015-02-28 DIAGNOSIS — Z09 Encounter for follow-up examination after completed treatment for conditions other than malignant neoplasm: Secondary | ICD-10-CM | POA: Diagnosis not present

## 2015-03-07 ENCOUNTER — Other Ambulatory Visit: Payer: Self-pay | Admitting: *Deleted

## 2015-03-07 ENCOUNTER — Telehealth: Payer: Self-pay | Admitting: Pulmonary Disease

## 2015-03-07 MED ORDER — ROPINIROLE HCL 0.5 MG PO TABS: ORAL_TABLET | ORAL | Status: DC

## 2015-03-07 NOTE — Telephone Encounter (Signed)
That is fine to refill x 1 until I can see him  Keep ov follow up

## 2015-03-07 NOTE — Telephone Encounter (Signed)
Spoke with pt and is aware refill sent in. Nothing further needed

## 2015-03-07 NOTE — Telephone Encounter (Signed)
Pt is scheduled for follow up with TP on 04/11/15 (former [place] pt). Pt needs refill on requip. Last refilled 07/20/14 #60 x 6 refills TAKE TWO TABLETS BY MOUTH EACH NIGHT AFTER DINNER   Please advise TP if okay to refill? thanks

## 2015-03-14 ENCOUNTER — Other Ambulatory Visit: Payer: Self-pay | Admitting: Internal Medicine

## 2015-04-05 NOTE — Telephone Encounter (Signed)
Error

## 2015-04-11 ENCOUNTER — Ambulatory Visit (INDEPENDENT_AMBULATORY_CARE_PROVIDER_SITE_OTHER): Payer: Medicare HMO | Admitting: Adult Health

## 2015-04-11 ENCOUNTER — Encounter: Payer: Self-pay | Admitting: Adult Health

## 2015-04-11 VITALS — BP 134/68 | HR 62 | Temp 97.7°F | Ht 69.0 in | Wt 293.0 lb

## 2015-04-11 DIAGNOSIS — G4733 Obstructive sleep apnea (adult) (pediatric): Secondary | ICD-10-CM | POA: Diagnosis not present

## 2015-04-11 DIAGNOSIS — G2581 Restless legs syndrome: Secondary | ICD-10-CM

## 2015-04-11 MED ORDER — ROPINIROLE HCL 0.5 MG PO TABS: ORAL_TABLET | ORAL | Status: DC

## 2015-04-11 NOTE — Assessment & Plan Note (Signed)
Severe OSA with excellent control on BIPAP  Plan  Continue on BIPAP At bedtime   Work on weight loss.  Do not drive if sleepy.  Continue on Requip At bedtime   Use saline nasal rinses As needed   May use Saline nasal gel At bedtime   follow up Dr. Elsworth Soho  In 1 year and As needed

## 2015-04-11 NOTE — Patient Instructions (Signed)
Continue on BIPAP At bedtime   Work on weight loss.  Do not drive if sleepy.  Continue on Requip At bedtime   Use saline nasal rinses As needed   May use Saline nasal gel At bedtime   follow up Dr. Elsworth Soho  In 1 year and As needed
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
## 2015-04-11 NOTE — Assessment & Plan Note (Signed)
Good control on Requip

## 2015-04-11 NOTE — Addendum Note (Signed)
Addended by: Osa Craver on: 04/11/2015 02:33 PM   Modules accepted: Orders

## 2015-04-12 NOTE — Progress Notes (Signed)
Reviewed & agree with plan  

## 2015-04-15 ENCOUNTER — Ambulatory Visit (INDEPENDENT_AMBULATORY_CARE_PROVIDER_SITE_OTHER): Payer: Medicare HMO | Admitting: Internal Medicine

## 2015-04-15 ENCOUNTER — Encounter: Payer: Self-pay | Admitting: Internal Medicine

## 2015-04-15 VITALS — BP 124/78 | HR 57 | Ht 69.0 in | Wt 296.0 lb

## 2015-04-15 DIAGNOSIS — I480 Paroxysmal atrial fibrillation: Secondary | ICD-10-CM | POA: Diagnosis not present

## 2015-04-15 DIAGNOSIS — I5032 Chronic diastolic (congestive) heart failure: Secondary | ICD-10-CM | POA: Diagnosis not present

## 2015-04-15 DIAGNOSIS — I10 Essential (primary) hypertension: Secondary | ICD-10-CM

## 2015-04-15 DIAGNOSIS — I472 Ventricular tachycardia: Secondary | ICD-10-CM

## 2015-04-15 DIAGNOSIS — I4729 Other ventricular tachycardia: Secondary | ICD-10-CM

## 2015-04-15 MED ORDER — ELIQUIS 5 MG PO TABS: 5.0000 mg | ORAL_TABLET | Freq: Two times a day (BID) | ORAL | Status: DC

## 2015-04-15 MED ORDER — FUROSEMIDE 40 MG PO TABS: 40.0000 mg | ORAL_TABLET | Freq: Every morning | ORAL | Status: DC

## 2015-04-15 MED ORDER — LISINOPRIL 2.5 MG PO TABS: 2.5000 mg | ORAL_TABLET | Freq: Every day | ORAL | Status: DC

## 2015-04-15 MED ORDER — AMIODARONE HCL 200 MG PO TABS: 200.0000 mg | ORAL_TABLET | Freq: Every day | ORAL | Status: DC

## 2015-04-15 MED ORDER — POTASSIUM CITRATE ER 15 MEQ (1620 MG) PO TBCR: 1.0000 | EXTENDED_RELEASE_TABLET | Freq: Every day | ORAL | Status: DC

## 2015-04-15 NOTE — Assessment & Plan Note (Signed)
His symptoms are class 2. He is encouraged to lose weight, continue his current meds and maintain a low sodium diet.

## 2015-04-15 NOTE — Assessment & Plan Note (Signed)
He is maintaining NSR very nicely. No change in his meds.

## 2015-04-15 NOTE — Assessment & Plan Note (Signed)
He has not had more symptomatic VT. Will follow. Continue amiodarone.

## 2015-04-15 NOTE — Assessment & Plan Note (Signed)
His blood pressure is well controlled. Today. He will continue his current meds and maintain a low sodium diet and he is encouraged to lose weight.

## 2015-04-15 NOTE — Patient Instructions (Signed)
Medication Instructions:  Your physician recommends that you continue on your current medications as directed. Please refer to the Current Medication list given to you today.   Labwork: none  Testing/Procedures: none  Follow-Up: Your physician wants you to follow-up in: 12 months with Dr. Taylor. You will receive a reminder letter in the mail two months in advance. If you don't receive a letter, please call our office to schedule the follow-up appointment.   Any Other Special Instructions Will Be Listed Below (If Applicable).     If you need a refill on your cardiac medications before your next appointment, please call your pharmacy.   
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
## 2015-04-29 ENCOUNTER — Other Ambulatory Visit (INDEPENDENT_AMBULATORY_CARE_PROVIDER_SITE_OTHER): Payer: Medicare HMO

## 2015-04-29 ENCOUNTER — Other Ambulatory Visit: Payer: Self-pay | Admitting: Family Medicine

## 2015-04-29 DIAGNOSIS — Z119 Encounter for screening for infectious and parasitic diseases, unspecified: Secondary | ICD-10-CM

## 2015-04-29 DIAGNOSIS — E119 Type 2 diabetes mellitus without complications: Secondary | ICD-10-CM

## 2015-04-29 LAB — HEMOGLOBIN A1C: HEMOGLOBIN A1C: 7 % — AB (ref 4.6–6.5)

## 2015-04-30 LAB — HEPATITIS C ANTIBODY: HCV AB: NEGATIVE

## 2015-05-03 ENCOUNTER — Telehealth: Payer: Self-pay | Admitting: Family Medicine

## 2015-05-03 NOTE — Telephone Encounter (Signed)
Phoned patient

## 2015-05-03 NOTE — Telephone Encounter (Signed)
Pt returned CMA call.  Best number to call is [phone]

## 2015-05-11 ENCOUNTER — Encounter: Payer: Self-pay | Admitting: Family Medicine

## 2015-05-11 ENCOUNTER — Ambulatory Visit (INDEPENDENT_AMBULATORY_CARE_PROVIDER_SITE_OTHER): Payer: Medicare HMO | Admitting: Family Medicine

## 2015-05-11 VITALS — BP 132/68 | HR 59 | Temp 98.5°F | Wt 295.8 lb

## 2015-05-11 DIAGNOSIS — J0100 Acute maxillary sinusitis, unspecified: Secondary | ICD-10-CM

## 2015-05-11 MED ORDER — AMOXICILLIN-POT CLAVULANATE 875-125 MG PO TABS: 1.0000 | ORAL_TABLET | Freq: Two times a day (BID) | ORAL | Status: DC

## 2015-05-11 NOTE — Progress Notes (Signed)
Pre visit review using our clinic review tool, if applicable. No additional management support is needed unless otherwise documented below in the visit note.  Sx started about 4 days ago.  First noted congestion then pressure and pain, tooth pain, maxillary pain, HA.  No FCNAVD.  No cough.  Sx is mainly from the throat up.    He has R knee OA and is going to ortho at some point.  The f/u is pending with Dr. Maureen Ralphs.    Meds, vitals, and allergies reviewed.   ROS: See HPI.  Otherwise, noncontributory.  GEN: nad, alert and oriented HEENT: mucous membranes moist, tm w/o erythema, nasal exam w/o erythema, clear discharge noted,  OP with cobblestoning, max sinuses ttp B NECK: supple w/o LA CV: rrr.   PULM: ctab, no inc wob EXT: no edema

## 2015-05-11 NOTE — Patient Instructions (Signed)
Start augmentin.  Rest and fluids.  Update me as needed.  Take care.  Glad to see you.

## 2015-05-12 DIAGNOSIS — J0100 Acute maxillary sinusitis, unspecified: Secondary | ICD-10-CM | POA: Insufficient documentation

## 2015-05-12 NOTE — Assessment & Plan Note (Signed)
Nontoxic, start augmentin, supportive care o/w.  He agrees. Okay for outpatient f/u.

## 2015-05-20 ENCOUNTER — Encounter: Payer: Self-pay | Admitting: Adult Health

## 2015-07-31 DIAGNOSIS — N201 Calculus of ureter: Secondary | ICD-10-CM | POA: Insufficient documentation

## 2015-08-10 ENCOUNTER — Telehealth: Payer: Self-pay | Admitting: Internal Medicine

## 2015-08-10 NOTE — Telephone Encounter (Signed)
No bridging needed, hold Eliquis 2 days prior to procedure.  Restart when bleeding risk acceptable.   Okay to proceed with surgery.

## 2015-08-10 NOTE — Telephone Encounter (Signed)
Request for surgical clearance:  1. What type of surgery is being performed? Total Knee Arthroplasty  2. When is this surgery scheduled? 5/31   3. Are there any medications that need to be held prior to surgery and how long?N/A  4. Name of physician performing surgery? Dr. Skip Estimable   5. What is your office phone and fax number? [phone]  Tiffany just needs documentation stating that the pt is cleared for surgery.  6.

## 2015-08-17 ENCOUNTER — Ambulatory Visit
Admission: RE | Admit: 2015-08-17 | Discharge: 2015-08-17 | Disposition: A | Discharge status: Home / Self Care | Payer: Medicare HMO | Source: Ambulatory Visit | Attending: Orthopedic Surgery | Admitting: Orthopedic Surgery

## 2015-08-17 DIAGNOSIS — I10 Essential (primary) hypertension: Secondary | ICD-10-CM | POA: Diagnosis not present

## 2015-08-17 DIAGNOSIS — I4891 Unspecified atrial fibrillation: Secondary | ICD-10-CM | POA: Diagnosis not present

## 2015-08-17 DIAGNOSIS — M25562 Pain in left knee: Secondary | ICD-10-CM | POA: Insufficient documentation

## 2015-08-17 DIAGNOSIS — Z79899 Other long term (current) drug therapy: Secondary | ICD-10-CM | POA: Diagnosis not present

## 2015-08-17 DIAGNOSIS — M25561 Pain in right knee: Secondary | ICD-10-CM | POA: Diagnosis not present

## 2015-08-17 DIAGNOSIS — I509 Heart failure, unspecified: Secondary | ICD-10-CM | POA: Insufficient documentation

## 2015-08-17 DIAGNOSIS — Z87891 Personal history of nicotine dependence: Secondary | ICD-10-CM | POA: Diagnosis not present

## 2015-08-17 DIAGNOSIS — Z01812 Encounter for preprocedural laboratory examination: Secondary | ICD-10-CM | POA: Diagnosis present

## 2015-08-17 HISTORY — DX: Anxiety disorder, unspecified: F41.9

## 2015-08-17 LAB — COMPREHENSIVE METABOLIC PANEL
ALT: 17 U/L (ref 17–63)
AST: 18 U/L (ref 15–41)
Albumin: 4 g/dL (ref 3.5–5.0)
Alkaline Phosphatase: 106 U/L (ref 38–126)
Anion gap: 8 (ref 5–15)
BUN: 17 mg/dL (ref 6–20)
CHLORIDE: 105 mmol/L (ref 101–111)
CO2: 25 mmol/L (ref 22–32)
Calcium: 9.2 mg/dL (ref 8.9–10.3)
Creatinine, Ser: 0.77 mg/dL (ref 0.61–1.24)
GFR calc Af Amer: >60 mL/min (ref >60)
GFR calc non Af Amer: >60 mL/min (ref >60)
GLUCOSE: 209 mg/dL — AB (ref 65–99)
POTASSIUM: 3.2 mmol/L — AB (ref 3.5–5.1)
SODIUM: 138 mmol/L (ref 135–145)
Total Bilirubin: 1.4 mg/dL — ABNORMAL HIGH (ref 0.3–1.2)
Total Protein: 7.7 g/dL (ref 6.5–8.1)

## 2015-08-17 LAB — SURGICAL PCR SCREEN
MRSA, PCR: POSITIVE — AB
STAPHYLOCOCCUS AUREUS: POSITIVE — AB

## 2015-08-17 LAB — CBC
HCT: 36.9 % — ABNORMAL LOW (ref 40.0–52.0)
Hemoglobin: 12.7 g/dL — ABNORMAL LOW (ref 13.0–18.0)
MCH: 30.3 pg (ref 26.0–34.0)
MCHC: 34.4 g/dL (ref 32.0–36.0)
MCV: 88.3 fL (ref 80.0–100.0)
PLATELETS: 225 10*3/uL (ref 150–440)
RBC: 4.18 MIL/uL — AB (ref 4.40–5.90)
RDW: 14.2 % (ref 11.5–14.5)
WBC: 9.1 10*3/uL (ref 3.8–10.6)

## 2015-08-17 LAB — URINALYSIS COMPLETE WITH MICROSCOPIC (ARMC ONLY)
BACTERIA UA: NONE SEEN
Bilirubin Urine: NEGATIVE
Glucose, UA: 50 mg/dL — AB
Hgb urine dipstick: NEGATIVE
KETONES UR: NEGATIVE mg/dL
Leukocytes, UA: NEGATIVE
NITRITE: NEGATIVE
PROTEIN: NEGATIVE mg/dL
Specific Gravity, Urine: 1.011 (ref 1.005–1.030)
pH: 5 (ref 5.0–8.0)

## 2015-08-17 LAB — SEDIMENTATION RATE: SED RATE: 45 mm/h — AB (ref 0–20)

## 2015-08-17 LAB — APTT: aPTT: 33 seconds (ref 24–36)

## 2015-08-17 LAB — ABO/RH: ABO/RH(D): O POS

## 2015-08-17 LAB — PROTIME-INR
INR: 1.17
Prothrombin Time: 15.1 seconds — ABNORMAL HIGH (ref 11.4–15.0)

## 2015-08-17 LAB — TYPE AND SCREEN
ABO/RH(D): O POS
ANTIBODY SCREEN: NEGATIVE

## 2015-08-17 LAB — HEMOGLOBIN A1C: HEMOGLOBIN A1C: 6.6 % — AB (ref 4.0–6.0)

## 2015-08-17 NOTE — Patient Instructions (Signed)
  Your procedure is scheduled on: Aug 31, 2015 (Wednesday) Report to Same Day Surgery 2nd floor [facility] To find out your arrival time please call [phone] between 1PM - 3PM on  Aug 30, 2015 (Tuesday)  Remember: Instructions that are not followed completely may result in serious medical risk, up to and including death, or upon the discretion of your surgeon and anesthesiologist your surgery may need to be rescheduled.    _x___ 1. Do not eat food or drink liquids after midnight. No gum chewing or hard candies.     __x__ 2. No Alcohol for 24 hours before or after surgery.   ____ 3. Bring all medications with you on the day of surgery if instructed.    __x__ 4. Notify your doctor if there is any change in your medical condition     (cold, fever, infections).     Do not wear jewelry, make-up, hairpins, clips or nail polish.  Do not wear lotions, powders, or perfumes. You may wear deodorant.  Do not shave 48 hours prior to surgery. Men may shave face and neck.  Do not bring valuables to the hospital.    [facility] is not responsible for any belongings or valuables.               Contacts, dentures or bridgework may not be worn into surgery.  Leave your suitcase in the car. After surgery it may be brought to your room.  For patients admitted to the hospital, discharge time is determined by your treatment team.   Patients discharged the day of surgery will not be allowed to drive home.    Please read over the following fact sheets that you were given:   [facility] Preparing for Surgery and or MRSA Information   _x___ Take these medicines the morning of surgery with A SIP OF WATER:    1. Amiodarone  2.  3.  4.  5.  6.  ____ Fleet Enema (as directed)   _x___ Use CHG Soap or sage wipes as directed on instruction sheet   ____ Use inhalers on the day of surgery and bring to hospital day of surgery  ____ Stop metformin 2 days prior to surgery    ____ Take 1/2 of usual  insulin dose the night before surgery and none on the morning of  surgery            __x__ Stop aspirin or coumadin, or plavix (STOP ELIQUIS ON MAY 29)  _x__ Stop Anti-inflammatories such as Advil, Aleve, Ibuprofen, Motrin, Naproxen,          Naprosyn, Goodies powders or aspirin products. Ok to take Tylenol.   __x__ Stop supplements until after surgery.  (STOP PRESERVISION NOW)  __x__ Bring BI-Pap to the hospital.

## 2015-08-18 NOTE — Pre-Procedure Instructions (Signed)
MRSA, Met C, ESR, UA, and HgbA1C results called and faxed to Dr. Marry Guan office.

## 2015-08-18 NOTE — Pre-Procedure Instructions (Addendum)
CLEARANCE BY DR Carleene Overlie TAYLOR CARDIOLOGIST ON CHART

## 2015-08-19 LAB — URINE CULTURE
CULTURE: NO GROWTH
Special Requests: NORMAL

## 2015-08-20 LAB — LATEX, IGE: Latex: <0.1 kU/L

## 2015-08-22 ENCOUNTER — Other Ambulatory Visit: Payer: Self-pay | Admitting: Family Medicine

## 2015-08-22 ENCOUNTER — Telehealth: Payer: Self-pay

## 2015-08-22 DIAGNOSIS — E876 Hypokalemia: Secondary | ICD-10-CM

## 2015-08-22 DIAGNOSIS — I10 Essential (primary) hypertension: Secondary | ICD-10-CM

## 2015-08-22 MED ORDER — POTASSIUM CHLORIDE CRYS ER 20 MEQ PO TBCR: 20.0000 meq | EXTENDED_RELEASE_TABLET | Freq: Every day | ORAL | Status: DC

## 2015-08-22 NOTE — Telephone Encounter (Signed)
Patient advised.   Rx sent to pharmacy.  Lab appt scheduled.

## 2015-08-22 NOTE — Telephone Encounter (Signed)
See the hard copy.  Thanks.

## 2015-08-22 NOTE — Telephone Encounter (Signed)
Pt left v/m; pt is scheduled for knee replacement 08/31/15; pt had preop blood test and Potassium was low; Dr Marry Guan was to fax copy of labs on 08/19/15; pt request cb to see if labs received and is med going to be sent to pharmacy. Pt last seen 01/27/15. [facility] [address].

## 2015-08-23 MED ORDER — FAMOTIDINE 20 MG PO TABS
ORAL_TABLET | ORAL | Status: AC
  Filled 2015-08-23: qty 1

## 2015-08-23 MED ORDER — CLINDAMYCIN PHOSPHATE 600 MG/50ML IV SOLN
INTRAVENOUS | Status: AC
  Filled 2015-08-23: qty 50

## 2015-08-26 ENCOUNTER — Other Ambulatory Visit (INDEPENDENT_AMBULATORY_CARE_PROVIDER_SITE_OTHER): Payer: Medicare HMO

## 2015-08-26 DIAGNOSIS — I10 Essential (primary) hypertension: Secondary | ICD-10-CM

## 2015-08-26 DIAGNOSIS — E876 Hypokalemia: Secondary | ICD-10-CM | POA: Diagnosis not present

## 2015-08-26 LAB — LIPID PANEL
CHOLESTEROL: 178 mg/dL (ref 0–200)
HDL: 28.9 mg/dL — ABNORMAL LOW (ref >39.00)
LDL CALC: 115 mg/dL — AB (ref 0–99)
NonHDL: 149.52
Total CHOL/HDL Ratio: 6
Triglycerides: 174 mg/dL — ABNORMAL HIGH (ref 0.0–149.0)
VLDL: 34.8 mg/dL (ref 0.0–40.0)

## 2015-08-26 LAB — POTASSIUM: POTASSIUM: 4.1 meq/L (ref 3.5–5.1)

## 2015-08-29 ENCOUNTER — Other Ambulatory Visit: Payer: Self-pay | Admitting: Family Medicine

## 2015-08-29 DIAGNOSIS — E785 Hyperlipidemia, unspecified: Secondary | ICD-10-CM

## 2015-08-29 MED ORDER — ROSUVASTATIN CALCIUM 10 MG PO TABS: 10.0000 mg | ORAL_TABLET | Freq: Every day | ORAL | Status: DC

## 2015-08-30 ENCOUNTER — Other Ambulatory Visit: Payer: Medicare HMO

## 2015-08-31 ENCOUNTER — Inpatient Hospital Stay: Payer: Medicare HMO | Admitting: Anesthesiology

## 2015-08-31 ENCOUNTER — Encounter: Payer: Self-pay | Admitting: Orthopedic Surgery

## 2015-08-31 ENCOUNTER — Inpatient Hospital Stay
Admission: RE | Admit: 2015-08-31 | Discharge: 2015-09-02 | DRG: 470 | Disposition: A | Discharge status: Home Health | Payer: Medicare HMO | Source: Ambulatory Visit | Attending: Orthopedic Surgery | Admitting: Orthopedic Surgery

## 2015-08-31 ENCOUNTER — Encounter
Admission: RE | Disposition: A | Discharge status: Home Health | Payer: Self-pay | Source: Ambulatory Visit | Attending: Orthopedic Surgery

## 2015-08-31 ENCOUNTER — Inpatient Hospital Stay: Payer: Medicare HMO

## 2015-08-31 DIAGNOSIS — M1711 Unilateral primary osteoarthritis, right knee: Secondary | ICD-10-CM | POA: Diagnosis present

## 2015-08-31 DIAGNOSIS — I4891 Unspecified atrial fibrillation: Secondary | ICD-10-CM | POA: Diagnosis present

## 2015-08-31 DIAGNOSIS — L4050 Arthropathic psoriasis, unspecified: Secondary | ICD-10-CM | POA: Diagnosis present

## 2015-08-31 DIAGNOSIS — Z6841 Body Mass Index (BMI) 40.0 and over, adult: Secondary | ICD-10-CM | POA: Diagnosis not present

## 2015-08-31 DIAGNOSIS — Z91040 Latex allergy status: Secondary | ICD-10-CM

## 2015-08-31 DIAGNOSIS — I130 Hypertensive heart and chronic kidney disease with heart failure and stage 1 through stage 4 chronic kidney disease, or unspecified chronic kidney disease: Secondary | ICD-10-CM | POA: Diagnosis present

## 2015-08-31 DIAGNOSIS — I5032 Chronic diastolic (congestive) heart failure: Secondary | ICD-10-CM | POA: Diagnosis present

## 2015-08-31 DIAGNOSIS — Z881 Allergy status to other antibiotic agents status: Secondary | ICD-10-CM

## 2015-08-31 DIAGNOSIS — G2581 Restless legs syndrome: Secondary | ICD-10-CM | POA: Diagnosis present

## 2015-08-31 DIAGNOSIS — G4733 Obstructive sleep apnea (adult) (pediatric): Secondary | ICD-10-CM | POA: Diagnosis present

## 2015-08-31 DIAGNOSIS — Z96659 Presence of unspecified artificial knee joint: Secondary | ICD-10-CM

## 2015-08-31 HISTORY — PX: KNEE ARTHROPLASTY: SHX992

## 2015-08-31 LAB — GLUCOSE, CAPILLARY
GLUCOSE-CAPILLARY: 153 mg/dL — AB (ref 65–99)
Glucose-Capillary: 152 mg/dL — ABNORMAL HIGH (ref 65–99)

## 2015-08-31 LAB — POCT I-STAT 4, (NA,K, GLUC, HGB,HCT)
Glucose, Bld: 163 mg/dL — ABNORMAL HIGH (ref 65–99)
HCT: 37 % — ABNORMAL LOW (ref 39.0–52.0)
Hemoglobin: 12.6 g/dL — ABNORMAL LOW (ref 13.0–17.0)
Potassium: 4 mmol/L (ref 3.5–5.1)
Sodium: 144 mmol/L (ref 135–145)

## 2015-08-31 SURGERY — ARTHROPLASTY, KNEE, TOTAL, USING IMAGELESS COMPUTER-ASSISTED NAVIGATION
Anesthesia: Spinal | Site: Knee | Laterality: Right | Wound class: Clean

## 2015-08-31 MED ORDER — NEOMYCIN-POLYMYXIN B GU 40-200000 IR SOLN
Status: DC | PRN
  Administered 2015-08-31: 14 mL

## 2015-08-31 MED ORDER — PSEUDOEPHEDRINE HCL 30 MG PO TABS
30.0000 mg | ORAL_TABLET | Freq: Three times a day (TID) | ORAL | Status: DC | PRN
  Administered 2015-09-01: 30 mg via ORAL
  Filled 2015-08-31 (×2): qty 1

## 2015-08-31 MED ORDER — FLEET ENEMA 7-19 GM/118ML RE ENEM: 1.0000 | ENEMA | Freq: Once | RECTAL | Status: DC | PRN

## 2015-08-31 MED ORDER — LORATADINE 10 MG PO TABS
10.0000 mg | ORAL_TABLET | Freq: Every day | ORAL | Status: DC
  Administered 2015-09-01 – 2015-09-02 (×2): 10 mg via ORAL
  Filled 2015-08-31 (×2): qty 1

## 2015-08-31 MED ORDER — POTASSIUM CHLORIDE CRYS ER 20 MEQ PO TBCR
20.0000 meq | EXTENDED_RELEASE_TABLET | Freq: Every day | ORAL | Status: DC
  Administered 2015-08-31 – 2015-09-02 (×3): 20 meq via ORAL
  Filled 2015-08-31 (×3): qty 1

## 2015-08-31 MED ORDER — CALCIUM CITRATE-VITAMIN D 500-400 MG-UNIT PO CHEW
1.0000 | CHEWABLE_TABLET | Freq: Every day | ORAL | Status: DC
  Administered 2015-09-01 – 2015-09-02 (×2): 1 via ORAL
  Filled 2015-08-31 (×2): qty 1

## 2015-08-31 MED ORDER — BUPIVACAINE-EPINEPHRINE 0.25% -1:200000 IJ SOLN
INTRAMUSCULAR | Status: DC | PRN
  Administered 2015-08-31: 30 mL

## 2015-08-31 MED ORDER — LORATADINE-PSEUDOEPHEDRINE ER 10-240 MG PO TB24: 1.0000 | ORAL_TABLET | Freq: Every day | ORAL | Status: DC

## 2015-08-31 MED ORDER — TAMSULOSIN HCL 0.4 MG PO CAPS
0.4000 mg | ORAL_CAPSULE | Freq: Every day | ORAL | Status: DC
  Administered 2015-08-31 – 2015-09-01 (×2): 0.4 mg via ORAL
  Filled 2015-08-31 (×2): qty 1

## 2015-08-31 MED ORDER — TETRACAINE HCL 1 % IJ SOLN
INTRAMUSCULAR | Status: DC | PRN
  Administered 2015-08-31: 10 mg via INTRASPINAL

## 2015-08-31 MED ORDER — MORPHINE SULFATE (PF) 2 MG/ML IV SOLN
2.0000 mg | INTRAVENOUS | Status: DC | PRN
  Administered 2015-08-31: 2 mg via INTRAVENOUS
  Filled 2015-08-31: qty 1

## 2015-08-31 MED ORDER — PROPOFOL 500 MG/50ML IV EMUL
INTRAVENOUS | Status: DC | PRN
  Administered 2015-08-31: 100 ug/kg/min via INTRAVENOUS

## 2015-08-31 MED ORDER — OXYCODONE HCL 5 MG PO TABS
5.0000 mg | ORAL_TABLET | ORAL | Status: DC | PRN
  Administered 2015-08-31 – 2015-09-01 (×6): 10 mg via ORAL
  Filled 2015-08-31 (×7): qty 2

## 2015-08-31 MED ORDER — ACETAMINOPHEN 10 MG/ML IV SOLN
INTRAVENOUS | Status: DC | PRN
  Administered 2015-08-31: 1000 mg via INTRAVENOUS

## 2015-08-31 MED ORDER — BUPIVACAINE LIPOSOME 1.3 % IJ SUSP
INTRAMUSCULAR | Status: AC
Start: 2015-08-31 — End: 2015-08-31
  Filled 2015-08-31: qty 20

## 2015-08-31 MED ORDER — ACETAMINOPHEN 325 MG PO TABS
650.0000 mg | ORAL_TABLET | Freq: Four times a day (QID) | ORAL | Status: DC | PRN
  Filled 2015-08-31 (×2): qty 2

## 2015-08-31 MED ORDER — ACETAMINOPHEN 10 MG/ML IV SOLN
1000.0000 mg | Freq: Four times a day (QID) | INTRAVENOUS | Status: AC
  Administered 2015-08-31 – 2015-09-01 (×4): 1000 mg via INTRAVENOUS
  Filled 2015-08-31 (×5): qty 100

## 2015-08-31 MED ORDER — ACETAMINOPHEN 10 MG/ML IV SOLN
INTRAVENOUS | Status: AC
  Filled 2015-08-31: qty 100

## 2015-08-31 MED ORDER — BUPIVACAINE LIPOSOME 1.3 % IJ SUSP
INTRAMUSCULAR | Status: DC | PRN
  Administered 2015-08-31: 60 mL

## 2015-08-31 MED ORDER — SODIUM CHLORIDE 0.9 % IV SOLN
INTRAVENOUS | Status: DC
  Administered 2015-08-31 (×4): via INTRAVENOUS

## 2015-08-31 MED ORDER — TRAMADOL HCL 50 MG PO TABS
50.0000 mg | ORAL_TABLET | ORAL | Status: DC | PRN
  Administered 2015-08-31 – 2015-09-01 (×3): 100 mg via ORAL
  Filled 2015-08-31 (×2): qty 2
  Filled 2015-08-31: qty 1

## 2015-08-31 MED ORDER — AMIODARONE HCL 200 MG PO TABS
200.0000 mg | ORAL_TABLET | Freq: Every day | ORAL | Status: DC
  Administered 2015-09-01 – 2015-09-02 (×2): 200 mg via ORAL
  Filled 2015-08-31 (×2): qty 1

## 2015-08-31 MED ORDER — FAMOTIDINE 20 MG PO TABS
ORAL_TABLET | ORAL | Status: AC
  Administered 2015-08-31: 20 mg via ORAL
  Filled 2015-08-31: qty 1

## 2015-08-31 MED ORDER — ONDANSETRON HCL 4 MG/2ML IJ SOLN
4.0000 mg | Freq: Once | INTRAMUSCULAR | Status: AC | PRN
  Administered 2015-08-31: 4 mg via INTRAVENOUS

## 2015-08-31 MED ORDER — VANCOMYCIN HCL IN DEXTROSE 1-5 GM/200ML-% IV SOLN
INTRAVENOUS | Status: AC
  Administered 2015-08-31: 1000 mg via INTRAVENOUS
  Filled 2015-08-31: qty 200

## 2015-08-31 MED ORDER — MENTHOL 3 MG MT LOZG: 1.0000 | LOZENGE | OROMUCOSAL | Status: DC | PRN

## 2015-08-31 MED ORDER — BUPIVACAINE-EPINEPHRINE (PF) 0.25% -1:200000 IJ SOLN
INTRAMUSCULAR | Status: AC
  Filled 2015-08-31: qty 30

## 2015-08-31 MED ORDER — BUPIVACAINE HCL (PF) 0.5 % IJ SOLN
INTRAMUSCULAR | Status: DC | PRN
  Administered 2015-08-31: 2.5 mL

## 2015-08-31 MED ORDER — FENTANYL CITRATE (PF) 100 MCG/2ML IJ SOLN
25.0000 ug | INTRAMUSCULAR | Status: DC | PRN
  Administered 2015-08-31 (×3): 50 ug via INTRAVENOUS

## 2015-08-31 MED ORDER — NEOMYCIN-POLYMYXIN B GU 40-200000 IR SOLN
Status: AC
  Filled 2015-08-31: qty 20

## 2015-08-31 MED ORDER — KETAMINE HCL 50 MG/ML IJ SOLN
INTRAMUSCULAR | Status: DC | PRN
  Administered 2015-08-31: 50 mg via INTRAMUSCULAR

## 2015-08-31 MED ORDER — BISACODYL 10 MG RE SUPP
10.0000 mg | Freq: Every day | RECTAL | Status: DC | PRN
  Administered 2015-09-02: 10 mg via RECTAL
  Filled 2015-08-31 (×2): qty 1

## 2015-08-31 MED ORDER — FERROUS SULFATE 325 (65 FE) MG PO TABS
325.0000 mg | ORAL_TABLET | Freq: Two times a day (BID) | ORAL | Status: DC
  Administered 2015-08-31 – 2015-09-02 (×3): 325 mg via ORAL
  Filled 2015-08-31 (×3): qty 1

## 2015-08-31 MED ORDER — FENTANYL CITRATE (PF) 100 MCG/2ML IJ SOLN
INTRAMUSCULAR | Status: AC
Start: 2015-08-31 — End: 2015-09-01
  Filled 2015-08-31: qty 2

## 2015-08-31 MED ORDER — ACETAMINOPHEN 650 MG RE SUPP: 650.0000 mg | Freq: Four times a day (QID) | RECTAL | Status: DC | PRN

## 2015-08-31 MED ORDER — ROPINIROLE HCL 0.25 MG PO TABS
1.0000 mg | ORAL_TABLET | Freq: Every day | ORAL | Status: DC
  Administered 2015-08-31 – 2015-09-01 (×2): 1 mg via ORAL
  Filled 2015-08-31 (×2): qty 4

## 2015-08-31 MED ORDER — DEXTROSE 5 % IV SOLN
3.0000 g | INTRAVENOUS | Status: AC
  Administered 2015-08-31: 3 g via INTRAVENOUS
  Filled 2015-08-31: qty 3000

## 2015-08-31 MED ORDER — CALCIUM CITRATE 950 (200 CA) MG PO TABS: 200.0000 mg | ORAL_TABLET | Freq: Every day | ORAL | Status: DC

## 2015-08-31 MED ORDER — SODIUM CHLORIDE 0.9 % IJ SOLN
INTRAMUSCULAR | Status: AC
  Filled 2015-08-31: qty 50

## 2015-08-31 MED ORDER — GLYCOPYRROLATE 0.2 MG/ML IJ SOLN
INTRAMUSCULAR | Status: DC | PRN
  Administered 2015-08-31 (×2): 0.2 mg via INTRAVENOUS

## 2015-08-31 MED ORDER — CELECOXIB 200 MG PO CAPS
200.0000 mg | ORAL_CAPSULE | Freq: Two times a day (BID) | ORAL | Status: DC
Start: 2015-08-31 — End: 2015-09-02
  Filled 2015-08-31 (×3): qty 1

## 2015-08-31 MED ORDER — CHLORHEXIDINE GLUCONATE 4 % EX LIQD: 60.0000 mL | Freq: Once | CUTANEOUS | Status: DC

## 2015-08-31 MED ORDER — PROPOFOL 10 MG/ML IV BOLUS
INTRAVENOUS | Status: DC | PRN
  Administered 2015-08-31: 50 mg via INTRAVENOUS

## 2015-08-31 MED ORDER — MORPHINE SULFATE (PF) 2 MG/ML IV SOLN
INTRAVENOUS | Status: AC
  Administered 2015-08-31: 2 mg via INTRAMUSCULAR
  Filled 2015-08-31: qty 1

## 2015-08-31 MED ORDER — ROSUVASTATIN CALCIUM 10 MG PO TABS
10.0000 mg | ORAL_TABLET | Freq: Every day | ORAL | Status: DC
  Administered 2015-08-31 – 2015-09-02 (×3): 10 mg via ORAL
  Filled 2015-08-31 (×3): qty 1

## 2015-08-31 MED ORDER — FUROSEMIDE 40 MG PO TABS
40.0000 mg | ORAL_TABLET | Freq: Every morning | ORAL | Status: DC
  Administered 2015-09-01 – 2015-09-02 (×2): 40 mg via ORAL
  Filled 2015-08-31 (×2): qty 1

## 2015-08-31 MED ORDER — ONDANSETRON HCL 4 MG/2ML IJ SOLN: 4.0000 mg | Freq: Four times a day (QID) | INTRAMUSCULAR | Status: DC | PRN

## 2015-08-31 MED ORDER — LISINOPRIL 5 MG PO TABS
2.5000 mg | ORAL_TABLET | Freq: Every day | ORAL | Status: DC
  Administered 2015-08-31 – 2015-09-01 (×3): 2.5 mg via ORAL
  Filled 2015-08-31 (×3): qty 1

## 2015-08-31 MED ORDER — DIPHENHYDRAMINE HCL 12.5 MG/5ML PO ELIX
12.5000 mg | ORAL_SOLUTION | ORAL | Status: DC | PRN
  Filled 2015-08-31: qty 10

## 2015-08-31 MED ORDER — PHENOL 1.4 % MT LIQD: 1.0000 | OROMUCOSAL | Status: DC | PRN

## 2015-08-31 MED ORDER — CEFAZOLIN SODIUM-DEXTROSE 2-4 GM/100ML-% IV SOLN
2.0000 g | Freq: Four times a day (QID) | INTRAVENOUS | Status: AC
  Administered 2015-08-31 – 2015-09-01 (×3): 2 g via INTRAVENOUS
  Filled 2015-08-31 (×5): qty 100

## 2015-08-31 MED ORDER — OCUVITE-LUTEIN PO CAPS
2.0000 | ORAL_CAPSULE | Freq: Two times a day (BID) | ORAL | Status: DC
  Filled 2015-08-31 (×4): qty 2

## 2015-08-31 MED ORDER — SENNOSIDES-DOCUSATE SODIUM 8.6-50 MG PO TABS
1.0000 | ORAL_TABLET | Freq: Two times a day (BID) | ORAL | Status: DC
  Administered 2015-08-31 – 2015-09-02 (×4): 1 via ORAL
  Filled 2015-08-31 (×4): qty 1

## 2015-08-31 MED ORDER — ONDANSETRON HCL 4 MG PO TABS: 4.0000 mg | ORAL_TABLET | Freq: Four times a day (QID) | ORAL | Status: DC | PRN

## 2015-08-31 MED ORDER — ALUM & MAG HYDROXIDE-SIMETH 200-200-20 MG/5ML PO SUSP: 30.0000 mL | ORAL | Status: DC | PRN

## 2015-08-31 MED ORDER — VANCOMYCIN HCL IN DEXTROSE 1-5 GM/200ML-% IV SOLN
1000.0000 mg | Freq: Once | INTRAVENOUS | Status: AC
  Administered 2015-08-31: 1000 mg via INTRAVENOUS

## 2015-08-31 MED ORDER — FLUTICASONE PROPIONATE 50 MCG/ACT NA SUSP
2.0000 | NASAL | Status: DC | PRN
  Filled 2015-08-31: qty 16

## 2015-08-31 MED ORDER — FAMOTIDINE 20 MG PO TABS
20.0000 mg | ORAL_TABLET | Freq: Once | ORAL | Status: AC
  Administered 2015-08-31: 20 mg via ORAL

## 2015-08-31 MED ORDER — TRANEXAMIC ACID 1000 MG/10ML IV SOLN
1000.0000 mg | INTRAVENOUS | Status: AC
  Administered 2015-08-31: 1000 mg via INTRAVENOUS
  Filled 2015-08-31: qty 10

## 2015-08-31 MED ORDER — APREMILAST 30 MG PO TABS
30.0000 mg | ORAL_TABLET | Freq: Two times a day (BID) | ORAL | Status: DC
  Administered 2015-08-31 – 2015-09-02 (×4): 30 mg via ORAL
  Filled 2015-08-31 (×6): qty 1

## 2015-08-31 MED ORDER — METOCLOPRAMIDE HCL 10 MG PO TABS
10.0000 mg | ORAL_TABLET | Freq: Three times a day (TID) | ORAL | Status: DC
  Administered 2015-08-31 – 2015-09-02 (×5): 10 mg via ORAL
  Filled 2015-08-31 (×5): qty 1

## 2015-08-31 MED ORDER — APIXABAN 5 MG PO TABS
5.0000 mg | ORAL_TABLET | Freq: Two times a day (BID) | ORAL | Status: DC
  Administered 2015-09-01 – 2015-09-02 (×3): 5 mg via ORAL
  Filled 2015-08-31 (×3): qty 1

## 2015-08-31 MED ORDER — VITAMIN D 1000 UNITS PO TABS
5000.0000 [IU] | ORAL_TABLET | Freq: Every day | ORAL | Status: DC
  Administered 2015-09-01 – 2015-09-02 (×2): 5000 [IU] via ORAL
  Filled 2015-08-31 (×2): qty 5

## 2015-08-31 MED ORDER — PANTOPRAZOLE SODIUM 40 MG PO TBEC
40.0000 mg | DELAYED_RELEASE_TABLET | Freq: Two times a day (BID) | ORAL | Status: DC
  Administered 2015-08-31 – 2015-09-02 (×4): 40 mg via ORAL
  Filled 2015-08-31 (×4): qty 1

## 2015-08-31 MED ORDER — FLUOXETINE HCL 20 MG PO CAPS
20.0000 mg | ORAL_CAPSULE | Freq: Every morning | ORAL | Status: DC
  Administered 2015-09-01 – 2015-09-02 (×2): 20 mg via ORAL
  Filled 2015-08-31 (×3): qty 1

## 2015-08-31 MED ORDER — TRANEXAMIC ACID 1000 MG/10ML IV SOLN
1000.0000 mg | Freq: Once | INTRAVENOUS | Status: AC
  Administered 2015-08-31: 1000 mg via INTRAVENOUS
  Filled 2015-08-31: qty 10

## 2015-08-31 MED ORDER — SODIUM CHLORIDE 0.9 % IV SOLN
INTRAVENOUS | Status: DC
Start: 2015-08-31 — End: 2015-09-02
  Administered 2015-08-31: 18:00:00 via INTRAVENOUS

## 2015-08-31 MED ORDER — MIDAZOLAM HCL 5 MG/5ML IJ SOLN
INTRAMUSCULAR | Status: DC | PRN
  Administered 2015-08-31: 2 mg via INTRAVENOUS

## 2015-08-31 MED ORDER — MAGNESIUM HYDROXIDE 400 MG/5ML PO SUSP
30.0000 mL | Freq: Every day | ORAL | Status: DC | PRN
  Administered 2015-09-01: 30 mL via ORAL
  Filled 2015-08-31: qty 30

## 2015-08-31 MED ORDER — FENTANYL CITRATE (PF) 100 MCG/2ML IJ SOLN
INTRAMUSCULAR | Status: AC
  Filled 2015-08-31: qty 2

## 2015-08-31 SURGICAL SUPPLY — 59 items
AUTOTRANSFUS HAS 1/8 (MISCELLANEOUS) ×2
BATTERY INSTRU NAVIGATION (MISCELLANEOUS) ×8 IMPLANT
BLADE SAW 1 (BLADE) ×4 IMPLANT
BLADE SAW 1/2 (BLADE) ×2 IMPLANT
BONE CEMENT GENTAMICIN (Cement) ×4 IMPLANT
CANISTER SUCT 1200ML W/VALVE (MISCELLANEOUS) ×2 IMPLANT
CANISTER SUCT 3000ML (MISCELLANEOUS) ×4 IMPLANT
CAPT KNEE TOTAL 3 ATTUNE ×2 IMPLANT
CATH TRAY METER 16FR LF (MISCELLANEOUS) ×2 IMPLANT
CEMENT BONE GENTAMICIN 40 (Cement) ×2 IMPLANT
COOLER POLAR GLACIER W/PUMP (MISCELLANEOUS) ×2 IMPLANT
CUFF TOURN 24 STER (MISCELLANEOUS) IMPLANT
CUFF TOURN 30 STER DUAL PORT (MISCELLANEOUS) ×2 IMPLANT
DRAPE SHEET LG 3/4 BI-LAMINATE (DRAPES) ×2 IMPLANT
DRSG DERMACEA 8X12 NADH (GAUZE/BANDAGES/DRESSINGS) ×2 IMPLANT
DRSG OPSITE POSTOP 4X14 (GAUZE/BANDAGES/DRESSINGS) ×2 IMPLANT
DRSG TEGADERM 4X4.75 (GAUZE/BANDAGES/DRESSINGS) ×2 IMPLANT
DURAPREP 26ML APPLICATOR (WOUND CARE) ×4 IMPLANT
ELECT CAUTERY BLADE 6.4 (BLADE) ×2 IMPLANT
ELECT REM PT RETURN 9FT ADLT (ELECTROSURGICAL) ×2
ELECTRODE REM PT RTRN 9FT ADLT (ELECTROSURGICAL) ×1 IMPLANT
EX-PIN ORTHOLOCK NAV 4X150 (PIN) ×4 IMPLANT
GLOVE BIOGEL M STRL SZ7.5 (GLOVE) ×4 IMPLANT
GLOVE INDICATOR 8.0 STRL GRN (GLOVE) ×2 IMPLANT
GLOVE SURG 9.0 ORTHO LTXF (GLOVE) ×2 IMPLANT
GLOVE SURG ORTHO 9.0 STRL STRW (GLOVE) ×2 IMPLANT
GOWN STRL REUS W/ TWL LRG LVL3 (GOWN DISPOSABLE) ×2 IMPLANT
GOWN STRL REUS W/TWL 2XL LVL3 (GOWN DISPOSABLE) ×2 IMPLANT
GOWN STRL REUS W/TWL LRG LVL3 (GOWN DISPOSABLE) ×2
HANDPIECE SUCTION TUBG SURGILV (MISCELLANEOUS) ×2 IMPLANT
HOLDER FOLEY CATH W/STRAP (MISCELLANEOUS) ×2 IMPLANT
HOOD PEEL AWAY FLYTE STAYCOOL (MISCELLANEOUS) ×4 IMPLANT
KIT RM TURNOVER STRD PROC AR (KITS) ×2 IMPLANT
KNIFE SCULPS 14X20 (INSTRUMENTS) ×2 IMPLANT
NDL SAFETY 18GX1.5 (NEEDLE) ×2 IMPLANT
NEEDLE SPNL 20GX3.5 QUINCKE YW (NEEDLE) ×2 IMPLANT
NS IRRIG 500ML POUR BTL (IV SOLUTION) ×2 IMPLANT
PACK TOTAL KNEE (MISCELLANEOUS) ×2 IMPLANT
PAD WRAPON POLAR KNEE (MISCELLANEOUS) ×1 IMPLANT
PIN DRILL QUICK PACK ×2 IMPLANT
PIN FIXATION 1/8DIA X 3INL (PIN) ×2 IMPLANT
SOL .9 NS 3000ML IRR  AL (IV SOLUTION) ×1
SOL .9 NS 3000ML IRR UROMATIC (IV SOLUTION) ×1 IMPLANT
SOL PREP PVP 2OZ (MISCELLANEOUS) ×2
SOLUTION PREP PVP 2OZ (MISCELLANEOUS) ×1 IMPLANT
SPONGE DRAIN TRACH 4X4 STRL 2S (GAUZE/BANDAGES/DRESSINGS) ×2 IMPLANT
STAPLER SKIN PROX 35W (STAPLE) ×2 IMPLANT
SUCTION FRAZIER HANDLE 10FR (MISCELLANEOUS) ×1
SUCTION TUBE FRAZIER 10FR DISP (MISCELLANEOUS) ×1 IMPLANT
SUT VIC AB 0 CT1 36 (SUTURE) ×2 IMPLANT
SUT VIC AB 1 CT1 36 (SUTURE) ×4 IMPLANT
SUT VIC AB 2-0 CT2 27 (SUTURE) ×2 IMPLANT
SYR 20CC LL (SYRINGE) ×2 IMPLANT
SYR 30ML LL (SYRINGE) ×2 IMPLANT
SYR 50ML LL SCALE MARK (SYRINGE) ×2 IMPLANT
SYSTEM AUTOTRANSFUS DUAL TROCR (MISCELLANEOUS) ×1 IMPLANT
TOWEL OR 17X26 4PK STRL BLUE (TOWEL DISPOSABLE) ×2 IMPLANT
TOWER CARTRIDGE SMART MIX (DISPOSABLE) ×2 IMPLANT
WRAPON POLAR PAD KNEE (MISCELLANEOUS) ×2

## 2015-08-31 NOTE — Progress Notes (Signed)
Wife to bring home med [name]), will have pharmacy dose.

## 2015-08-31 NOTE — Op Note (Signed)
OPERATIVE NOTE  DATE OF SURGERY:  08/31/2015  PATIENT NAME:  [name]   DOB: 12/31/1945  MRN: [id number]  PRE-OPERATIVE DIAGNOSIS: Degenerative arthrosis of the right knee, psoriatic arthritis  POST-OPERATIVE DIAGNOSIS:  Same  PROCEDURE:  Right total knee arthroplasty using computer-assisted navigation  SURGEON:  Marciano Sequin. M.D.  ASSISTANT:  Vance Peper, PA (present and scrubbed throughout the case, critical for assistance with exposure, retraction, instrumentation, and closure)  ANESTHESIA: spinal  ESTIMATED BLOOD LOSS: 100 mL  FLUIDS REPLACED: 2000 mL of crystalloid  TOURNIQUET TIME: 120 minutes  DRAINS: 2 medium drains to a reinfusion system  SOFT TISSUE RELEASES: Anterior cruciate ligament, posterior cruciate ligament, deep and superficial medial collateral ligament, patellofemoral ligament   IMPLANTS UTILIZED: DePuy Attune size 7 posterior stabilized femoral component (cemented), size 8 rotating platform tibial component (cemented), 41 mm medialized dome patella (cemented), and a 5 mm stabilized rotating platform polyethylene insert.  INDICATIONS FOR SURGERY: [name] is a 70 y.o. year old male with a long history of progressive knee pain. X-rays demonstrated severe degenerative changes in tricompartmental fashion. The patient had not seen any significant improvement despite conservative nonsurgical intervention. After discussion of the risks and benefits of surgical intervention, the patient expressed understanding of the risks benefits and agree with plans for total knee arthroplasty.   The risks, benefits, and alternatives were discussed at length including but not limited to the risks of infection, bleeding, nerve injury, stiffness, blood clots, the need for revision surgery, cardiopulmonary complications, among others, and they were willing to proceed.  PROCEDURE IN DETAIL: The patient was brought into the operating room and, after adequate spinal  anesthesia was achieved, a tourniquet was placed on the patient's upper thigh. The patient's knee and leg were cleaned and prepped with alcohol and DuraPrep and draped in the usual sterile fashion. A "timeout" was performed as per usual protocol. The lower extremity was exsanguinated using an Esmarch, and the tourniquet was inflated to 300 mmHg. An anterior longitudinal incision was made followed by a standard mid vastus approach. The deep fibers of the medial collateral ligament were elevated in a subperiosteal fashion off of the medial flare of the tibia so as to maintain a continuous soft tissue sleeve. The patella was subluxed laterally and the patellofemoral ligament was incised. Inspection of the knee demonstrated severe degenerative changes with full-thickness loss of articular cartilage. Osteophytes were debrided using a rongeur. Anterior and posterior cruciate ligaments were excised. Two 4.0 mm Schanz pins were inserted in the femur and into the tibia for attachment of the array of trackers used for computer-assisted navigation. Hip center was identified using a circumduction technique. Distal landmarks were mapped using the computer. The distal femur and proximal tibia were mapped using the computer. The distal femoral cutting guide was positioned using computer-assisted navigation so as to achieve a 5 distal valgus cut. The femur was sized and it was felt that a size 7 femoral component was appropriate. A size 7 femoral cutting guide was positioned and the anterior cut was performed and verified using the computer. This was followed by completion of the posterior and chamfer cuts. Femoral cutting guide for the central box was then positioned in the center box cut was performed.  Attention was then directed to the proximal tibia. Medial and lateral menisci were excised. The extramedullary tibial cutting guide was positioned using computer-assisted navigation so as to achieve a 0 varus-valgus alignment  and 3 posterior slope. The cut was performed and  verified using the computer. The proximal tibia was sized and it was felt that a size 8 tibial tray was appropriate. Tibial and femoral trials were inserted followed by insertion of a 5 mm polyethylene insert. The knee was felt to be tight medially. A Cobb elevator was used to elevate the superficial fibers of the medial collateral ligament. This allowed for excellent mediolateral soft tissue balancing both in flexion and in full extension. Finally, the patella was cut and prepared so as to accommodate a 41 mm medialized dome patella. A patella trial was placed and the knee was placed through a range of motion with excellent patellar tracking appreciated. The femoral trial was removed after debridement of posterior osteophytes. The central post-hole for the tibial component was reamed followed by insertion of a keel punch. Tibial trials were then removed. Cut surfaces of bone were irrigated with copious amounts of normal saline with antibiotic solution using pulsatile lavage and then suctioned dry. Polymethylmethacrylate cement with gentamicin was prepared in the usual fashion using a vacuum mixer. Cement was applied to the cut surface of the proximal tibia as well as along the undersurface of a size 8 rotating platform tibial component. Tibial component was positioned and impacted into place. Excess cement was removed using [employer] fast streamer. Cement was then applied to the cut surfaces of the femur as well as along the posterior flanges of the size 7 femoral component. The femoral component was positioned and impacted into place. Excess cement was removed using [employer] fast streamer. A 5 mm polyethylene trial was inserted and the knee was brought into full extension with steady axial compression applied. Finally, cement was applied to the backside of a 41 mm medialized dome patella and the patellar component was positioned and patellar clamp applied. Excess cement was  removed using [employer] fast streamer. After adequate curing of the cement, the tourniquet was deflated after a total tourniquet time of 120 minutes. Hemostasis was achieved using electrocautery. The knee was irrigated with copious amounts of normal saline with antibiotic solution using pulsatile lavage and then suctioned dry. 20 mL of 1.3% Exparel in 40 mL of normal saline was injected along the posterior capsule, medial and lateral gutters, and along the arthrotomy site. A 5 mm stabilized rotating platform polyethylene insert was inserted and the knee was placed through a range of motion with excellent mediolateral soft tissue balancing appreciated and excellent patellar tracking noted. 2 medium drains were placed in the wound bed and brought out through separate stab incisions to be attached to a reinfusion system. The medial parapatellar portion of the incision was reapproximated using interrupted sutures of #1 Vicryl. Subcutaneous tissue was then injected with a total of 30 cc of 0.25% Marcaine with epinephrine. Subcutaneous tissue was approximated in layers using first #0 Vicryl followed #2-0 Vicryl. The skin was approximated with skin staples. A sterile dressing was applied.  The patient tolerated the procedure well and was transported to the recovery room in stable condition.    James P. Holley Bouche., M.D.

## 2015-08-31 NOTE — Brief Op Note (Signed)
08/31/2015  3:42 PM  PATIENT:  [name]  70 y.o. male  PRE-OPERATIVE DIAGNOSIS:  Osteoarthritis of the right knee, psoriatic arthritis  POST-OPERATIVE DIAGNOSIS:  Same  PROCEDURE:  Procedure(s): COMPUTER ASSISTED TOTAL KNEE ARTHROPLASTY (Right)  SURGEON:  Surgeon(s) and Role:    * Dereck Leep, MD - Primary  ASSISTANTS: Vance Peper, PA   ANESTHESIA:   spinal  EBL:  Total I/O In: 2000 [I.V.:2000] Out: 300 [Urine:200; Blood:100]  BLOOD ADMINISTERED:none  DRAINS: 2 medium drains to a reinfusion system   LOCAL MEDICATIONS USED:  MARCAINE    and OTHER Exparel  SPECIMEN:  No Specimen  DISPOSITION OF SPECIMEN:  N/A  COUNTS:  YES  TOURNIQUET:   120 minutes  DICTATION: .Dragon Dictation  PLAN OF CARE: Admit to inpatient   PATIENT DISPOSITION:  PACU - hemodynamically stable.   Delay start of Pharmacological VTE agent (>24hrs) due to surgical blood loss or risk of bleeding: yes

## 2015-08-31 NOTE — Transfer of Care (Signed)
Immediate Anesthesia Transfer of Care Note  Patient: [name]  Procedure(s) Performed: Procedure(s): COMPUTER ASSISTED TOTAL KNEE ARTHROPLASTY (Right)  Patient Location: PACU  Anesthesia Type:Spinal  Level of Consciousness: awake and sedated  Airway & Oxygen Therapy: Patient Spontanous Breathing and Patient connected to face mask oxygen  Post-op Assessment: Report given to RN and Post -op Vital signs reviewed and stable  Post vital signs: Reviewed and stable  Last Vitals:  Filed Vitals:   08/31/15 0948 08/31/15 0949  BP:  153/88  Pulse:  62  Temp:  36.3 C  Resp: 18     Last Pain:  Filed Vitals:   08/31/15 1543  PainSc: 4          Complications: No apparent anesthesia complications
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
## 2015-08-31 NOTE — Anesthesia Procedure Notes (Addendum)
Spinal Patient location during procedure: OR Start time: 08/31/2015 11:33 AM End time: 08/31/2015 11:38 AM Staffing Anesthesiologist: Martha Clan Resident/CRNA: Nelda Marseille Performed by: resident/CRNA  Preanesthetic Checklist Completed: patient identified, site marked, surgical consent, pre-op evaluation, timeout performed, IV checked, risks and benefits discussed and monitors and equipment checked Spinal Block Patient position: sitting Prep: Betadine Patient monitoring: heart rate, continuous pulse ox, blood pressure and cardiac monitor Approach: midline Location: L4-5 Injection technique: single-shot Needle Needle type: Whitacre and Introducer  Needle gauge: 25 G Needle length: 9 cm Additional Notes Negative paresthesia. Negative blood return. Positive free-flowing CSF. Expiration date of kit checked and confirmed. Patient tolerated procedure well, without complications.    Date/Time: 08/31/2015 11:58 AM Performed by: Nelda Marseille Pre-anesthesia Checklist: Patient identified, Emergency Drugs available, Suction available, Patient being monitored and Timeout performed Oxygen Delivery Method: Simple face mask

## 2015-08-31 NOTE — H&P (Signed)
The patient has been re-examined, and the chart reviewed, and there have been no interval changes to the documented history and physical.    The risks, benefits, and alternatives have been discussed at length. The patient expressed understanding of the risks benefits and agreed with plans for surgical intervention.  James P. Hooten, Jr. M.D.    

## 2015-09-01 LAB — BASIC METABOLIC PANEL
ANION GAP: 7 (ref 5–15)
BUN: 15 mg/dL (ref 6–20)
CALCIUM: 8.5 mg/dL — AB (ref 8.9–10.3)
CO2: 23 mmol/L (ref 22–32)
Chloride: 106 mmol/L (ref 101–111)
Creatinine, Ser: 0.88 mg/dL (ref 0.61–1.24)
GFR calc Af Amer: >60 mL/min (ref >60)
GFR calc non Af Amer: >60 mL/min (ref >60)
GLUCOSE: 186 mg/dL — AB (ref 65–99)
Potassium: 4.3 mmol/L (ref 3.5–5.1)
Sodium: 136 mmol/L (ref 135–145)

## 2015-09-01 LAB — CBC
HEMATOCRIT: 33.2 % — AB (ref 40.0–52.0)
Hemoglobin: 11.3 g/dL — ABNORMAL LOW (ref 13.0–18.0)
MCH: 30.6 pg (ref 26.0–34.0)
MCHC: 34.2 g/dL (ref 32.0–36.0)
MCV: 89.7 fL (ref 80.0–100.0)
Platelets: 248 10*3/uL (ref 150–440)
RBC: 3.7 MIL/uL — ABNORMAL LOW (ref 4.40–5.90)
RDW: 14.3 % (ref 11.5–14.5)
WBC: 13.4 10*3/uL — AB (ref 3.8–10.6)

## 2015-09-01 MED ORDER — TRAMADOL HCL 50 MG PO TABS: 50.0000 mg | ORAL_TABLET | ORAL | Status: DC | PRN

## 2015-09-01 MED ORDER — LACTULOSE 10 GM/15ML PO SOLN: 10.0000 g | Freq: Two times a day (BID) | ORAL | Status: DC | PRN

## 2015-09-01 MED ORDER — OXYCODONE HCL 5 MG PO TABS: 5.0000 mg | ORAL_TABLET | ORAL | Status: DC | PRN

## 2015-09-01 NOTE — Clinical Social Work Note (Signed)
CSW consulted for New SNF. CSW reviewed chart. PT is recommending HHPT. RNCM is following. CSW is signing off as no further needs identified. Please reconsult if a need arises prior to discharge.   Darden Dates, MSW, LCSW  Clinical Social Worker  [phone]

## 2015-09-01 NOTE — Discharge Instructions (Signed)

## 2015-09-01 NOTE — Care Management (Signed)
Spoke with patient regarding home health agency again- patient 1st choice is [facility] depending on insurance authorization. Referral to [facility]. [facility] is looking at insurance to see if they can take patient. RNCM will continue to follow.

## 2015-09-01 NOTE — Progress Notes (Signed)
Patient dangled on the side of the bed; per patient request.

## 2015-09-01 NOTE — Evaluation (Signed)
Occupational Therapy Evaluation Patient Details Name: MCNEAL MILLET MRN: [id number] DOB: 26-Oct-1945 Today's Date: 09/01/2015    History of Present Illness Pt underwent R TKR without reported post-op complications.    Clinical Impression   Pt. Is a 70 y.o. Male who was admitted for a right TKR. Pt presents with limited ROM, Pain, weakness, and impaired functional mobility which hinder his ability to complete ADL and IADL tasks. Pt. could benefit from skilled OT services to review A/E use for LE ADLs, to review necessary home modifications, and to improve functional mobility for ADL/IADLs in order to work towards regaining Independence with ADL/IADLs.     Follow Up Recommendations       Equipment Recommendations       Recommendations for Other Services       Precautions / Restrictions Precautions Precautions: Knee Precaution Booklet Issued: Yes (comment) Restrictions Weight Bearing Restrictions: Yes RLE Weight Bearing: Weight bearing as tolerated      Mobility Bed Mobility               General bed mobility comments: Received upright in recliner and left in recliner  Transfers          Balance Overall balance assessment: Needs assistance Sitting-balance support: No upper extremity supported Sitting balance-Leahy Scale: Good                                  ADL Overall ADL's : Needs assistance/impaired Eating/Feeding: Set up   Grooming: Set up               Lower Body Dressing: Minimal assistance               Functional mobility during ADLs: Minimal assistance       Vision     Perception     Praxis      Pertinent Vitals/Pain Pain Assessment: 0-10 Pain Score: 4  Pain Location: Right Knee Pain Descriptors / Indicators: Aching Pain Intervention(s): Limited activity within patient's tolerance;Monitored during session     Hand Dominance Right   Extremity/Trunk Assessment Upper Extremity Assessment Upper Extremity  Assessment: Overall WFL for tasks assessed       Communication Communication Communication: No difficulties   Cognition Arousal/Alertness: Awake/alert Behavior During Therapy: WFL for tasks assessed/performed Overall Cognitive Status: Within Functional Limits for tasks assessed                     General Comments       Exercises Exercises: Total Joint     Shoulder Instructions      Home Living Family/patient expects to be discharged to:: Private residence Living Arrangements: Spouse/significant other Available Help at Discharge: Family Type of Home: House Home Access: Stairs to enter Technical brewer of Steps: 1 Entrance Stairs-Rails: None Home Layout: One level     Bathroom Shower/Tub: Occupational psychologist: [place]: Environmental consultant - 2 wheels;Cane - quad          Prior Functioning/Environment Level of Independence: Independent        Comments: Independent with ADLs/IADLs. Limited community ambulator without assistive device. Self-limiting distances due to R knee pain    OT Diagnosis:     OT Problem List:     OT Treatment/Interventions:      OT Goals(Current goals can be found in the care plan section) Acute Rehab OT Goals Patient Stated Goal:  Improve function with less pain at home  OT Frequency:     Barriers to D/C:            Co-evaluation              End of Session    Activity Tolerance:  Pt. Tolerated session well. Patient left:  Pt. Left in recliner, with call bell in reach, and wife present visiting.   Time: [id number] OT Time Calculation (min): 18 min Charges:  OT General Charges $OT Visit: 1 Procedure OT Evaluation $OT Eval Moderate Complexity: 1 Procedure G-Codes:    Harrel Carina, MS, OTR/L Harrel Carina 09/01/2015, 11:56 AM

## 2015-09-01 NOTE — Evaluation (Signed)
Physical Therapy Evaluation Patient Details Name: [name] MRN: [id number] DOB: 26-Nov-1945 Today's Date: 09/01/2015   History of Present Illness  Pt underwent R TKR without reported post-op complications. He is POD#1 at time of initial evaluation.  Clinical Impression  Pt demonstrates excellent mobility for initial evaluation on POD#1. He is already up in recliner so bed mobility not assessed. Will practice bed mobility during PM session. Terrial Rhodes is leaking and RN was already attempting to fix drain upon PT arrival. Pt able to ambulate from recliner to bathroom and back with rolling walker and step-to pattern. Overall he does very well for first session reporting minimal increase in knee pain. He is able to complete all seated exercises demonstrate excellent R hip flexion strength. Will progress ambulation distance this afternoon out in hallway and repeat exercises. Pt should be able to discharge home with wife, granddaughter, and [facility]. Pt will benefit from skilled PT services to address deficits in strength, balance, and mobility in order to return to full function at home.     Follow Up Recommendations Home health PT    Equipment Recommendations  3in1 (PT)    Recommendations for Other Services       Precautions / Restrictions Precautions Precautions: Knee Precaution Booklet Issued: Yes (comment) Restrictions Weight Bearing Restrictions: Yes RLE Weight Bearing: Weight bearing as tolerated      Mobility  Bed Mobility               General bed mobility comments: Received upright in recliner and left in recliner  Transfers Overall transfer level: Needs assistance Equipment used: Rolling walker (2 wheeled) Transfers: Sit to/from Stand Sit to Stand: Min guard         General transfer comment: Pt demonstrates decreased weight shift to RLE during transfer. Cues required for safe hand placement during transfers. Once upright pt is steady and stable with UE support.  Also educated and practiced toilet transfers with patient  Ambulation/Gait Ambulation/Gait assistance: Min guard Ambulation Distance (Feet): 50 Feet Assistive device: Rolling walker (2 wheeled) Gait Pattern/deviations: Step-to pattern;Decreased step length - left;Decreased stance time - right;Decreased weight shift to right;Antalgic Gait velocity: Decreased   General Gait Details: Pt ambulates with therapist from recliner to bathroom and back. Cues provided for proper sequencing with walker, upright posture, and decreased weight bearing through bilateral UEs. Pt demonstrates antalgic gait pattern with decreased speed but functional for limited household distances  Stairs            Wheelchair Mobility    Modified Rankin (Stroke Patients Only)       Balance Overall balance assessment: Needs assistance Sitting-balance support: No upper extremity supported Sitting balance-Leahy Scale: Good       Standing balance-Leahy Scale: Fair                               Pertinent Vitals/Pain Pain Assessment: 0-10 Pain Score: 4  Pain Location: R knee Pain Descriptors / Indicators: Aching Pain Intervention(s): Limited activity within patient's tolerance;Monitored during session    Home Living Family/patient expects to be discharged to:: Private residence Living Arrangements: Spouse/significant other;Other relatives;Other (Comment) (grandaughter) Available Help at Discharge: Family Type of Home: House Home Access: Stairs to enter Entrance Stairs-Rails: None Entrance Stairs-Number of Steps: 1 Home Layout: One level Home Equipment: Environmental consultant - 2 wheels;Shower seat - built in;Cane - quad      Prior Function Level of Independence: Independent  Comments: Independent with ADLs/IADLs. Limited community ambulator without assistive device. Self-limiting distances due to R knee pain     Hand Dominance   Dominant Hand: Right    Extremity/Trunk Assessment    Upper Extremity Assessment: Overall WFL for tasks assessed           Lower Extremity Assessment: RLE deficits/detail RLE Deficits / Details: Pt able to perform SLR on RLE without assistance. Full DF/PF. Reports full sensation to light touch. LLE grossly WFL       Communication   Communication: No difficulties  Cognition Arousal/Alertness: Awake/alert Behavior During Therapy: WFL for tasks assessed/performed Overall Cognitive Status: Within Functional Limits for tasks assessed                      General Comments      Exercises Total Joint Exercises Ankle Circles/Pumps: Strengthening;Right;10 reps;Seated (Heel raises) Hip ABduction/ADduction: Strengthening;Both;10 reps;Seated Straight Leg Raises: 5 reps;Supine;Strengthening;Right Long Arc Quad: Strengthening;Right;10 reps;Seated Knee Flexion: Strengthening;Right;10 reps;Seated Goniometric ROM: -5 to 84 degrees AAROM, pain limited      Assessment/Plan    PT Assessment Patient needs continued PT services  PT Diagnosis Difficulty walking;Abnormality of gait;Generalized weakness;Acute pain   PT Problem List Decreased strength;Decreased activity tolerance;Decreased balance;Decreased range of motion;Decreased mobility;Obesity;Pain  PT Treatment Interventions DME instruction;Gait training;Stair training;Therapeutic activities;Therapeutic exercise;Balance training;Neuromuscular re-education;Patient/family education;Manual techniques   PT Goals (Current goals can be found in the Care Plan section) Acute Rehab PT Goals Patient Stated Goal: Improve function with less pain at home PT Goal Formulation: With patient/family Time For Goal Achievement: 09/15/15 Potential to Achieve Goals: Good    Frequency BID   Barriers to discharge        Co-evaluation               End of Session Equipment Utilized During Treatment: Gait belt Activity Tolerance: Patient tolerated treatment well Patient left: in chair;with call  bell/phone within reach;with chair alarm set;with SCD's reapplied;Other (comment) (polar care in place, towel roll under heels) Nurse Communication: Mobility status;Other (comment) (Needs clean gown. RN aware that Terrial Rhodes is leaking)         Time: [id number] PT Time Calculation (min) (ACUTE ONLY): 44 min   Charges:   PT Evaluation $PT Eval Moderate Complexity: 1 Procedure PT Treatments $Therapeutic Exercise: 8-22 mins   PT G Codes:       Lyndel Safe Huprich PT, DPT   Huprich,Jason 09/01/2015, 11:39 AM

## 2015-09-01 NOTE — Care Management (Signed)
Received call from [facility] stating that they are not in-network with [employer]. [facility] picked [facility]. Referral sent.
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
## 2015-09-01 NOTE — Progress Notes (Signed)
Physical Therapy Treatment Patient Details Name: [name] MRN: [id number] DOB: 11/08/1945 Today's Date: 09/01/2015    History of Present Illness Pt underwent R TKR without reported post-op complications.     PT Comments    Pt demonstrates excellent progress with physical therapy at this time. He is able to complete a full lap around RN station with patient dictating ambulation distance. Denies increase in pain during ambulation. Pt does require 2 standing rest breaks due to fatigue and reports 4/10 on RPE during self-selected gait speed. He is able to complete all seated exercises as instructed. Pt elected again to remain up in recliner so bed mobility again not assessed. Will assess tomorrow AM as pt will likely be in bed upon arrival. Will also practice stairs tomorrow AM if appropriate to meet final PT goal. Pt will benefit from skilled PT services to address deficits in strength, balance, and mobility in order to return to full function at home.    Follow Up Recommendations  Home health PT     Equipment Recommendations  3in1 (PT)    Recommendations for Other Services       Precautions / Restrictions Precautions Precautions: Knee Precaution Booklet Issued: Yes (comment) Restrictions Weight Bearing Restrictions: Yes RLE Weight Bearing: Weight bearing as tolerated    Mobility  Bed Mobility               General bed mobility comments: Received upright in recliner and left in recliner  Transfers Overall transfer level: Needs assistance Equipment used: Rolling walker (2 wheeled) Transfers: Sit to/from Stand Sit to Stand: Min guard         General transfer comment: Pt demonstrates improving speed and sequencing as well as weight shifting during transfers. Safe hand placement demonstrated without need for cues  Ambulation/Gait Ambulation/Gait assistance: Min guard Ambulation Distance (Feet): 220 Feet Assistive device: Rolling walker (2 wheeled) Gait  Pattern/deviations: Step-through pattern Gait velocity: Decreased   General Gait Details: Cues provided to increase L step length and improve upright posture. Pt denies DOE and VSS during ambulation. Pt requires 2 standing rest breaks due to fatigue. Reports 4/10 on RPE during self-selected gait speed   Stairs            Wheelchair Mobility    Modified Rankin (Stroke Patients Only)       Balance Overall balance assessment: Needs assistance Sitting-balance support: No upper extremity supported Sitting balance-Leahy Scale: Good     Standing balance support: No upper extremity supported Standing balance-Leahy Scale: Fair                      Cognition Arousal/Alertness: Awake/alert Behavior During Therapy: WFL for tasks assessed/performed Overall Cognitive Status: Within Functional Limits for tasks assessed                      Exercises Total Joint Exercises Ankle Circles/Pumps: Strengthening;Right;10 reps;Seated Hip ABduction/ADduction: Strengthening;Both;10 reps;Seated Long Arc Quad: Strengthening;Right;10 reps;Seated Knee Flexion: Strengthening;Right;10 reps;Seated    General Comments        Pertinent Vitals/Pain Pain Assessment: 0-10 Pain Score: 3  Pain Location: R knee Pain Descriptors / Indicators: Aching Pain Intervention(s): Limited activity within patient's tolerance;Monitored during session    Home Living                      Prior Function            PT Goals (current goals can now be  found in the care plan section) Acute Rehab PT Goals Patient Stated Goal: Improve function with less pain at home PT Goal Formulation: With patient/family Time For Goal Achievement: 09/15/15 Potential to Achieve Goals: Good Progress towards PT goals: Progressing toward goals    Frequency  BID    PT Plan Current plan remains appropriate    Co-evaluation             End of Session Equipment Utilized During Treatment: Gait  belt Activity Tolerance: Patient tolerated treatment well Patient left: in chair;with call bell/phone within reach;with chair alarm set;with SCD's reapplied;Other (comment) (polar care in place, towel roll under heel)     Time: [id number] PT Time Calculation (min) (ACUTE ONLY): 30 min  Charges:  $Gait Training: 8-22 mins $Therapeutic Exercise: 8-22 mins                    G Codes:      Lyndel Safe Tobyn Osgood PT, DPT   [place] 09/01/2015, 4:42 PM
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
## 2015-09-01 NOTE — Progress Notes (Signed)
Patient dangled on the side of the bed per MD order

## 2015-09-01 NOTE — Anesthesia Postprocedure Evaluation (Signed)
Anesthesia Post Note  Patient: [name]  Procedure(s) Performed: Procedure(s) (LRB): COMPUTER ASSISTED TOTAL KNEE ARTHROPLASTY (Right)  Patient location during evaluation: Other Anesthesia Type: General Level of consciousness: awake and alert Pain management: pain level controlled Vital Signs Assessment: post-procedure vital signs reviewed and stable Respiratory status: spontaneous breathing, nonlabored ventilation, respiratory function stable and patient connected to nasal cannula oxygen Cardiovascular status: blood pressure returned to baseline and stable Postop Assessment: no signs of nausea or vomiting Anesthetic complications: no    Last Vitals:  Filed Vitals:   08/31/15 2300 09/01/15 0422  BP: 140/52 117/52  Pulse: 54 50  Temp: 37.3 C 37.3 C  Resp: 20 18    Last Pain:  Filed Vitals:   09/01/15 0552  PainSc: 7                  Niranjan Rufener S

## 2015-09-01 NOTE — Care Management Note (Signed)
Case Management Note  Patient Details  Name: DEQUAVION FOLLETTE MRN: [id number] Date of Birth: 1945/05/10  Subjective/Objective:                  Met with patient prior to PT evaluation. He agrees to SNF or HHPT. He is unsure what agency he would like to use. He states his friend has agreed to loaning a front-wheeled walker. Patient has been asked to have family bring that walker in for evaluation. He states his wife and 69 year old granddaughter can assist patient in the home. He is on Eliquis. He uses [facility] for Rx.  Action/Plan: List of home health agencies left with patient. RNCM will continue to follow.   Expected Discharge Date:  08/31/15               Expected Discharge Plan:     In-House Referral:     Discharge planning Services  CM Consult  Post Acute Care Choice:  Home Health Choice offered to:  Patient  DME Arranged:    DME Agency:     HH Arranged:    [place] Agency:     Status of Service:  In process, will continue to follow  Medicare Important Message Given:    Date Medicare IM Given:    Medicare IM give by:    Date Additional Medicare IM Given:    Additional Medicare Important Message give by:     If discussed at [place] of Stay Meetings, dates discussed:    Additional Comments:  Marshell Garfinkel, RN 09/01/2015, 10:00 AM

## 2015-09-01 NOTE — Progress Notes (Signed)
Inpatient Diabetes Program Recommendations  AACE/ADA: New Consensus Statement on Inpatient Glycemic Control (2015)  Target Ranges:  Prepandial:   less than 140 mg/dL      Peak postprandial:   less than 180 mg/dL (1-2 hours)      Critically ill patients:  140 - 180 mg/dL  Results for [name], [name] (MRN [id number]) as of 09/01/2015 11:46  Ref. Range 08/17/2015 15:00 08/31/2015 10:25 09/01/2015 06:23  Hemoglobin A1C Latest Ref Range: 4.0-6.0 % 6.6 (H)    Glucose Latest Ref Range: 65-99 mg/dL 209 (H) 163 (H) 186 (H)   Review of Glycemic Control  Diabetes history: DM2 (per office note by Rexene Edison, NP on 04/11/2015 patient has a history of Borderline DM-controlled with diet) Outpatient Diabetes medications: None Current orders for Inpatient glycemic control: None  Inpatient Diabetes Program Recommendations: Correction (SSI): Fasting lab glucose 186 mg/dl this morning. While inpatient, please consider ordering CBGs with Novolog sensitive correction scale ACHS. HgbA1C: A1C 6.6% on 08/17/2015.  Thanks, Barnie Alderman, RN, MSN, CDE Diabetes Coordinator Inpatient Diabetes Program [phone] (Team Pager from [place] to [place]) [phone] ([facility] office) [phone] [facility] [place] office) [phone] [facility] office)

## 2015-09-02 LAB — BASIC METABOLIC PANEL
ANION GAP: 9 (ref 5–15)
BUN: 10 mg/dL (ref 6–20)
CALCIUM: 8.4 mg/dL — AB (ref 8.9–10.3)
CHLORIDE: 101 mmol/L (ref 101–111)
CO2: 25 mmol/L (ref 22–32)
Creatinine, Ser: 0.86 mg/dL (ref 0.61–1.24)
GFR calc non Af Amer: >60 mL/min (ref >60)
Glucose, Bld: 190 mg/dL — ABNORMAL HIGH (ref 65–99)
Potassium: 3.9 mmol/L (ref 3.5–5.1)
SODIUM: 135 mmol/L (ref 135–145)

## 2015-09-02 LAB — CBC
HEMATOCRIT: 32.3 % — AB (ref 40.0–52.0)
HEMOGLOBIN: 11.2 g/dL — AB (ref 13.0–18.0)
MCH: 30.6 pg (ref 26.0–34.0)
MCHC: 34.7 g/dL (ref 32.0–36.0)
MCV: 88.2 fL (ref 80.0–100.0)
Platelets: 196 10*3/uL (ref 150–440)
RBC: 3.66 MIL/uL — ABNORMAL LOW (ref 4.40–5.90)
RDW: 13.9 % (ref 11.5–14.5)
WBC: 12.7 10*3/uL — AB (ref 3.8–10.6)
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
## 2015-09-02 NOTE — Progress Notes (Signed)
Physical Therapy Treatment Patient Details Name: [name] MRN: [id number] DOB: [birth year] Today's Date: 09/02/2015    History of Present Illness Pt underwent R TKR without reported post-op complications.     PT Comments    Pt in bed ready for session.  Exercises as described below.  Out of bed with min guard and use of rail.  Ambulated around nursing unit with step-through gait but uneven step length.  No loss of balance noted.  Pt was able to go up/down curb step simulation in gym with walker for support.  Pt has 1 step onto porch but no rails but stated it is wide enough to put walker up for support to step onto porch.  Wife in attendance and agreed.  Both felt comfortable with his ability to navigate safely.  Plan is to D/C home this afternoon.   Follow Up Recommendations  Home health PT     Equipment Recommendations  3in1 (PT)    Recommendations for Other Services       Precautions / Restrictions Precautions Precautions: Knee Restrictions Weight Bearing Restrictions: Yes RLE Weight Bearing: Weight bearing as tolerated    Mobility  Bed Mobility Overal bed mobility: Modified Independent             General bed mobility comments: uses rail  Transfers Overall transfer level: Needs assistance Equipment used: Rolling walker (2 wheeled) Transfers: Sit to/from Stand Sit to Stand: Min guard            Ambulation/Gait Ambulation/Gait assistance: Min guard Ambulation Distance (Feet): 250 Feet Assistive device: Rolling walker (2 wheeled) Gait Pattern/deviations: Step-through pattern;Decreased stance time - left;Decreased step length - right Gait velocity: Decreased Gait velocity interpretation: <1.8 ft/sec, indicative of risk for recurrent falls General Gait Details: cues to improve gait/waler safety   Stairs Stairs: Yes Stairs assistance: Min guard Stair Management: With walker Number of Stairs: 1 General stair comments: has one step in/out of house  where he can use walker, no rail  Wheelchair Mobility    Modified Rankin (Stroke Patients Only)       Balance Overall balance assessment: Needs assistance Sitting-balance support: No upper extremity supported Sitting balance-Leahy Scale: Good     Standing balance support: No upper extremity supported Standing balance-Leahy Scale: Fair                      Cognition Arousal/Alertness: Awake/alert Behavior During Therapy: WFL for tasks assessed/performed Overall Cognitive Status: Within Functional Limits for tasks assessed                      Exercises Total Joint Exercises Ankle Circles/Pumps: Strengthening;Right;10 reps;Seated Quad Sets: AROM;Both;10 reps;Supine Towel Squeeze: AROM;Both;10 reps;Supine Short Arc Quad: AROM;Left;10 reps;Supine Heel Slides: AROM;Left;10 reps;Supine Hip ABduction/ADduction: AROM;Left;10 reps;Supine Straight Leg Raises: AAROM;Left;10 reps;Supine Long Arc Quad: Strengthening;Right;10 reps;Seated Knee Flexion: Strengthening;Right;10 reps;Seated Goniometric ROM: 4-87    General Comments        Pertinent Vitals/Pain Pain Assessment: 0-10 Pain Score: 3  Pain Location: R knee Pain Descriptors / Indicators: Aching Pain Intervention(s): Limited activity within patient's tolerance;Ice applied    Home Living                      Prior Function            PT Goals (current goals can now be found in the care plan section) Progress towards PT goals: Progressing toward goals    Frequency  BID  PT Plan Current plan remains appropriate    Co-evaluation             End of Session Equipment Utilized During Treatment: Gait belt Activity Tolerance: Patient tolerated treatment well Patient left: with call bell/phone within reach;in bed (polar care)     Time: [id number] PT Time Calculation (min) (ACUTE ONLY): 23 min  Charges:  $Gait Training: 8-22 mins $Therapeutic Exercise: 8-22 mins                     G Codes:      Chesley Noon, PTA 09/02/2015, 10:05 AM

## 2015-09-02 NOTE — Progress Notes (Signed)
Physical Therapy Treatment Patient Details Name: [name] MRN: [id number] DOB: 1946/04/01 Today's Date: 09/02/2015    History of Present Illness Pt underwent R TKR without reported post-op complications.     PT Comments    Pt in chair.  Awaiting BM in order to be D/C.  Offered and accepted ambulation in order to encourage bowel function.  Ambulated with walker and min guard 180' with slow but steady gait.  Declined exercises this afternoon.  Anticipate D/C to home later today.  Discussed use of TEDS and [facility] at home.   Follow Up Recommendations  Home health PT     Equipment Recommendations  3in1 (PT)    Recommendations for Other Services       Precautions / Restrictions Precautions Precautions: Knee Restrictions Weight Bearing Restrictions: Yes RLE Weight Bearing: Weight bearing as tolerated    Mobility  Bed Mobility Overal bed mobility: Modified Independent             General bed mobility comments: uses rail  Transfers Overall transfer level: Needs assistance Equipment used: Rolling walker (2 wheeled) Transfers: Sit to/from Stand Sit to Stand: Min guard            Ambulation/Gait Ambulation/Gait assistance: Min guard Ambulation Distance (Feet): 180 Feet Assistive device: Rolling walker (2 wheeled) Gait Pattern/deviations: Step-through pattern;Decreased stance time - right;Decreased step length - left Gait velocity: Decreased Gait velocity interpretation: <1.8 ft/sec, indicative of risk for recurrent falls General Gait Details: safe use of walker    Stairs Stairs: Yes Stairs assistance: Min guard Stair Management: With walker Number of Stairs: 1 General stair comments: has one step in/out of house where he can use walker, no rail  Wheelchair Mobility    Modified Rankin (Stroke Patients Only)       Balance Overall balance assessment: Needs assistance Sitting-balance support: Feet supported Sitting balance-Leahy Scale: Good      Standing balance support: No upper extremity supported Standing balance-Leahy Scale: Fair                      Cognition Arousal/Alertness: Awake/alert Behavior During Therapy: WFL for tasks assessed/performed Overall Cognitive Status: Within Functional Limits for tasks assessed                      Exercises Total Joint Exercises Ankle Circles/Pumps: Strengthening;Right;10 reps;Seated Quad Sets: AROM;Both;10 reps;Supine Towel Squeeze: AROM;Both;10 reps;Supine Short Arc Quad: AROM;Left;10 reps;Supine Heel Slides: AROM;Left;10 reps;Supine Hip ABduction/ADduction: AROM;Left;10 reps;Supine Straight Leg Raises: AAROM;Left;10 reps;Supine Long Arc Quad: Strengthening;Right;10 reps;Seated Knee Flexion: Strengthening;Right;10 reps;Seated Goniometric ROM: 4-87    General Comments        Pertinent Vitals/Pain Pain Assessment: 0-10 Pain Score: 3  Pain Location: R knee Pain Descriptors / Indicators: Aching;Operative site guarding Pain Intervention(s): Limited activity within patient's tolerance;Ice applied    Home Living                      Prior Function            PT Goals (current goals can now be found in the care plan section) Progress towards PT goals: Progressing toward goals    Frequency  BID    PT Plan Current plan remains appropriate    Co-evaluation             End of Session Equipment Utilized During Treatment: Gait belt Activity Tolerance: Patient tolerated treatment well Patient left: with call bell/phone within reach;in chair;with family/visitor  present     Time: 1213-1238 PT Time Calculation (min) (ACUTE ONLY): 25 min  Charges:  $Gait Training: 23-37 mins $Therapeutic Exercise: 8-22 mins                    G Codes:      Chesley Noon, PTA 09/02/2015, 12:43 PM

## 2015-09-02 NOTE — Care Management (Addendum)
Patient discharging to home today. I have updated [name] with [facility]. No other RNCM needs. Patient is on Eliquis. Spoke with patient's wife and the borrowed rolling walker has been adjusted to meet patient's needs.  Case closed.

## 2015-09-06 ENCOUNTER — Encounter: Payer: Medicare HMO | Admitting: Family Medicine

## 2015-09-07 ENCOUNTER — Other Ambulatory Visit: Payer: Self-pay | Admitting: Family Medicine

## 2015-09-07 MED ORDER — PRAVASTATIN SODIUM 20 MG PO TABS: 20.0000 mg | ORAL_TABLET | Freq: Every day | ORAL | Status: DC

## 2015-09-07 NOTE — Progress Notes (Signed)
Patient notified as instructed by telephone and verbalized understanding. Follow-up lab appointment scheduled. Patient stated that he never started Crestor because it was too expensive.

## 2015-09-07 NOTE — Progress Notes (Signed)
See note from wife below.  Stop crestor, change to pravastatin.   Sent.   Still recheck labs in about 2 months.  Thanks.   ----- Message from Alexis Frock to [name], MD sent at 09/06/2015 7:45 PM -----      You prescribed Crestor for Reshard and it was given while he was in the hospital having his knee replaced. I went to pick it up and it was $141. Can't afford that and it will push him into the doughnut hole. I take generic pravastatin and that is $9 for 3 months. Would you look at other options that would work. He called the office and the triage nurse called and he was not able to answer. He returned the call and left a msg. When he didn't hear back that time he called the office again and was connected with Nurse Line and of course they knew nothing...he does not do My Chart and the phone system didn't work for him either. Please have someone call him at [phone]        Thanks    Vaughan Basta

## 2015-09-07 NOTE — Progress Notes (Signed)
Thanks

## 2015-09-29 ENCOUNTER — Encounter: Payer: Self-pay | Admitting: Family Medicine

## 2015-09-29 ENCOUNTER — Ambulatory Visit (INDEPENDENT_AMBULATORY_CARE_PROVIDER_SITE_OTHER): Payer: Medicare HMO | Admitting: Family Medicine

## 2015-09-29 VITALS — BP 152/70 | HR 93 | Temp 97.9°F | Ht 70.0 in | Wt 277.2 lb

## 2015-09-29 DIAGNOSIS — Z1211 Encounter for screening for malignant neoplasm of colon: Secondary | ICD-10-CM

## 2015-09-29 DIAGNOSIS — Z0000 Encounter for general adult medical examination without abnormal findings: Secondary | ICD-10-CM | POA: Diagnosis not present

## 2015-09-29 NOTE — Assessment & Plan Note (Signed)
Flu up to date Shingles not indicated given his other meds.  D/w pt.  PNA up to date Tetanus d/w pt.  See AVS.  D/w patient [id number] for colon cancer screening, including IFOB vs. colonoscopy.  Risks and benefits of both were discussed and patient voiced understanding.  Pt elects [id number].  Prostate cancer screening per uro.  Advance directive- wife designated if patient were incapacitated.  Cognitive function addressed- see scanned forms- and if abnormal then additional documentation follows.   He is ahead of schedule with PT re: his R knee.  We can recheck other labs later on this year.  He agrees.

## 2015-09-29 NOTE — Progress Notes (Signed)
Pre visit review using our clinic review tool, if applicable. No additional management support is needed unless otherwise documented below in the visit note.  I have personally reviewed the Medicare Annual Wellness questionnaire and have noted 1. The patient's medical and social history 2. Their use of alcohol, tobacco or illicit drugs 3. Their current medications and supplements 4. The patient's functional ability including ADL's, fall risks, home safety risks and hearing or visual             impairment. 5. Diet and physical activities 6. Evidence for depression or mood disorders  The patients weight, height, BMI have been recorded in the chart and visual acuity is per eye clinic.  I have made referrals, counseling and provided education to the patient based review of the above and I have provided the pt with a written personalized care plan for preventive services.  Provider list updated- see scanned forms.  Routine anticipatory guidance given to patient.  See health maintenance.  Flu up to date Shingles not indicated given his other meds.  D/w pt.  PNA up to date Tetanus d/w pt.  See AVS.  D/w patient [id number] for colon cancer screening, including IFOB vs. colonoscopy.  Risks and benefits of both were discussed and patient voiced understanding.  Pt elects [id number].  Prostate cancer screening per uro.  Advance directive- wife designated if patient were incapacitated.  Cognitive function addressed- see scanned forms- and if abnormal then additional documentation follows.   Off K supplement at this point.  Was repleted prev and last check wnl, d/w pt.    S/p R knee replacement and pain is clearly better.  See allergy list re: oxycodone.  Still doing PT and using his walker.  He is doing well with PT.    PMH and SH reviewed  Meds, vitals, and allergies reviewed.   ROS: Per HPI.  Unless specifically indicated otherwise in HPI, the patient denies:  General: fever. Eyes: acute  vision changes ENT: sore throat Cardiovascular: chest pain Respiratory: SOB GI: vomiting GU: dysuria Musculoskeletal: acute back pain Derm: acute rash Neuro: acute motor dysfunction Psych: worsening mood Endocrine: polydipsia Heme: bleeding Allergy: hayfever  GEN: nad, alert and oriented HEENT: mucous membranes moist NECK: supple w/o LA CV: rrr. PULM: ctab, no inc wob ABD: soft, +bs EXT: no edema SKIN: no acute rash R knee puffy but not red, in compression stockings.

## 2015-09-29 NOTE — Patient Instructions (Addendum)
Check with your insurance to see if they will cover the tetanus shot. Don't change your meds for now.  Go to the lab on the way out.  We'll contact you with your lab report.

## 2015-11-11 ENCOUNTER — Telehealth: Payer: Self-pay | Admitting: Adult Health

## 2015-11-11 MED ORDER — ROPINIROLE HCL 0.5 MG PO TABS: ORAL_TABLET | ORAL | 1 refills | Status: DC

## 2015-11-11 NOTE — Telephone Encounter (Signed)
Called and spoke with pt and he is aware of refill that has been called to the pharmacy. Nothing further is needed.

## 2015-11-11 NOTE — Telephone Encounter (Signed)
Spoke with pt, requesting refill on [facility].  Last refill:04/11/15 #180 with 1 refill, take 2 tabs po after dinner Last ov: 04/11/15 with TP (former [place] pt) Next ov:  Recall for 04/2016 with RA  Pt uses [facility] on [address].    RA please advise on refill.  Thanks!

## 2015-11-11 NOTE — Telephone Encounter (Signed)
Okay to refill? 

## 2015-11-23 ENCOUNTER — Other Ambulatory Visit (INDEPENDENT_AMBULATORY_CARE_PROVIDER_SITE_OTHER): Payer: Medicare HMO

## 2015-11-23 DIAGNOSIS — E785 Hyperlipidemia, unspecified: Secondary | ICD-10-CM

## 2015-11-23 LAB — HEPATIC FUNCTION PANEL
ALT: 18 U/L (ref 0–53)
AST: 17 U/L (ref 0–37)
Albumin: 3.7 g/dL (ref 3.5–5.2)
Alkaline Phosphatase: 114 U/L (ref 39–117)
BILIRUBIN DIRECT: 0.1 mg/dL (ref 0.0–0.3)
BILIRUBIN TOTAL: 0.7 mg/dL (ref 0.2–1.2)
Total Protein: 7.1 g/dL (ref 6.0–8.3)

## 2015-11-23 LAB — LIPID PANEL
CHOL/HDL RATIO: 4
Cholesterol: 137 mg/dL (ref 0–200)
HDL: 36.1 mg/dL — AB (ref >39.00)
LDL CALC: 76 mg/dL (ref 0–99)
NonHDL: 100.66
TRIGLYCERIDES: 121 mg/dL (ref 0.0–149.0)
VLDL: 24.2 mg/dL (ref 0.0–40.0)

## 2016-01-05 ENCOUNTER — Encounter
Admission: RE | Admit: 2016-01-05 | Discharge: 2016-01-05 | Disposition: A | Discharge status: Home / Self Care | Payer: Medicare HMO | Source: Ambulatory Visit | Attending: Orthopedic Surgery | Admitting: Orthopedic Surgery

## 2016-01-05 DIAGNOSIS — I4891 Unspecified atrial fibrillation: Secondary | ICD-10-CM | POA: Insufficient documentation

## 2016-01-05 DIAGNOSIS — Z01818 Encounter for other preprocedural examination: Secondary | ICD-10-CM | POA: Insufficient documentation

## 2016-01-05 DIAGNOSIS — R9431 Abnormal electrocardiogram [ECG] [EKG]: Secondary | ICD-10-CM | POA: Insufficient documentation

## 2016-01-05 DIAGNOSIS — I10 Essential (primary) hypertension: Secondary | ICD-10-CM | POA: Diagnosis not present

## 2016-01-05 HISTORY — DX: Depression, unspecified: F32.A

## 2016-01-05 HISTORY — DX: Restless legs syndrome: G25.81

## 2016-01-05 HISTORY — DX: Major depressive disorder, single episode, unspecified: F32.9

## 2016-01-05 HISTORY — DX: Personal history of urinary calculi: Z87.442

## 2016-01-05 LAB — SURGICAL PCR SCREEN
MRSA, PCR: NEGATIVE
STAPHYLOCOCCUS AUREUS: NEGATIVE

## 2016-01-05 LAB — URINALYSIS COMPLETE WITH MICROSCOPIC (ARMC ONLY)
BILIRUBIN URINE: NEGATIVE
Bacteria, UA: NONE SEEN
GLUCOSE, UA: 150 mg/dL — AB
Ketones, ur: NEGATIVE mg/dL
LEUKOCYTES UA: NEGATIVE
Nitrite: NEGATIVE
Protein, ur: NEGATIVE mg/dL
Specific Gravity, Urine: 1.009 (ref 1.005–1.030)
pH: 5 (ref 5.0–8.0)

## 2016-01-05 LAB — TYPE AND SCREEN
ABO/RH(D): O POS
Antibody Screen: NEGATIVE

## 2016-01-05 LAB — CBC
HEMATOCRIT: 36.5 % — AB (ref 40.0–52.0)
Hemoglobin: 12.8 g/dL — ABNORMAL LOW (ref 13.0–18.0)
MCH: 30.4 pg (ref 26.0–34.0)
MCHC: 34.9 g/dL (ref 32.0–36.0)
MCV: 87.2 fL (ref 80.0–100.0)
Platelets: 192 10*3/uL (ref 150–440)
RBC: 4.19 MIL/uL — ABNORMAL LOW (ref 4.40–5.90)
RDW: 14.9 % — AB (ref 11.5–14.5)
WBC: 6.8 10*3/uL (ref 3.8–10.6)

## 2016-01-05 LAB — COMPREHENSIVE METABOLIC PANEL
ALBUMIN: 3.9 g/dL (ref 3.5–5.0)
ALT: 21 U/L (ref 17–63)
ANION GAP: 9 (ref 5–15)
AST: 20 U/L (ref 15–41)
Alkaline Phosphatase: 95 U/L (ref 38–126)
BILIRUBIN TOTAL: 0.6 mg/dL (ref 0.3–1.2)
BUN: 19 mg/dL (ref 6–20)
CALCIUM: 9 mg/dL (ref 8.9–10.3)
CHLORIDE: 104 mmol/L (ref 101–111)
CO2: 26 mmol/L (ref 22–32)
CREATININE: 0.85 mg/dL (ref 0.61–1.24)
GFR calc Af Amer: >60 mL/min (ref >60)
GFR calc non Af Amer: >60 mL/min (ref >60)
GLUCOSE: 255 mg/dL — AB (ref 65–99)
POTASSIUM: 3.7 mmol/L (ref 3.5–5.1)
SODIUM: 139 mmol/L (ref 135–145)
Total Protein: 7.7 g/dL (ref 6.5–8.1)

## 2016-01-05 LAB — SEDIMENTATION RATE: SED RATE: 36 mm/h — AB (ref 0–20)

## 2016-01-05 LAB — PROTIME-INR
INR: 1.1
Prothrombin Time: 14.2 seconds (ref 11.4–15.2)

## 2016-01-05 LAB — APTT: APTT: 35 s (ref 24–36)

## 2016-01-05 NOTE — Patient Instructions (Signed)
  Your procedure is scheduled on: January 18, 2016 (Wednesday) Report to Same Day Surgery 2nd floor [facility] To find out your arrival time please call [phone] between 1PM - 3PM on January 17, 2016 (Tuesday)  Remember: Instructions that are not followed completely may result in serious medical risk, up to and including death, or upon the discretion of your surgeon and anesthesiologist your surgery may need to be rescheduled.    _x___ 1. Do not eat food or drink liquids after midnight. No gum chewing or hard candies.     __x__ 2. No Alcohol for 24 hours before or after surgery.   __x__3. No Smoking for 24 prior to surgery.   ____  4. Bring all medications with you on the day of surgery if instructed.    __x__ 5. Notify your doctor if there is any change in your medical condition     (cold, fever, infections).     Do not wear jewelry, make-up, hairpins, clips or nail polish.  Do not wear lotions, powders, or perfumes. You may wear deodorant.  Do not shave 48 hours prior to surgery. Men may shave face and neck.  Do not bring valuables to the hospital.    [facility] is not responsible for any belongings or valuables.               Contacts, dentures or bridgework may not be worn into surgery.  Leave your suitcase in the car. After surgery it may be brought to your room.  For patients admitted to the hospital, discharge time is determined by your treatment team.   Patients discharged the day of surgery will not be allowed to drive home.    Please read over the following fact sheets that you were given:   [facility] Preparing for Surgery and or MRSA Information   _x___ Take these medicines the morning of surgery with A SIP OF WATER:    1. Amiodarone  2.  3.  4.  5.  6.  ____Fleets enema or Magnesium Citrate as directed.   _x___ Use CHG Soap or sage wipes as directed on instruction sheet   ____ Use inhalers on the day of surgery and bring to hospital day of  surgery  ____ Stop metformin 2 days prior to surgery    ____ Take 1/2 of usual insulin dose the night before surgery and none on the morning of           surgery.   _x___ Stop aspirin or coumadin, or plavix (Stop Eliquis five days before surgery per patient per Dr. Marry Guan office)  x__ Stop Anti-inflammatories such as Advil, Aleve, Ibuprofen, Motrin, Naproxen,          Naprosyn, Goodies powders or aspirin products. Ok to take Tylenol.   _x___ Stop supplements until after surgery.  (Stop Preservision)  _x___ Bring C-Pap to the hospital. (Bring Bi-PAP to hospital)

## 2016-01-06 LAB — URINE CULTURE
CULTURE: NO GROWTH
SPECIAL REQUESTS: NORMAL

## 2016-01-06 LAB — HEMOGLOBIN A1C
Hgb A1c MFr Bld: 7.9 % — ABNORMAL HIGH (ref 4.8–5.6)
Mean Plasma Glucose: 180 mg/dL

## 2016-01-06 NOTE — Pre-Procedure Instructions (Signed)
A1C and Sed rate sent to Dr. Marry Guan and Anesthesia for review.

## 2016-01-18 ENCOUNTER — Encounter
Admission: RE | Disposition: A | Discharge status: Home Health | Payer: Self-pay | Source: Ambulatory Visit | Attending: Orthopedic Surgery

## 2016-01-18 ENCOUNTER — Inpatient Hospital Stay: Payer: Medicare HMO | Admitting: Anesthesiology

## 2016-01-18 ENCOUNTER — Inpatient Hospital Stay: Payer: Medicare HMO

## 2016-01-18 ENCOUNTER — Encounter: Payer: Self-pay | Admitting: Orthopedic Surgery

## 2016-01-18 ENCOUNTER — Inpatient Hospital Stay
Admission: RE | Admit: 2016-01-18 | Discharge: 2016-01-20 | DRG: 470 | Disposition: A | Discharge status: Home Health | Payer: Medicare HMO | Source: Ambulatory Visit | Attending: Orthopedic Surgery | Admitting: Orthopedic Surgery

## 2016-01-18 DIAGNOSIS — Z23 Encounter for immunization: Secondary | ICD-10-CM | POA: Diagnosis not present

## 2016-01-18 DIAGNOSIS — L409 Psoriasis, unspecified: Secondary | ICD-10-CM | POA: Diagnosis present

## 2016-01-18 DIAGNOSIS — G4730 Sleep apnea, unspecified: Secondary | ICD-10-CM | POA: Diagnosis present

## 2016-01-18 DIAGNOSIS — M6281 Muscle weakness (generalized): Secondary | ICD-10-CM

## 2016-01-18 DIAGNOSIS — K219 Gastro-esophageal reflux disease without esophagitis: Secondary | ICD-10-CM | POA: Diagnosis present

## 2016-01-18 DIAGNOSIS — I2510 Atherosclerotic heart disease of native coronary artery without angina pectoris: Secondary | ICD-10-CM | POA: Diagnosis present

## 2016-01-18 DIAGNOSIS — F419 Anxiety disorder, unspecified: Secondary | ICD-10-CM | POA: Diagnosis present

## 2016-01-18 DIAGNOSIS — F329 Major depressive disorder, single episode, unspecified: Secondary | ICD-10-CM | POA: Diagnosis present

## 2016-01-18 DIAGNOSIS — Z96659 Presence of unspecified artificial knee joint: Secondary | ICD-10-CM

## 2016-01-18 DIAGNOSIS — E1122 Type 2 diabetes mellitus with diabetic chronic kidney disease: Secondary | ICD-10-CM | POA: Diagnosis present

## 2016-01-18 DIAGNOSIS — I4892 Unspecified atrial flutter: Secondary | ICD-10-CM | POA: Diagnosis present

## 2016-01-18 DIAGNOSIS — I4891 Unspecified atrial fibrillation: Secondary | ICD-10-CM | POA: Diagnosis present

## 2016-01-18 DIAGNOSIS — G2581 Restless legs syndrome: Secondary | ICD-10-CM | POA: Diagnosis present

## 2016-01-18 DIAGNOSIS — Z6841 Body Mass Index (BMI) 40.0 and over, adult: Secondary | ICD-10-CM | POA: Diagnosis not present

## 2016-01-18 DIAGNOSIS — I130 Hypertensive heart and chronic kidney disease with heart failure and stage 1 through stage 4 chronic kidney disease, or unspecified chronic kidney disease: Secondary | ICD-10-CM | POA: Diagnosis present

## 2016-01-18 DIAGNOSIS — N189 Chronic kidney disease, unspecified: Secondary | ICD-10-CM | POA: Diagnosis present

## 2016-01-18 DIAGNOSIS — R262 Difficulty in walking, not elsewhere classified: Secondary | ICD-10-CM

## 2016-01-18 DIAGNOSIS — M1712 Unilateral primary osteoarthritis, left knee: Secondary | ICD-10-CM | POA: Diagnosis present

## 2016-01-18 HISTORY — PX: KNEE ARTHROPLASTY: SHX992

## 2016-01-18 LAB — GLUCOSE, CAPILLARY: GLUCOSE-CAPILLARY: 206 mg/dL — AB (ref 65–99)

## 2016-01-18 SURGERY — ARTHROPLASTY, KNEE, TOTAL, USING IMAGELESS COMPUTER-ASSISTED NAVIGATION
Anesthesia: Spinal | Site: Knee | Laterality: Left | Wound class: Clean

## 2016-01-18 MED ORDER — FENTANYL CITRATE (PF) 100 MCG/2ML IJ SOLN
INTRAMUSCULAR | Status: DC | PRN
  Administered 2016-01-18: 50 ug via INTRAVENOUS

## 2016-01-18 MED ORDER — TAMSULOSIN HCL 0.4 MG PO CAPS
0.4000 mg | ORAL_CAPSULE | Freq: Every day | ORAL | Status: DC
Start: 2016-01-18 — End: 2016-01-20
  Administered 2016-01-18 – 2016-01-19 (×2): 0.4 mg via ORAL
  Filled 2016-01-18 (×2): qty 1

## 2016-01-18 MED ORDER — MIDAZOLAM HCL 5 MG/5ML IJ SOLN
INTRAMUSCULAR | Status: DC | PRN
  Administered 2016-01-18: 1 mg via INTRAVENOUS

## 2016-01-18 MED ORDER — SODIUM CHLORIDE 0.9 % IV SOLN
INTRAVENOUS | Status: DC
  Administered 2016-01-18: 1000 mL via INTRAVENOUS
  Administered 2016-01-19: 100 mL/h via INTRAVENOUS

## 2016-01-18 MED ORDER — ALUM & MAG HYDROXIDE-SIMETH 200-200-20 MG/5ML PO SUSP: 30.0000 mL | ORAL | Status: DC | PRN

## 2016-01-18 MED ORDER — NEOMYCIN-POLYMYXIN B GU 40-200000 IR SOLN
Status: DC | PRN
  Administered 2016-01-18: 14 mL

## 2016-01-18 MED ORDER — CEFAZOLIN SODIUM-DEXTROSE 2-4 GM/100ML-% IV SOLN
2.0000 g | Freq: Four times a day (QID) | INTRAVENOUS | Status: AC
  Administered 2016-01-18 – 2016-01-19 (×4): 2 g via INTRAVENOUS
  Filled 2016-01-18 (×4): qty 100

## 2016-01-18 MED ORDER — DEXTROSE 5 % IV SOLN
3.0000 g | INTRAVENOUS | Status: AC
  Administered 2016-01-18: 3 g via INTRAVENOUS
  Filled 2016-01-18: qty 3000

## 2016-01-18 MED ORDER — ONDANSETRON HCL 4 MG/2ML IJ SOLN: 4.0000 mg | Freq: Four times a day (QID) | INTRAMUSCULAR | Status: DC | PRN

## 2016-01-18 MED ORDER — PROPOFOL 500 MG/50ML IV EMUL
INTRAVENOUS | Status: DC | PRN
  Administered 2016-01-18: 100 ug/kg/min via INTRAVENOUS

## 2016-01-18 MED ORDER — LISINOPRIL 5 MG PO TABS
2.5000 mg | ORAL_TABLET | Freq: Every day | ORAL | Status: DC
  Administered 2016-01-18 – 2016-01-19 (×2): 2.5 mg via ORAL
  Filled 2016-01-18 (×2): qty 1

## 2016-01-18 MED ORDER — SENNOSIDES-DOCUSATE SODIUM 8.6-50 MG PO TABS
1.0000 | ORAL_TABLET | Freq: Two times a day (BID) | ORAL | Status: DC
  Administered 2016-01-18 – 2016-01-20 (×4): 1 via ORAL
  Filled 2016-01-18 (×5): qty 1

## 2016-01-18 MED ORDER — APIXABAN 5 MG PO TABS
5.0000 mg | ORAL_TABLET | Freq: Two times a day (BID) | ORAL | Status: DC
  Administered 2016-01-19 – 2016-01-20 (×3): 5 mg via ORAL
  Filled 2016-01-18 (×4): qty 1

## 2016-01-18 MED ORDER — POTASSIUM CITRATE ER 10 MEQ (1080 MG) PO TBCR
10.0000 meq | EXTENDED_RELEASE_TABLET | Freq: Two times a day (BID) | ORAL | Status: DC
  Administered 2016-01-18 – 2016-01-20 (×4): 10 meq via ORAL
  Filled 2016-01-18 (×5): qty 1

## 2016-01-18 MED ORDER — FAMOTIDINE 20 MG PO TABS
20.0000 mg | ORAL_TABLET | Freq: Once | ORAL | Status: AC
  Administered 2016-01-18: 20 mg via ORAL

## 2016-01-18 MED ORDER — MENTHOL 3 MG MT LOZG
1.0000 | LOZENGE | OROMUCOSAL | Status: DC | PRN
  Filled 2016-01-18: qty 9

## 2016-01-18 MED ORDER — LORATADINE-PSEUDOEPHEDRINE ER 10-240 MG PO TB24: 1.0000 | ORAL_TABLET | Freq: Every day | ORAL | Status: DC

## 2016-01-18 MED ORDER — AMIODARONE HCL 200 MG PO TABS
200.0000 mg | ORAL_TABLET | Freq: Every day | ORAL | Status: DC
  Administered 2016-01-19 – 2016-01-20 (×2): 200 mg via ORAL
  Filled 2016-01-18 (×2): qty 1

## 2016-01-18 MED ORDER — ONDANSETRON HCL 4 MG/2ML IJ SOLN
INTRAMUSCULAR | Status: DC | PRN
  Administered 2016-01-18: 4 mg via INTRAVENOUS

## 2016-01-18 MED ORDER — SODIUM CHLORIDE 0.9 % IV SOLN
INTRAVENOUS | Status: DC | PRN
  Administered 2016-01-18: 60 mL

## 2016-01-18 MED ORDER — FLEET ENEMA 7-19 GM/118ML RE ENEM: 1.0000 | ENEMA | Freq: Once | RECTAL | Status: DC | PRN

## 2016-01-18 MED ORDER — DIPHENHYDRAMINE HCL 12.5 MG/5ML PO ELIX: 12.5000 mg | ORAL_SOLUTION | ORAL | Status: DC | PRN

## 2016-01-18 MED ORDER — PHENYLEPHRINE HCL 10 MG/ML IJ SOLN
INTRAMUSCULAR | Status: DC | PRN
  Administered 2016-01-18 (×2): 200 ug via INTRAVENOUS
  Administered 2016-01-18 (×2): 100 ug via INTRAVENOUS

## 2016-01-18 MED ORDER — INFLUENZA VAC SPLIT QUAD 0.5 ML IM SUSY
0.5000 mL | PREFILLED_SYRINGE | INTRAMUSCULAR | Status: AC
  Administered 2016-01-19: 0.5 mL via INTRAMUSCULAR
  Filled 2016-01-18: qty 0.5

## 2016-01-18 MED ORDER — NEOMYCIN-POLYMYXIN B GU 40-200000 IR SOLN
Status: AC
  Filled 2016-01-18: qty 20

## 2016-01-18 MED ORDER — BISACODYL 10 MG RE SUPP
10.0000 mg | Freq: Every day | RECTAL | Status: DC | PRN
  Administered 2016-01-20: 10 mg via RECTAL
  Filled 2016-01-18: qty 1

## 2016-01-18 MED ORDER — ACETAMINOPHEN 650 MG RE SUPP: 650.0000 mg | Freq: Four times a day (QID) | RECTAL | Status: DC | PRN

## 2016-01-18 MED ORDER — TAPENTADOL HCL 50 MG PO TABS
50.0000 mg | ORAL_TABLET | ORAL | Status: DC | PRN
  Administered 2016-01-18 (×2): 50 mg via ORAL
  Administered 2016-01-19: 100 mg via ORAL
  Filled 2016-01-18: qty 2
  Filled 2016-01-18 (×2): qty 1

## 2016-01-18 MED ORDER — FLUOXETINE HCL 20 MG PO TABS
20.0000 mg | ORAL_TABLET | Freq: Every morning | ORAL | Status: DC
  Filled 2016-01-18 (×2): qty 1

## 2016-01-18 MED ORDER — BUPIVACAINE HCL (PF) 0.25 % IJ SOLN
INTRAMUSCULAR | Status: AC
  Filled 2016-01-18: qty 60

## 2016-01-18 MED ORDER — FUROSEMIDE 40 MG PO TABS
40.0000 mg | ORAL_TABLET | Freq: Every morning | ORAL | Status: DC
  Administered 2016-01-19 – 2016-01-20 (×2): 40 mg via ORAL
  Filled 2016-01-18 (×2): qty 1

## 2016-01-18 MED ORDER — PRAVASTATIN SODIUM 20 MG PO TABS
20.0000 mg | ORAL_TABLET | Freq: Every day | ORAL | Status: DC
  Administered 2016-01-18 – 2016-01-20 (×3): 20 mg via ORAL
  Filled 2016-01-18 (×3): qty 1

## 2016-01-18 MED ORDER — FENTANYL CITRATE (PF) 100 MCG/2ML IJ SOLN
INTRAMUSCULAR | Status: AC
  Filled 2016-01-18: qty 2

## 2016-01-18 MED ORDER — LACTATED RINGERS IV SOLN
INTRAVENOUS | Status: DC | PRN
  Administered 2016-01-18: 14:00:00 via INTRAVENOUS

## 2016-01-18 MED ORDER — TRANEXAMIC ACID 1000 MG/10ML IV SOLN
1000.0000 mg | INTRAVENOUS | Status: DC
  Filled 2016-01-18: qty 10

## 2016-01-18 MED ORDER — GLYCOPYRROLATE 0.2 MG/ML IJ SOLN
INTRAMUSCULAR | Status: DC | PRN
  Administered 2016-01-18: 0.2 mg via INTRAVENOUS

## 2016-01-18 MED ORDER — EPHEDRINE SULFATE 50 MG/ML IJ SOLN
INTRAMUSCULAR | Status: DC | PRN
  Administered 2016-01-18: 10 mg via INTRAVENOUS
  Administered 2016-01-18: 5 mg via INTRAVENOUS
  Administered 2016-01-18: 15 mg via INTRAVENOUS

## 2016-01-18 MED ORDER — SODIUM CHLORIDE 0.9 % IV SOLN
INTRAVENOUS | Status: DC
  Administered 2016-01-18: 11:00:00 via INTRAVENOUS

## 2016-01-18 MED ORDER — BUPIVACAINE HCL (PF) 0.25 % IJ SOLN
INTRAMUSCULAR | Status: DC | PRN
  Administered 2016-01-18: 60 mL

## 2016-01-18 MED ORDER — APREMILAST 30 MG PO TABS
30.0000 mg | ORAL_TABLET | Freq: Two times a day (BID) | ORAL | Status: DC
  Administered 2016-01-19: 30 mg via ORAL
  Filled 2016-01-18 (×2): qty 1

## 2016-01-18 MED ORDER — TETRACAINE HCL 1 % IJ SOLN
INTRAMUSCULAR | Status: DC | PRN
  Administered 2016-01-18: 5 mg via INTRASPINAL

## 2016-01-18 MED ORDER — ONDANSETRON HCL 4 MG PO TABS: 4.0000 mg | ORAL_TABLET | Freq: Four times a day (QID) | ORAL | Status: DC | PRN

## 2016-01-18 MED ORDER — MAGNESIUM HYDROXIDE 400 MG/5ML PO SUSP
30.0000 mL | Freq: Every day | ORAL | Status: DC | PRN
  Administered 2016-01-19 – 2016-01-20 (×2): 30 mL via ORAL
  Filled 2016-01-18 (×2): qty 30

## 2016-01-18 MED ORDER — ACETAMINOPHEN 10 MG/ML IV SOLN
1000.0000 mg | Freq: Four times a day (QID) | INTRAVENOUS | Status: AC
  Administered 2016-01-18 – 2016-01-19 (×4): 1000 mg via INTRAVENOUS
  Filled 2016-01-18 (×4): qty 100

## 2016-01-18 MED ORDER — FAMOTIDINE 20 MG PO TABS
ORAL_TABLET | ORAL | Status: AC
  Administered 2016-01-18: 20 mg via ORAL
  Filled 2016-01-18: qty 1

## 2016-01-18 MED ORDER — SODIUM CHLORIDE 0.9 % IJ SOLN
INTRAMUSCULAR | Status: AC
  Filled 2016-01-18: qty 50

## 2016-01-18 MED ORDER — MORPHINE SULFATE (PF) 2 MG/ML IV SOLN
2.0000 mg | INTRAVENOUS | Status: DC | PRN
  Administered 2016-01-18 – 2016-01-19 (×4): 2 mg via INTRAVENOUS
  Filled 2016-01-18 (×4): qty 1

## 2016-01-18 MED ORDER — FLUTICASONE PROPIONATE 50 MCG/ACT NA SUSP
2.0000 | Freq: Every day | NASAL | Status: DC | PRN
  Filled 2016-01-18: qty 16

## 2016-01-18 MED ORDER — ACETAMINOPHEN 10 MG/ML IV SOLN
INTRAVENOUS | Status: AC
  Filled 2016-01-18: qty 100

## 2016-01-18 MED ORDER — TRANEXAMIC ACID 1000 MG/10ML IV SOLN
1000.0000 mg | Freq: Once | INTRAVENOUS | Status: AC
  Administered 2016-01-18: 1000 mg via INTRAVENOUS
  Filled 2016-01-18: qty 10

## 2016-01-18 MED ORDER — BUPIVACAINE LIPOSOME 1.3 % IJ SUSP
INTRAMUSCULAR | Status: AC
  Filled 2016-01-18: qty 20

## 2016-01-18 MED ORDER — LORATADINE 10 MG PO TABS
10.0000 mg | ORAL_TABLET | Freq: Every day | ORAL | Status: DC
  Administered 2016-01-18 – 2016-01-20 (×3): 10 mg via ORAL
  Filled 2016-01-18 (×3): qty 1

## 2016-01-18 MED ORDER — PANTOPRAZOLE SODIUM 40 MG PO TBEC
40.0000 mg | DELAYED_RELEASE_TABLET | Freq: Two times a day (BID) | ORAL | Status: DC
  Administered 2016-01-18 – 2016-01-20 (×4): 40 mg via ORAL
  Filled 2016-01-18 (×4): qty 1

## 2016-01-18 MED ORDER — CHLORHEXIDINE GLUCONATE 4 % EX LIQD: 60.0000 mL | Freq: Once | CUTANEOUS | Status: DC

## 2016-01-18 MED ORDER — CELECOXIB 200 MG PO CAPS
200.0000 mg | ORAL_CAPSULE | Freq: Two times a day (BID) | ORAL | Status: DC
  Administered 2016-01-18 – 2016-01-20 (×4): 200 mg via ORAL
  Filled 2016-01-18 (×4): qty 1

## 2016-01-18 MED ORDER — OCUVITE-LUTEIN PO CAPS
1.0000 | ORAL_CAPSULE | Freq: Two times a day (BID) | ORAL | Status: DC
  Administered 2016-01-18 – 2016-01-20 (×4): 1 via ORAL
  Filled 2016-01-18 (×4): qty 1

## 2016-01-18 MED ORDER — VITAMIN D 1000 UNITS PO TABS
5000.0000 [IU] | ORAL_TABLET | Freq: Every day | ORAL | Status: DC
  Administered 2016-01-18 – 2016-01-20 (×3): 5000 [IU] via ORAL
  Filled 2016-01-18 (×3): qty 5

## 2016-01-18 MED ORDER — TRAMADOL HCL 50 MG PO TABS
50.0000 mg | ORAL_TABLET | ORAL | Status: DC | PRN
  Administered 2016-01-18 (×2): 50 mg via ORAL
  Administered 2016-01-19: 100 mg via ORAL
  Filled 2016-01-18: qty 2
  Filled 2016-01-18 (×2): qty 1

## 2016-01-18 MED ORDER — PSEUDOEPHEDRINE HCL ER 120 MG PO TB12
120.0000 mg | ORAL_TABLET | Freq: Two times a day (BID) | ORAL | Status: DC
  Administered 2016-01-18 – 2016-01-20 (×4): 120 mg via ORAL
  Filled 2016-01-18 (×4): qty 1

## 2016-01-18 MED ORDER — PHENOL 1.4 % MT LIQD
1.0000 | OROMUCOSAL | Status: DC | PRN
  Filled 2016-01-18: qty 177

## 2016-01-18 MED ORDER — BUPIVACAINE HCL (PF) 0.5 % IJ SOLN
INTRAMUSCULAR | Status: DC | PRN
  Administered 2016-01-18: 2 mL

## 2016-01-18 MED ORDER — METOCLOPRAMIDE HCL 10 MG PO TABS
10.0000 mg | ORAL_TABLET | Freq: Three times a day (TID) | ORAL | Status: DC
  Administered 2016-01-18 – 2016-01-20 (×7): 10 mg via ORAL
  Filled 2016-01-18 (×7): qty 1

## 2016-01-18 MED ORDER — SODIUM CHLORIDE FLUSH 0.9 % IV SOLN
INTRAVENOUS | Status: AC
  Filled 2016-01-18: qty 10

## 2016-01-18 MED ORDER — FENTANYL CITRATE (PF) 100 MCG/2ML IJ SOLN
25.0000 ug | INTRAMUSCULAR | Status: DC | PRN
  Administered 2016-01-18: 50 ug via INTRAVENOUS

## 2016-01-18 MED ORDER — ROPINIROLE HCL 1 MG PO TABS
1.0000 mg | ORAL_TABLET | Freq: Every evening | ORAL | Status: DC
  Administered 2016-01-18 – 2016-01-19 (×2): 1 mg via ORAL
  Filled 2016-01-18 (×2): qty 1

## 2016-01-18 MED ORDER — ACETAMINOPHEN 325 MG PO TABS: 650.0000 mg | ORAL_TABLET | Freq: Four times a day (QID) | ORAL | Status: DC | PRN

## 2016-01-18 MED ORDER — FERROUS SULFATE 325 (65 FE) MG PO TABS
325.0000 mg | ORAL_TABLET | Freq: Two times a day (BID) | ORAL | Status: DC
  Administered 2016-01-18 – 2016-01-20 (×4): 325 mg via ORAL
  Filled 2016-01-18 (×4): qty 1

## 2016-01-18 SURGICAL SUPPLY — 59 items
AUTOTRANSFUS HAS 1/8 (MISCELLANEOUS) ×2
BATTERY INSTRU NAVIGATION (MISCELLANEOUS) ×8 IMPLANT
BLADE SAW 1 (BLADE) ×2 IMPLANT
BLADE SAW 1/2 (BLADE) ×2 IMPLANT
BONE CEMENT GENTAMICIN (Cement) ×4 IMPLANT
CANISTER SUCT 1200ML W/VALVE (MISCELLANEOUS) ×2 IMPLANT
CANISTER SUCT 3000ML (MISCELLANEOUS) ×4 IMPLANT
CAPT KNEE TOTAL 3 ATTUNE ×2 IMPLANT
CATH TRAY METER 16FR LF (MISCELLANEOUS) ×2 IMPLANT
CEMENT BONE GENTAMICIN 40 (Cement) ×2 IMPLANT
COOLER POLAR GLACIER W/PUMP (MISCELLANEOUS) ×2 IMPLANT
CUFF TOURN 24 STER (MISCELLANEOUS) IMPLANT
CUFF TOURN 30 STER DUAL PORT (MISCELLANEOUS) ×2 IMPLANT
DRAPE SHEET LG 3/4 BI-LAMINATE (DRAPES) ×2 IMPLANT
DRSG DERMACEA 8X12 NADH (GAUZE/BANDAGES/DRESSINGS) ×2 IMPLANT
DRSG OPSITE POSTOP 4X14 (GAUZE/BANDAGES/DRESSINGS) ×2 IMPLANT
DRSG TEGADERM 4X4.75 (GAUZE/BANDAGES/DRESSINGS) ×2 IMPLANT
DURAPREP 26ML APPLICATOR (WOUND CARE) ×4 IMPLANT
ELECT CAUTERY BLADE 6.4 (BLADE) ×2 IMPLANT
ELECT REM PT RETURN 9FT ADLT (ELECTROSURGICAL) ×2
ELECTRODE REM PT RTRN 9FT ADLT (ELECTROSURGICAL) ×1 IMPLANT
EX-PIN ORTHOLOCK NAV 4X150 (PIN) ×4 IMPLANT
GLOVE BIOGEL M STRL SZ7.5 (GLOVE) ×4 IMPLANT
GLOVE INDICATOR 8.0 STRL GRN (GLOVE) ×2 IMPLANT
GLOVE SURG 9.0 ORTHO LTXF (GLOVE) ×2 IMPLANT
GLOVE SURG ORTHO 9.0 STRL STRW (GLOVE) ×2 IMPLANT
GOWN STRL REUS W/ TWL LRG LVL3 (GOWN DISPOSABLE) ×2 IMPLANT
GOWN STRL REUS W/TWL 2XL LVL3 (GOWN DISPOSABLE) ×2 IMPLANT
GOWN STRL REUS W/TWL LRG LVL3 (GOWN DISPOSABLE) ×2
HANDPIECE INTERPULSE COAX TIP (DISPOSABLE) ×1
HOLDER FOLEY CATH W/STRAP (MISCELLANEOUS) ×2 IMPLANT
HOOD PEEL AWAY FLYTE STAYCOOL (MISCELLANEOUS) ×4 IMPLANT
KIT RM TURNOVER STRD PROC AR (KITS) ×2 IMPLANT
KNIFE SCULPS 14X20 (INSTRUMENTS) ×2 IMPLANT
LABEL OR SOLS (LABEL) ×2 IMPLANT
NDL SAFETY 18GX1.5 (NEEDLE) ×2 IMPLANT
NEEDLE SPNL 20GX3.5 QUINCKE YW (NEEDLE) ×2 IMPLANT
NS IRRIG 500ML POUR BTL (IV SOLUTION) ×2 IMPLANT
PACK TOTAL KNEE (MISCELLANEOUS) ×2 IMPLANT
PAD WRAPON POLAR KNEE (MISCELLANEOUS) ×1 IMPLANT
PIN FIXATION 1/8DIA X 3INL (PIN) ×2 IMPLANT
SET HNDPC FAN SPRY TIP SCT (DISPOSABLE) ×1 IMPLANT
SOL .9 NS 3000ML IRR  AL (IV SOLUTION) ×1
SOL .9 NS 3000ML IRR UROMATIC (IV SOLUTION) ×1 IMPLANT
SOL PREP PVP 2OZ (MISCELLANEOUS) ×2
SOLUTION PREP PVP 2OZ (MISCELLANEOUS) ×1 IMPLANT
SPONGE DRAIN TRACH 4X4 STRL 2S (GAUZE/BANDAGES/DRESSINGS) ×2 IMPLANT
STAPLER SKIN PROX 35W (STAPLE) ×2 IMPLANT
SUCTION FRAZIER HANDLE 10FR (MISCELLANEOUS) ×1
SUCTION TUBE FRAZIER 10FR DISP (MISCELLANEOUS) ×1 IMPLANT
SUT VIC AB 0 CT1 36 (SUTURE) ×2 IMPLANT
SUT VIC AB 1 CT1 36 (SUTURE) ×4 IMPLANT
SUT VIC AB 2-0 CT2 27 (SUTURE) ×2 IMPLANT
SYR 20CC LL (SYRINGE) ×2 IMPLANT
SYR 30ML LL (SYRINGE) ×4 IMPLANT
SYSTEM AUTOTRANSFUS DUAL TROCR (MISCELLANEOUS) ×1 IMPLANT
TOWEL OR 17X26 4PK STRL BLUE (TOWEL DISPOSABLE) ×2 IMPLANT
TOWER CARTRIDGE SMART MIX (DISPOSABLE) ×2 IMPLANT
WRAPON POLAR PAD KNEE (MISCELLANEOUS) ×2

## 2016-01-18 NOTE — H&P (Signed)
The patient has been re-examined, and the chart reviewed, and there have been no interval changes to the documented history and physical.    The risks, benefits, and alternatives have been discussed at length. The patient expressed understanding of the risks benefits and agreed with plans for surgical intervention.  Mariacristina Aday P. Aashika Carta, Jr. M.D.    

## 2016-01-18 NOTE — Brief Op Note (Signed)
01/18/2016  3:06 PM  PATIENT:  [name]  70 y.o. male  PRE-OPERATIVE DIAGNOSIS:  PRIMARY OSTEOARTHRITIS of the left knee  POST-OPERATIVE DIAGNOSIS:  Same  PROCEDURE:  Procedure(s): COMPUTER ASSISTED TOTAL KNEE ARTHROPLASTY (Left)  SURGEON:  Surgeon(s) and Role:    * Dereck Leep, MD - Primary  ASSISTANTS: Vance Peper, PA   ANESTHESIA:   spinal  EBL:  Total I/O In: 1000 [I.V.:1000] Out: 50 [Blood:50]  BLOOD ADMINISTERED:none  DRAINS: 2 medium drains to a reinfusion system   LOCAL MEDICATIONS USED:  MARCAINE    and OTHER Exparel  SPECIMEN:  No Specimen  DISPOSITION OF SPECIMEN:  N/A  COUNTS:  YES  TOURNIQUET:  * Missing tourniquet times found for documented tourniquets in log:  [id number] *  DICTATION: .Dragon Dictation  PLAN OF CARE: Admit to inpatient   PATIENT DISPOSITION:  PACU - hemodynamically stable.   Delay start of Pharmacological VTE agent (>24hrs) due to surgical blood loss or risk of bleeding: yes
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
## 2016-01-18 NOTE — Anesthesia Procedure Notes (Deleted)
Spinal  Patient location during procedure: OR Start time: 01/18/2016 11:21 AM End time: 01/18/2016 11:23 AM Staffing Anesthesiologist: Katy Fitch K Performed: anesthesiologist  Preanesthetic Checklist Completed: patient identified, site marked, surgical consent, pre-op evaluation, timeout performed, IV checked, risks and benefits discussed and monitors and equipment checked Spinal Block Patient position: sitting Prep: ChloraPrep Patient monitoring: heart rate, continuous pulse ox, blood pressure and cardiac monitor Approach: midline Location: L4-5 Injection technique: single-shot Needle Needle type: Whitacre and Introducer  Needle gauge: 24 G Needle length: 9 cm Assessment Sensory level: T10 Additional Notes Negative paresthesia. Negative blood return. Positive free-flowing CSF. Expiration date of kit checked and confirmed. Patient tolerated procedure well, without complications.

## 2016-01-18 NOTE — Transfer of Care (Signed)
Immediate Anesthesia Transfer of Care Note  Patient: [name]  Procedure(s) Performed: Procedure(s): COMPUTER ASSISTED TOTAL KNEE ARTHROPLASTY (Left)  Patient Location: PACU  Anesthesia Type:General and Spinal  Level of Consciousness: awake, alert  and oriented  Airway & Oxygen Therapy: Patient Spontanous Breathing and Patient connected to face mask oxygen  Post-op Assessment: Report given to RN and Post -op Vital signs reviewed and stable  Post vital signs: Reviewed and stable  Last Vitals:  Vitals:   01/18/16 1031  BP: (!) 152/68  Pulse: 87  Resp: 18    Last Pain:  Vitals:   01/18/16 1031  PainSc: 4          Complications: No apparent anesthesia complications

## 2016-01-18 NOTE — Anesthesia Procedure Notes (Addendum)
Spinal  Patient location during procedure: OR Start time: 01/18/2016 11:21 AM End time: 01/18/2016 11:23 AM Staffing Anesthesiologist: Katy Fitch K Performed: anesthesiologist  Preanesthetic Checklist Completed: patient identified, site marked, surgical consent, pre-op evaluation, timeout performed, IV checked, risks and benefits discussed and monitors and equipment checked Spinal Block Patient position: sitting Prep: ChloraPrep Patient monitoring: heart rate, continuous pulse ox, blood pressure and cardiac monitor Approach: midline Location: L4-5 Injection technique: single-shot Needle Needle type: Whitacre and Introducer  Needle gauge: 24 G Needle length: 15 cm Assessment Sensory level: T10 Additional Notes Negative paresthesia. Negative blood return. Positive free-flowing CSF. Expiration date of kit checked and confirmed. Patient tolerated procedure well, without complications.

## 2016-01-18 NOTE — Op Note (Signed)
OPERATIVE NOTE  DATE OF SURGERY:  01/18/2016  PATIENT NAME:  [name]   DOB: [birth year]  MRN: [id number]  PRE-OPERATIVE DIAGNOSIS: Degenerative arthrosis of the left knee, primary  POST-OPERATIVE DIAGNOSIS:  Same  PROCEDURE:  Left total knee arthroplasty using computer-assisted navigation  SURGEON:  Marciano Sequin. M.D.  ASSISTANT:  Vance Peper, PA (present and scrubbed throughout the case, critical for assistance with exposure, retraction, instrumentation, and closure)  ANESTHESIA: spinal  ESTIMATED BLOOD LOSS: 50 mL  FLUIDS REPLACED: 16009 mL of crystalloid  TOURNIQUET TIME: 98 minutes  DRAINS: 2 medium drains to a reinfusion system  SOFT TISSUE RELEASES: Anterior cruciate ligament, posterior cruciate ligament, deep and superficial medial collateral ligament, patellofemoral ligament  IMPLANTS UTILIZED: DePuy Attune size 7 posterior stabilized femoral component (cemented), size 8 rotating platform tibial component (cemented), 41 mm medialized dome patella (cemented), and a 5 mm stabilized rotating platform polyethylene insert.  INDICATIONS FOR SURGERY: [name] is a 70 y.o. year old male with a long history of progressive knee pain. X-rays demonstrated severe degenerative changes in tricompartmental fashion. The patient had not seen any significant improvement despite conservative nonsurgical intervention. After discussion of the risks and benefits of surgical intervention, the patient expressed understanding of the risks benefits and agree with plans for total knee arthroplasty.   The risks, benefits, and alternatives were discussed at length including but not limited to the risks of infection, bleeding, nerve injury, stiffness, blood clots, the need for revision surgery, cardiopulmonary complications, among others, and they were willing to proceed.  PROCEDURE IN DETAIL: The patient was brought into the operating room and, after adequate spinal anesthesia was achieved,  a tourniquet was placed on the patient's upper thigh. The patient's knee and leg were cleaned and prepped with alcohol and DuraPrep and draped in the usual sterile fashion. A "timeout" was performed as per usual protocol. The lower extremity was exsanguinated using an Esmarch, and the tourniquet was inflated to 300 mmHg. An anterior longitudinal incision was made followed by a standard mid vastus approach. The deep fibers of the medial collateral ligament were elevated in a subperiosteal fashion off of the medial flare of the tibia so as to maintain a continuous soft tissue sleeve. The patella was subluxed laterally and the patellofemoral ligament was incised. Inspection of the knee demonstrated severe degenerative changes with full-thickness loss of articular cartilage. Osteophytes were debrided using a rongeur. Anterior and posterior cruciate ligaments were excised. Two 4.0 mm Schanz pins were inserted in the femur and into the tibia for attachment of the array of trackers used for computer-assisted navigation. Hip center was identified using a circumduction technique. Distal landmarks were mapped using the computer. The distal femur and proximal tibia were mapped using the computer. The distal femoral cutting guide was positioned using computer-assisted navigation so as to achieve a 5 distal valgus cut. The femur was sized and it was felt that a size 7 femoral component was appropriate. A size 7 femoral cutting guide was positioned and the anterior cut was performed and verified using the computer. This was followed by completion of the posterior and chamfer cuts. Femoral cutting guide for the central box was then positioned in the center box cut was performed.  Attention was then directed to the proximal tibia. Medial and lateral menisci were excised. The extramedullary tibial cutting guide was positioned using computer-assisted navigation so as to achieve a 0 varus-valgus alignment and 3 posterior slope.  The cut was performed and verified using  the computer. The proximal tibia was sized and it was felt that a size 8 tibial tray was appropriate. Tibial and femoral trials were inserted followed by insertion of a 5 mm polyethylene insert. The knee was felt to be tight medially. A Cobb elevator was used to elevate the superficial fibers the medial collateral ligament. This allowed for excellent mediolateral soft tissue balancing both in flexion and in full extension. Finally, the patella was cut and prepared so as to accommodate a 41 mm medialized dome patella. A patella trial was placed and the knee was placed through a range of motion with excellent patellar tracking appreciated. The femoral trial was removed after debridement of posterior osteophytes. The central post-hole for the tibial component was reamed followed by insertion of a keel punch. Tibial trials were then removed. Cut surfaces of bone were irrigated with copious amounts of normal saline with antibiotic solution using pulsatile lavage and then suctioned dry. Polymethylmethacrylate cement with gentamicin was prepared in the usual fashion using a vacuum mixer. Cement was applied to the cut surface of the proximal tibia as well as along the undersurface of a size 8 rotating platform tibial component. Tibial component was positioned and impacted into place. Excess cement was removed using [employer] fast streamer. Cement was then applied to the cut surfaces of the femur as well as along the posterior flanges of the size 7 femoral component. The femoral component was positioned and impacted into place. Excess cement was removed using [employer] fast streamer. A 5 mm polyethylene trial was inserted and the knee was brought into full extension with steady axial compression applied. Finally, cement was applied to the backside of a 41 mm medialized dome patella and the patellar component was positioned and patellar clamp applied. Excess cement was removed using [employer] fast streamer.  After adequate curing of the cement, the tourniquet was deflated after a total tourniquet time of 98 minutes. Hemostasis was achieved using electrocautery. The knee was irrigated with copious amounts of normal saline with antibiotic solution using pulsatile lavage and then suctioned dry. 20 mL of 1.3% Exparel and 60 mL of 0.25% Marcaine in 40 mL of normal saline was injected along the posterior capsule, medial and lateral gutters, and along the arthrotomy site. A 5 mm stabilized rotating platform polyethylene insert was inserted and the knee was placed through a range of motion with excellent mediolateral soft tissue balancing appreciated and excellent patellar tracking noted. 2 medium drains were placed in the wound bed and brought out through separate stab incisions to be attached to a reinfusion system. The medial parapatellar portion of the incision was reapproximated using interrupted sutures of #1 Vicryl. Subcutaneous tissue was approximated in layers using first #0 Vicryl followed #2-0 Vicryl. The skin was approximated with skin staples. A sterile dressing was applied.  The patient tolerated the procedure well and was transported to the recovery room in stable condition.    Magnolia Mattila P. Holley Bouche., M.D.

## 2016-01-19 ENCOUNTER — Encounter: Payer: Self-pay | Admitting: Orthopedic Surgery

## 2016-01-19 LAB — BASIC METABOLIC PANEL
Anion gap: 5 (ref 5–15)
BUN: 15 mg/dL (ref 6–20)
CALCIUM: 8 mg/dL — AB (ref 8.9–10.3)
CO2: 23 mmol/L (ref 22–32)
CREATININE: 0.77 mg/dL (ref 0.61–1.24)
Chloride: 108 mmol/L (ref 101–111)
GFR calc Af Amer: >60 mL/min (ref >60)
GLUCOSE: 183 mg/dL — AB (ref 65–99)
Potassium: 3.3 mmol/L — ABNORMAL LOW (ref 3.5–5.1)
SODIUM: 136 mmol/L (ref 135–145)

## 2016-01-19 LAB — CBC
HCT: 29 % — ABNORMAL LOW (ref 40.0–52.0)
Hemoglobin: 10.2 g/dL — ABNORMAL LOW (ref 13.0–18.0)
MCH: 30.8 pg (ref 26.0–34.0)
MCHC: 35.2 g/dL (ref 32.0–36.0)
MCV: 87.5 fL (ref 80.0–100.0)
PLATELETS: 148 10*3/uL — AB (ref 150–440)
RBC: 3.31 MIL/uL — AB (ref 4.40–5.90)
RDW: 14.9 % — AB (ref 11.5–14.5)
WBC: 8.8 10*3/uL (ref 3.8–10.6)

## 2016-01-19 MED ORDER — FLUOXETINE HCL 20 MG PO CAPS
20.0000 mg | ORAL_CAPSULE | Freq: Every morning | ORAL | Status: DC
  Administered 2016-01-19 – 2016-01-20 (×2): 20 mg via ORAL
  Filled 2016-01-19: qty 1

## 2016-01-19 MED ORDER — HYDROCODONE-ACETAMINOPHEN 5-325 MG PO TABS
1.0000 | ORAL_TABLET | ORAL | Status: DC | PRN
  Administered 2016-01-19 (×2): 2 via ORAL
  Filled 2016-01-19 (×2): qty 2

## 2016-01-19 NOTE — Progress Notes (Addendum)
Physical Therapy Treatment Patient Details Name: [name] MRN: [id number] DOB: 06-22-1945 Today's Date: 01/19/2016    History of Present Illness Pt. is a 70 y.o. male who was admitted for a Left TKR.    PT Comments    Pt in bed; agreeable to PT. Rates left knee pain 2/10. Pt progressing well with ambulation distance; education regarding quality with improved, equal step lengths and greater attention to left hip/knee flexion during swing phase. Pt corrects inconsistently. Pt managing well with left knee extension and flexion stretching stretching/exercises. Received up in chair comfortably and encouraged continued ankle pumps and quad set exercises. Continue PT to progress strength, endurance, quality of ambulation, and stair climbing to improve all functional mobility and allow for an optimal, safe return home.   Follow Up Recommendations  Home health PT     Equipment Recommendations       Recommendations for Other Services       Precautions / Restrictions Precautions Precautions: Knee;Fall Precaution Booklet Issued: Yes (comment) Restrictions Weight Bearing Restrictions: Yes LLE Weight Bearing: Weight bearing as tolerated    Mobility  Bed Mobility Overal bed mobility: Modified Independent             General bed mobility comments: need rails to get to sitting, but no direct assist needed  Transfers Overall transfer level: Needs assistance Equipment used: Rolling walker (2 wheeled) Transfers: Sit to/from Stand Sit to Stand: Supervision         General transfer comment: Cues for safe hand placement  Ambulation/Gait Ambulation/Gait assistance: Min guard Ambulation Distance (Feet): 200 Feet Assistive device: Rolling walker (2 wheeled) Gait Pattern/deviations: Step-through pattern;Decreased stance time - left;Decreased step length - right;Decreased weight shift to left;Antalgic   Gait velocity interpretation: Below normal speed for age/gender General Gait  Details: Cues for increased hip knee flexion on L as well as more equal step lengths. Advised to slow pace a little for attention to quality   Stairs            Wheelchair Mobility    Modified Rankin (Stroke Patients Only)       Balance Overall balance assessment: Needs assistance Sitting-balance support: Feet supported Sitting balance-Leahy Scale: Good     Standing balance support: Bilateral upper extremity supported Standing balance-Leahy Scale: Fair (Fair +)                      Cognition Arousal/Alertness: Awake/alert Behavior During Therapy: WFL for tasks assessed/performed Overall Cognitive Status: Within Functional Limits for tasks assessed                      Exercises Total Joint Exercises Ankle Circles/Pumps: AROM;Both;20 reps Quad Sets: Strengthening;Both;20 reps Gluteal Sets: Strengthening;10 reps Short Arc Quad: AROM;10 reps Heel Slides: AROM;Strengthening;10 reps Hip ABduction/ADduction: Strengthening;10 reps Straight Leg Raises: AROM;10 reps Long Arc Quad: AROM;Left;10 reps;Seated Knee Flexion: AAROM;Left;10 reps;Seated (3 positions each rep x 10 sec for stretch) Goniometric ROM: 0-86 (gets to 80 degrees with AROM)    General Comments        Pertinent Vitals/Pain Pain Assessment: 0-10 Pain Score: 2  Pain Location: L knee Pain Descriptors / Indicators: Aching Pain Intervention(s): Monitored during session;Premedicated before session;Repositioned;Ice applied    Home Living Family/patient expects to be discharged to:: Private residence Living Arrangements: Spouse/significant other Available Help at Discharge: Family Type of Home: House Home Access: Stairs to enter Entrance Stairs-Rails: None Home Layout: One level Home Equipment: Environmental consultant - 2 wheels;Cane -  quad      Prior Function Level of Independence: Independent      Comments: Independent with ADLs/IADLs. Limited community ambulator without assistive device.  Self-limiting distances due to R knee pain   PT Goals (current goals can now be found in the care plan section) Acute Rehab PT Goals Patient Stated Goal: To return home PT Goal Formulation: With patient Time For Goal Achievement: 02/02/16 Potential to Achieve Goals: Good Progress towards PT goals: Progressing toward goals    Frequency    BID      PT Plan Current plan remains appropriate    Co-evaluation             End of Session Equipment Utilized During Treatment: Gait belt Activity Tolerance: Patient tolerated treatment well;Patient limited by fatigue Patient left: in chair;with call bell/phone within reach;with chair alarm set;Other (comment) (polar care in place)     Time: [id number] PT Time Calculation (min) (ACUTE ONLY): 26 min  Charges:  $Gait Training: 8-22 mins $Therapeutic Exercise: 8-22 mins                    G Codes:      Larae Grooms, PTA 01/19/2016, 2:23 PM

## 2016-01-19 NOTE — Anesthesia Postprocedure Evaluation (Signed)
Anesthesia Post Note  Patient: [name]  Procedure(s) Performed: Procedure(s) (LRB): COMPUTER ASSISTED TOTAL KNEE ARTHROPLASTY (Left)  Patient location during evaluation: Nursing Unit Anesthesia Type: Spinal Level of consciousness: awake and alert Pain management: pain level controlled Vital Signs Assessment: post-procedure vital signs reviewed and stable Respiratory status: spontaneous breathing Cardiovascular status: blood pressure returned to baseline Postop Assessment: no headache Anesthetic complications: no Comments: Foley removed this am.  Moves feet and legs.  One po pain med "not working", changed to something pt had with prior surgery.    Last Vitals:  Vitals:   01/19/16 0408 01/19/16 0738  BP: 115/75 133/70  Pulse: 78 82  Resp: 20 18  Temp: 36.9 C     Last Pain:  Vitals:   01/19/16 0744  TempSrc:   PainSc: 0-No pain                 Buckner [place]

## 2016-01-19 NOTE — Progress Notes (Signed)
Inpatient Diabetes Program Recommendations  AACE/ADA: New Consensus Statement on Inpatient Glycemic Control (2015)  Target Ranges:  Prepandial:   less than 140 mg/dL      Peak postprandial:   less than 180 mg/dL (1-2 hours)      Critically ill patients:  140 - 180 mg/dL   Lab Results  Component Value Date   GLUCAP 206 (H) 01/18/2016   HGBA1C 7.9 (H) 01/05/2016    Review of Glycemic Control  Results for CHANE, LUNN (MRN [id number]) as of 01/19/2016 13:57  Ref. Range 01/18/2016 10:32  Glucose-Capillary Latest Ref Range: 65 - 99 mg/dL 206 (H)    Diabetes history: Type 2 Outpatient Diabetes medications: none Current orders for Inpatient glycemic control: none  Inpatient Diabetes Program Recommendations:  Patient has a history of diabetes- A1C 7.9% and admission CBG 206mg /dl  Please consider ordering CBG tid and hs and Novolog sensitive correction tid, Novolog 0-5 units qhs  Please change diet to heart healthy/ carb modified.   Gentry Fitz, RN, BA, MHA, CDE Diabetes Coordinator Inpatient Diabetes Program  [phone] (Team Pager) [phone] ([place]) 01/19/2016 2:00 PM

## 2016-01-19 NOTE — Care Management Note (Signed)
Case Management Note  Patient Details  Name: [name] MRN: [id number] Date of Birth: 04-08-1945  Subjective/Objective:                  Met with patient to discuss discharge planning. He would like to return home with his wife and would like [facility]. He states he has a rolling walker and a bedside commode available for use in the home. He is on Eliquis. He uses [facility] for medications.  Action/Plan: List of home health agencies left with patient. [facility]erral to [facility].   Expected Discharge Date:                  Expected Discharge Plan:     In-House Referral:     Discharge planning Services  CM Consult  Post Acute Care Choice:  Home Health Choice offered to:  Patient  DME Arranged:    DME Agency:     HH Arranged:  PT [place]:  [place]  Status of Service:  In process, will continue to follow  If discussed at Long Length of Stay Meetings, dates discussed:    Additional Comments:  Marshell Garfinkel, RN 01/19/2016, 12:14 PM

## 2016-01-19 NOTE — Progress Notes (Signed)
Pt dangled with assist on the side of the bed, tolerated very well. Education on incentive spirometry reinforced. Pt was able to achieve 2244ml. Pt assisted to lay back on the bed with heels elevated with rolled towels, left leg with Polar care and bone foam on. Will continue to monitor.

## 2016-01-19 NOTE — Evaluation (Signed)
Physical Therapy Evaluation Patient Details Name: [name] MRN: [id number] DOB: March 03, 1946 Today's Date: 01/19/2016   History of Present Illness  70 y/o male s/p L TKA (10/18)  Clinical Impression  Pt did well with PT exam and showed good confidence with mobility and great effort with ambulation.  He has some expected post-op pain but is not overly limited with this.  He was able to achieve full TKE and had ~80 degrees of AROM flexion as well as ability to do 10 SLRs w/o assist.  Pt eager to work with PT and showed great effort with ~10 minutes of exercises apart from the PT exam.      Follow Up Recommendations Home health PT    Equipment Recommendations       Recommendations for Other Services       Precautions / Restrictions Precautions Precautions: Knee;Fall Precaution Booklet Issued: Yes (comment) Restrictions Weight Bearing Restrictions: Yes LLE Weight Bearing: Weight bearing as tolerated      Mobility  Bed Mobility Overal bed mobility: Modified Independent             General bed mobility comments: need rails to get to sitting, but no direct assist needed  Transfers Overall transfer level: Modified independent Equipment used: Rolling walker (2 wheeled)             General transfer comment: Pt able to get to standing w/o direct assist, he did need some cuing for set up and placement  Ambulation/Gait Ambulation/Gait assistance: Min guard Ambulation Distance (Feet): 50 Feet Assistive device: Rolling walker (2 wheeled)       General Gait Details: Pt did well with ambulation and showed good motivation and overall ability to maintain balance.  He is heavily reliant on the walker but has no unsteadiness or LOBs.    Stairs            Wheelchair Mobility    Modified Rankin (Stroke Patients Only)       Balance Overall balance assessment: Modified Independent                                           Pertinent  Vitals/Pain Pain Assessment: 0-10 Pain Score: 5     Home Living Family/patient expects to be discharged to:: Private residence Living Arrangements: Spouse/significant other Available Help at Discharge: Family Type of Home: House Home Access: Stairs to enter Entrance Stairs-Rails: None Entrance Stairs-Number of Steps: 1 Home Layout: One level Home Equipment: Environmental consultant - 2 wheels;Cane - quad      Prior Function Level of Independence: Independent         Comments: Independent with ADLs/IADLs. Limited community ambulator without assistive device. Self-limiting distances due to R knee pain     Hand Dominance        Extremity/Trunk Assessment   Upper Extremity Assessment: Overall WFL for tasks assessed           Lower Extremity Assessment: LLE deficits/detail   LLE Deficits / Details: expected post op deficits, grossly #+ to 4-/5 t/o     Communication   Communication: No difficulties  Cognition Arousal/Alertness: Awake/alert Behavior During Therapy: WFL for tasks assessed/performed Overall Cognitive Status: Within Functional Limits for tasks assessed                      General Comments      Exercises Total  Joint Exercises Ankle Circles/Pumps: Strengthening;10 reps Quad Sets: Strengthening;10 reps Gluteal Sets: Strengthening;10 reps Short Arc Quad: AROM;10 reps Heel Slides: AROM;Strengthening;10 reps Hip ABduction/ADduction: Strengthening;10 reps Straight Leg Raises: AROM;10 reps Knee Flexion: PROM;5 reps Goniometric ROM: 0-86 (gets to 80 degrees with AROM)   Assessment/Plan    PT Assessment Patient needs continued PT services  PT Problem List Decreased knowledge of use of DME;Decreased safety awareness;Decreased activity tolerance;Decreased range of motion;Decreased strength;Decreased balance;Decreased mobility;Pain          PT Treatment Interventions Gait training;DME instruction;Stair training;Functional mobility training;Therapeutic  activities;Therapeutic exercise;Balance training;Neuromuscular re-education;Patient/family education    PT Goals (Current goals can be found in the Care Plan section)  Acute Rehab PT Goals Patient Stated Goal: go home PT Goal Formulation: With patient Time For Goal Achievement: 02/02/16 Potential to Achieve Goals: Good    Frequency BID   Barriers to discharge        Co-evaluation               End of Session Equipment Utilized During Treatment: Gait belt Activity Tolerance: Patient tolerated treatment well Patient left: with chair alarm set;with call bell/phone within reach           Time: 0832-0901 PT Time Calculation (min) (ACUTE ONLY): 29 min   Charges:   PT Evaluation $PT Eval Low Complexity: 1 Procedure PT Treatments $Therapeutic Exercise: 8-22 mins   PT G Codes:        Kreg Shropshire, DPT 01/19/2016, 11:44 AM

## 2016-01-19 NOTE — Progress Notes (Signed)
Patient with home bipap machine. RN states will get order for use from md.
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
## 2016-01-19 NOTE — Evaluation (Signed)
Occupational Therapy Evaluation Patient Details Name: [name] MRN: [id number] DOB: 1945-08-28 Today's Date: 01/19/2016    History of Present Illness Pt. is a 70 y.o. male who was admitted for a Left TKR.   Clinical Impression   Pt. Is 70 y.o. Who was admitted to [facility] for a Left TKR. Pt. Is familiar with all A/E use for LE ADLs, and has all necessary DME from a recent TKR this past May. Pt. Reports no further questions or concerns about OT services, ADLs, IADLs, A/E, DME, or home modification at this time. No fruther OT services are indicated at this time.    Follow Up Recommendations  No OT follow up    Equipment Recommendations       Recommendations for Other Services       Precautions / Restrictions Precautions Precautions: Knee;Fall Precaution Booklet Issued: Yes (comment) Restrictions Weight Bearing Restrictions: Yes LLE Weight Bearing: Weight bearing as tolerated              ADL Overall ADL's : Needs assistance/impaired                                       General ADL Comments: Pt. reports being familiar with all A/E for LE ADL tasks from recent knee surgery this past may. Pt. has several A/E devices for LE dressing, and is able to use them without difficulty.      Vision     Perception     Praxis      Pertinent Vitals/Pain Pain Assessment: 0-10 Pain Score: 5  Pain Descriptors / Indicators: Aching Pain Intervention(s): Monitored during session;Limited activity within patient's tolerance     Hand Dominance Right   Extremity/Trunk Assessment Upper Extremity Assessment Upper Extremity Assessment: Overall WFL for tasks assessed       Communication Communication Communication: No difficulties   Cognition Arousal/Alertness: Awake/alert Behavior During Therapy: WFL for tasks assessed/performed Overall Cognitive Status: Within Functional Limits for tasks assessed                     General Comments           Shoulder Instructions      Home Living Family/patient expects to be discharged to:: Private residence Living Arrangements: Spouse/significant other Available Help at Discharge: Family Type of Home: House Home Access: Stairs to enter Technical brewer of Steps: 1 Entrance Stairs-Rails: None Home Layout: One level     Bathroom Shower/Tub: Occupational psychologist: Standard     Home Equipment: Environmental consultant - 2 wheels;Cane - quad          Prior Functioning/Environment Level of Independence: Independent        Comments: Independent with ADLs/IADLs. Limited community ambulator without assistive device. Self-limiting distances due to R knee pain        OT Problem List:     OT Treatment/Interventions:      OT Goals(Current goals can be found in the care plan section) Acute Rehab OT Goals Patient Stated Goal: To return home OT Goal Formulation: With patient Potential to Achieve Goals: Good  OT Frequency:     Barriers to D/C:            Co-evaluation              End of Session    Activity Tolerance: Patient tolerated treatment well Patient left: in bed;with  call bell/phone within reach;with bed alarm set   Time: 1120-1140 OT Time Calculation (min): 20 min Charges:  OT General Charges $OT Visit: 1 Procedure OT Evaluation $OT Eval Low Complexity: 1 Procedure G-Codes:    Harrel Carina, MS, OTR/L 01/19/2016, 12:11 PM

## 2016-01-19 NOTE — Progress Notes (Signed)
Diabetic Coordinator contacted RN about patient having a PMH of diabetes. Patient had an admission blood sugar of 206. MD notified. No new orders at this time. Patient diet changed from regular to carb mod/ heart healthy.   [name], RN

## 2016-01-20 LAB — BASIC METABOLIC PANEL
Anion gap: 6 (ref 5–15)
BUN: 11 mg/dL (ref 6–20)
CO2: 25 mmol/L (ref 22–32)
Calcium: 8.3 mg/dL — ABNORMAL LOW (ref 8.9–10.3)
Chloride: 105 mmol/L (ref 101–111)
Creatinine, Ser: 0.78 mg/dL (ref 0.61–1.24)
GFR calc Af Amer: >60 mL/min (ref >60)
GLUCOSE: 203 mg/dL — AB (ref 65–99)
POTASSIUM: 3.8 mmol/L (ref 3.5–5.1)
Sodium: 136 mmol/L (ref 135–145)

## 2016-01-20 LAB — CBC
HEMATOCRIT: 30.7 % — AB (ref 40.0–52.0)
Hemoglobin: 10.9 g/dL — ABNORMAL LOW (ref 13.0–18.0)
MCH: 31 pg (ref 26.0–34.0)
MCHC: 35.5 g/dL (ref 32.0–36.0)
MCV: 87.5 fL (ref 80.0–100.0)
PLATELETS: 165 10*3/uL (ref 150–440)
RBC: 3.51 MIL/uL — AB (ref 4.40–5.90)
RDW: 14.9 % — AB (ref 11.5–14.5)
WBC: 10.8 10*3/uL — ABNORMAL HIGH (ref 3.8–10.6)

## 2016-01-20 MED ORDER — TRAMADOL HCL 50 MG PO TABS: 50.0000 mg | ORAL_TABLET | Freq: Four times a day (QID) | ORAL | 1 refills | Status: DC | PRN

## 2016-01-20 MED ORDER — HYDROCODONE-ACETAMINOPHEN 5-325 MG PO TABS: 1.0000 | ORAL_TABLET | ORAL | 0 refills | Status: DC | PRN

## 2016-01-20 MED ORDER — CELECOXIB 200 MG PO CAPS
200.0000 mg | ORAL_CAPSULE | Freq: Two times a day (BID) | ORAL | 1 refills | Status: DC
Start: 2016-01-20 — End: 2016-05-08

## 2016-01-20 NOTE — Progress Notes (Signed)
[name] to be D/C'd Home per MD order.  Discussed with the patient and all questions fully answered.  VSS, Skin clean, dry and intact without evidence of skin break down, no evidence of skin tears noted. IV catheter discontinued intact. Site without signs and symptoms of complications. Dressing and pressure applied.  An After Visit Summary was printed and given to the patient. Patient received prescription.  D/c education completed with patient/family including follow up instructions, medication list, d/c activities limitations if indicated, with other d/c instructions as indicated by MD - patient able to verbalize understanding, all questions fully answered.   Patient instructed to return to ED, call 911, or call MD for any changes in condition.   Patient escorted via [place], and D/C home via private auto.  [name] 01/20/2016 1:33 PM

## 2016-01-20 NOTE — Progress Notes (Signed)
Physical Therapy Treatment Patient Details Name: [name] MRN: [id number] DOB: [birth year] Today's Date: 01/20/2016    History of Present Illness Pt. is a 70 y.o. male who was admitted for a Left TKR.    PT Comments    Pt is able to ambulate well with consistent cadence, good speed, minimal pain and overall showed safe and confident gait.  He was able to negotiate up/down a single steps showing ability to get into the home w/o any concerns, he was easily able to do SLRs showing great quad control and his ROM is 0 to >90.  Overall pt doing very well.   Follow Up Recommendations  Home health PT     Equipment Recommendations       Recommendations for Other Services       Precautions / Restrictions Precautions Precautions: Knee;Fall Restrictions LLE Weight Bearing: Weight bearing as tolerated    Mobility  Bed Mobility Overal bed mobility: Independent             General bed mobility comments: Pt able to get up to sitting w/o any hesitation or assist  Transfers Overall transfer level: Independent Equipment used: Rolling walker (2 wheeled) Transfers: Sit to/from Stand Sit to Stand: Independent         General transfer comment: Pt able to rise w/o assist and with no hesitation.  Overall doing well.   Ambulation/Gait Ambulation/Gait assistance: Modified independent (Device/Increase time) Ambulation Distance (Feet): 250 Feet Assistive device: Rolling walker (2 wheeled)       General Gait Details: Pt only needing a few "warm up" steps befor feeling confident and able to take good weight through L LE and overall was able to show consistent and relatively confident.   Stairs Stairs: Yes Stairs assistance: Modified independent (Device/Increase time) Stair Management: No rails Number of Stairs: 1 General stair comments: Pt able to independently get up onto and off of single step platform  Wheelchair Mobility    Modified Rankin (Stroke Patients Only)        Balance                                    Cognition Arousal/Alertness: Awake/alert Behavior During Therapy: WFL for tasks assessed/performed Overall Cognitive Status: Within Functional Limits for tasks assessed                      Exercises Total Joint Exercises Ankle Circles/Pumps: AROM;10 reps Quad Sets: Strengthening;10 reps Gluteal Sets: Strengthening;10 reps Short Arc Quad: Strengthening;AROM;10 reps Heel Slides: Strengthening;10 reps Hip ABduction/ADduction: Strengthening;10 reps Straight Leg Raises: AROM;10 reps Knee Flexion: PROM;5 reps Goniometric ROM: 0-94    General Comments        Pertinent Vitals/Pain Pain Assessment: 0-10 Pain Score: 2     Home Living                      Prior Function            PT Goals (current goals can now be found in the care plan section) Progress towards PT goals: Progressing toward goals    Frequency    BID      PT Plan Current plan remains appropriate    Co-evaluation             End of Session Equipment Utilized During Treatment: Gait belt Activity Tolerance: Patient tolerated treatment well;Patient limited by fatigue Patient  left: in chair;with call bell/phone within reach;with chair alarm set;Other (comment)     [id number] PT Time Calculation (min) (ACUTE ONLY): 28 min  Charges:  $Gait Training: 8-22 mins $Therapeutic Exercise: 8-22 mins                    G Codes:      Kreg Shropshire, DPT 01/20/2016, 9:42 AM

## 2016-01-20 NOTE — Care Management (Signed)
[facility] notified of patient discharge to home today. No further RNCM needs.
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
## 2016-01-20 NOTE — Discharge Instructions (Signed)
TOTAL KNEE REPLACEMENT POSTOPERATIVE DIRECTIONS ° °Knee Rehabilitation, Guidelines Following Surgery  °Results after knee surgery are often greatly improved when you follow the exercise, range of motion and muscle strengthening exercises prescribed by your doctor. Safety measures are also important to protect the knee from further injury. Any time any of these exercises cause you to have increased pain or swelling in your knee joint, decrease the amount until you are comfortable again and slowly increase them. If you have problems or questions, call your caregiver or physical therapist for advice.  ° °HOME CARE INSTRUCTIONS  °Remove items at home which could result in a fall. This includes throw rugs or furniture in walking pathways.  °· ICE using the Polar Care unit to the affected knee every three hours for 30 minutes at a time and then as needed for pain and swelling.  Place a dry towel or pillow case over the knee before applying the Polar Care Unit.  Continue to use ice on the knee for pain and swelling from surgery. You may notice swelling that will progress down to the foot and ankle.  This is normal after surgery.  Elevate the leg when you are not up walking on it.   °· Continue to use the breathing machine which will help keep your temperature down.  It is common for your temperature to cycle up and down following surgery, especially at night when you are not up moving around and exerting yourself.  The breathing machine keeps your lungs expanded and your temperature down. °· Do not place pillow under knee, focus on keeping the knee straight while resting ° °DIET °You may resume your previous home diet once your are discharged from the hospital. ° °DRESSING / WOUND CARE / SHOWERING °Keep the surgical dressing until follow up.  The dressing is water proof, so you can shower without any extra covering.  IF THE DRESSING FALLS OFF or the wound gets wet inside, change the dressing with sterile gauze.  Please use  good hand washing techniques before changing the dressing.  Do not use any lotions or creams on the incision until instructed by your surgeon.   °You need to keep your wound dry after being discharged home.  Just keep the incision dry and apply a dry gauze dressing on daily. °Change the surgical dressing only if needed and reapply a dry dressing each time. ° °ACTIVITY °Walk with your walker as instructed. °Use walker as long as suggested by your caregivers. °Avoid periods of inactivity such as sitting longer than an hour when not asleep. This helps prevent blood clots.  °You may resume a sexual relationship in one month or when given the OK by your doctor.  °You may return to work once you are cleared by your doctor.  °Do not drive a car for 6 weeks or until released by you surgeon.  °Do not drive while taking narcotics. ° °WEIGHT BEARING °Weight bearing as tolerated with assist device (walker, cane, etc) as directed, use it as long as suggested by your surgeon or therapist, typically at least 4-6 weeks. ° °POSTOPERATIVE CONSTIPATION PROTOCOL °Constipation - defined medically as fewer than three stools per week and severe constipation as less than one stool per week. ° °One of the most common issues patients have following surgery is constipation.  Even if you have a regular bowel pattern at home, your normal regimen is likely to be disrupted due to multiple reasons following surgery.  Combination of anesthesia, postoperative narcotics, change in   appetite and fluid intake all can affect your bowels.  In order to avoid complications following surgery, here are some recommendations in order to help you during your recovery period. ° °Colace (docusate) - Pick up an over-the-counter form of Colace or another stool softener and take twice a day as long as you are requiring postoperative pain medications.  Take with a full glass of water daily.  If you experience loose stools or diarrhea, hold the colace until you stool  forms back up.  If your symptoms do not get better within 1 week or if they get worse, check with your doctor. ° °Dulcolax (bisacodyl) - Pick up over-the-counter and take as directed by the product packaging as needed to assist with the movement of your bowels.  Take with a full glass of water.  Use this product as needed if not relieved by Colace only.  ° °MiraLax (polyethylene glycol) - Pick up over-the-counter to have on hand.  MiraLax is a solution that will increase the amount of water in your bowels to assist with bowel movements.  Take as directed and can mix with a glass of water, juice, soda, coffee, or tea.  Take if you go more than two days without a movement. °Do not use MiraLax more than once per day. Call your doctor if you are still constipated or irregular after using this medication for 7 days in a row. ° °If you continue to have problems with postoperative constipation, please contact the office for further assistance and recommendations.  If you experience "the worst abdominal pain ever" or develop nausea or vomiting, please contact the office immediatly for further recommendations for treatment. ° °ITCHING ° If you experience itching with your medications, try taking only a single pain pill, or even half a pain pill at a time.  You can also use Benadryl over the counter for itching or also to help with sleep.  ° °TED HOSE STOCKINGS °Wear the elastic stockings on both legs for six weeks following surgery during the day but you may remove then at night for sleeping. ° °MEDICATIONS °See your medication summary on the “After Visit Summary” that the nursing staff will review with you prior to discharge.  You may have some home medications which will be placed on hold until you complete the course of blood thinner medication.  It is important for you to complete the blood thinner medication as prescribed by your surgeon.  Continue your approved medications as instructed at time of  discharge. ° °PRECAUTIONS °If you experience chest pain or shortness of breath - call 911 immediately for transfer to the hospital emergency department.  °If you develop a fever greater that 101 F, purulent drainage from wound, increased redness or drainage from wound, foul odor from the wound/dressing, or calf pain - CONTACT YOUR SURGEON.   °                                                °FOLLOW-UP APPOINTMENTS °Make sure you keep all of your appointments after your operation with your surgeon and caregivers. You should call the office at the above phone number and make an appointment for approximately two weeks after the date of your surgery or on the date instructed by your surgeon outlined in the "After Visit Summary". ° ° °RANGE OF MOTION AND STRENGTHENING EXERCISES  °Rehabilitation   of the knee is important following a knee injury or an operation. After just a few days of immobilization, the muscles of the thigh which control the knee become weakened and shrink (atrophy). Knee exercises are designed to build up the tone and strength of the thigh muscles and to improve knee motion. Often times heat used for twenty to thirty minutes before working out will loosen up your tissues and help with improving the range of motion but do not use heat for the first two weeks following surgery. These exercises can be done on a training (exercise) mat, on the floor, on a table or on a bed. Use what ever works the best and is most comfortable for you Knee exercises include:  °Leg Lifts - While your knee is still immobilized in a splint or cast, you can do straight leg raises. Lift the leg to 60 degrees, hold for 3 sec, and slowly lower the leg. Repeat 10-20 times 2-3 times daily. Perform this exercise against resistance later as your knee gets better.  °Quad and Hamstring Sets - Tighten up the muscle on the front of the thigh (Quad) and hold for 5-10 sec. Repeat this 10-20 times hourly. Hamstring sets are done by pushing the  foot backward against an object and holding for 5-10 sec. Repeat as with quad sets.  °· Leg Slides: Lying on your back, slowly slide your foot toward your buttocks, bending your knee up off the floor (only go as far as is comfortable). Then slowly slide your foot back down until your leg is flat on the floor again. °· Angel Wings: Lying on your back spread your legs to the side as far apart as you can without causing discomfort.  °A rehabilitation program following serious knee injuries can speed recovery and prevent re-injury in the future due to weakened muscles. Contact your doctor or a physical therapist for more information on knee rehabilitation.  ° °IF YOU ARE TRANSFERRED TO A SKILLED REHAB FACILITY °If the patient is transferred to a skilled rehab facility following release from the hospital, a list of the current medications will be sent to the facility for the patient to continue.  When discharged from the skilled rehab facility, please have the facility set up the patient's Home Health Physical Therapy prior to being released. Also, the skilled facility will be responsible for providing the patient with their medications at time of release from the facility to include their pain medication, the muscle relaxants, and their blood thinner medication. If the patient is still at the rehab facility at time of the two week follow up appointment, the skilled rehab facility will also need to assist the patient in arranging follow up appointment in our office and any transportation needs. ° °MAKE SURE YOU:  °Understand these instructions.  °Get help right away if you are not doing well or get worse.  ° ° °Pick up stool softner and laxative for home use following surgery while on pain medications. °Do not submerge incision under water. °Please use good hand washing techniques while changing dressing each day. °May shower starting three days after surgery. °Please use a clean towel to pat the incision dry following  showers. °Continue to use ice for pain and swelling after surgery. °Do not use any lotions or creams on the incision until instructed by your surgeon. °

## 2016-01-20 NOTE — Progress Notes (Signed)
RN changed patient's honeycomb dressing per order. Old dressing had small amount of old serosanguinous drainage. Surgical site is clean dry and intact.  [name], RN
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
## 2016-01-25 ENCOUNTER — Telehealth: Payer: Self-pay | Admitting: Family Medicine

## 2016-01-25 ENCOUNTER — Encounter: Payer: Self-pay | Admitting: Primary Care

## 2016-01-25 ENCOUNTER — Ambulatory Visit (INDEPENDENT_AMBULATORY_CARE_PROVIDER_SITE_OTHER): Payer: Medicare HMO | Admitting: Primary Care

## 2016-01-25 DIAGNOSIS — I10 Essential (primary) hypertension: Secondary | ICD-10-CM | POA: Diagnosis not present

## 2016-01-25 NOTE — Telephone Encounter (Signed)
Patient Name: KAELIN DEMAN  DOB: 07/16/1945    Initial Comment Caller states her husband had knee replacement surgery a week ago. BP is 202/122. Left leg was swelled some yesterday.   Nurse Assessment  Nurse: Leilani Merl, RN, Heather Date/Time (Eastern Time): 01/25/2016 9:49:15 AM  Confirm and document reason for call. If symptomatic, describe symptoms. You must click the next button to save text entered. ---Caller states her husband had knee replacement surgery a week ago. BP is 202/122.  Has the patient traveled out of the country within the last 30 days? ---Not Applicable  Does the patient have any new or worsening symptoms? ---Yes  Will a triage be completed? ---Yes  Related visit to physician within the last 2 weeks? ---No  Does the PT have any chronic conditions? (i.e. diabetes, asthma, etc.) ---Yes  List chronic conditions. ---See MR  Is this a behavioral health or substance abuse call? ---No     Guidelines    Guideline Title Affirmed Question Affirmed Notes  High Blood Pressure [1] BP ? 200/120 AND [2] having NO cardiac or neurologic symptoms    Final Disposition User   See Physician within 4 Hours (or PCP triage) Leilani Merl, RN, Heather    Comments  Appt for today at 1045 with Alma Friendly.   Referrals  REFERRED TO PCP OFFICE   Disagree/Comply: Comply

## 2016-01-25 NOTE — Assessment & Plan Note (Signed)
Recent elevations in blood pressure at home and also in the office today. Highly suspect this is secondary to use of Claritin-D and will have him discontinue immediately. He will continue lisinopril 5 mg twice daily, continue to monitor his blood pressure, taper down to his normal dose of lisinopril as his blood pressure improves. He is asymptomatic and has an unremarkable exam.  We will call and check on him Friday this week to ensure his blood pressure is coming down. Strict return precautions provided.
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
## 2016-01-25 NOTE — Progress Notes (Signed)
Pre visit review using our clinic review tool, if applicable. No additional management support is needed unless otherwise documented below in the visit note. 

## 2016-01-25 NOTE — Telephone Encounter (Signed)
Noted and evaluated. 

## 2016-01-25 NOTE — Patient Instructions (Signed)
Your blood pressure elevation is likely due to consumption of Claritin-D. Stop taking Claritin-D now.  Continue using Flonase (fluticasone) nasal spray. Instill 1 spray in each nostril twice daily. Start plain Claritin which can be purchased over the counter.  Continue Lisinopril 5 mg twice daily for your high blood pressure and reduce down to 2.5 mg once daily once your blood pressure levels have improved. Your blood pressure should run less than 140/90.  Please call me immediately if you develop dizziness, chest pain, shortness of breath, visual changes. We will give you a call on Friday for an update on your blood pressure readings.  It was a pleasure meeting you!

## 2016-01-27 ENCOUNTER — Telehealth: Payer: Self-pay | Admitting: Primary Care

## 2016-01-27 NOTE — Telephone Encounter (Signed)
Much better and should continue to improve. Patient is aware of return precautions.

## 2016-01-27 NOTE — Telephone Encounter (Signed)
BP Reading  143/74 144/119 143/74 175/90 165/95   Patient is taking Lisinopril 5 mg BID

## 2016-01-27 NOTE — Telephone Encounter (Signed)
-----   Message from Pleas Koch, NP sent at 01/25/2016 11:26 AM EDT ----- Regarding: BP Check on patient's BP. How much Lisinopril is he taking?

## 2016-03-15 ENCOUNTER — Telehealth: Payer: Self-pay | Admitting: Pharmacist

## 2016-03-15 NOTE — Telephone Encounter (Signed)
Received fax from [employer] support regarding re-enrollment in the Patient Assistance Program.  The letter states they need both the patient and provider portion of the application.    Last visit: 11/24/15 Next visit: 04/17/16 Labs: 01/20/16 - BMP with glucose 203, calcium 8.3; CBC with WBC 10.8, Hgb 10.9  Okay to re-enroll patient in [place] patient assistance program.  Application was signed by Dr. Estanislado Pandy and faxed.  Will upload into patient's chart.    I called patient to update him and discuss his portion of the application.  Patient confirms he sent his portion of the application to the program a few days ago.  Patient denied any questions or concerns regarding his medications at this time.    Elisabeth Most, Pharm.D., BCPS Clinical Pharmacist Pager: [phone] Phone: [phone] 03/15/2016 3:37 PM

## 2016-04-03 ENCOUNTER — Telehealth: Payer: Self-pay

## 2016-04-03 NOTE — Telephone Encounter (Signed)
Spoke with a representative from [employer] to check the status of the patients application. They still need page 3 (the provider portion) of the application. It was originally sent on 12/14 but they did not show any records of it.   Form was faxed again today. Will check back.   Naijah Lacek, [place], CPhT

## 2016-04-04 NOTE — Telephone Encounter (Signed)
After checking on the status of his application, the provider form that was faxed was too dark and they were not able to read the information.  A new form was printed and signed by Dr. Estanislado Pandy.  Will submit provider form again.

## 2016-04-10 DIAGNOSIS — N200 Calculus of kidney: Secondary | ICD-10-CM | POA: Diagnosis not present

## 2016-04-10 DIAGNOSIS — R310 Gross hematuria: Secondary | ICD-10-CM | POA: Diagnosis not present

## 2016-04-13 ENCOUNTER — Telehealth: Payer: Self-pay

## 2016-04-13 NOTE — Telephone Encounter (Signed)
Spoke with Lonn Georgia from [place] Patient Assistance Program regarding patients application status. She states that all documents have been received and will now go through the benefits investigation process. This usually takes up to 36 hours. Will check back next week   Reference number: [id number]  Demetrios Loll, CPhT

## 2016-04-16 ENCOUNTER — Encounter: Payer: Self-pay | Admitting: Pulmonary Disease

## 2016-04-17 ENCOUNTER — Encounter: Payer: Self-pay | Admitting: Internal Medicine

## 2016-04-17 ENCOUNTER — Ambulatory Visit (INDEPENDENT_AMBULATORY_CARE_PROVIDER_SITE_OTHER): Payer: Medicare HMO | Admitting: Rheumatology

## 2016-04-17 ENCOUNTER — Ambulatory Visit (INDEPENDENT_AMBULATORY_CARE_PROVIDER_SITE_OTHER): Payer: Medicare HMO | Admitting: Internal Medicine

## 2016-04-17 ENCOUNTER — Encounter (INDEPENDENT_AMBULATORY_CARE_PROVIDER_SITE_OTHER): Payer: Self-pay

## 2016-04-17 ENCOUNTER — Encounter: Payer: Self-pay | Admitting: Rheumatology

## 2016-04-17 VITALS — BP 128/78 | HR 97 | Ht 69.5 in | Wt 288.4 lb

## 2016-04-17 VITALS — BP 130/74 | HR 74 | Resp 18 | Ht 69.5 in | Wt 292.0 lb

## 2016-04-17 DIAGNOSIS — IMO0001 Reserved for inherently not codable concepts without codable children: Secondary | ICD-10-CM

## 2016-04-17 DIAGNOSIS — M19041 Primary osteoarthritis, right hand: Secondary | ICD-10-CM

## 2016-04-17 DIAGNOSIS — L4050 Arthropathic psoriasis, unspecified: Secondary | ICD-10-CM

## 2016-04-17 DIAGNOSIS — M19042 Primary osteoarthritis, left hand: Secondary | ICD-10-CM

## 2016-04-17 DIAGNOSIS — E669 Obesity, unspecified: Secondary | ICD-10-CM | POA: Diagnosis not present

## 2016-04-17 DIAGNOSIS — Z79899 Other long term (current) drug therapy: Secondary | ICD-10-CM

## 2016-04-17 DIAGNOSIS — I10 Essential (primary) hypertension: Secondary | ICD-10-CM | POA: Diagnosis not present

## 2016-04-17 DIAGNOSIS — Z6841 Body Mass Index (BMI) 40.0 and over, adult: Secondary | ICD-10-CM

## 2016-04-17 DIAGNOSIS — L408 Other psoriasis: Secondary | ICD-10-CM | POA: Diagnosis not present

## 2016-04-17 DIAGNOSIS — I480 Paroxysmal atrial fibrillation: Secondary | ICD-10-CM | POA: Diagnosis not present

## 2016-04-17 MED ORDER — LISINOPRIL 2.5 MG PO TABS: 2.5000 mg | ORAL_TABLET | Freq: Every day | ORAL | 3 refills | Status: DC

## 2016-04-17 MED ORDER — ELIQUIS 5 MG PO TABS: 5.0000 mg | ORAL_TABLET | Freq: Two times a day (BID) | ORAL | 3 refills | Status: DC

## 2016-04-17 MED ORDER — CLOBETASOL PROPIONATE 0.05 % EX CREA: 1.0000 "application " | TOPICAL_CREAM | Freq: Two times a day (BID) | CUTANEOUS | 2 refills | Status: DC

## 2016-04-17 MED ORDER — VERAPAMIL HCL 120 MG PO TABS: 120.0000 mg | ORAL_TABLET | Freq: Two times a day (BID) | ORAL | 3 refills | Status: DC

## 2016-04-17 MED ORDER — FUROSEMIDE 40 MG PO TABS: 40.0000 mg | ORAL_TABLET | Freq: Every morning | ORAL | 3 refills | Status: DC

## 2016-04-17 NOTE — Patient Instructions (Addendum)
Medication Instructions:    Your physician has recommended you make the following change in your medication:  1) STOP Amiodarone 2) START Verapamil 120 mg twice a day  --- If you need a refill on your cardiac medications before your next appointment, please call your pharmacy. ---  Labwork:  None ordered  Testing/Procedures:  None ordered  Follow-Up:  Your physician recommends that you schedule a follow-up appointment in: 2-3 weeks for a nurse visit EKG   Your physician recommends that you schedule a follow-up appointment in: 3 months with Dr. Lovena Le.  Thank you for choosing [facility]!!    Any Other Special Instructions Will Be Listed Below (If Applicable).  Verapamil tablets What is this medicine? VERAPAMIL (ver AP a mil) is a calcium-channel blocker. It affects the amount of calcium found in your heart and muscle cells. This relaxes your blood vessels, which can reduce the amount of work the heart has to do. This medicine is used to treat chest pain caused by angina, high blood pressure, and controls heart rate in certain conditions. This medicine may be used for other purposes; ask your health care provider or pharmacist if you have questions. COMMON BRAND NAME(S): Calan What should I tell my health care provider before I take this medicine? They need to know if you have any of these conditions: -heart or blood vessel disease -heart rhythm disturbances such as sick sinus syndrome, ventricular arrhythmias, Wolff-Parkinson-White syndrome, or Lown-Ganong-Levine syndrome -liver or kidney disease -low blood pressure -an unusual or allergic reaction to verapamil, other medicines, foods, dyes, or preservatives -pregnant or trying to get pregnant -breast-feeding How should I use this medicine? Take this medicine by mouth with a glass of water. Follow the directions on the prescription label. This medicine can be taken with or without food. Take your doses at regular  intervals. Do not take your medicine more often than directed. Talk to your pediatrician regarding the use of this medicine in children. Special care may be needed. Overdosage: If you think you have taken too much of this medicine contact a poison control center or emergency room at once. NOTE: This medicine is only for you. Do not share this medicine with others. What if I miss a dose? If you miss a dose, take it as soon as you can. If it is almost time for your next dose, take only that dose. Do not take double or extra doses. What may interact with this medicine? Do not take this medicine with any of the following: -cisapride -disopyramide -dofetilide -grapefruit juice -hawthorn -pimozide -red yeast rice This medicine may also interact with the following medications: -barbiturates such as phenobarbital -cimetidine -cyclosporine -lithium -local anesthetics or general anesthetics -medicines for heart rhythm problems like amiodarone, digoxin, flecainide, procainamide, quinidine -medicines for high blood pressure or heart problems -medicines for seizures like carbamazepine and phenytoin -rifampin, rifabutin or rifapentine -theophylline or aminophylline This list may not describe all possible interactions. Give your health care provider a list of all the medicines, herbs, non-prescription drugs, or dietary supplements you use. Also tell them if you smoke, drink alcohol, or use illegal drugs. Some items may interact with your medicine. What should I watch for while using this medicine? Check your blood pressure and pulse rate regularly. Ask your doctor or health care professional what your blood pressure and pulse rate should be and when you should contact him or her. Do not suddenly stop taking this medicine. Ask your doctor or health care professional how to gradually  reduce the dose. You may get drowsy or dizzy. Do not drive, use machinery, or do anything that needs mental alertness  until you know how this medicine affects you. Do not stand or sit up quickly, especially if you are an older patient. This reduces the risk of dizzy or fainting spells. Alcohol may interfere with the effect of this medicine. Avoid alcoholic drinks. What side effects may I notice from receiving this medicine? Side effects that you should report to your doctor or health care professional as soon as possible: -difficulty breathing -dizziness or light headedness -fainting -fast heartbeat, palpitations, irregular heartbeat, or chest pain -skin rash -slow heartbeat -swelling of the legs or ankles Side effects that usually do not require medical attention (report to your doctor or health care professional if they continue or are bothersome): -constipation -facial flushing -headache -nausea, vomiting -sexual dysfunction -weakness or tiredness This list may not describe all possible side effects. Call your doctor for medical advice about side effects. You may report side effects to FDA at 1-800-FDA-1088. Where should I keep my medicine? Keep out of the reach of children. Store at room temperature between 15 and 25 degrees C (59 and 77 degrees F). Protect from light. Keep container tightly closed. Throw away any unused medicine after the expiration date. NOTE: This sheet is a summary. It may not cover all possible information. If you have questions about this medicine, talk to your doctor, pharmacist, or health care provider.  2017 Elsevier/Gold Standard (2007-12-15 17:23:53)
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
## 2016-04-17 NOTE — Progress Notes (Signed)
Pharmacy Note:  Spoke to [name] regarding [name] patient assistance program.  I informed patient that [name], Pharmacy technician, called to check on the status of his application this morning and it is still pending.  [name] confirms he still has several weeks of medications remaining at this time.  Advised him we will continue to check on the status of his application and will update him as soon as we know more information.  Patient voiced understanding and denied any further questions or concerns.    Elisabeth Most, Pharm.D., BCPS, CPP Clinical Pharmacist Pager: [phone] Phone: [phone] 04/17/2016 9:47 AM

## 2016-04-18 ENCOUNTER — Ambulatory Visit: Payer: Medicare HMO | Admitting: Pulmonary Disease

## 2016-04-24 ENCOUNTER — Telehealth: Payer: Self-pay

## 2016-04-24 DIAGNOSIS — N200 Calculus of kidney: Secondary | ICD-10-CM | POA: Diagnosis not present

## 2016-04-24 DIAGNOSIS — R3129 Other microscopic hematuria: Secondary | ICD-10-CM | POA: Diagnosis not present

## 2016-04-24 NOTE — Telephone Encounter (Signed)
Spoke with [name], a representative from [employer] regarding patients application. [name] was approved for the calendar year 2018. His first shipment was sent out on 04/20/16.  Reference [phone]  Will send documents to scan center when we receive them.   [name], [place], CPhT

## 2016-04-25 NOTE — Progress Notes (Signed)
Spoke with representative Gwyndolyn Saxon) on 04/25/15 from [facility] support plus who says that the RX that was originally sent with the application for [id number] did not have the dates by the prescribers signature.  The RX was dated and faxed back to the them at [phone].  Reference number: [id number] Phone: [phone]  Will send duplicate document with date to scan center.  [name], [place]

## 2016-04-30 ENCOUNTER — Telehealth: Payer: Self-pay | Admitting: Pulmonary Disease

## 2016-04-30 NOTE — Telephone Encounter (Signed)
Patient had an appt scheduled for 04/18/16 but it was cancelled due to snow. DL was printed. Per Dr. Elsworth Soho the DL showed excellent usage. Patient currently has an appt with RA on 05/1316.

## 2016-05-08 ENCOUNTER — Ambulatory Visit (INDEPENDENT_AMBULATORY_CARE_PROVIDER_SITE_OTHER): Payer: Medicare Other | Admitting: *Deleted

## 2016-05-08 VITALS — BP 118/68 | HR 63 | Wt 288.8 lb

## 2016-05-08 DIAGNOSIS — I480 Paroxysmal atrial fibrillation: Secondary | ICD-10-CM

## 2016-05-08 NOTE — Progress Notes (Signed)
1.) Reason for visit: EKG  2.) Name of MD requesting visit: Dr. Lovena Le  3.) H&P: at last OV EKG showed AFIB, HR 97, amiodarone was discontinued; verapamil 120 mg twice daily was started   4.) ROS related to problem: ekg today is afib, HR 63; pt reports that a few times per week his systolic BP is in the [id number], he may feel fatigued with this but denies any dizziness  5.) Assessment and plan per MD: reviewed with Dr.  Curt Bears, DOD; no changes, continue same meds and keep planned follow up.

## 2016-05-09 DIAGNOSIS — N400 Enlarged prostate without lower urinary tract symptoms: Secondary | ICD-10-CM | POA: Diagnosis not present

## 2016-05-09 DIAGNOSIS — N200 Calculus of kidney: Secondary | ICD-10-CM | POA: Diagnosis not present

## 2016-05-09 DIAGNOSIS — R310 Gross hematuria: Secondary | ICD-10-CM | POA: Diagnosis not present

## 2016-05-15 ENCOUNTER — Encounter: Payer: Self-pay | Admitting: Pulmonary Disease

## 2016-05-15 ENCOUNTER — Ambulatory Visit (INDEPENDENT_AMBULATORY_CARE_PROVIDER_SITE_OTHER): Payer: Medicare Other | Admitting: Pulmonary Disease

## 2016-05-15 DIAGNOSIS — G4733 Obstructive sleep apnea (adult) (pediatric): Secondary | ICD-10-CM

## 2016-05-15 DIAGNOSIS — G2581 Restless legs syndrome: Secondary | ICD-10-CM | POA: Diagnosis not present

## 2016-05-15 NOTE — Patient Instructions (Signed)
We will contact. [facility] To check your machine out Get back on Flonase and Claritin-D  Refills on Requip for one year
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
## 2016-05-15 NOTE — Addendum Note (Signed)
Addended by: Valerie Salts on: 05/15/2016 03:33 PM   Modules accepted: Orders

## 2016-05-15 NOTE — Assessment & Plan Note (Signed)
Refills on Requip for one year

## 2016-05-15 NOTE — Assessment & Plan Note (Signed)
We will contact. [facility] To check your machine out Get back on Flonase and Claritin-D  Weight loss encouraged, compliance with goal of at least 4-6 hrs every night is the expectation. Advised against medications with sedative side effects Cautioned against driving when sleepy - understanding that sleepiness will vary on a day to day basis

## 2016-05-22 ENCOUNTER — Telehealth: Payer: Self-pay | Admitting: Pulmonary Disease

## 2016-05-22 MED ORDER — ROPINIROLE HCL 0.5 MG PO TABS: ORAL_TABLET | ORAL | 3 refills | Status: DC

## 2016-05-22 NOTE — Telephone Encounter (Signed)
Spoke with pt. He is needing a refill on Requip. Per RA's last AVS instructions, this should have been refilled at his last OV on 05/15/16. Rx has been sent in per RA. Nothing further was needed.

## 2016-06-07 ENCOUNTER — Ambulatory Visit (INDEPENDENT_AMBULATORY_CARE_PROVIDER_SITE_OTHER): Payer: Medicare Other | Admitting: Internal Medicine

## 2016-06-07 ENCOUNTER — Ambulatory Visit: Payer: Medicare Other | Admitting: Nurse Practitioner

## 2016-06-07 ENCOUNTER — Encounter: Payer: Self-pay | Admitting: Internal Medicine

## 2016-06-07 VITALS — BP 142/86 | HR 61 | Ht 69.5 in | Wt 293.0 lb

## 2016-06-07 DIAGNOSIS — I480 Paroxysmal atrial fibrillation: Secondary | ICD-10-CM | POA: Diagnosis not present

## 2016-06-07 DIAGNOSIS — R072 Precordial pain: Secondary | ICD-10-CM | POA: Diagnosis not present
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
## 2016-06-07 NOTE — Patient Instructions (Signed)
Your physician recommends that you schedule a follow-up appointment depending on test results.   Your physician recommends that you continue on your current medications as directed. Please refer to the Current Medication list given to you today.  Your physician has requested that you have an echocardiogram. Echocardiography is a painless test that uses sound waves to create images of your heart. It provides your doctor with information about the size and shape of your heart and how well your heart's chambers and valves are working. This procedure takes approximately one hour. There are no restrictions for this procedure.  If you need a refill on your cardiac medications before your next appointment, please call your pharmacy.  Thank you for choosing [facility]!

## 2016-06-26 ENCOUNTER — Other Ambulatory Visit: Payer: Self-pay

## 2016-06-26 ENCOUNTER — Ambulatory Visit (HOSPITAL_COMMUNITY): Payer: Medicare Other | Attending: Cardiology

## 2016-06-26 DIAGNOSIS — G4733 Obstructive sleep apnea (adult) (pediatric): Secondary | ICD-10-CM | POA: Insufficient documentation

## 2016-06-26 DIAGNOSIS — I2510 Atherosclerotic heart disease of native coronary artery without angina pectoris: Secondary | ICD-10-CM | POA: Diagnosis not present

## 2016-06-26 DIAGNOSIS — I4892 Unspecified atrial flutter: Secondary | ICD-10-CM | POA: Insufficient documentation

## 2016-06-26 DIAGNOSIS — Z87891 Personal history of nicotine dependence: Secondary | ICD-10-CM | POA: Insufficient documentation

## 2016-06-26 DIAGNOSIS — Z6841 Body Mass Index (BMI) 40.0 and over, adult: Secondary | ICD-10-CM | POA: Insufficient documentation

## 2016-06-26 DIAGNOSIS — E119 Type 2 diabetes mellitus without complications: Secondary | ICD-10-CM | POA: Insufficient documentation

## 2016-06-26 DIAGNOSIS — I4891 Unspecified atrial fibrillation: Secondary | ICD-10-CM | POA: Diagnosis not present

## 2016-06-26 DIAGNOSIS — I509 Heart failure, unspecified: Secondary | ICD-10-CM | POA: Diagnosis not present

## 2016-06-26 DIAGNOSIS — I110 Hypertensive heart disease with heart failure: Secondary | ICD-10-CM | POA: Diagnosis not present

## 2016-06-26 DIAGNOSIS — R072 Precordial pain: Secondary | ICD-10-CM | POA: Diagnosis not present

## 2016-06-26 LAB — ECHOCARDIOGRAM COMPLETE
Ao-asc: 35 cm
FS: 27 % — AB (ref 28–44)
IVS/LV PW RATIO, ED: 0.93
LA ID, A-P, ES: 53 mm
LA diam end sys: 53 mm
LA diam index: 2.17 cm/m2
LA vol A4C: 93.4 ml
LA vol index: 35.7 mL/m2
LA vol: 87.1 mL
LV PW d: 12.3 mm — AB (ref 0.6–1.1)
LVOT SV: 99 mL
LVOT VTI: 20.1 cm
LVOT area: 4.91 cm2
LVOT diameter: 25 mm
LVOT peak vel: 102 cm/s
Lateral S' vel: 11.7 cm/s
TAPSE: 22.5 mm

## 2016-07-09 ENCOUNTER — Telehealth: Payer: Self-pay | Admitting: Internal Medicine

## 2016-07-09 NOTE — Telephone Encounter (Signed)
Reviewed results of echo with patient including to work on diet and weight loss.  I advised that per Dr. Lovena Le, he may double his dose of lasix for 1 week to see if if helps with his SOB. I advised him to take 40 mg twice daily for 1 week and call back to report symptoms. He verbalized understanding and agreement with plan. I advised him that Dr. Lovena Le would like to see him back in the office in 3 months and that his scheduler would contact him with an appointment. The patient thanked me for the call.

## 2016-07-09 NOTE — Telephone Encounter (Signed)
Spoke with pt and made him aware we were waiting for Dr. Lovena Le to review his echo.  Advised we would call as soon as results were available.  Pt verbalized understanding.

## 2016-07-09 NOTE — Telephone Encounter (Signed)
New Message    Pt calling to follow up on results from echo. Requesting call back

## 2016-07-13 ENCOUNTER — Ambulatory Visit (INDEPENDENT_AMBULATORY_CARE_PROVIDER_SITE_OTHER): Payer: Medicare Other | Admitting: Family Medicine

## 2016-07-13 ENCOUNTER — Encounter: Payer: Self-pay | Admitting: Family Medicine

## 2016-07-13 DIAGNOSIS — J0100 Acute maxillary sinusitis, unspecified: Secondary | ICD-10-CM

## 2016-07-13 DIAGNOSIS — I480 Paroxysmal atrial fibrillation: Secondary | ICD-10-CM | POA: Diagnosis not present

## 2016-07-13 MED ORDER — AMOXICILLIN-POT CLAVULANATE 875-125 MG PO TABS: 1.0000 | ORAL_TABLET | Freq: Two times a day (BID) | ORAL | 0 refills | Status: DC

## 2016-07-13 MED ORDER — FUROSEMIDE 40 MG PO TABS: 40.0000 mg | ORAL_TABLET | Freq: Two times a day (BID) | ORAL | Status: DC

## 2016-07-13 NOTE — Progress Notes (Signed)
Pre visit review using our clinic review tool, if applicable. No additional management support is needed unless otherwise documented below in the visit note. 

## 2016-07-13 NOTE — Progress Notes (Signed)
I wished him a happy birthday, which is coming up.   His wife is getting out of a cast in the near future.    Nasal congestion/sinus problems.  Started about 5 days ago, worse in the meantime.  Cough and yellow/brown rhinorrhea.  Likely had a low grade temp.  Not improved in the meantime.  Taking mucinex w/o relief.  Facial pain, maxillary pain.  Upper tooth pain.  No vomiting, no diarrhea.    He was changed form amiodarone to verapamil in the meantime per cards and is on higher dose of lasix, 40mg  BID now.  He is going to f/u with cards.  I will defer.    Meds, vitals, and allergies reviewed.   ROS: Per HPI unless specifically indicated in ROS section   GEN: nad, alert and oriented HEENT: mucous membranes moist, tm w/o erythema, nasal exam w/o erythema, clear discharge noted,  OP with cobblestoning, sinuses ttp- B maxillary NECK: supple w/o LA CV: IRR, not tachy.  PULM: ctab, no inc wob EXT: no edema

## 2016-07-13 NOTE — Patient Instructions (Addendum)
Rest and fluids.   Start augmentin, update me as needed.  Take care.  Glad to see you.

## 2016-07-15 NOTE — Assessment & Plan Note (Signed)
Nontoxic. Okay for outpatient follow-up. Rest and fluids.   Start augmentin, update me as needed.  He agrees with plan.

## 2016-07-17 ENCOUNTER — Telehealth: Payer: Self-pay | Admitting: *Deleted

## 2016-07-17 ENCOUNTER — Encounter: Payer: Self-pay | Admitting: Internal Medicine

## 2016-07-17 DIAGNOSIS — R0602 Shortness of breath: Secondary | ICD-10-CM

## 2016-07-17 NOTE — Telephone Encounter (Signed)
Message   I have doubled the daily amount of Lasix, there has been no improvement in my shortness of breath and chest discomfort. Your nurse requested I inform you of my status.   Alvester Chou Cerda   07/17/16--copied from [employer] sent by patient today.  I spoke with patient. Patient states he increased lasix from 40 mg daily to 40 mg bid 07/09/16 per Dr Tanna Furry recommendation. Pt states weight has decreased 2-3 pounds since increasing lasix to 40 mg bid. Pt states he saw his PCP last week-no LE edema noted, BP was 130/78, HR 70. Pt states shortness of breath may be slightly improved, but overall about the same. Pt states his PCP prescribed antibiotic about 5 days ago for nasal congestion/sinus.  Pt advised to go back to usual dose of lasix 40 mg daily.  Pt advised to continue to monitor symptoms,  weigh daily, call if weight increases 2-3 pounds in 24 hours or 5 pounds in 1 week. Pt advised I will forward to Dr Lovena Le for review and recommendations. Pt aware we will contact him if Dr Lovena Le recommends to increase lasix from 40 mg daily back to 40 mg bid.

## 2016-07-24 NOTE — Telephone Encounter (Signed)
Please schedule PFT's with a [facility] and followup with pulmonary. GT

## 2016-07-25 NOTE — Telephone Encounter (Signed)
lmtcb

## 2016-07-26 NOTE — Telephone Encounter (Signed)
Advised of PFT recommendation.  Order placed in EPIC and pt understands office will call him to arrange. Pt just seen by pulmonology in Feb.  Would like to see what result shows before scheduling f/u w/ them.

## 2016-07-26 NOTE — Telephone Encounter (Signed)
Follow Up:; ° ° °Returning your call. °

## 2016-08-07 ENCOUNTER — Ambulatory Visit (HOSPITAL_COMMUNITY)
Admission: RE | Admit: 2016-08-07 | Discharge: 2016-08-07 | Disposition: A | Discharge status: Home / Self Care | Payer: Medicare Other | Source: Ambulatory Visit | Attending: Internal Medicine | Admitting: Internal Medicine

## 2016-08-07 DIAGNOSIS — R0602 Shortness of breath: Secondary | ICD-10-CM | POA: Insufficient documentation

## 2016-08-07 DIAGNOSIS — R942 Abnormal results of pulmonary function studies: Secondary | ICD-10-CM | POA: Insufficient documentation

## 2016-08-07 DIAGNOSIS — Z87891 Personal history of nicotine dependence: Secondary | ICD-10-CM | POA: Insufficient documentation

## 2016-08-07 LAB — PULMONARY FUNCTION TEST
DL/VA % pred: 76 %
DL/VA: 3.48 ml/min/mmHg/L
DLCO UNC: 16.97 ml/min/mmHg
DLCO unc % pred: 53 %
FEF 25-75 PRE: 3.69 L/s
FEF 25-75 Post: 4.47 L/sec
FEF2575-%Change-Post: 21 %
FEF2575-%PRED-POST: 183 %
FEF2575-%Pred-Pre: 151 %
FEV1-%Change-Post: 4 %
FEV1-%PRED-POST: 92 %
FEV1-%PRED-PRE: 88 %
FEV1-POST: 2.94 L
FEV1-Pre: 2.82 L
FEV1FVC-%Change-Post: 4 %
FEV1FVC-%PRED-PRE: 116 %
FEV6-%CHANGE-POST: 0 %
FEV6-%PRED-PRE: 79 %
FEV6-%Pred-Post: 79 %
FEV6-POST: 3.26 L
FEV6-Pre: 3.27 L
FEV6FVC-%PRED-POST: 106 %
FEV6FVC-%PRED-PRE: 106 %
FVC-%Change-Post: 0 %
FVC-%PRED-PRE: 75 %
FVC-%Pred-Post: 75 %
FVC-POST: 3.26 L
FVC-Pre: 3.27 L
PRE FEV6/FVC RATIO: 100 %
Post FEV1/FVC ratio: 90 %
Post FEV6/FVC ratio: 100 %
Pre FEV1/FVC ratio: 86 %
RV % PRED: 83 %
RV: 2.02 L
TLC % pred: 77 %
TLC: 5.36 L

## 2016-08-07 MED ORDER — ALBUTEROL SULFATE (2.5 MG/3ML) 0.083% IN NEBU
2.5000 mg | INHALATION_SOLUTION | Freq: Once | RESPIRATORY_TRACT | Status: AC
  Administered 2016-08-07: 2.5 mg via RESPIRATORY_TRACT

## 2016-08-15 ENCOUNTER — Encounter: Payer: Self-pay | Admitting: Internal Medicine

## 2016-08-16 ENCOUNTER — Telehealth: Payer: Self-pay

## 2016-08-16 DIAGNOSIS — Z01812 Encounter for preprocedural laboratory examination: Secondary | ICD-10-CM

## 2016-08-16 DIAGNOSIS — I480 Paroxysmal atrial fibrillation: Secondary | ICD-10-CM

## 2016-08-16 NOTE — Telephone Encounter (Signed)
Called, spoke to [name]. Informed Dr. Lovena Le stated to schedule right and left heart cath. Informed he may see Dr. Lovena Le, if he would like to discuss before scheduling. [name] stated he would like to schedule. Informed I [name] will need to come in for labs prior to cath. [name] will arrive tomorrow morning (08/16/16) at the [facility] office for labs. Right and left heart cath scheduled for 08/24/16, [name] arriving at 8:30 AM. Last dose of Eliquis PM 08/21/16. Informed for [name] nothing to eat after midnight the night prior to procedure 08/24/16. Informed may take meds morning of procedure with a sip of water except HOLD Lasix and Eliquis. Informed [name] to pick up instruction letter at our office tomorrow. [name] verbalized understanding.  [name] also requested to switch from Verapamil back to Amiodarone. [name] stated his a-fib was under control better while taking Amiodarone. Informed I will forward to Dr. Lovena Le to advise. [name] verbalized understanding.

## 2016-08-17 ENCOUNTER — Other Ambulatory Visit: Payer: Medicare Other | Admitting: *Deleted

## 2016-08-17 DIAGNOSIS — I470 Re-entry ventricular arrhythmia: Secondary | ICD-10-CM | POA: Diagnosis not present

## 2016-08-17 DIAGNOSIS — I10 Essential (primary) hypertension: Secondary | ICD-10-CM

## 2016-08-17 DIAGNOSIS — I483 Typical atrial flutter: Secondary | ICD-10-CM

## 2016-08-17 DIAGNOSIS — I472 Ventricular tachycardia: Secondary | ICD-10-CM | POA: Diagnosis not present

## 2016-08-17 DIAGNOSIS — I4729 Other ventricular tachycardia: Secondary | ICD-10-CM

## 2016-08-17 LAB — BASIC METABOLIC PANEL WITH GFR
BUN/Creatinine Ratio: 20 (ref 10–24)
BUN: 17 mg/dL (ref 8–27)
CO2: 24 mmol/L (ref 18–29)
Calcium: 9.3 mg/dL (ref 8.6–10.2)
Chloride: 100 mmol/L (ref 96–106)
Creatinine, Ser: 0.84 mg/dL (ref 0.76–1.27)
GFR calc Af Amer: 103 mL/min/1.73 (ref >59)
GFR calc non Af Amer: 89 mL/min/1.73 (ref >59)
Glucose: 226 mg/dL — ABNORMAL HIGH (ref 65–99)
Potassium: 4.5 mmol/L (ref 3.5–5.2)
Sodium: 141 mmol/L (ref 134–144)

## 2016-08-17 LAB — CBC WITH DIFFERENTIAL/PLATELET
BASOS ABS: 0 10*3/uL (ref 0.0–0.2)
Basos: 1 %
EOS (ABSOLUTE): 0.3 10*3/uL (ref 0.0–0.4)
Eos: 4 %
Hematocrit: 37.5 % (ref 37.5–51.0)
Hemoglobin: 12.7 g/dL — ABNORMAL LOW (ref 13.0–17.7)
IMMATURE GRANS (ABS): 0 10*3/uL (ref 0.0–0.1)
Immature Granulocytes: 0 %
LYMPHS: 23 %
Lymphocytes Absolute: 1.7 10*3/uL (ref 0.7–3.1)
MCH: 30.8 pg (ref 26.6–33.0)
MCHC: 33.9 g/dL (ref 31.5–35.7)
MCV: 91 fL (ref 79–97)
Monocytes Absolute: 0.4 10*3/uL (ref 0.1–0.9)
Monocytes: 6 %
NEUTROS ABS: 4.7 10*3/uL (ref 1.4–7.0)
Neutrophils: 66 %
Platelets: 229 10*3/uL (ref 150–379)
RBC: 4.13 x10E6/uL — ABNORMAL LOW (ref 4.14–5.80)
RDW: 13.3 % (ref 12.3–15.4)
WBC: 7.1 10*3/uL (ref 3.4–10.8)

## 2016-08-17 LAB — PROTIME-INR
INR: 1 (ref 0.8–1.2)
Prothrombin Time: 10.8 s (ref 9.1–12.0)

## 2016-08-18 NOTE — Telephone Encounter (Signed)
[name] situation is complicated. I would like him to wear a 24 hour holter monitor prior to his coming back to see me. GT

## 2016-08-20 NOTE — Telephone Encounter (Signed)
Called, pt unavailable. Left detailed vm. Informed to keep taking Verapamil and not to switch back to Amiodarone. Can discuss meds at next appt with Dr. Lovena Le.  Informed Dr. Lovena Le recommends pt wear a 24 hour holter monitor. Informed order has been place and someone from our office will call to set up appt. Informed to call our office with questions or concerns.

## 2016-08-24 ENCOUNTER — Encounter (HOSPITAL_COMMUNITY): Payer: Self-pay | Admitting: Cardiology

## 2016-08-24 ENCOUNTER — Encounter (HOSPITAL_COMMUNITY)
Admission: RE | Disposition: A | Discharge status: Home / Self Care | Payer: Self-pay | Source: Ambulatory Visit | Attending: Cardiology

## 2016-08-24 ENCOUNTER — Ambulatory Visit (HOSPITAL_COMMUNITY)
Admission: RE | Admit: 2016-08-24 | Discharge: 2016-08-24 | Disposition: A | Discharge status: Home / Self Care | Payer: Medicare Other | Source: Ambulatory Visit | Attending: Cardiology | Admitting: Cardiology

## 2016-08-24 DIAGNOSIS — G4730 Sleep apnea, unspecified: Secondary | ICD-10-CM | POA: Insufficient documentation

## 2016-08-24 DIAGNOSIS — Z6841 Body Mass Index (BMI) 40.0 and over, adult: Secondary | ICD-10-CM | POA: Insufficient documentation

## 2016-08-24 DIAGNOSIS — I481 Persistent atrial fibrillation: Secondary | ICD-10-CM | POA: Diagnosis not present

## 2016-08-24 DIAGNOSIS — I480 Paroxysmal atrial fibrillation: Secondary | ICD-10-CM | POA: Diagnosis present

## 2016-08-24 DIAGNOSIS — I4729 Other ventricular tachycardia: Secondary | ICD-10-CM

## 2016-08-24 DIAGNOSIS — I472 Ventricular tachycardia, unspecified: Secondary | ICD-10-CM

## 2016-08-24 DIAGNOSIS — I495 Sick sinus syndrome: Secondary | ICD-10-CM | POA: Diagnosis not present

## 2016-08-24 DIAGNOSIS — F419 Anxiety disorder, unspecified: Secondary | ICD-10-CM | POA: Diagnosis not present

## 2016-08-24 DIAGNOSIS — E559 Vitamin D deficiency, unspecified: Secondary | ICD-10-CM | POA: Insufficient documentation

## 2016-08-24 DIAGNOSIS — I5032 Chronic diastolic (congestive) heart failure: Secondary | ICD-10-CM | POA: Diagnosis present

## 2016-08-24 DIAGNOSIS — Z8249 Family history of ischemic heart disease and other diseases of the circulatory system: Secondary | ICD-10-CM | POA: Diagnosis not present

## 2016-08-24 DIAGNOSIS — Z7901 Long term (current) use of anticoagulants: Secondary | ICD-10-CM | POA: Insufficient documentation

## 2016-08-24 DIAGNOSIS — G2581 Restless legs syndrome: Secondary | ICD-10-CM | POA: Diagnosis not present

## 2016-08-24 DIAGNOSIS — I272 Pulmonary hypertension, unspecified: Secondary | ICD-10-CM | POA: Insufficient documentation

## 2016-08-24 DIAGNOSIS — I130 Hypertensive heart and chronic kidney disease with heart failure and stage 1 through stage 4 chronic kidney disease, or unspecified chronic kidney disease: Secondary | ICD-10-CM | POA: Diagnosis not present

## 2016-08-24 DIAGNOSIS — Z7951 Long term (current) use of inhaled steroids: Secondary | ICD-10-CM | POA: Insufficient documentation

## 2016-08-24 DIAGNOSIS — K219 Gastro-esophageal reflux disease without esophagitis: Secondary | ICD-10-CM | POA: Insufficient documentation

## 2016-08-24 DIAGNOSIS — R0609 Other forms of dyspnea: Secondary | ICD-10-CM | POA: Diagnosis not present

## 2016-08-24 DIAGNOSIS — I2510 Atherosclerotic heart disease of native coronary artery without angina pectoris: Secondary | ICD-10-CM | POA: Diagnosis not present

## 2016-08-24 DIAGNOSIS — G4733 Obstructive sleep apnea (adult) (pediatric): Secondary | ICD-10-CM | POA: Diagnosis present

## 2016-08-24 DIAGNOSIS — R079 Chest pain, unspecified: Secondary | ICD-10-CM | POA: Diagnosis not present

## 2016-08-24 DIAGNOSIS — F329 Major depressive disorder, single episode, unspecified: Secondary | ICD-10-CM | POA: Diagnosis not present

## 2016-08-24 DIAGNOSIS — N189 Chronic kidney disease, unspecified: Secondary | ICD-10-CM | POA: Insufficient documentation

## 2016-08-24 DIAGNOSIS — R0600 Dyspnea, unspecified: Secondary | ICD-10-CM | POA: Diagnosis present

## 2016-08-24 DIAGNOSIS — I10 Essential (primary) hypertension: Secondary | ICD-10-CM | POA: Diagnosis present

## 2016-08-24 DIAGNOSIS — E1122 Type 2 diabetes mellitus with diabetic chronic kidney disease: Secondary | ICD-10-CM | POA: Diagnosis not present

## 2016-08-24 DIAGNOSIS — Z87891 Personal history of nicotine dependence: Secondary | ICD-10-CM | POA: Insufficient documentation

## 2016-08-24 HISTORY — PX: RIGHT/LEFT HEART CATH AND CORONARY ANGIOGRAPHY: CATH118266

## 2016-08-24 LAB — POCT I-STAT 3, ART BLOOD GAS (G3+)
BICARBONATE: 25.2 mmol/L (ref 20.0–28.0)
O2 Saturation: 98 %
PO2 ART: 108 mmHg (ref 83.0–108.0)
TCO2: 26 mmol/L (ref 0–100)
pCO2 arterial: 40.9 mmHg (ref 32.0–48.0)
pH, Arterial: 7.399 (ref 7.350–7.450)

## 2016-08-24 LAB — POCT I-STAT 3, VENOUS BLOOD GAS (G3P V)
ACID-BASE EXCESS: 1 mmol/L (ref 0.0–2.0)
Bicarbonate: 26.4 mmol/L (ref 20.0–28.0)
O2 SAT: 67 %
PCO2 VEN: 45.4 mmHg (ref 44.0–60.0)
TCO2: 28 mmol/L (ref 0–100)
pH, Ven: 7.373 (ref 7.250–7.430)
pO2, Ven: 36 mmHg (ref 32.0–45.0)

## 2016-08-24 LAB — POCT ACTIVATED CLOTTING TIME: Activated Clotting Time: 158 seconds

## 2016-08-24 SURGERY — RIGHT/LEFT HEART CATH AND CORONARY ANGIOGRAPHY
Anesthesia: LOCAL

## 2016-08-24 MED ORDER — SODIUM CHLORIDE 0.9 % IV SOLN: 250.0000 mL | INTRAVENOUS | Status: DC | PRN

## 2016-08-24 MED ORDER — HEPARIN (PORCINE) IN NACL 2-0.9 UNIT/ML-% IJ SOLN
INTRAMUSCULAR | Status: AC
  Filled 2016-08-24: qty 1000

## 2016-08-24 MED ORDER — ASPIRIN 81 MG PO CHEW
81.0000 mg | CHEWABLE_TABLET | ORAL | Status: AC
  Administered 2016-08-24: 81 mg via ORAL

## 2016-08-24 MED ORDER — LIDOCAINE HCL (PF) 1 % IJ SOLN
INTRAMUSCULAR | Status: DC | PRN
Start: 2016-08-24 — End: 2016-08-24
  Administered 2016-08-24: 2 mL via INTRADERMAL

## 2016-08-24 MED ORDER — ASPIRIN 81 MG PO CHEW
CHEWABLE_TABLET | ORAL | Status: AC
  Filled 2016-08-24: qty 1

## 2016-08-24 MED ORDER — FENTANYL CITRATE (PF) 100 MCG/2ML IJ SOLN
INTRAMUSCULAR | Status: DC | PRN
  Administered 2016-08-24: 25 ug via INTRAVENOUS

## 2016-08-24 MED ORDER — FENTANYL CITRATE (PF) 100 MCG/2ML IJ SOLN
INTRAMUSCULAR | Status: AC
  Filled 2016-08-24: qty 2

## 2016-08-24 MED ORDER — VERAPAMIL HCL 2.5 MG/ML IV SOLN
INTRAVENOUS | Status: AC
  Filled 2016-08-24: qty 2

## 2016-08-24 MED ORDER — HEPARIN (PORCINE) IN NACL 2-0.9 UNIT/ML-% IJ SOLN
INTRAMUSCULAR | Status: AC | PRN
  Administered 2016-08-24: 1000 mL via INTRA_ARTERIAL

## 2016-08-24 MED ORDER — SODIUM CHLORIDE 0.9% FLUSH: 3.0000 mL | INTRAVENOUS | Status: DC | PRN

## 2016-08-24 MED ORDER — SODIUM CHLORIDE 0.9 % WEIGHT BASED INFUSION: 1.0000 mL/kg/h | INTRAVENOUS | Status: AC

## 2016-08-24 MED ORDER — HEPARIN SODIUM (PORCINE) 1000 UNIT/ML IJ SOLN
INTRAMUSCULAR | Status: AC
  Filled 2016-08-24: qty 1

## 2016-08-24 MED ORDER — SODIUM CHLORIDE 0.9 % WEIGHT BASED INFUSION
3.0000 mL/kg/h | INTRAVENOUS | Status: AC
  Administered 2016-08-24: 3 mL/kg/h via INTRAVENOUS

## 2016-08-24 MED ORDER — HEPARIN SODIUM (PORCINE) 1000 UNIT/ML IJ SOLN
INTRAMUSCULAR | Status: DC | PRN
  Administered 2016-08-24: 6000 [IU] via INTRAVENOUS

## 2016-08-24 MED ORDER — VERAPAMIL HCL 2.5 MG/ML IV SOLN
INTRAVENOUS | Status: DC | PRN
  Administered 2016-08-24: 10:00:00 via INTRA_ARTERIAL

## 2016-08-24 MED ORDER — MIDAZOLAM HCL 2 MG/2ML IJ SOLN
INTRAMUSCULAR | Status: AC
  Filled 2016-08-24: qty 2

## 2016-08-24 MED ORDER — SODIUM CHLORIDE 0.9% FLUSH: 3.0000 mL | Freq: Two times a day (BID) | INTRAVENOUS | Status: DC

## 2016-08-24 MED ORDER — LIDOCAINE HCL 1 % IJ SOLN
INTRAMUSCULAR | Status: AC
  Filled 2016-08-24: qty 20

## 2016-08-24 MED ORDER — IOPAMIDOL (ISOVUE-370) INJECTION 76%
INTRAVENOUS | Status: DC | PRN
  Administered 2016-08-24: 90 mL via INTRA_ARTERIAL

## 2016-08-24 MED ORDER — MIDAZOLAM HCL 2 MG/2ML IJ SOLN
INTRAMUSCULAR | Status: DC | PRN
  Administered 2016-08-24 (×2): 1 mg via INTRAVENOUS

## 2016-08-24 MED ORDER — SODIUM CHLORIDE 0.9 % WEIGHT BASED INFUSION: 1.0000 mL/kg/h | INTRAVENOUS | Status: DC

## 2016-08-24 MED ORDER — IOPAMIDOL (ISOVUE-370) INJECTION 76%
INTRAVENOUS | Status: AC
  Filled 2016-08-24: qty 100

## 2016-08-24 SURGICAL SUPPLY — 14 items
CATH INFINITI 5 FR JL3.5 (CATHETERS) ×2 IMPLANT
CATH INFINITI 5FR ANG PIGTAIL (CATHETERS) ×2 IMPLANT
CATH INFINITI JR4 5F (CATHETERS) ×2 IMPLANT
CATH SWAN GANZ 7F STRAIGHT (CATHETERS) ×2 IMPLANT
DEVICE RAD COMP TR BAND LRG (VASCULAR PRODUCTS) ×2 IMPLANT
GLIDESHEATH SLEND SS 6F .021 (SHEATH) ×2 IMPLANT
GUIDEWIRE INQWIRE 1.5J.035X260 (WIRE) ×1 IMPLANT
INQWIRE 1.5J .035X260CM (WIRE) ×2
KIT HEART LEFT (KITS) ×2 IMPLANT
PACK CARDIAC CATHETERIZATION (CUSTOM PROCEDURE TRAY) ×2 IMPLANT
SHEATH PINNACLE 7F 10CM (SHEATH) ×2 IMPLANT
SYR MEDRAD MARK V 150ML (SYRINGE) ×2 IMPLANT
TRANSDUCER W/STOPCOCK (MISCELLANEOUS) ×2 IMPLANT
TUBING CIL FLEX 10 FLL-RA (TUBING) ×2 IMPLANT

## 2016-08-24 NOTE — Interval H&P Note (Signed)
History and Physical Interval Note:  08/24/2016 9:34 AM  [name]  has presented today for surgery, with the diagnosis of afib  The various methods of treatment have been discussed with the patient and family. After consideration of risks, benefits and other options for treatment, the patient has consented to  Procedure(s): Right/Left Heart Cath and Coronary Angiography (N/A) as a surgical intervention .  The patient's history has been reviewed, patient examined, no change in status, stable for surgery.  I have reviewed the patient's chart and labs.  Questions were answered to the patient's satisfaction.   Cath Lab Visit (complete for each Cath Lab visit)  Clinical Evaluation Leading to the Procedure:   ACS: No.  Non-ACS:    Anginal Classification: CCS III  Anti-ischemic medical therapy: Minimal Therapy (1 class of medications)  Non-Invasive Test Results: No non-invasive testing performed  Prior CABG: No previous CABG        [name] [facility] 08/24/2016 9:35 AM

## 2016-08-24 NOTE — Discharge Instructions (Signed)
Radial Site Care Refer to this sheet in the next few weeks. These instructions provide you with information about caring for yourself after your procedure. Your health care provider may also give you more specific instructions. Your treatment has been planned according to current medical practices, but problems sometimes occur. Call your health care provider if you have any problems or questions after your procedure. What can I expect after the procedure? After your procedure, it is typical to have the following:  Bruising at the radial site that usually fades within 1-2 weeks.  Blood collecting in the tissue (hematoma) that may be painful to the touch. It should usually decrease in size and tenderness within 1-2 weeks. Follow these instructions at home:  Take medicines only as directed by your health care provider.  You may shower 24-48 hours after the procedure or as directed by your health care provider. Remove the bandage (dressing) and gently wash the site with plain soap and water. Pat the area dry with a clean towel. Do not rub the site, because this may cause bleeding.  Do not take baths, swim, or use a hot tub until your health care provider approves.  Check your insertion site every day for redness, swelling, or drainage.  Do not apply powder or lotion to the site.  Do not flex or bend the affected arm for 24 hours or as directed by your health care provider.  Do not push or pull heavy objects with the affected arm for 24 hours or as directed by your health care provider.  Do not lift over 10 lb (4.5 kg) for 5 days after your procedure or as directed by your health care provider.  Ask your health care provider when it is okay to:  Return to work or school.  Resume usual physical activities or sports.  Resume sexual activity.  Do not drive home if you are discharged the same day as the procedure. Have someone else drive you.  You may drive 24 hours after the procedure  unless otherwise instructed by your health care provider.  Do not operate machinery or power tools for 24 hours after the procedure.  If your procedure was done as an outpatient procedure, which means that you went home the same day as your procedure, a responsible adult should be with you for the first 24 hours after you arrive home.  Keep all follow-up visits as directed by your health care provider. This is important. Contact a health care provider if:  You have a fever.  You have chills.  You have increased bleeding from the radial site. Hold pressure on the site. Get help right away if:  You have unusual pain at the radial site.  You have redness, warmth, or swelling at the radial site.  You have drainage (other than a small amount of blood on the dressing) from the radial site.  The radial site is bleeding, and the bleeding does not stop after 30 minutes of holding steady pressure on the site.  Your arm or hand becomes pale, cool, tingly, or numb. This information is not intended to replace advice given to you by your health care provider. Make sure you discuss any questions you have with your health care provider. Document Released: 04/21/2010 Document Revised: 08/25/2015 Document Reviewed: 10/05/2013 Elsevier Interactive Patient Education  2017 [place] After This sheet gives you information about how to care for yourself after your procedure. Your doctor may also give you more specific  instructions. If you have problems or questions, contact your doctor. Follow these instructions at home: Insertion site care   Follow instructions from your doctor about how to take care of your long, thin tube (catheter) insertion area. Make sure you:  Wash your hands with soap and water before you change your bandage (dressing). If you cannot use soap and water, use hand sanitizer.  Change your bandage as told by your doctor.  Leave stitches (sutures), skin  glue, or skin tape (adhesive) strips in place. They may need to stay in place for 2 weeks or longer. If tape strips get loose and curl up, you may trim the loose edges. Do not remove tape strips completely unless your doctor says it is okay.  Do not take baths, swim, or use a hot tub until your doctor says it is okay.  You may shower 24-48 hours after the procedure or as told by your doctor.  Gently wash the area with plain soap and water.  Pat the area dry with a clean towel.  Do not rub the area. This may cause bleeding.  Do not apply powder or lotion to the area. Keep the area clean and dry.  Check your insertion area every day for signs of infection. Check for:  More redness, swelling, or pain.  Fluid or blood.  Warmth.  Pus or a bad smell. Activity   Rest as told by your doctor, usually for 1-2 days.  Do not lift anything that is heavier than 10 lbs. (4.5 kg) or as told by your doctor.  Do not drive for 24 hours if you were given a medicine to help you relax (sedative).  Do not drive or use heavy machinery while taking prescription pain medicine. General instructions   Go back to your normal activities as told by your doctor, usually in about a week. Ask your doctor what activities are safe for you.  If the insertion area starts to bleed, lie flat and put pressure on the area. If the bleeding does not stop, get help right away. This is an emergency.  Drink enough fluid to keep your pee (urine) clear or pale yellow.  Take over-the-counter and prescription medicines only as told by your doctor.  Keep all follow-up visits as told by your doctor. This is important. Contact a doctor if:  You have a fever.  You have chills.  You have more redness, swelling, or pain around your insertion area.  You have fluid or blood coming from your insertion area.  The insertion area feels warm to the touch.  You have pus or a bad smell coming from your insertion area.  You  have more bruising around the insertion area.  Blood collects in the tissue around the insertion area (hematoma) that may be painful to the touch. Get help right away if:  You have a lot of pain in the insertion area.  The insertion area swells very fast.  The insertion area is bleeding, and the bleeding does not stop after holding steady pressure on the area.  The area near or just beyond the insertion area becomes pale, cool, tingly, or numb. These symptoms may be an emergency. Do not wait to see if the symptoms will go away. Get medical help right away. Call your local emergency services (911 in the [place] Do not drive yourself to the hospital. Summary  After the procedure, it is common to have bruising and tenderness at the long, thin tube insertion area.  After  the procedure, it is important to rest and drink plenty of fluids.  Do not take baths, swim, or use a hot tub until your doctor says it is okay to do so. You may shower 24-48 hours after the procedure or as told by your doctor.  If the insertion area starts to bleed, lie flat and put pressure on the area. If the bleeding does not stop, get help right away. This is an emergency. This information is not intended to replace advice given to you by your health care provider. Make sure you discuss any questions you have with your health care provider. Document Released: 06/15/2008 Document Revised: 03/13/2016 Document Reviewed: 03/13/2016 Elsevier Interactive Patient Education  2017 [employer].

## 2016-08-24 NOTE — Progress Notes (Signed)
Pt ambulatory to bathroom tolerated well.  Right groin level 0.  Right wrist level 0 arm board placed.
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
## 2016-08-24 NOTE — Progress Notes (Signed)
Site area: Right groin 7 french venous sheath was removed  Site Prior to Removal:  Level 0  Pressure Applied For 15 MINUTES    Bedrest Beginning at  1120a  Manual:   Yes.    Patient Status During Pull:  stable  Post Pull Groin Site:  Level 0  Post Pull Instructions Given:  Yes.    Post Pull Pulses Present:  Yes.    Dressing Applied:  Yes.    Comments:  VS remain stable during sheath pull.

## 2016-08-31 ENCOUNTER — Ambulatory Visit (INDEPENDENT_AMBULATORY_CARE_PROVIDER_SITE_OTHER): Payer: Medicare Other

## 2016-08-31 DIAGNOSIS — I480 Paroxysmal atrial fibrillation: Secondary | ICD-10-CM

## 2016-08-31 NOTE — Telephone Encounter (Signed)
Patient has an appointment for 08/31/16 for monitor and follow up with Dr Lovena Le 09/07/16

## 2016-09-07 ENCOUNTER — Encounter: Payer: Self-pay | Admitting: Internal Medicine

## 2016-09-07 ENCOUNTER — Ambulatory Visit (INDEPENDENT_AMBULATORY_CARE_PROVIDER_SITE_OTHER): Payer: Medicare Other | Admitting: Internal Medicine

## 2016-09-07 VITALS — BP 126/82 | HR 61 | Ht 69.5 in | Wt 287.8 lb

## 2016-09-07 DIAGNOSIS — I480 Paroxysmal atrial fibrillation: Secondary | ICD-10-CM

## 2016-09-07 DIAGNOSIS — R0602 Shortness of breath: Secondary | ICD-10-CM | POA: Diagnosis not present

## 2016-09-07 MED ORDER — NEBIVOLOL HCL 5 MG PO TABS: 5.0000 mg | ORAL_TABLET | Freq: Every day | ORAL | 5 refills | Status: DC

## 2016-09-07 NOTE — Patient Instructions (Addendum)
Medication Instructions:  Your physician has recommended you make the following change in your medication:  START Bystolic 5 mg daily  Labwork: None Ordered   Testing/Procedures: None Ordered   Follow-Up: Your physician recommends that you schedule a follow-up appointment with Dr. Elsworth Soho   Your physician recommends that you schedule a follow-up appointment in: 3 months with Dr. Lovena Le     Any Other Special Instructions Will Be Listed Below (If Applicable). Maintain a low sodium diet    If you need a refill on your cardiac medications before your next appointment, please call your pharmacy.
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
## 2016-09-11 ENCOUNTER — Encounter: Payer: Self-pay | Admitting: Internal Medicine

## 2016-09-14 ENCOUNTER — Encounter: Payer: Self-pay | Admitting: Family Medicine

## 2016-09-14 ENCOUNTER — Ambulatory Visit (INDEPENDENT_AMBULATORY_CARE_PROVIDER_SITE_OTHER): Payer: Medicare Other | Admitting: Family Medicine

## 2016-09-14 VITALS — BP 104/66 | HR 104 | Temp 98.5°F | Wt 282.2 lb

## 2016-09-14 DIAGNOSIS — R197 Diarrhea, unspecified: Secondary | ICD-10-CM

## 2016-09-14 LAB — CBC WITH DIFFERENTIAL/PLATELET
BASOS PCT: 0.9 % (ref 0.0–3.0)
Basophils Absolute: 0.1 10*3/uL (ref 0.0–0.1)
EOS ABS: 0.2 10*3/uL (ref 0.0–0.7)
Eosinophils Relative: 2.3 % (ref 0.0–5.0)
HCT: 39.3 % (ref 39.0–52.0)
Hemoglobin: 13.3 g/dL (ref 13.0–17.0)
Lymphocytes Relative: 17.7 % (ref 12.0–46.0)
Lymphs Abs: 1.5 10*3/uL (ref 0.7–4.0)
MCHC: 34 g/dL (ref 30.0–36.0)
MCV: 90.6 fl (ref 78.0–100.0)
MONO ABS: 0.4 10*3/uL (ref 0.1–1.0)
Monocytes Relative: 5.2 % (ref 3.0–12.0)
Neutro Abs: 6.4 10*3/uL (ref 1.4–7.7)
Neutrophils Relative %: 73.9 % (ref 43.0–77.0)
Platelets: 266 10*3/uL (ref 150.0–400.0)
RBC: 4.33 Mil/uL (ref 4.22–5.81)
RDW: 13.8 % (ref 11.5–15.5)
WBC: 8.6 10*3/uL (ref 4.0–10.5)

## 2016-09-14 LAB — COMPREHENSIVE METABOLIC PANEL
ALT: 21 U/L (ref 0–53)
AST: 17 U/L (ref 0–37)
Albumin: 4.1 g/dL (ref 3.5–5.2)
Alkaline Phosphatase: 104 U/L (ref 39–117)
BUN: 19 mg/dL (ref 6–23)
CHLORIDE: 104 meq/L (ref 96–112)
CO2: 24 meq/L (ref 19–32)
CREATININE: 1.02 mg/dL (ref 0.40–1.50)
Calcium: 9.8 mg/dL (ref 8.4–10.5)
GFR: 76.71 mL/min (ref >60.00)
Glucose, Bld: 229 mg/dL — ABNORMAL HIGH (ref 70–99)
Potassium: 4.4 mEq/L (ref 3.5–5.1)
SODIUM: 139 meq/L (ref 135–145)
Total Bilirubin: 1.1 mg/dL (ref 0.2–1.2)
Total Protein: 7.8 g/dL (ref 6.0–8.3)
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
## 2016-09-14 NOTE — Progress Notes (Signed)
Belching and gas and diarrhea.  Indigestion.  Diarrhea going on for about 3 weeks.  No new medicines except for PPI and that was after the diarrhea started, but PPI isn't helping.  Hasn't started bystolic. No blood in stool.  No vomiting.  No fevers.  No jaundice.  Some abd bloating and cramping.  H/o H pylori in the distant past and had similar sx with that.  Diarrhea is happening about 8 times a day.  Liquid stools usually.  No mucous in stools.  No atypical foods.  No travel.  City water.  No one else is sick.   Tired imodium, no help.   Meds, vitals, and allergies reviewed.   ROS: Per HPI unless specifically indicated in ROS section   GEN: nad, alert and oriented HEENT: mucous membranes moist NECK: supple w/o LA CV: IRR, not tachy PULM: ctab, no inc wob ABD: soft, +bs, not ttp

## 2016-09-14 NOTE — Patient Instructions (Addendum)
Go to the lab on the way out.  We'll contact you with your lab report. Avoid dairy in the meantime.  Take care.  Glad to see you.

## 2016-09-16 DIAGNOSIS — R197 Diarrhea, unspecified: Secondary | ICD-10-CM | POA: Insufficient documentation

## 2016-09-16 NOTE — Assessment & Plan Note (Signed)
Discussed with patient. This could be due to H. pylori, but there are other possible causes including C. difficile. Differential discussed. We need extra information in the meantime. He is willing to put up with his symptoms for few more days to allow [place] to collect labs. I do not want to treat him for H. pylori without an attempted getting extra information. We can't do stool H. pylori testing because of his PPI use. Discussed. He is in agreement with plan. Okay for outpatient follow-up. >25 minutes spent in face to face time with patient, >50% spent in counselling or coordination of care.

## 2016-09-17 ENCOUNTER — Ambulatory Visit (INDEPENDENT_AMBULATORY_CARE_PROVIDER_SITE_OTHER): Payer: Medicare Other | Admitting: Rheumatology

## 2016-09-17 ENCOUNTER — Encounter: Payer: Self-pay | Admitting: Rheumatology

## 2016-09-17 VITALS — BP 134/78 | HR 84 | Resp 18 | Ht 69.5 in | Wt 288.0 lb

## 2016-09-17 DIAGNOSIS — M19041 Primary osteoarthritis, right hand: Secondary | ICD-10-CM | POA: Diagnosis not present

## 2016-09-17 DIAGNOSIS — L408 Other psoriasis: Secondary | ICD-10-CM | POA: Diagnosis not present

## 2016-09-17 DIAGNOSIS — R197 Diarrhea, unspecified: Secondary | ICD-10-CM | POA: Diagnosis not present

## 2016-09-17 DIAGNOSIS — M19042 Primary osteoarthritis, left hand: Secondary | ICD-10-CM | POA: Diagnosis not present

## 2016-09-17 DIAGNOSIS — Z79899 Other long term (current) drug therapy: Secondary | ICD-10-CM

## 2016-09-17 DIAGNOSIS — E669 Obesity, unspecified: Secondary | ICD-10-CM | POA: Diagnosis not present

## 2016-09-17 DIAGNOSIS — L4050 Arthropathic psoriasis, unspecified: Secondary | ICD-10-CM

## 2016-09-17 DIAGNOSIS — Z6841 Body Mass Index (BMI) 40.0 and over, adult: Secondary | ICD-10-CM | POA: Diagnosis not present

## 2016-09-17 DIAGNOSIS — IMO0001 Reserved for inherently not codable concepts without codable children: Secondary | ICD-10-CM

## 2016-09-17 NOTE — Addendum Note (Signed)
Addended by: Ellamae Sia on: 09/17/2016 11:35 AM   Modules accepted: Orders

## 2016-09-18 ENCOUNTER — Other Ambulatory Visit: Payer: Self-pay | Admitting: *Deleted

## 2016-09-18 LAB — C. DIFFICILE GDH AND TOXIN A/B
C. DIFF TOXIN A/B: NOT DETECTED
C. difficile GDH: NOT DETECTED

## 2016-09-18 MED ORDER — PRAVASTATIN SODIUM 20 MG PO TABS: 20.0000 mg | ORAL_TABLET | Freq: Every day | ORAL | 3 refills | Status: DC

## 2016-09-19 ENCOUNTER — Encounter: Payer: Self-pay | Admitting: Internal Medicine

## 2016-09-19 DIAGNOSIS — I498 Other specified cardiac arrhythmias: Secondary | ICD-10-CM

## 2016-09-19 LAB — H PYLORI, IGM, IGG, IGA AB
H pylori, IgM Abs: <9 units (ref 0.0–8.9)
H. pylori, IgA Abs: 10.8 units — ABNORMAL HIGH (ref 0.0–8.9)

## 2016-09-20 ENCOUNTER — Telehealth: Payer: Self-pay | Admitting: Family Medicine

## 2016-09-20 ENCOUNTER — Other Ambulatory Visit: Payer: Self-pay | Admitting: Family Medicine

## 2016-09-20 ENCOUNTER — Telehealth: Payer: Self-pay

## 2016-09-20 MED ORDER — TETRACYCLINE HCL 500 MG PO CAPS: 500.0000 mg | ORAL_CAPSULE | Freq: Four times a day (QID) | ORAL | 0 refills | Status: DC

## 2016-09-20 MED ORDER — METRONIDAZOLE 250 MG PO TABS: 250.0000 mg | ORAL_TABLET | Freq: Four times a day (QID) | ORAL | 0 refills | Status: DC

## 2016-09-20 MED ORDER — PRAVASTATIN SODIUM 20 MG PO TABS: 20.0000 mg | ORAL_TABLET | Freq: Every day | ORAL | 3 refills | Status: DC

## 2016-09-20 MED ORDER — BISMUTH SUBSALICYLATE 262 MG/15ML PO SUSP: 30.0000 mL | Freq: Four times a day (QID) | ORAL | Status: DC | PRN

## 2016-09-20 MED ORDER — OMEPRAZOLE 20 MG PO CPDR: 20.0000 mg | DELAYED_RELEASE_CAPSULE | Freq: Two times a day (BID) | ORAL | Status: DC

## 2016-09-20 NOTE — Telephone Encounter (Signed)
Jody with [facility] was on hold 25 mins at [facility] to get tetracycline,metronidazole and pravastatin transferred at pt request and then got cut off. jody request [facility] to send to [place]. I spoke with Adonis Brook at [address] and she will d/c the 3 meds. I sent electronically to [facility] [employer].

## 2016-09-20 NOTE — Telephone Encounter (Signed)
Opened in error

## 2016-09-21 ENCOUNTER — Telehealth: Payer: Self-pay | Admitting: *Deleted

## 2016-09-21 ENCOUNTER — Other Ambulatory Visit: Payer: Self-pay | Admitting: *Deleted

## 2016-09-21 DIAGNOSIS — F331 Major depressive disorder, recurrent, moderate: Secondary | ICD-10-CM | POA: Diagnosis not present

## 2016-09-21 LAB — STOOL CULTURE

## 2016-09-21 NOTE — Telephone Encounter (Signed)
Melanie spent quite a bit of time on the phone getting information about this and they are supposed to fax a form to our fax machine.

## 2016-09-21 NOTE — Telephone Encounter (Signed)
Form received for your completion, sig and date.

## 2016-09-21 NOTE — Telephone Encounter (Signed)
Form faxed

## 2016-09-21 NOTE — Telephone Encounter (Signed)
See his allergy list.  Please let me know what you find out about this.  Thanks.

## 2016-09-21 NOTE — Telephone Encounter (Signed)
Spoke to [place] at [employer] [employer] [employer] who states that pts tetracycline is not on the pts formulary as it is considered a acne med with Medicare. He is requesting a new Rx as pts cost would be $500 even with PA. pls advise

## 2016-09-21 NOTE — Telephone Encounter (Signed)
Done, thanks

## 2016-09-22 ENCOUNTER — Encounter: Payer: Self-pay | Admitting: Family Medicine

## 2016-09-24 ENCOUNTER — Encounter: Payer: Self-pay | Admitting: *Deleted

## 2016-09-24 ENCOUNTER — Other Ambulatory Visit: Payer: Self-pay | Admitting: *Deleted

## 2016-09-24 MED ORDER — TETRACYCLINE HCL 500 MG PO CAPS: 500.0000 mg | ORAL_CAPSULE | Freq: Four times a day (QID) | ORAL | 0 refills | Status: DC

## 2016-09-24 MED ORDER — METRONIDAZOLE 250 MG PO TABS: 250.0000 mg | ORAL_TABLET | Freq: Four times a day (QID) | ORAL | 0 refills | Status: DC

## 2016-09-24 NOTE — Telephone Encounter (Signed)
The drug that was approved by pt ins co still cost pt $174.00 which is too expensive for pt to pay. Pt request a different med sent to pharmacy. Pt has sent in 2 messages to [facility]; pt is tired of recordings and messages that are never answered. Pt request cb. I called pt at [phone]. Pt has already checked with [facility] on cash price and that cost was $500.00. I advised pt to ck with ins co to ck and see what  Substitute that is approved by his ins co would be more affordable for him (the pt). Pt said he is not a doctor or a pharmacist and he is not going to call the ins co. Pt said the pharmacist called on 09/21/16 and wanted to speak with Dr [name] and was told that Dr [name] would review. Pt advised [facility] to call pharmacist at [facility] to find out what med can be substituted.  Pt stated" he was not happy." I advised pt I would send the note to Dr [name] and someone would call him back. Pt said" whatever."

## 2016-09-25 ENCOUNTER — Encounter: Payer: Self-pay | Admitting: Family Medicine

## 2016-09-25 ENCOUNTER — Encounter: Payer: Self-pay | Admitting: Internal Medicine

## 2016-09-25 ENCOUNTER — Telehealth: Payer: Self-pay | Admitting: Family Medicine

## 2016-09-25 MED ORDER — DOXYCYCLINE HYCLATE 100 MG PO TABS: 100.0000 mg | ORAL_TABLET | Freq: Two times a day (BID) | ORAL | Status: DC

## 2016-09-25 NOTE — Telephone Encounter (Signed)
See mychart message. Please call the pharmacy and see what they suggest.  It is possible to substitute doxy for tetracycline but this isn't preferred and that was why I was trying to get the original rx approved.   To be clear, there isn't a reason to prefer doxy over tetracycline.  Tetracycline shouldn't be this expensive, given the age of the drug.

## 2016-09-25 NOTE — Telephone Encounter (Signed)
Jody at [employer] did some research and says he found a pretty cool document that he is faxing and says we might want to hold on to it for future reference. Placed in Dr. Josefine Class box.

## 2016-09-26 MED ORDER — DOXYCYCLINE HYCLATE 100 MG PO TABS: 100.0000 mg | ORAL_TABLET | Freq: Two times a day (BID) | ORAL | 0 refills | Status: DC

## 2016-09-26 NOTE — Telephone Encounter (Signed)
There are multiple issues here that need to be documented for the chart. #1. I read the review article that the pharmacy was kind enough to send over. Unfortunately this added no new information since I had already pulled several review articles prior to making a decision, and had considered all of the information that was sent in the review article. #2. I typically don't keep printed review articles in cases such as this as they tend to go out of date very quickly and electronics sources tend to be more reliable and up-to-date. #3. The initial prescription for omeprazole, Pepto-Bismol, tetracycline and Flagyl still would be the likely best option for the patient. This balances out cure rates, tolerability, and avoids potential hazard given the patient's medicine allergies. #4. If the patient is going to need a prior authorization, then we do attempt this. We did the PA.  It is unfortunate that the price of a previously widely available and inexpensive antibiotic has been artificially inflated to the point of being prohibitively expensive.  I realized that is not the pharmacy's fault.  #5. Given that, the substitution with doxycycline is the next best option. I do not make decisions the patients based on insurance coverage. I make decisions based on likelihood of effectiveness and tolerability and safety.  #6. I would've had this conversation yesterday with the pharmacist but Lugene and I waited on hold for interminable period of time until we eventually had to give up and go back to seeing patients here in clinic.  #7. Rx for doxy sent, to substitute for tetracycline. #8. Would take omeprazole, Pepto-Bismol, doxycycline, Flagyl concurrently. Update me if not better. We can send him to GI if not resolved. Thanks.

## 2016-09-26 NOTE — Telephone Encounter (Signed)
Patient notified as instructed by telephone and verbalized understanding. Patient stated that he will call back if he does not get better.

## 2016-10-04 ENCOUNTER — Other Ambulatory Visit: Payer: Self-pay

## 2016-10-04 MED ORDER — ACEBUTOLOL HCL 200 MG PO CAPS: 200.0000 mg | ORAL_CAPSULE | Freq: Two times a day (BID) | ORAL | 3 refills | Status: DC

## 2016-10-04 NOTE — Progress Notes (Unsigned)
Bystolic discontinued d/t Pt request.  Pt changed to acebutolol-order entered.

## 2016-10-17 DIAGNOSIS — M545 Low back pain: Secondary | ICD-10-CM | POA: Diagnosis not present

## 2016-10-17 DIAGNOSIS — M5442 Lumbago with sciatica, left side: Secondary | ICD-10-CM | POA: Diagnosis not present

## 2016-10-17 DIAGNOSIS — M25562 Pain in left knee: Secondary | ICD-10-CM | POA: Diagnosis not present

## 2016-10-23 ENCOUNTER — Encounter: Payer: Self-pay | Admitting: Pulmonary Disease

## 2016-10-23 ENCOUNTER — Ambulatory Visit (INDEPENDENT_AMBULATORY_CARE_PROVIDER_SITE_OTHER): Payer: Medicare Other | Admitting: Pulmonary Disease

## 2016-10-23 VITALS — BP 140/70 | HR 60 | Ht 69.5 in | Wt 287.0 lb

## 2016-10-23 DIAGNOSIS — G4733 Obstructive sleep apnea (adult) (pediatric): Secondary | ICD-10-CM

## 2016-10-23 DIAGNOSIS — L4050 Arthropathic psoriasis, unspecified: Secondary | ICD-10-CM | POA: Insufficient documentation

## 2016-10-23 DIAGNOSIS — R0600 Dyspnea, unspecified: Secondary | ICD-10-CM

## 2016-10-23 DIAGNOSIS — J984 Other disorders of lung: Secondary | ICD-10-CM | POA: Diagnosis not present

## 2016-10-23 DIAGNOSIS — L409 Psoriasis, unspecified: Secondary | ICD-10-CM | POA: Insufficient documentation

## 2016-10-23 NOTE — Patient Instructions (Signed)
   Call and let me or Dr. Elsworth Soho know if your symptoms come back.  Keep your appointment as scheduled with Dr. Elsworth Soho.
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
## 2016-11-07 DIAGNOSIS — R310 Gross hematuria: Secondary | ICD-10-CM | POA: Diagnosis not present

## 2016-11-07 DIAGNOSIS — N200 Calculus of kidney: Secondary | ICD-10-CM | POA: Diagnosis not present

## 2016-11-07 DIAGNOSIS — R1111 Vomiting without nausea: Secondary | ICD-10-CM | POA: Diagnosis not present

## 2016-11-08 ENCOUNTER — Telehealth: Payer: Self-pay | Admitting: Internal Medicine

## 2016-11-08 NOTE — Telephone Encounter (Signed)
Patient with diagnosis of Afib on Eliquis for anticoagulation.    Procedure: Cysto Ureteroscopy with possible stent Date of procedure: pending  CHADS2 score of 3 (CHF, HTN, DM2); CHADSVASC 5; CrCl 188mL/min  Per office protocol, patient can hold Eliquis for 2 days prior to procedure.    Clearance faxed via epic.

## 2016-11-08 NOTE — Telephone Encounter (Signed)
New message      [facility] Pre-operative Risk Assessment    Request for surgical clearance:  1. What type of surgery is being performed? Kidney stone   2. When is this surgery scheduled? Pending   3. Are there any medications that need to be held prior to surgery and how long?ELIQUIS 5 MG TABS tablet  4. Name of physician performing surgery?Dr. Gloriann Loan   5. What is your office phone and fax number?  [place] [address]. fax [phone]   Marcine Matar 11/08/2016, 1:11 PM  _________________________________________________________________   (provider comments below)

## 2016-11-10 DIAGNOSIS — M654 Radial styloid tenosynovitis [de Quervain]: Secondary | ICD-10-CM | POA: Diagnosis not present

## 2016-11-12 ENCOUNTER — Other Ambulatory Visit: Payer: Self-pay | Admitting: Urology

## 2016-11-13 ENCOUNTER — Encounter (HOSPITAL_BASED_OUTPATIENT_CLINIC_OR_DEPARTMENT_OTHER): Payer: Self-pay | Admitting: *Deleted

## 2016-11-13 NOTE — Progress Notes (Addendum)
To [facility] [place] on arrival-Ekg,medication clearance,echo with chart-reviewed with Dr Sabra Heck MDA-ok to proceed at surgery center.Npo after Mn-will take sectral,zyrtec with small amt water in am.

## 2016-11-15 ENCOUNTER — Other Ambulatory Visit: Payer: Self-pay | Admitting: Urology

## 2016-11-16 ENCOUNTER — Encounter: Payer: Self-pay | Admitting: *Deleted

## 2016-11-16 ENCOUNTER — Other Ambulatory Visit: Payer: Self-pay | Admitting: Urology

## 2016-11-16 NOTE — H&P (Signed)
CC/HPI: 02/18: Again, in April 2016 he underwent a lithotripsy for a large stone in his ureteropelvic junction. Stone composition was mixed uric acid and calcium oxalate. He subsequently had a fragment that obstructed him, and he required a secondary procedure with a ureteroscopy. He has undergone additional metabolic assessment. Urinary volumes were reasonable. Urine calcium levels were low. He did have high oxalate levels and very low urinary citrate levels. He has been put on potassium citrate as well as some Citracal. Cost has been a concern for him for the potassium citrate.  He presented recently to see Fritz Pickerel with gross hematuria. CT scan revealed several nonobstructing stones in the lower pole of his right kidney, the largest being 8 mm. There did not appear to be any hydronephrosis. No left-sided stones.  He never had much the way of any flank pain but did have some hematuria. That is now resolved. Urine today shows low levels of microhematuria. Urine pH is 6.5.  Renal ultrasound was done today to rule out severe hydronephrosis. There is no obvious hydro-with question of slight dilation of the renal pelvis.      August 2018: Six-month follow-up office visit. Patient denies any passage of interval stone material but did have gross hematuria a few weeks ago. Denies dysuria. He continues to have intermittent right-sided flank pain. Pain free today. Continues on tamsulosin and potassium citrate. Renal ultrasound today does have some questionable fullness of the right renal pelvis with an obvious stone burden. He underwent CT scan that revealed a 11 mm UPJ calculus and a 3 mm ureteral calculus.he is here for ureteroscopy.  ALLERGIES: acyclovir amlodipine Oxycodone Verapamil Zithromax TABS   MEDICATIONS: Lisinopril 2.5 mg tablet  Zyrtec  Acebutolol Hcl  Citracal-Vitamin D 200 mg calcium-250 unit tablet Oral  Eliquis 5 mg tablet Oral  Ibuprofen 200 MG Oral Tablet Oral  Lasix 20 mg tablet Oral   Otezla 30 mg tablet 0 Oral  Pravastatin Sodium  PreserVision AREDS CAPS Oral  PROzac 20 MG Oral Capsule Oral  Ropinirole Hcl 3 mg tablet Oral  Vitamin D3 2,000 unit tablet Oral    GU PSH: Cysto Uretero Lithotripsy - July 12, 2013 Cystoscopy Insert Stent - 07-13-14, 13-Jul-2014, 07/12/2013 ESWL - 07/13/14 Ureteroscopic stone removal - 11/10/2014     PSH Notes: Cystoscopy With Ureteroscopy With Removal Of Calculus, Cystoscopy With Insertion Of Ureteral Stent Right, Lithotripsy, Cystoscopy With Insertion Of Ureteral Stent Right, Cystoscopy With Insertion Of Ureteral Stent Right, Cystoscopy With Ureteroscopy With Lithotripsy, Eye Surgery, Skull Excision, Cardiac cath   NON-GU PSH: Total Knee Replacement, 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, Right - 08/31/2015   GU PMH: Renal calculus, Nephrolithiasis - 07/13/2015 Gross hematuria, Gross hematuria - 2014-07-13 Other microscopic hematuria, Microscopic hematuria - 07-13-14 BPH w/o LUTS, Benign hypertrophy of prostate - 12-Jul-2013     PMH Notes:  2013-01-13 09:07:07 - Note: Arthritis   NON-GU PMH: Encounter for general adult medical examination without abnormal findings, Encounter for preventive health examination - 07-13-15 Hyperuricemia, Hyperuricemia - 02/09/2015 Anxiety, Anxiety (Symptom) - 07-12-2012 Arrhythmia, Rhythm Disorder - 07-12-2012 Gout, Gout - 07/12/2012 Personal history of other diseases of the circulatory system, History of cardiac disorder - 2012/07/12 Personal history of other diseases of the nervous system and sense organs, History of sleep apnea - 2012-07-12 Personal history of other mental and behavioral disorders, History of depression - 2012-07-12 Personal history of other specified conditions, History of heartburn - 12-Jul-2012 Unspecified atrial fibrillation, Atrial Fibrillation - Jul 12, 2012   FAMILY HISTORY: Death In The Family Father - Father  Death In The Family Mother - Mother Family Health Status Number - Runs In Family Heart Disease - Father, Brother   SOCIAL HISTORY: Marital Status: Married Preferred Language: English; Race:  White Current Smoking Status: Patient does not smoke anymore. Has not smoked since 11/27/1981.  Social Drinker.  Drinks 1 caffeinated drink per day. Patient's occupation is/was Retired.   REVIEW OF SYSTEMS:    GU Review Male:   Patient reports frequent urination, hard to postpone urination, get up at night to urinate, leakage of urine, stream starts and stops, and erection problems. Patient denies burning/ pain with urination, trouble starting your stream, have to strain to urinate , and penile pain.  Gastrointestinal (Upper):   Patient denies nausea, vomiting, and indigestion/ heartburn.  Gastrointestinal (Lower):   Patient denies diarrhea and constipation.  Constitutional:   Patient denies fever, night sweats, weight loss, and fatigue.  Skin:   Patient denies skin rash/ lesion and itching.  Eyes:   Patient denies blurred vision and double vision.  Ears/ Nose/ Throat:   Patient reports sinus problems. Patient denies sore throat.  Hematologic/Lymphatic:   Patient denies swollen glands and easy bruising.  Cardiovascular:   Patient denies leg swelling and chest pains.  Respiratory:   Patient denies cough and shortness of breath.  Endocrine:   Patient reports excessive thirst.   Musculoskeletal:   Patient denies back pain and joint pain.  Neurological:   Patient denies headaches and dizziness.  Psychologic:   Patient denies depression and anxiety.   VITAL SIGNS:      11/07/2016 01:15 PM  Weight 279.8 lb / 126.92 kg  Height 69.5 in / 176.53 cm  BP 113/76 mmHg  Pulse 80 /min  Temperature 97.6 F / 36.4 C  BMI 40.7 kg/m   MULTI-SYSTEM PHYSICAL EXAMINATION:    Constitutional: Well-nourished. No physical deformities. Normally developed. Good grooming.   Neck: Neck symmetrical, not swollen. Normal tracheal position.  Respiratory: No labored breathing, no use of accessory muscles. CTA.  Cardiovascular: Normal temperature, adequate perfusion  Skin: No paleness, no jaundice, no cyanosis. No  lesion, no ulcer, no rash.  Neurologic / Psychiatric: Oriented to time, oriented to place, oriented to person. No depression, no anxiety, no agitation.  Gastrointestinal: Significant truncal obesity. No mass, no tenderness, no rigidity. No palpable flank tenderness.  Musculoskeletal: Spine, ribs, pelvis no bilateral tenderness. Normal gait and station of head and neck.         Urinalysis w/Scope Dipstick Dipstick Cont'd Micro  Color: Yellow Bilirubin: Neg WBC/hpf: NS (Not Seen)  Appearance: Clear Ketones: Neg RBC/hpf: 3 - 10/hpf  Specific Gravity: 1.020 Blood: 3+ Bacteria: NS (Not Seen)  pH: 5.5 Protein: Neg Cystals: NS (Not Seen)  Glucose: 3+ Urobilinogen: 0.2 Casts: NS (Not Seen)    Nitrites: Neg Trichomonas: Not Present    Leukocyte Esterase: Neg Mucous: Present      Epithelial Cells: 0 - 5/hpf      Yeast: NS (Not Seen)      Sperm: Not Present   ASSESSMENT:      ICD-10 Details  1 GU:   Gross hematuria - R31.0   2   Renal calculus - N20.0    PLAN:     Large renal calculi noted in the right renal collecting system and UPJ resulting in his intermittent symptoms. The reading radiologist also noted a faint opacity in the right proximal ureter likely representing a stone. Due to the positioning of his kidney on the right and recurrent stone symptoms I  believe he would best be served by procedural intervention with goal of stone free on the right side. For ureteroscopy: The procedure as well as risks and complications were discussed with the patient. These include but are not limited to infection, injury to the urinary tract i.e. ureteral disruption/evulsion, bleeding, stent discomfort, anesthetic complications, among others. We'll proceed with right ureteroscopy, laser lithotripsy and ureteral stent placement.

## 2016-11-16 NOTE — Discharge Instructions (Signed)
You had a ureteroscopy with laser lithotripsy and ureteral stent placement.  You may notice some blood in the urine.  This is okay as long as it is not severe and youre able to urinate (or if your catheter is draining if you have a Foley catheter).  Some people also experienced discomfort and a great sense of urinary urgency with a stent in place.  To help with this, make sure to take Flomax and oxybutynin as directed. You may also take pain medication as needed for discomfort from the stent. Call or go to the emergency department should you experience severe pain or fever greater than 101.

## 2016-11-19 ENCOUNTER — Ambulatory Visit (HOSPITAL_BASED_OUTPATIENT_CLINIC_OR_DEPARTMENT_OTHER): Payer: Medicare Other | Admitting: Anesthesiology

## 2016-11-19 ENCOUNTER — Encounter (HOSPITAL_BASED_OUTPATIENT_CLINIC_OR_DEPARTMENT_OTHER): Payer: Self-pay

## 2016-11-19 ENCOUNTER — Other Ambulatory Visit: Payer: Self-pay | Admitting: *Deleted

## 2016-11-19 ENCOUNTER — Encounter: Payer: Self-pay | Admitting: Family Medicine

## 2016-11-19 ENCOUNTER — Ambulatory Visit (HOSPITAL_BASED_OUTPATIENT_CLINIC_OR_DEPARTMENT_OTHER)
Admission: RE | Admit: 2016-11-19 | Discharge: 2016-11-19 | Disposition: A | Discharge status: Home / Self Care | Payer: Medicare Other | Source: Ambulatory Visit | Attending: Urology | Admitting: Urology

## 2016-11-19 ENCOUNTER — Encounter (HOSPITAL_BASED_OUTPATIENT_CLINIC_OR_DEPARTMENT_OTHER)
Admission: RE | Disposition: A | Discharge status: Home / Self Care | Payer: Self-pay | Source: Ambulatory Visit | Attending: Urology

## 2016-11-19 ENCOUNTER — Ambulatory Visit (INDEPENDENT_AMBULATORY_CARE_PROVIDER_SITE_OTHER): Payer: Medicare Other | Admitting: Family Medicine

## 2016-11-19 VITALS — BP 144/84 | HR 61 | Temp 97.4°F | Wt 280.8 lb

## 2016-11-19 DIAGNOSIS — Z87891 Personal history of nicotine dependence: Secondary | ICD-10-CM | POA: Insufficient documentation

## 2016-11-19 DIAGNOSIS — N200 Calculus of kidney: Secondary | ICD-10-CM | POA: Diagnosis not present

## 2016-11-19 DIAGNOSIS — M109 Gout, unspecified: Secondary | ICD-10-CM | POA: Diagnosis not present

## 2016-11-19 DIAGNOSIS — Z79899 Other long term (current) drug therapy: Secondary | ICD-10-CM | POA: Diagnosis not present

## 2016-11-19 DIAGNOSIS — Z8679 Personal history of other diseases of the circulatory system: Secondary | ICD-10-CM | POA: Diagnosis not present

## 2016-11-19 DIAGNOSIS — E1165 Type 2 diabetes mellitus with hyperglycemia: Secondary | ICD-10-CM

## 2016-11-19 DIAGNOSIS — I4891 Unspecified atrial fibrillation: Secondary | ICD-10-CM | POA: Diagnosis not present

## 2016-11-19 DIAGNOSIS — Z885 Allergy status to narcotic agent status: Secondary | ICD-10-CM | POA: Insufficient documentation

## 2016-11-19 DIAGNOSIS — Z7901 Long term (current) use of anticoagulants: Secondary | ICD-10-CM | POA: Diagnosis not present

## 2016-11-19 DIAGNOSIS — Z881 Allergy status to other antibiotic agents status: Secondary | ICD-10-CM | POA: Diagnosis not present

## 2016-11-19 DIAGNOSIS — Z888 Allergy status to other drugs, medicaments and biological substances status: Secondary | ICD-10-CM | POA: Diagnosis not present

## 2016-11-19 DIAGNOSIS — F419 Anxiety disorder, unspecified: Secondary | ICD-10-CM | POA: Insufficient documentation

## 2016-11-19 DIAGNOSIS — E119 Type 2 diabetes mellitus without complications: Secondary | ICD-10-CM

## 2016-11-19 DIAGNOSIS — IMO0001 Reserved for inherently not codable concepts without codable children: Secondary | ICD-10-CM

## 2016-11-19 DIAGNOSIS — Z539 Procedure and treatment not carried out, unspecified reason: Secondary | ICD-10-CM | POA: Diagnosis not present

## 2016-11-19 LAB — POCT I-STAT 4, (NA,K, GLUC, HGB,HCT)
GLUCOSE: 471 mg/dL — AB (ref 65–99)
HEMATOCRIT: 36 % — AB (ref 39.0–52.0)
Hemoglobin: 12.2 g/dL — ABNORMAL LOW (ref 13.0–17.0)
Potassium: 4.2 mmol/L (ref 3.5–5.1)
Sodium: 138 mmol/L (ref 135–145)

## 2016-11-19 LAB — COMPREHENSIVE METABOLIC PANEL
ALT: 14 U/L (ref 0–53)
AST: 14 U/L (ref 0–37)
Albumin: 3.7 g/dL (ref 3.5–5.2)
Alkaline Phosphatase: 98 U/L (ref 39–117)
BUN: 19 mg/dL (ref 6–23)
CALCIUM: 9.4 mg/dL (ref 8.4–10.5)
CHLORIDE: 100 meq/L (ref 96–112)
CO2: 26 meq/L (ref 19–32)
CREATININE: 0.98 mg/dL (ref 0.40–1.50)
GFR: 80.29 mL/min (ref >60.00)
Glucose, Bld: 370 mg/dL — ABNORMAL HIGH (ref 70–99)
POTASSIUM: 4.2 meq/L (ref 3.5–5.1)
SODIUM: 134 meq/L — AB (ref 135–145)
Total Bilirubin: 1.3 mg/dL — ABNORMAL HIGH (ref 0.2–1.2)
Total Protein: 7.6 g/dL (ref 6.0–8.3)

## 2016-11-19 LAB — HEMOGLOBIN A1C: Hgb A1c MFr Bld: 10.5 % — ABNORMAL HIGH (ref 4.6–6.5)

## 2016-11-19 LAB — GLUCOSE, CAPILLARY: Glucose-Capillary: 482 mg/dL — ABNORMAL HIGH (ref 65–99)

## 2016-11-19 SURGERY — CYSTOURETEROSCOPY, WITH RETROGRADE PYELOGRAM AND STENT INSERTION
Anesthesia: General | Laterality: Right

## 2016-11-19 MED ORDER — GLUCOSE BLOOD VI STRP: ORAL_STRIP | 3 refills | Status: DC

## 2016-11-19 MED ORDER — ACCU-CHEK GUIDE W/DEVICE KIT: 1.0000 | PACK | Freq: Every day | 0 refills | Status: DC

## 2016-11-19 MED ORDER — ACCU-CHEK SOFTCLIX LANCETS MISC: 3 refills | Status: DC

## 2016-11-19 MED ORDER — CEFAZOLIN SODIUM-DEXTROSE 2-4 GM/100ML-% IV SOLN
2.0000 g | INTRAVENOUS | Status: DC
  Filled 2016-11-19: qty 100

## 2016-11-19 MED ORDER — ONDANSETRON HCL 4 MG/2ML IJ SOLN
INTRAMUSCULAR | Status: AC
  Filled 2016-11-19: qty 2

## 2016-11-19 MED ORDER — SODIUM CHLORIDE 0.9 % IR SOLN
Status: DC | PRN
  Administered 2016-11-19: 4000 mL

## 2016-11-19 MED ORDER — ACCU-CHEK GUIDE W/DEVICE KIT: 1.0000 | PACK | Freq: Every day | 0 refills | Status: AC

## 2016-11-19 MED ORDER — CEFAZOLIN SODIUM-DEXTROSE 1-4 GM/50ML-% IV SOLN
INTRAVENOUS | Status: AC
  Filled 2016-11-19: qty 50

## 2016-11-19 MED ORDER — FENTANYL CITRATE (PF) 100 MCG/2ML IJ SOLN
INTRAMUSCULAR | Status: AC
  Filled 2016-11-19: qty 2

## 2016-11-19 MED ORDER — ACCU-CHEK GUIDE CONTROL VI LIQD: 1.0000 | 0 refills | Status: DC

## 2016-11-19 MED ORDER — CEFAZOLIN SODIUM-DEXTROSE 2-4 GM/100ML-% IV SOLN
INTRAVENOUS | Status: AC
  Filled 2016-11-19: qty 100

## 2016-11-19 MED ORDER — MIDAZOLAM HCL 2 MG/2ML IJ SOLN
INTRAMUSCULAR | Status: AC
  Filled 2016-11-19: qty 2

## 2016-11-19 MED ORDER — LACTATED RINGERS IV SOLN
INTRAVENOUS | Status: DC
  Administered 2016-11-19: 07:00:00 via INTRAVENOUS
  Filled 2016-11-19 (×2): qty 1000

## 2016-11-19 MED ORDER — DEXTROSE 5 % IV SOLN
3.0000 g | INTRAVENOUS | Status: DC
  Filled 2016-11-19: qty 3000

## 2016-11-19 MED ORDER — ACCU-CHEK FASTCLIX LANCETS MISC: 3 refills | Status: DC

## 2016-11-19 MED ORDER — GLIPIZIDE 5 MG PO TABS: 5.0000 mg | ORAL_TABLET | Freq: Every day | ORAL | 3 refills | Status: DC

## 2016-11-19 MED ORDER — ACCU-CHEK GUIDE CONTROL VI LIQD: 1.0000 | Freq: Every day | 0 refills | Status: DC

## 2016-11-19 MED ORDER — ARTIFICIAL TEARS OPHTHALMIC OINT
TOPICAL_OINTMENT | OPHTHALMIC | Status: AC
  Filled 2016-11-19: qty 3.5

## 2016-11-19 MED ORDER — LIDOCAINE 2% (20 MG/ML) 5 ML SYRINGE
INTRAMUSCULAR | Status: AC
  Filled 2016-11-19: qty 5

## 2016-11-19 MED ORDER — DEXAMETHASONE SODIUM PHOSPHATE 10 MG/ML IJ SOLN
INTRAMUSCULAR | Status: AC
  Filled 2016-11-19: qty 1

## 2016-11-19 MED ORDER — IOHEXOL 300 MG/ML  SOLN
INTRAMUSCULAR | Status: DC | PRN
  Administered 2016-11-19: 17 mL

## 2016-11-19 MED ORDER — METFORMIN HCL 500 MG PO TABS: 500.0000 mg | ORAL_TABLET | Freq: Two times a day (BID) | ORAL | 3 refills | Status: DC

## 2016-11-19 MED ORDER — PROPOFOL 10 MG/ML IV BOLUS
INTRAVENOUS | Status: AC
  Filled 2016-11-19: qty 40

## 2016-11-19 SURGICAL SUPPLY — 21 items
BAG DRAIN URO-CYSTO SKYTR STRL (DRAIN) IMPLANT
BASKET LASER NITINOL 1.9FR (BASKET) IMPLANT
CATH INTERMIT  6FR 70CM (CATHETERS) IMPLANT
CLOTH BEACON ORANGE TIMEOUT ST (SAFETY) IMPLANT
FIBER LASER FLEXIVA 365 (UROLOGICAL SUPPLIES) IMPLANT
FIBER LASER TRAC TIP (UROLOGICAL SUPPLIES) IMPLANT
GOWN STRL REUS W/TWL LRG LVL3 (GOWN DISPOSABLE) IMPLANT
GOWN STRL REUS W/TWL XL LVL3 (GOWN DISPOSABLE) IMPLANT
GUIDEWIRE ANG ZIPWIRE 038X150 (WIRE) IMPLANT
GUIDEWIRE STR DUAL SENSOR (WIRE) IMPLANT
INFUSOR MANOMETER BAG 3000ML (MISCELLANEOUS) IMPLANT
IV NS 1000ML (IV SOLUTION)
IV NS 1000ML BAXH (IV SOLUTION) IMPLANT
IV NS IRRIG 3000ML ARTHROMATIC (IV SOLUTION) IMPLANT
KIT RM TURNOVER CYSTO AR (KITS) IMPLANT
MANIFOLD NEPTUNE II (INSTRUMENTS) IMPLANT
NS IRRIG 500ML POUR BTL (IV SOLUTION) IMPLANT
PACK CYSTO (CUSTOM PROCEDURE TRAY) IMPLANT
SYRINGE 10CC LL (SYRINGE) IMPLANT
TUBE CONNECTING 12X1/4 (SUCTIONS) IMPLANT
TUBE FEEDING 8FR 16IN STR KANG (MISCELLANEOUS) IMPLANT

## 2016-11-19 NOTE — Interval H&P Note (Signed)
History and Physical Interval Note:  11/19/2016 7:06 AM  [name]  has presented today for surgery, with the diagnosis of RIGHT RENAL STONE  The various methods of treatment have been discussed with the patient and family. After consideration of risks, benefits and other options for treatment, the patient has consented to  Procedure(s): CYSTOSCOPY WITH RIGHT  RETROGRADE PYELOGRAM, RIGHT URETEROSCOPY HOLMIUM LASER  BASKET EXTRACTION AND STENT PLACEMENT (Right) HOLMIUM LASER APPLICATION (Right) as a surgical intervention .  The patient's history has been reviewed, patient examined, no change in status, stable for surgery.  I have reviewed the patient's chart and labs.  Questions were answered to the patient's satisfaction.     Marton Redwood, III

## 2016-11-19 NOTE — Progress Notes (Signed)
The patient had history of borderline diabetes noted last year with an A1c of 6.6. The plan was for him to work on his weight after his knee pain was improved and we can recheck his labs later on. In the meantime he's had multiple illnesses. All of this prevented routine follow-up, due to multiple acute illnesses. In the meantime he had not been restricting his carbohydrates, he been eating some sweets and thighs recently. He went in for his urologic procedure this morning and his blood sugar was >400.  This was the first reading that high. Had been thirsty recently, polyuria, in the last few weeks.  Urology procedure cancelled this AM.  D/w pt.    No pain currently.    PMH and SH reviewed  ROS: Per HPI unless specifically indicated in ROS section   Meds, vitals, and allergies reviewed.   GEN: nad, alert and oriented HEENT: mucous membranes moist NECK: supple w/o LA CV: rrr.  PULM: ctab, no inc wob ABD: soft, +bs EXT: no edema SKIN: no acute rash

## 2016-11-19 NOTE — Anesthesia Preprocedure Evaluation (Addendum)
Anesthesia Evaluation  Patient identified by MRN, date of birth, ID band Patient awake    Reviewed: Allergy & Precautions, NPO status , Patient's Chart, lab work & pertinent test results  History of Anesthesia Complications (+) PONV  Airway Mallampati: II  TM Distance: >3 FB     Dental   Pulmonary shortness of breath, sleep apnea , former smoker,    breath sounds clear to auscultation       Cardiovascular hypertension, +CHF  + dysrhythmias Atrial Fibrillation      Neuro/Psych    GI/Hepatic Neg liver ROS, GERD  ,  Endo/Other  diabetes  Renal/GU Renal disease     Musculoskeletal   Abdominal   Peds  Hematology   Anesthesia Other Findings   Reproductive/Obstetrics                             Anesthesia Physical Anesthesia Plan  ASA: III  Anesthesia Plan: General   Post-op Pain Management:    Induction: Intravenous  PONV Risk Score and Plan: 3 and Ondansetron, Dexamethasone, Midazolam, Propofol infusion and Treatment may vary due to age or medical condition  Airway Management Planned: LMA  Additional Equipment:   Intra-op Plan:   Post-operative Plan: Extubation in OR  Informed Consent: I have reviewed the patients History and Physical, chart, labs and discussed the procedure including the risks, benefits and alternatives for the proposed anesthesia with the patient or authorized representative who has indicated his/her understanding and acceptance.   Dental advisory given  Plan Discussed with: CRNA and Anesthesiologist  Anesthesia Plan Comments:         Anesthesia Quick Evaluation

## 2016-11-19 NOTE — Patient Instructions (Addendum)
Go to the lab on the way out.  We'll contact you with your lab report. Start metformin once a day.  Increase to twice a day in about 1 week if tolerated.  If GI upset, then cut back on dose.  Start taking glizipide 5mg  in the AM right before breakfast.  Update me in about 2 days, sooner if needed.  Drink enough water to keep your urine clear but not enough to make your ankles puffy.  Take care.  Glad to see you.

## 2016-11-20 NOTE — Assessment & Plan Note (Signed)
D/w pt about DM2 path/phys.  He doesn't appear to need hospitalization.  Inc fluids enough to keep urine clear but not enough to make his ankles puffy.  Goal to avoid fluid overload given his CHF hx.  Check labs today, cut carbs from diet, handout given and explained.  Start metformin, Cr okay.  Then add on glipizide.  Okay to inc metformin to BID in about 1 week.  We'll go from there.  He agrees, see AVS. >25 minutes spent in face to face time with patient, >50% spent in counselling or coordination of care.

## 2016-11-26 ENCOUNTER — Encounter: Payer: Self-pay | Admitting: Family Medicine

## 2016-12-07 ENCOUNTER — Other Ambulatory Visit: Payer: Self-pay | Admitting: *Deleted

## 2016-12-07 DIAGNOSIS — I498 Other specified cardiac arrhythmias: Secondary | ICD-10-CM

## 2016-12-07 MED ORDER — ACEBUTOLOL HCL 200 MG PO CAPS: 200.0000 mg | ORAL_CAPSULE | Freq: Two times a day (BID) | ORAL | 8 refills | Status: DC

## 2016-12-10 ENCOUNTER — Encounter: Payer: Self-pay | Admitting: Internal Medicine

## 2016-12-10 ENCOUNTER — Ambulatory Visit (INDEPENDENT_AMBULATORY_CARE_PROVIDER_SITE_OTHER): Payer: Medicare Other | Admitting: Internal Medicine

## 2016-12-10 VITALS — BP 126/80 | HR 62 | Ht 69.5 in | Wt 282.4 lb

## 2016-12-10 DIAGNOSIS — I5032 Chronic diastolic (congestive) heart failure: Secondary | ICD-10-CM

## 2016-12-10 DIAGNOSIS — I10 Essential (primary) hypertension: Secondary | ICD-10-CM

## 2016-12-10 DIAGNOSIS — I480 Paroxysmal atrial fibrillation: Secondary | ICD-10-CM

## 2016-12-10 NOTE — Patient Instructions (Signed)
Medication Instructions:  Your physician recommends that you continue on your current medications as directed. Please refer to the Current Medication list given to you today.  Labwork: None ordered.  Testing/Procedures: None ordered.  Follow-Up: Your physician recommends that you schedule a follow-up appointment in mid November 2018 with Dr. Lovena Le.  Any Other Special Instructions Will Be Listed Below (If Applicable).     If you need a refill on your cardiac medications before your next appointment, please call your pharmacy.
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
## 2016-12-12 ENCOUNTER — Telehealth: Payer: Self-pay | Admitting: Pulmonary Disease

## 2016-12-12 DIAGNOSIS — G4733 Obstructive sleep apnea (adult) (pediatric): Secondary | ICD-10-CM

## 2016-12-12 NOTE — Telephone Encounter (Signed)
Spoke with patient. Advised him that I will place the order for him to switch DME companies. He verbalized understanding. Nothing else needed at time of call.

## 2016-12-12 NOTE — Telephone Encounter (Signed)
Spoke with patient. He stated that he has been having a hard time getting supplies and support from [facility]. Stated that he is using a nasal mask that was given to him back in February 2018 and they will not send anymore supplies.   He wants to switch to a new DME. Advised patient that involves placing an order, in which I will have to ask RA permission first. He verbalized understanding.   RA, is it ok for me to place an order for him to switch DME companies? Please advise. Thanks!

## 2016-12-12 NOTE — Telephone Encounter (Signed)
OK for new DME

## 2016-12-17 ENCOUNTER — Other Ambulatory Visit: Payer: Self-pay | Admitting: Family Medicine

## 2016-12-21 ENCOUNTER — Telehealth: Payer: Self-pay | Admitting: Pulmonary Disease

## 2016-12-21 NOTE — Telephone Encounter (Signed)
lmtcb for [name] at [facility].  

## 2016-12-21 NOTE — Telephone Encounter (Signed)
You can pull it from the notes  Tab -05/2013   Bilevel titration study 05/2013: 11/7cm, +++limb movements with 7/hr with arousal or awakening

## 2016-12-21 NOTE — Telephone Encounter (Signed)
Called and spoke with [facility] and she stated that the pt is going to be new for them and he is needing supplies---his sleep study has the raw data in epic and this is needing the interpretation of the sleep study scanned into the pts chart.  RA please advise. Thanks

## 2016-12-24 NOTE — Telephone Encounter (Signed)
Called and spoke to [place]. Informed her of the recs per RA. Melissa states she has already located the data. Nothing further needed.

## 2017-01-02 ENCOUNTER — Encounter: Payer: Self-pay | Admitting: Family Medicine

## 2017-01-10 ENCOUNTER — Telehealth: Payer: Self-pay

## 2017-01-10 NOTE — Telephone Encounter (Signed)
Received a fax from [place] for [name]. Patients enrollment expires on 04/02/27. A new application must be submitted in order to continue through 2019. Patient is required to complete section A and attach proof of annual income. The provider must complete and sign section B.   Called patient to update. He plans to bring his portion in once received and completed. We will fax once all documents have been collected. Patient voices understanding and denies any questions at this time.  Casondra Gasca, [place], CPhT 3:30 PM

## 2017-01-31 ENCOUNTER — Encounter: Payer: Self-pay | Admitting: Family Medicine

## 2017-01-31 ENCOUNTER — Ambulatory Visit (INDEPENDENT_AMBULATORY_CARE_PROVIDER_SITE_OTHER): Payer: Medicare Other | Admitting: Family Medicine

## 2017-01-31 ENCOUNTER — Telehealth: Payer: Self-pay | Admitting: Family Medicine

## 2017-01-31 VITALS — BP 122/80 | HR 68 | Temp 98.1°F | Wt 286.0 lb

## 2017-01-31 DIAGNOSIS — Z23 Encounter for immunization: Secondary | ICD-10-CM

## 2017-01-31 DIAGNOSIS — E119 Type 2 diabetes mellitus without complications: Secondary | ICD-10-CM

## 2017-01-31 MED ORDER — METFORMIN HCL ER 750 MG PO TB24: 750.0000 mg | ORAL_TABLET | Freq: Every day | ORAL | 3 refills | Status: DC

## 2017-01-31 NOTE — Telephone Encounter (Signed)
Copied from [place] #[id number]. Topic: Quick Communication - See Telephone Encounter >> Jan 31, 2017 12:45 PM Corie Chiquito, [place] wrote: CRM for notification. See Telephone encounter for:  01/31/17. [facility] called because they need surgical clearance for the patient. They will be faxing over another form

## 2017-01-31 NOTE — Patient Instructions (Signed)
See if you can get the metformin XR covered.   If so, stop the regular and try 1 a day (750mg ).  It tolerated, then go up to 2 tabs a day.  If not covered/really expensive/not tolerable, then stop and go back to the tolerable dose of plain metformin.   We may need to make other changes later on.  Thanks for your effort.  Labs later on.   Take care.  Glad to see you.  Update me as needed.

## 2017-01-31 NOTE — Progress Notes (Signed)
Diabetes:  Using medications without difficulties: he is taking 4 metformin a day but having some diarrhea, some days worse than other but present most days.  D/w pt about options, re: cutting back on metformin.  He had similar sx with 2 tabs a day total.   Hypoglycemic episodes:no Hyperglycemic episodes:no Feet problems: no Blood Sugars averaging: ~125 in the AMs now eye exam within last year: due, d/w pt.  He'll call to schedule.  He is working hard to limit carbs.  His diet is sustainable.   D/w pt.    He has f/u with urology pending to make decisions about intervention, at [facility] u[facility].  He isn't having pain now, with some occ mild discomfort "but not pain" and no bleeding.    He has some fatigue, worse since his dx of DM2.  Some days are better than others.  Last HGB was normal, back in 08/2016.    Meds, vitals, and allergies reviewed.   ROS: Per HPI unless specifically indicated in ROS section   GEN: nad, alert and oriented HEENT: mucous membranes moist NECK: supple w/o LA CV: IRR, not tachy PULM: ctab, no inc wob ABD: soft, +bs EXT: no edema SKIN: no acute rash  Diabetic foot exam: Normal inspection No skin breakdown No calluses  Normal DP pulses Normal sensation to light touch and monofilament Nails normal

## 2017-01-31 NOTE — Telephone Encounter (Signed)
I'll work on the hard copy.  Thanks.  

## 2017-02-01 NOTE — Assessment & Plan Note (Addendum)
Blood Sugars averaging: ~125 in the AMs now eye exam within last year: due, d/w pt.  He'll call to schedule.  He is working hard to limit carbs.  His diet is sustainable.   D/w pt.   His sugars much better and he should be okay to proceed with the urology procedure, meaning his current situation diabetes should not prevent the procedure. We did not recheck his A1c as it is not yet and it would likely a falsely elevated at this point. D/w pt.  He agrees.  See AVS.   He has diarrhea from metformin.  He'll see if he can get the metformin XR covered.   If so, stop the regular and try 1 a day (750mg ).  It tolerated, then go up to 2 tabs a day.  If not covered/really expensive/not tolerable, then stop and go back to the tolerable dose of plain metformin.   We may need to make other changes later on.

## 2017-02-04 NOTE — Telephone Encounter (Signed)
Form faxed to [facility] along with 01/31/17 office note as requested.

## 2017-02-05 ENCOUNTER — Other Ambulatory Visit: Payer: Self-pay | Admitting: Urology

## 2017-02-06 ENCOUNTER — Telehealth: Payer: Self-pay

## 2017-02-06 ENCOUNTER — Other Ambulatory Visit: Payer: Self-pay | Admitting: Urology

## 2017-02-06 NOTE — Telephone Encounter (Signed)
Provider portion for [facility] patient assistance has been completed and submitted to foundation via fax. Will update once we receive a response.   Will send document to scan center.  Called patient to check on his application process. Left message.  Crit Obremski, [place], CPhT 10:35 AM

## 2017-02-11 ENCOUNTER — Telehealth: Payer: Self-pay | Admitting: Rheumatology

## 2017-02-11 NOTE — Telephone Encounter (Signed)
Patient states his left wrist has been swollen, tender and inflamed. Patient states this has been going on for approximately a week. Patient is currently on Otezla 30 MG twice daily. Patient states he has not had a flare recently and states this is what his flares have felt like in the past. Patient has been scheduled for 02/12/17 at 3:30 pm.

## 2017-02-11 NOTE — Telephone Encounter (Signed)
Patient called stating that for the last 6 days his hands have been swollen and sore.  He has tried ice with no relief.  He has an appointment on December 5th but would like to get in sooner than that.  CB#[phone].  Thank you.
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
## 2017-02-12 ENCOUNTER — Encounter: Payer: Self-pay | Admitting: Rheumatology

## 2017-02-12 ENCOUNTER — Ambulatory Visit (INDEPENDENT_AMBULATORY_CARE_PROVIDER_SITE_OTHER): Payer: Medicare Other | Admitting: Rheumatology

## 2017-02-12 VITALS — BP 115/58 | HR 51 | Resp 19 | Ht 69.5 in | Wt 287.0 lb

## 2017-02-12 DIAGNOSIS — M170 Bilateral primary osteoarthritis of knee: Secondary | ICD-10-CM | POA: Diagnosis not present

## 2017-02-12 DIAGNOSIS — M19072 Primary osteoarthritis, left ankle and foot: Secondary | ICD-10-CM

## 2017-02-12 DIAGNOSIS — M19041 Primary osteoarthritis, right hand: Secondary | ICD-10-CM

## 2017-02-12 DIAGNOSIS — R600 Localized edema: Secondary | ICD-10-CM | POA: Diagnosis not present

## 2017-02-12 DIAGNOSIS — L409 Psoriasis, unspecified: Secondary | ICD-10-CM | POA: Diagnosis not present

## 2017-02-12 DIAGNOSIS — Z8679 Personal history of other diseases of the circulatory system: Secondary | ICD-10-CM

## 2017-02-12 DIAGNOSIS — Z8639 Personal history of other endocrine, nutritional and metabolic disease: Secondary | ICD-10-CM

## 2017-02-12 DIAGNOSIS — M19071 Primary osteoarthritis, right ankle and foot: Secondary | ICD-10-CM | POA: Diagnosis not present

## 2017-02-12 DIAGNOSIS — I472 Ventricular tachycardia, unspecified: Secondary | ICD-10-CM

## 2017-02-12 DIAGNOSIS — M25532 Pain in left wrist: Secondary | ICD-10-CM | POA: Diagnosis not present

## 2017-02-12 DIAGNOSIS — Z862 Personal history of diseases of the blood and blood-forming organs and certain disorders involving the immune mechanism: Secondary | ICD-10-CM | POA: Diagnosis not present

## 2017-02-12 DIAGNOSIS — Z79899 Other long term (current) drug therapy: Secondary | ICD-10-CM | POA: Diagnosis not present

## 2017-02-12 DIAGNOSIS — G2581 Restless legs syndrome: Secondary | ICD-10-CM

## 2017-02-12 DIAGNOSIS — L4050 Arthropathic psoriasis, unspecified: Secondary | ICD-10-CM

## 2017-02-12 DIAGNOSIS — Z87448 Personal history of other diseases of urinary system: Secondary | ICD-10-CM | POA: Diagnosis not present

## 2017-02-12 DIAGNOSIS — Z8669 Personal history of other diseases of the nervous system and sense organs: Secondary | ICD-10-CM | POA: Diagnosis not present

## 2017-02-12 DIAGNOSIS — M19042 Primary osteoarthritis, left hand: Secondary | ICD-10-CM

## 2017-02-12 MED ORDER — LIDOCAINE HCL (PF) 1 % IJ SOLN
1.0000 mL | INTRAMUSCULAR | Status: AC | PRN
  Administered 2017-02-12: 1 mL

## 2017-02-12 MED ORDER — TRIAMCINOLONE ACETONIDE 40 MG/ML IJ SUSP
30.0000 mg | INTRAMUSCULAR | Status: AC | PRN
  Administered 2017-02-12: 30 mg via INTRA_ARTICULAR

## 2017-02-13 DIAGNOSIS — N200 Calculus of kidney: Secondary | ICD-10-CM | POA: Diagnosis not present

## 2017-02-15 ENCOUNTER — Encounter (HOSPITAL_BASED_OUTPATIENT_CLINIC_OR_DEPARTMENT_OTHER): Payer: Self-pay | Admitting: *Deleted

## 2017-02-15 ENCOUNTER — Other Ambulatory Visit: Payer: Self-pay

## 2017-02-15 NOTE — Progress Notes (Signed)
NPO AFTER MN W/ EXCEPTION CLEAR LIQUIDS UNTIL 0600 (NO CREAM/ MILK PRODUCTS).  ARRIVE AT 1000.  NEEDS ISTAT 8.  CURRENT EKG IN CHART AND Epic.  PT PCP CLEARANCE W/ CHART (PT WAS AT [facility] 11-19-2016 AND CANCELLED DUE TO CBG 482 )  AND STATED PT CBG NOW AVERAGE 125 IN AM AFTER MEDICATION CHANGE.

## 2017-02-18 ENCOUNTER — Ambulatory Visit (HOSPITAL_BASED_OUTPATIENT_CLINIC_OR_DEPARTMENT_OTHER)
Admission: RE | Admit: 2017-02-18 | Discharge: 2017-02-18 | Disposition: A | Discharge status: Home / Self Care | Payer: Medicare Other | Source: Ambulatory Visit | Attending: Urology | Admitting: Urology

## 2017-02-18 ENCOUNTER — Ambulatory Visit (HOSPITAL_BASED_OUTPATIENT_CLINIC_OR_DEPARTMENT_OTHER): Payer: Medicare Other | Admitting: Anesthesiology

## 2017-02-18 ENCOUNTER — Encounter (HOSPITAL_BASED_OUTPATIENT_CLINIC_OR_DEPARTMENT_OTHER)
Admission: RE | Disposition: A | Discharge status: Home / Self Care | Payer: Self-pay | Source: Ambulatory Visit | Attending: Urology

## 2017-02-18 ENCOUNTER — Encounter (HOSPITAL_BASED_OUTPATIENT_CLINIC_OR_DEPARTMENT_OTHER): Payer: Self-pay

## 2017-02-18 ENCOUNTER — Ambulatory Visit: Payer: Medicare Other | Admitting: Rheumatology

## 2017-02-18 DIAGNOSIS — Z888 Allergy status to other drugs, medicaments and biological substances status: Secondary | ICD-10-CM | POA: Diagnosis not present

## 2017-02-18 DIAGNOSIS — Z87442 Personal history of urinary calculi: Secondary | ICD-10-CM | POA: Insufficient documentation

## 2017-02-18 DIAGNOSIS — I5032 Chronic diastolic (congestive) heart failure: Secondary | ICD-10-CM | POA: Diagnosis not present

## 2017-02-18 DIAGNOSIS — Z794 Long term (current) use of insulin: Secondary | ICD-10-CM | POA: Insufficient documentation

## 2017-02-18 DIAGNOSIS — Z7901 Long term (current) use of anticoagulants: Secondary | ICD-10-CM | POA: Insufficient documentation

## 2017-02-18 DIAGNOSIS — Z87891 Personal history of nicotine dependence: Secondary | ICD-10-CM | POA: Insufficient documentation

## 2017-02-18 DIAGNOSIS — Z96652 Presence of left artificial knee joint: Secondary | ICD-10-CM | POA: Insufficient documentation

## 2017-02-18 DIAGNOSIS — I480 Paroxysmal atrial fibrillation: Secondary | ICD-10-CM | POA: Diagnosis not present

## 2017-02-18 DIAGNOSIS — Z79899 Other long term (current) drug therapy: Secondary | ICD-10-CM | POA: Insufficient documentation

## 2017-02-18 DIAGNOSIS — Z91048 Other nonmedicinal substance allergy status: Secondary | ICD-10-CM | POA: Insufficient documentation

## 2017-02-18 DIAGNOSIS — E119 Type 2 diabetes mellitus without complications: Secondary | ICD-10-CM | POA: Insufficient documentation

## 2017-02-18 DIAGNOSIS — Z91040 Latex allergy status: Secondary | ICD-10-CM | POA: Diagnosis not present

## 2017-02-18 DIAGNOSIS — N200 Calculus of kidney: Secondary | ICD-10-CM | POA: Insufficient documentation

## 2017-02-18 DIAGNOSIS — I110 Hypertensive heart disease with heart failure: Secondary | ICD-10-CM | POA: Diagnosis not present

## 2017-02-18 HISTORY — DX: Long term (current) use of anticoagulants: Z79.01

## 2017-02-18 HISTORY — DX: Arthropathic psoriasis, unspecified: L40.50

## 2017-02-18 HISTORY — DX: Obstructive sleep apnea (adult) (pediatric): G47.33

## 2017-02-18 HISTORY — DX: Type 2 diabetes mellitus without complications: E11.9

## 2017-02-18 HISTORY — DX: Hyperlipidemia, unspecified: E78.5

## 2017-02-18 HISTORY — PX: CYSTOSCOPY WITH RETROGRADE PYELOGRAM, URETEROSCOPY AND STENT PLACEMENT: SHX5789

## 2017-02-18 HISTORY — DX: Post-traumatic stress disorder, unspecified: F43.10

## 2017-02-18 HISTORY — PX: HOLMIUM LASER APPLICATION: SHX5852

## 2017-02-18 HISTORY — DX: Other persistent atrial fibrillation: I48.19

## 2017-02-18 LAB — POCT I-STAT, CHEM 8
BUN: 22 mg/dL — ABNORMAL HIGH (ref 6–20)
Calcium, Ion: 1.2 mmol/L (ref 1.15–1.40)
Chloride: 107 mmol/L (ref 101–111)
Creatinine, Ser: 0.7 mg/dL (ref 0.61–1.24)
Glucose, Bld: 154 mg/dL — ABNORMAL HIGH (ref 65–99)
HEMATOCRIT: 35 % — AB (ref 39.0–52.0)
HEMOGLOBIN: 11.9 g/dL — AB (ref 13.0–17.0)
POTASSIUM: 4.2 mmol/L (ref 3.5–5.1)
SODIUM: 142 mmol/L (ref 135–145)
TCO2: 22 mmol/L (ref 22–32)

## 2017-02-18 LAB — GLUCOSE, CAPILLARY: Glucose-Capillary: 161 mg/dL — ABNORMAL HIGH (ref 65–99)

## 2017-02-18 SURGERY — CYSTOURETEROSCOPY, WITH RETROGRADE PYELOGRAM AND STENT INSERTION
Anesthesia: General | Site: Ureter | Laterality: Right

## 2017-02-18 MED ORDER — LIDOCAINE HCL (CARDIAC) 20 MG/ML IV SOLN
INTRAVENOUS | Status: DC | PRN
  Administered 2017-02-18: 100 mg via INTRAVENOUS

## 2017-02-18 MED ORDER — DEXAMETHASONE SODIUM PHOSPHATE 4 MG/ML IJ SOLN
INTRAMUSCULAR | Status: DC | PRN
  Administered 2017-02-18: 5 mg via INTRAVENOUS

## 2017-02-18 MED ORDER — KETOROLAC TROMETHAMINE 30 MG/ML IJ SOLN
INTRAMUSCULAR | Status: AC
  Filled 2017-02-18: qty 1

## 2017-02-18 MED ORDER — PHENYLEPHRINE HCL 10 MG/ML IJ SOLN
INTRAMUSCULAR | Status: DC | PRN
  Administered 2017-02-18 (×4): 40 ug via INTRAVENOUS

## 2017-02-18 MED ORDER — ONDANSETRON HCL 4 MG/2ML IJ SOLN
INTRAMUSCULAR | Status: DC | PRN
  Administered 2017-02-18: 4 mg via INTRAVENOUS

## 2017-02-18 MED ORDER — FENTANYL CITRATE (PF) 100 MCG/2ML IJ SOLN
INTRAMUSCULAR | Status: AC
  Filled 2017-02-18: qty 2

## 2017-02-18 MED ORDER — EPHEDRINE 5 MG/ML INJ
INTRAVENOUS | Status: AC
  Filled 2017-02-18: qty 10

## 2017-02-18 MED ORDER — IOHEXOL 300 MG/ML  SOLN
INTRAMUSCULAR | Status: DC | PRN
  Administered 2017-02-18: 10 mL

## 2017-02-18 MED ORDER — PHENYLEPHRINE HCL 10 MG/ML IJ SOLN
INTRAMUSCULAR | Status: AC
  Filled 2017-02-18: qty 1

## 2017-02-18 MED ORDER — GLYCOPYRROLATE 0.2 MG/ML IJ SOLN
INTRAMUSCULAR | Status: DC | PRN
  Administered 2017-02-18: 0.2 mg via INTRAVENOUS

## 2017-02-18 MED ORDER — FENTANYL CITRATE (PF) 100 MCG/2ML IJ SOLN
INTRAMUSCULAR | Status: DC | PRN
  Administered 2017-02-18 (×3): 25 ug via INTRAVENOUS
  Administered 2017-02-18: 50 ug via INTRAVENOUS
  Administered 2017-02-18 (×3): 25 ug via INTRAVENOUS

## 2017-02-18 MED ORDER — LACTATED RINGERS IV SOLN
INTRAVENOUS | Status: DC
  Administered 2017-02-18 (×2): via INTRAVENOUS
  Filled 2017-02-18: qty 1000

## 2017-02-18 MED ORDER — CEFAZOLIN SODIUM-DEXTROSE 2-4 GM/100ML-% IV SOLN
INTRAVENOUS | Status: AC
  Filled 2017-02-18: qty 100

## 2017-02-18 MED ORDER — SODIUM CHLORIDE 0.9 % IR SOLN
Status: DC | PRN
  Administered 2017-02-18: 4000 mL

## 2017-02-18 MED ORDER — CEFAZOLIN SODIUM-DEXTROSE 1-4 GM/50ML-% IV SOLN
INTRAVENOUS | Status: AC
  Filled 2017-02-18: qty 50

## 2017-02-18 MED ORDER — LIDOCAINE 2% (20 MG/ML) 5 ML SYRINGE
INTRAMUSCULAR | Status: AC
  Filled 2017-02-18: qty 5

## 2017-02-18 MED ORDER — ONDANSETRON HCL 4 MG/2ML IJ SOLN
INTRAMUSCULAR | Status: AC
  Filled 2017-02-18: qty 2

## 2017-02-18 MED ORDER — STERILE WATER FOR IRRIGATION IR SOLN
Status: DC | PRN
  Administered 2017-02-18: 500 mL

## 2017-02-18 MED ORDER — PROPOFOL 10 MG/ML IV BOLUS
INTRAVENOUS | Status: DC | PRN
  Administered 2017-02-18: 200 mg via INTRAVENOUS

## 2017-02-18 MED ORDER — EPHEDRINE SULFATE 50 MG/ML IJ SOLN
INTRAMUSCULAR | Status: DC | PRN
  Administered 2017-02-18: 10 mg via INTRAVENOUS

## 2017-02-18 MED ORDER — CEFAZOLIN SODIUM-DEXTROSE 2-4 GM/100ML-% IV SOLN
2.0000 g | Freq: Once | INTRAVENOUS | Status: AC
  Administered 2017-02-18: 3 g via INTRAVENOUS
  Filled 2017-02-18: qty 100

## 2017-02-18 MED ORDER — PROPOFOL 10 MG/ML IV BOLUS
INTRAVENOUS | Status: AC
  Filled 2017-02-18: qty 40

## 2017-02-18 MED ORDER — DEXAMETHASONE SODIUM PHOSPHATE 10 MG/ML IJ SOLN
INTRAMUSCULAR | Status: AC
  Filled 2017-02-18: qty 1

## 2017-02-18 MED ORDER — KETOROLAC TROMETHAMINE 30 MG/ML IJ SOLN
INTRAMUSCULAR | Status: DC | PRN
  Administered 2017-02-18: 30 mg via INTRAVENOUS

## 2017-02-18 MED ORDER — DEXTROSE 5 % IV SOLN
1000.0000 mg | Freq: Once | INTRAVENOUS | Status: DC
  Filled 2017-02-18: qty 10

## 2017-02-18 MED ORDER — GLYCOPYRROLATE 0.2 MG/ML IV SOSY
PREFILLED_SYRINGE | INTRAVENOUS | Status: AC
  Filled 2017-02-18: qty 5

## 2017-02-18 SURGICAL SUPPLY — 22 items
BAG DRAIN URO-CYSTO SKYTR STRL (DRAIN) ×2 IMPLANT
BASKET ZERO TIP NITINOL 2.4FR (BASKET) ×2 IMPLANT
CATH INTERMIT  6FR 70CM (CATHETERS) ×2 IMPLANT
CLOTH BEACON ORANGE TIMEOUT ST (SAFETY) ×2 IMPLANT
FIBER LASER TRAC TIP (UROLOGICAL SUPPLIES) ×2 IMPLANT
GLOVE BIO SURGEON STRL SZ7.5 (GLOVE) IMPLANT
GLOVE BIOGEL PI IND STRL 6.5 (GLOVE) ×2 IMPLANT
GLOVE BIOGEL PI INDICATOR 6.5 (GLOVE) ×2
GOWN STRL REUS W/TWL LRG LVL3 (GOWN DISPOSABLE) ×2 IMPLANT
GOWN STRL REUS W/TWL XL LVL3 (GOWN DISPOSABLE) ×2 IMPLANT
GUIDEWIRE STR DUAL SENSOR (WIRE) ×4 IMPLANT
INFUSOR MANOMETER BAG 3000ML (MISCELLANEOUS) ×2 IMPLANT
IV NS 1000ML (IV SOLUTION) ×1
IV NS 1000ML BAXH (IV SOLUTION) ×1 IMPLANT
IV NS IRRIG 3000ML ARTHROMATIC (IV SOLUTION) ×2 IMPLANT
KIT RM TURNOVER CYSTO AR (KITS) ×2 IMPLANT
MANIFOLD NEPTUNE II (INSTRUMENTS) ×2 IMPLANT
NS IRRIG 500ML POUR BTL (IV SOLUTION) IMPLANT
PACK CYSTO (CUSTOM PROCEDURE TRAY) ×2 IMPLANT
STENT URET 6FRX26 CONTOUR (STENTS) ×2 IMPLANT
TUBE CONNECTING 12X1/4 (SUCTIONS) ×2 IMPLANT
WATER STERILE IRR 500ML POUR (IV SOLUTION) ×2 IMPLANT

## 2017-02-18 NOTE — Op Note (Signed)
Operative Note  Preoperative diagnosis:  1.  Right renal calculus  Postoperative diagnosis: 1.  Right renal calculus  Procedure(s): 1.  Cystoscopy with right retrograde pyelogram, right ureteroscopy with laser lithotripsy, ureteroscopic stone extraction, ureteral stent placement  Surgeon: Link Snuffer, MD  Assistants: None  Anesthesia: General  Complications: None immediate  EBL: Minimal  Specimens: 1.  Renal calculus  Drains/Catheters: 1.  6 x 26 double-J ureteral stent  Intraoperative findings: Normal urethra and bladder 2.  Large right renal pelvis stone, smaller right lower pole calculus. 3.  Retrograde pyelogram showed a well opacified kidney with no obvious filling defects.  Indication: 71 year old male with intermittent right flank pain found to have a large right renal pelvis stone desires intervention.  Description of procedure:  Patient was identified and consent was obtained.  He was taken to the operating room and placed in supine position.  He was placed under general anesthesia.  He was converted to dorsal lithotomy.  He was prepped and draped in standard sterile fashion.  Timeout was performed.  Perioperative antibiotics were administered.  I advanced a 28 French rigid cystoscope into the urethra and into the bladder and performed a cystoscopy with findings noted above.  I cannulated the right ureter with a open-ended ureteral catheter and shot a retrograde pyelogram with the findings noted above.  I then advanced a sensor wire up into the kidney under fluoroscopic guidance.  I passed a second sensor wire alongside this wire under fluoroscopic guidance into the kidney.  I then advanced a 12 x 14 ureteral access sheath over 1 of the wires under continuous fluoroscopic guidance of the ureter.  Flexible ureteroscopy identified a large renal pelvis stone and a smaller lower pole calculus.  These were both fragmented into smaller fragments with the laser fiber.  I used a  basket to basket extract several large stone fragments until only tiny stone fragments remained.  There were no other stone fragments large enough to basket.  I then withdrew the scope along with the access sheath and visualized the entire ureter upon removal.  There were no ureteral calculi and there was no ureteral injury.  I backloaded the wire onto a cystoscope and advanced that into the bladder followed by placement of a 6 x 26 double-J ureteral stent in a standard fashion.  I withdrew the wire and fluoroscopy confirmed proximal placement and direct visualization confirmed a good coil within the bladder.  I drained the bladder and withdrew the scope and this concluded the operation.  Patient tolerated procedure well and was stable postoperatively.  Plan: The patient will return in 7-10 days for a ureteral stent removal.

## 2017-02-18 NOTE — H&P (Signed)
CC: I have kidney stones.  HPI: [name] is a 71 year-old male established patient who is here for renal calculi.  The problem is on the right side. This is not his first kidney stone. He has had more than 5 stones prior to getting this one. He is currently having flank pain and back pain. He denies having groin pain, nausea, vomiting, fever, and chills. He has not caught a stone in his urine strainer since his symptoms began.   He has had eswl, ureteral stent, and ureteroscopy for treatment of his stones in the past.   The patient was previously scheduled for a right ureteroscopy with laser lithotripsy. It was canceled due to high blood sugars. These are now under control.     ALLERGIES:  acyclovir Adhesive tape amlodipine Latex Oxycodone Verapamil Zithromax TABS    MEDICATIONS: Lisinopril 2.5 mg tablet  Metformin Hcl  Tamsulosin Hcl 0.4 mg capsule, ext release 24 hr TAKE ONE CAPSULE BY MOUTH ONCE DAILY  Urocit-K 10 meq (1,080 mg) tablet, extended release 2 tablet PO BID  Zyrtec  Acebutolol Hcl  Citracal-Vitamin D 200 mg calcium-250 unit tablet Oral  Eliquis 5 mg tablet Oral  Glipizide  Ibuprofen 200 MG Oral Tablet Oral  Lasix 20 mg tablet Oral  Otezla 30 mg tablet 0 Oral  Pravastatin Sodium  PreserVision AREDS CAPS Oral  PROzac 20 MG Oral Capsule Oral  Ropinirole Hcl 3 mg tablet Oral  Vitamin D3 2,000 unit tablet Oral     GU PSH: Cysto Uretero Lithotripsy - Jul 09, 2013 Cystoscopy Insert Stent - 07/10/2014, July 10, 2014, 09-Jul-2013 ESWL - 07-10-2014 Ureteroscopic stone removal - 07-10-2014      PSH Notes: Cystoscopy With Ureteroscopy With Removal Of Calculus, Cystoscopy With Insertion Of Ureteral Stent Right, Lithotripsy, Cystoscopy With Insertion Of Ureteral Stent Right, Cystoscopy With Insertion Of Ureteral Stent Right, Cystoscopy With Ureteroscopy With Lithotripsy, Eye Surgery, Skull Excision, Cardiac cath   NON-GU PSH: Total Knee Replacement, 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, Right - 08/31/2015    GU PMH: Renal  calculus, Nephrolithiasis - 2015-07-10 Gross hematuria, Gross hematuria - 10-Jul-2014 Other microscopic hematuria, Microscopic hematuria - 10-Jul-2014 BPH w/o LUTS, Benign hypertrophy of prostate - 07-09-2013      PMH Notes:  2013-01-13 09:07:07 - Note: Arthritis   NON-GU PMH: Encounter for general adult medical examination without abnormal findings, Encounter for preventive health examination - 07/10/15 Hyperuricemia, Hyperuricemia - 07-10-2014 Anxiety, Anxiety (Symptom) - July 09, 2012 Arrhythmia, Rhythm Disorder - July 09, 2012 Gout, Gout - 07-09-2012 Personal history of other diseases of the circulatory system, History of cardiac disorder - 07-09-12 Personal history of other diseases of the nervous system and sense organs, History of sleep apnea - 2012-07-09 Personal history of other mental and behavioral disorders, History of depression - 07-09-12 Personal history of other specified conditions, History of heartburn - 07/09/2012 Unspecified atrial fibrillation, Atrial Fibrillation - 2012/07/09    FAMILY HISTORY: Death In The Family Father - Father Death In The Family Mother - Mother Family Health Status Number - Runs In Family Heart Disease - Father, Brother   SOCIAL HISTORY: Marital Status: Married Preferred Language: English; Race: White Current Smoking Status: Patient does not smoke anymore. Has not smoked since 11/27/1981.  Social Drinker.  Drinks 1 caffeinated drink per day. Patient's occupation is/was Retired.    REVIEW OF SYSTEMS:    GU Review Male:   Patient reports hard to postpone urination, get up at night to urinate, and stream starts and stops. Patient denies frequent urination, burning/ pain with urination, leakage of  urine, trouble starting your stream, have to strain to urinate , erection problems, and penile pain.  Gastrointestinal (Upper):   Patient denies nausea, vomiting, and indigestion/ heartburn.  Gastrointestinal (Lower):   Patient denies diarrhea and constipation.  Constitutional:   Patient denies fever, night sweats, weight loss, and  fatigue.  Skin:   Patient denies skin rash/ lesion and itching.  Eyes:   Patient denies blurred vision and double vision.  Ears/ Nose/ Throat:   Patient denies sore throat and sinus problems.  Hematologic/Lymphatic:   Patient denies swollen glands and easy bruising.  Cardiovascular:   Patient denies leg swelling and chest pains.  Respiratory:   Patient denies cough and shortness of breath.  Endocrine:   Patient denies excessive thirst.  Musculoskeletal:   Patient reports joint pain. Patient denies back pain.  Neurological:   Patient denies headaches and dizziness.  Psychologic:   Patient denies depression and anxiety.   VITAL SIGNS:      02/13/2017 10:18 AM  Weight 278 lb / 126.1 kg  Height 69.5 in / 176.53 cm  BP 136/66 mmHg  Pulse 63 /min  Temperature 98.0 F / 36.6 C  BMI 40.5 kg/m   MULTI-SYSTEM PHYSICAL EXAMINATION:    Constitutional: Well-nourished. No physical deformities. Normally developed. Good grooming.  Respiratory: No labored breathing, no use of accessory muscles.   Cardiovascular: Normal temperature, adequate peripheral perfusion  Skin: No paleness, no jaundice,  Neurologic / Psychiatric: Oriented to time, oriented to place, oriented to person. No depression, no anxiety, no agitation.  Gastrointestinal: No mass, no tenderness, no rigidity, non obese abdomen. No CVA tenderness bilaterally  Eyes: Normal conjunctivae. Normal eyelids.  Musculoskeletal: Normal gait and station of head and neck.     PAST DATA REVIEWED:  Source Of History:  Patient   03/10/14  PSA  Total PSA 2.49     PROCEDURES:          Urinalysis w/Scope Dipstick Dipstick Cont'd Micro  Color: Yellow Bilirubin: Neg WBC/hpf: NS (Not Seen)  Appearance: Clear Ketones: Neg RBC/hpf: 3 - 10/hpf  Specific Gravity: 1.025 Blood: 1+ Bacteria: Rare (0-9/hpf)  pH: 5.5 Protein: Trace Cystals: NS (Not Seen)  Glucose: Neg Urobilinogen: 0.2 Casts: Hyaline    Nitrites: Neg Trichomonas: Not Present     Leukocyte Esterase: Neg Mucous: Present      Epithelial Cells: 0 - 5/hpf      Yeast: NS (Not Seen)      Sperm: Not Present    ASSESSMENT:      ICD-10 Details  1 GU:   Renal calculus - N20.0    PLAN:            Medications Stop Meds: Ditropan Xl 5 mg tablet, extended release 24 hr 1 tablet PO Daily  Start: 11/19/2016  Stop: 06/17/2017   Discontinue: 02/13/2017  - Reason: pt never took medication           Orders Labs Urine Culture          Document Letter(s):  Created for Patient: Clinical Summary         Notes:   We discussed the management of urinary stones. These options include observation, ureteroscopy, and shockwave lithotripsy. We discussed which options are relevant to these particular stones. We discussed the natural history of stones as well as the complications of untreated stones and the impact on quality of life without treatment as well as with each of the above listed treatments. We also discussed the efficacy of each treatment  in its ability to clear the stone burden. With any of these management options I discussed the signs and symptoms of infection and the need for emergent treatment should these be experienced. For each option we discussed the ability of each procedure to clear the patient of their stone burden.   For observation I described the risks which include but are not limited to silent renal damage, life-threatening infection, need for emergent surgery, failure to pass stone, and pain.   For ureteroscopy I described the risks which include heart attack, stroke, pulmonary embolus, death, bleeding, infection, damage to contiguous structures, positioning injury, ureteral stricture, ureteral avulsion, ureteral injury, need for ureteral stent, inability to perform ureteroscopy, need for an interval procedure, inability to clear stone burden, stent discomfort and pain.   For shockwave lithotripsy I described the risks which include arrhythmia, kidney contusion,  kidney hemorrhage, need for transfusion, pain, inability to break up stone, inability to pass stone fragments, Steinstrasse, infection associated with obstructing stones, need for different surgical procedure, need for repeat shockwave lithotripsy, and death.    To proceed with ureteroscopy with laser lithotripsy and ureteral stent placement.    Signed by Link Snuffer, III, M.D. on 02/13/17 at 10:34 AM (EST

## 2017-02-18 NOTE — Transfer of Care (Signed)
Immediate Anesthesia Transfer of Care Note  Patient: [name]  Procedure(s) Performed: Procedure(s) (LRB): CYSTOSCOPY WITH RIGHT RETROGRADE PYELOGRAM, URETEROSCOPY AND STENT PLACEMENT (Right) HOLMIUM LASER APPLICATION (Right)  Patient Location: PACU  Anesthesia Type: General  Level of Consciousness: awake, sedated, patient cooperative and responds to stimulation  Airway & Oxygen Therapy: Patient Spontanous Breathing and Patient connected to face mask oxygen  Post-op Assessment: Report given to PACU RN, Post -op Vital signs reviewed and stable and Patient moving all extremities  Post vital signs: Reviewed and stable  Complications: No apparent anesthesia complications

## 2017-02-18 NOTE — Anesthesia Preprocedure Evaluation (Signed)
Anesthesia Evaluation  Patient identified by MRN, date of birth, ID band Patient awake    Reviewed: Allergy & Precautions, NPO status , Patient's Chart, lab work & pertinent test results  History of Anesthesia Complications (+) PONV  Airway Mallampati: II  TM Distance: >3 FB Neck ROM: Full    Dental no notable dental hx.    Pulmonary shortness of breath, sleep apnea , former smoker,    Pulmonary exam normal breath sounds clear to auscultation       Cardiovascular hypertension, +CHF  Normal cardiovascular exam+ dysrhythmias Atrial Fibrillation  Rhythm:Regular Rate:Normal     Neuro/Psych    GI/Hepatic Neg liver ROS, GERD  ,  Endo/Other  diabetes  Renal/GU Renal disease     Musculoskeletal   Abdominal Normal abdominal exam  (+)   Peds  Hematology   Anesthesia Other Findings   Reproductive/Obstetrics                             Anesthesia Physical  Anesthesia Plan  ASA: III  Anesthesia Plan: General   Post-op Pain Management:    Induction: Intravenous  PONV Risk Score and Plan: 3 and Ondansetron, Dexamethasone, Midazolam, Treatment may vary due to age or medical condition and Propofol infusion  Airway Management Planned: LMA  Additional Equipment:   Intra-op Plan:   Post-operative Plan: Extubation in OR  Informed Consent: I have reviewed the patients History and Physical, chart, labs and discussed the procedure including the risks, benefits and alternatives for the proposed anesthesia with the patient or authorized representative who has indicated his/her understanding and acceptance.   Dental advisory given  Plan Discussed with: CRNA and Anesthesiologist  Anesthesia Plan Comments:         Anesthesia Quick Evaluation                                   Anesthesia Evaluation  Patient identified by MRN, date of birth, ID band Patient awake    Reviewed: Allergy &  Precautions, NPO status , Patient's Chart, lab work & pertinent test results  History of Anesthesia Complications (+) PONV  Airway Mallampati: II  TM Distance: >3 FB Neck ROM: Full    Dental no notable dental hx.    Pulmonary sleep apnea and Continuous Positive Airway Pressure Ventilation , former smoker,  breath sounds clear to auscultation  Pulmonary exam normal       Cardiovascular hypertension, Pt. on medications Normal cardiovascular exam+ dysrhythmias Atrial Fibrillation and Ventricular Tachycardia Rhythm:Regular Rate:Normal     Neuro/Psych negative neurological ROS  negative psych ROS   GI/Hepatic negative GI ROS, Neg liver ROS,   Endo/Other  diabetes, Type 2Morbid obesity  Renal/GU negative Renal ROS  negative genitourinary   Musculoskeletal negative musculoskeletal ROS (+)   Abdominal   Peds negative pediatric ROS (+)  Hematology negative hematology ROS (+)   Anesthesia Other Findings   Reproductive/Obstetrics negative OB ROS                            Anesthesia Physical  Anesthesia Plan  ASA: III  Anesthesia Plan: General   Post-op Pain Management:    Induction: Intravenous  Airway Management Planned: LMA  Additional Equipment:   Intra-op Plan:   Post-operative Plan: Extubation in OR  Informed Consent: I have reviewed the patients History and Physical,  chart, labs and discussed the procedure including the risks, benefits and alternatives for the proposed anesthesia with the patient or authorized representative who has indicated his/her understanding and acceptance.   Dental advisory given  Plan Discussed with: CRNA  Anesthesia Plan Comments:         Anesthesia Quick Evaluation

## 2017-02-18 NOTE — Discharge Instructions (Addendum)
[facility] [phone] Post Ureteroscopy With or Without Stent Instructions  Definitions:  Ureter: The duct that transports urine from the kidney to the bladder. Stent:   A plastic hollow tube that is placed into the ureter, from the kidney to the bladder to prevent the ureter from swelling shut.  GENERAL INSTRUCTIONS:  Despite the fact that no skin incisions were used, the area around the ureter and bladder is raw and irritated. The stent is a foreign body which will further irritate the bladder wall. This irritation is manifested by increased frequency of urination, both day and night, and by an increase in the urge to urinate. In some, the urge to urinate is present almost always. Sometimes the urge is strong enough that you may not be able to stop yourself from urinating. The only real cure is to remove the stent and then give time for the bladder wall to heal which can't be done until the danger of the ureter swelling shut has passed, which varies.  You may see some blood in your urine while the stent is in place and a few days afterwards. Do not be alarmed, even if the urine was clear for a while. Get off your feet and drink lots of fluids until clearing occurs. If you start to pass clots or don't improve, call us.  DIET: You may return to your normal diet immediately. Because of the raw surface of your bladder, alcohol, spicy foods, acid type foods and drinks with caffeine may cause irritation or frequency and should be used in moderation. To keep your urine flowing freely and to avoid constipation, drink plenty of fluids during the day ( 8-10 glasses ). Tip: Avoid cranberry juice because it is very acidic.  ACTIVITY: Your physical activity doesn't need to be restricted. However, if you are very active, you may see some blood in your urine. We suggest that you reduce your activity under these circumstances until the bleeding has stopped.  BOWELS: It is important to  keep your bowels regular during the postoperative period. Straining with bowel movements can cause bleeding. A bowel movement every other day is reasonable. Use a mild laxative if needed, such as Milk of Magnesia 2-3 tablespoons, or 2 Dulcolax tablets. Call if you continue to have problems. If you have been taking narcotics for pain, before, during or after your surgery, you may be constipated. Take a laxative if necessary.   MEDICATION: You should resume your pre-surgery medications unless told not to. You may take oxybutynin or flomax if prescribed for bladder spasms or discomfort from the stent Take pain medication as directed for pain refractory to conservative management  Do not take any nonsteroidal anti inflammatories (Advil, aleve, Motrin, Ibuprofen) until after 7:45 pm today.  PROBLEMS YOU SHOULD REPORT TO [place]:  Fevers over 100.5 Fahrenheit.  Heavy bleeding, or clots ( See above notes about blood in urine ).  Inability to urinate.  Drug reactions ( hives, rash, nausea, vomiting, diarrhea ).  Severe burning or pain with urination that is not improving.  CYSTOSCOPY HOME CARE INSTRUCTIONS  Activity: Rest for the remainder of the day.  Do not drive or operate equipment today.  You may resume normal activities in one to two days as instructed by your physician.   Meals: Drink plenty of liquids and eat light foods such as gelatin or soup this evening.  You may return to a normal meal plan tomorrow.  Return to Work: You may return to work in one to  two days or as instructed by your physician.  Special Instructions / Symptoms: Call your physician if any of these symptoms occur:   -persistent or heavy bleeding  -bleeding which continues after first few urination  -large blood clots that are difficult to pass  -urine stream diminishes or stops completely  -fever equal to or higher than 101 degrees Farenheit.  -cloudy urine with a strong, foul odor  -severe pain  Females  should always wipe from front to back after elimination.  You may feel some burning pain when you urinate.  This should disappear with time.  Applying moist heat to the lower abdomen or a hot tub bath may help relieve the pain. \  Follow-Up / Date of Return Visit to Your Physician:  As instructed Call for an appointment to arrange follow-up.  Patient Signature:  ________________________________________________________  Nurse's Signature:  ________________________________________________________  Post Anesthesia Home Care Instructions  Activity: Get plenty of rest for the remainder of the day. A responsible individual must stay with you for 24 hours following the procedure.  For the next 24 hours, DO NOT: -Drive a car -Paediatric nurse -Drink alcoholic beverages -Take any medication unless instructed by your physician -Make any legal decisions or sign important papers.  Meals: Start with liquid foods such as gelatin or soup. Progress to regular foods as tolerated. Avoid greasy, spicy, heavy foods. If nausea and/or vomiting occur, drink only clear liquids until the nausea and/or vomiting subsides. Call your physician if vomiting continues.  Special Instructions/Symptoms: Your throat may feel dry or sore from the anesthesia or the breathing tube placed in your throat during surgery. If this causes discomfort, gargle with warm salt water. The discomfort should disappear within 24 hours.  If you had a scopolamine patch placed behind your ear for the management of post- operative nausea and/or vomiting:  1. The medication in the patch is effective for 72 hours, after which it should be removed.  Wrap patch in a tissue and discard in the trash. Wash hands thoroughly with soap and water. 2. You may remove the patch earlier than 72 hours if you experience unpleasant side effects which may include dry mouth, dizziness or visual disturbances. 3. Avoid touching the patch. Wash your hands with soap  and water after contact with the patch.

## 2017-02-18 NOTE — Anesthesia Procedure Notes (Signed)
Procedure Name: LMA Insertion Date/Time: 02/18/2017 12:18 PM Performed by: Justice Rocher, CRNA Pre-anesthesia Checklist: Patient identified, Emergency Drugs available, Suction available and Patient being monitored Patient Re-evaluated:Patient Re-evaluated prior to induction Oxygen Delivery Method: Circle system utilized Preoxygenation: Pre-oxygenation with 100% oxygen Induction Type: IV induction Ventilation: Mask ventilation without difficulty LMA: LMA with gastric port inserted LMA Size: 5.0 Number of attempts: 1 Airway Equipment and Method: Bite block Placement Confirmation: positive ETCO2 and breath sounds checked- equal and bilateral Tube secured with: Tape Dental Injury: Teeth and Oropharynx as per pre-operative assessment  Comments: PreO2 well with FM prior to induction  Pt positioned with wedge support pillow ramped for airway management

## 2017-02-18 NOTE — Anesthesia Postprocedure Evaluation (Signed)
Anesthesia Post Note  Patient: [name]  Procedure(s) Performed: CYSTOSCOPY WITH RIGHT RETROGRADE PYELOGRAM, URETEROSCOPY AND STENT PLACEMENT (Right Ureter) HOLMIUM LASER APPLICATION (Right Ureter)     Patient location during evaluation: PACU Anesthesia Type: General Level of consciousness: awake and alert Pain management: pain level controlled Vital Signs Assessment: post-procedure vital signs reviewed and stable Respiratory status: spontaneous breathing, nonlabored ventilation, respiratory function stable and patient connected to nasal cannula oxygen Cardiovascular status: blood pressure returned to baseline and stable Postop Assessment: no apparent nausea or vomiting Anesthetic complications: no    Last Vitals:  Vitals:   02/18/17 1001 02/18/17 1357  BP: (!) 145/77 137/81  Pulse: 62 66  Resp: 20 (!) 8  Temp: 36.8 C 36.9 C  SpO2: 99% 100%    Last Pain:  Vitals:   02/18/17 1001  TempSrc: Oral                 Lynetta Tomczak

## 2017-02-19 ENCOUNTER — Encounter (HOSPITAL_BASED_OUTPATIENT_CLINIC_OR_DEPARTMENT_OTHER): Payer: Self-pay | Admitting: Urology

## 2017-02-20 ENCOUNTER — Ambulatory Visit (INDEPENDENT_AMBULATORY_CARE_PROVIDER_SITE_OTHER): Payer: Medicare Other | Admitting: Internal Medicine

## 2017-02-20 ENCOUNTER — Encounter: Payer: Self-pay | Admitting: Internal Medicine

## 2017-02-20 VITALS — BP 134/70 | HR 67 | Ht 69.5 in | Wt 280.0 lb

## 2017-02-20 DIAGNOSIS — I5032 Chronic diastolic (congestive) heart failure: Secondary | ICD-10-CM

## 2017-02-20 DIAGNOSIS — I480 Paroxysmal atrial fibrillation: Secondary | ICD-10-CM | POA: Diagnosis not present
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
## 2017-02-20 NOTE — Patient Instructions (Addendum)

## 2017-02-25 ENCOUNTER — Other Ambulatory Visit (INDEPENDENT_AMBULATORY_CARE_PROVIDER_SITE_OTHER): Payer: Medicare Other

## 2017-02-25 DIAGNOSIS — E119 Type 2 diabetes mellitus without complications: Secondary | ICD-10-CM | POA: Diagnosis not present

## 2017-02-25 LAB — CBC WITH DIFFERENTIAL/PLATELET
BASOS PCT: 0.7 % (ref 0.0–3.0)
Basophils Absolute: 0.1 10*3/uL (ref 0.0–0.1)
EOS ABS: 0.3 10*3/uL (ref 0.0–0.7)
Eosinophils Relative: 3.9 % (ref 0.0–5.0)
HCT: 37.3 % — ABNORMAL LOW (ref 39.0–52.0)
Hemoglobin: 12.4 g/dL — ABNORMAL LOW (ref 13.0–17.0)
LYMPHS ABS: 1.6 10*3/uL (ref 0.7–4.0)
LYMPHS PCT: 18.3 % (ref 12.0–46.0)
MCHC: 33.2 g/dL (ref 30.0–36.0)
MCV: 93.3 fl (ref 78.0–100.0)
MONO ABS: 0.6 10*3/uL (ref 0.1–1.0)
Monocytes Relative: 7 % (ref 3.0–12.0)
NEUTROS ABS: 6.3 10*3/uL (ref 1.4–7.7)
NEUTROS PCT: 70.1 % (ref 43.0–77.0)
PLATELETS: 234 10*3/uL (ref 150.0–400.0)
RBC: 4 Mil/uL — ABNORMAL LOW (ref 4.22–5.81)
RDW: 13.7 % (ref 11.5–15.5)
WBC: 8.9 10*3/uL (ref 4.0–10.5)

## 2017-02-25 LAB — COMPREHENSIVE METABOLIC PANEL
ALT: 14 U/L (ref 0–53)
AST: 13 U/L (ref 0–37)
Albumin: 3.9 g/dL (ref 3.5–5.2)
Alkaline Phosphatase: 81 U/L (ref 39–117)
BUN: 19 mg/dL (ref 6–23)
CALCIUM: 9.3 mg/dL (ref 8.4–10.5)
CHLORIDE: 106 meq/L (ref 96–112)
CO2: 26 meq/L (ref 19–32)
CREATININE: 0.8 mg/dL (ref 0.40–1.50)
GFR: 101.4 mL/min (ref >60.00)
GLUCOSE: 175 mg/dL — AB (ref 70–99)
Potassium: 4.5 mEq/L (ref 3.5–5.1)
Sodium: 139 mEq/L (ref 135–145)
Total Bilirubin: 0.7 mg/dL (ref 0.2–1.2)
Total Protein: 7 g/dL (ref 6.0–8.3)

## 2017-02-25 LAB — HEMOGLOBIN A1C: Hgb A1c MFr Bld: 5.8 % (ref 4.6–6.5)

## 2017-02-25 LAB — TSH: TSH: 2.62 u[IU]/mL (ref 0.35–4.50)

## 2017-03-06 ENCOUNTER — Ambulatory Visit: Payer: Medicare Other | Admitting: Rheumatology

## 2017-03-13 DIAGNOSIS — H353131 Nonexudative age-related macular degeneration, bilateral, early dry stage: Secondary | ICD-10-CM | POA: Diagnosis not present

## 2017-03-13 DIAGNOSIS — E119 Type 2 diabetes mellitus without complications: Secondary | ICD-10-CM | POA: Diagnosis not present

## 2017-03-13 LAB — HM DIABETES EYE EXAM

## 2017-03-14 DIAGNOSIS — N200 Calculus of kidney: Secondary | ICD-10-CM | POA: Diagnosis not present

## 2017-03-19 ENCOUNTER — Encounter: Payer: Self-pay | Admitting: Family Medicine

## 2017-03-29 ENCOUNTER — Telehealth: Payer: Self-pay

## 2017-03-29 NOTE — Telephone Encounter (Signed)
Called [employer] Patient [employer] to check the status of pts application. Spoke with a representative who states that the patient has been approved to receive Otezla through 04/01/18. An approval letter will be mailed out at the beginning of the year. Will send document to scan center once received.   Reference number: [id number] Phone Number: [phone]  Called patient to update. Left voicemail.   Stachia Slutsky, [place], CPhT 11:27 AM

## 2017-03-30 ENCOUNTER — Other Ambulatory Visit: Payer: Self-pay | Admitting: Rheumatology

## 2017-04-01 NOTE — Telephone Encounter (Signed)
Last Visit: 02/12/17 Next Visit: 08/13/17  Okay to refill per Dr. Estanislado Pandy

## 2017-04-17 ENCOUNTER — Telehealth: Payer: Self-pay | Admitting: Family Medicine

## 2017-04-17 ENCOUNTER — Other Ambulatory Visit: Payer: Self-pay

## 2017-04-17 ENCOUNTER — Emergency Department (HOSPITAL_COMMUNITY): Payer: Medicare Other

## 2017-04-17 ENCOUNTER — Observation Stay (HOSPITAL_COMMUNITY)
Admission: EM | Admit: 2017-04-17 | Discharge: 2017-04-19 | Disposition: A | Discharge status: Home / Self Care | Payer: Medicare Other | Attending: Family Medicine | Admitting: Family Medicine

## 2017-04-17 ENCOUNTER — Encounter (HOSPITAL_COMMUNITY): Payer: Self-pay | Admitting: Emergency Medicine

## 2017-04-17 DIAGNOSIS — I10 Essential (primary) hypertension: Secondary | ICD-10-CM | POA: Insufficient documentation

## 2017-04-17 DIAGNOSIS — Z7902 Long term (current) use of antithrombotics/antiplatelets: Secondary | ICD-10-CM | POA: Diagnosis not present

## 2017-04-17 DIAGNOSIS — I110 Hypertensive heart disease with heart failure: Secondary | ICD-10-CM | POA: Diagnosis not present

## 2017-04-17 DIAGNOSIS — I2510 Atherosclerotic heart disease of native coronary artery without angina pectoris: Secondary | ICD-10-CM | POA: Insufficient documentation

## 2017-04-17 DIAGNOSIS — R29818 Other symptoms and signs involving the nervous system: Secondary | ICD-10-CM | POA: Diagnosis not present

## 2017-04-17 DIAGNOSIS — Z91040 Latex allergy status: Secondary | ICD-10-CM | POA: Insufficient documentation

## 2017-04-17 DIAGNOSIS — Z87891 Personal history of nicotine dependence: Secondary | ICD-10-CM | POA: Insufficient documentation

## 2017-04-17 DIAGNOSIS — E237 Disorder of pituitary gland, unspecified: Secondary | ICD-10-CM | POA: Diagnosis not present

## 2017-04-17 DIAGNOSIS — Z79899 Other long term (current) drug therapy: Secondary | ICD-10-CM | POA: Diagnosis not present

## 2017-04-17 DIAGNOSIS — E236 Other disorders of pituitary gland: Secondary | ICD-10-CM

## 2017-04-17 DIAGNOSIS — M6281 Muscle weakness (generalized): Secondary | ICD-10-CM | POA: Diagnosis not present

## 2017-04-17 DIAGNOSIS — I5033 Acute on chronic diastolic (congestive) heart failure: Secondary | ICD-10-CM | POA: Diagnosis not present

## 2017-04-17 DIAGNOSIS — Z9114 Patient's other noncompliance with medication regimen: Secondary | ICD-10-CM | POA: Diagnosis not present

## 2017-04-17 DIAGNOSIS — E119 Type 2 diabetes mellitus without complications: Secondary | ICD-10-CM | POA: Diagnosis not present

## 2017-04-17 DIAGNOSIS — I482 Chronic atrial fibrillation, unspecified: Secondary | ICD-10-CM

## 2017-04-17 DIAGNOSIS — I6509 Occlusion and stenosis of unspecified vertebral artery: Secondary | ICD-10-CM | POA: Diagnosis not present

## 2017-04-17 DIAGNOSIS — G459 Transient cerebral ischemic attack, unspecified: Secondary | ICD-10-CM

## 2017-04-17 DIAGNOSIS — Z7984 Long term (current) use of oral hypoglycemic drugs: Secondary | ICD-10-CM | POA: Diagnosis not present

## 2017-04-17 DIAGNOSIS — Z96653 Presence of artificial knee joint, bilateral: Secondary | ICD-10-CM | POA: Diagnosis not present

## 2017-04-17 DIAGNOSIS — I6501 Occlusion and stenosis of right vertebral artery: Principal | ICD-10-CM | POA: Insufficient documentation

## 2017-04-17 DIAGNOSIS — D352 Benign neoplasm of pituitary gland: Secondary | ICD-10-CM | POA: Diagnosis not present

## 2017-04-17 DIAGNOSIS — R531 Weakness: Secondary | ICD-10-CM | POA: Diagnosis present

## 2017-04-17 LAB — COMPREHENSIVE METABOLIC PANEL
ALBUMIN: 3.9 g/dL (ref 3.5–5.0)
ALK PHOS: 87 U/L (ref 38–126)
ALT: 13 U/L — AB (ref 17–63)
ANION GAP: 12 (ref 5–15)
AST: 19 U/L (ref 15–41)
BUN: 22 mg/dL — ABNORMAL HIGH (ref 6–20)
CALCIUM: 9.6 mg/dL (ref 8.9–10.3)
CHLORIDE: 108 mmol/L (ref 101–111)
CO2: 22 mmol/L (ref 22–32)
Creatinine, Ser: 1.2 mg/dL (ref 0.61–1.24)
GFR calc Af Amer: >60 mL/min (ref >60)
GFR calc non Af Amer: 60 mL/min — ABNORMAL LOW (ref >60)
GLUCOSE: 138 mg/dL — AB (ref 65–99)
Potassium: 3.9 mmol/L (ref 3.5–5.1)
SODIUM: 142 mmol/L (ref 135–145)
Total Bilirubin: 1.4 mg/dL — ABNORMAL HIGH (ref 0.3–1.2)
Total Protein: 7.2 g/dL (ref 6.5–8.1)

## 2017-04-17 LAB — PHOSPHORUS: Phosphorus: 4.6 mg/dL (ref 2.5–4.6)

## 2017-04-17 LAB — DIFFERENTIAL
BASOS PCT: 0 %
Basophils Absolute: 0 10*3/uL (ref 0.0–0.1)
EOS PCT: 3 %
Eosinophils Absolute: 0.3 10*3/uL (ref 0.0–0.7)
Lymphocytes Relative: 21 %
Lymphs Abs: 1.9 10*3/uL (ref 0.7–4.0)
Monocytes Absolute: 0.4 10*3/uL (ref 0.1–1.0)
Monocytes Relative: 5 %
NEUTROS ABS: 6.2 10*3/uL (ref 1.7–7.7)
NEUTROS PCT: 71 %

## 2017-04-17 LAB — CBC
HCT: 37.2 % — ABNORMAL LOW (ref 39.0–52.0)
Hemoglobin: 12.5 g/dL — ABNORMAL LOW (ref 13.0–17.0)
MCH: 30.6 pg (ref 26.0–34.0)
MCHC: 33.6 g/dL (ref 30.0–36.0)
MCV: 91 fL (ref 78.0–100.0)
PLATELETS: 217 10*3/uL (ref 150–400)
RBC: 4.09 MIL/uL — AB (ref 4.22–5.81)
RDW: 14.2 % (ref 11.5–15.5)
WBC: 8.8 10*3/uL (ref 4.0–10.5)

## 2017-04-17 LAB — I-STAT TROPONIN, ED: Troponin i, poc: 0 ng/mL (ref 0.00–0.08)

## 2017-04-17 LAB — MAGNESIUM: Magnesium: 1.9 mg/dL (ref 1.7–2.4)

## 2017-04-17 LAB — APTT: aPTT: 30 seconds (ref 24–36)

## 2017-04-17 LAB — I-STAT CHEM 8, ED
BUN: 22 mg/dL — ABNORMAL HIGH (ref 6–20)
CALCIUM ION: 1.2 mmol/L (ref 1.15–1.40)
Chloride: 106 mmol/L (ref 101–111)
Creatinine, Ser: 1.2 mg/dL (ref 0.61–1.24)
Glucose, Bld: 135 mg/dL — ABNORMAL HIGH (ref 65–99)
HEMATOCRIT: 34 % — AB (ref 39.0–52.0)
HEMOGLOBIN: 11.6 g/dL — AB (ref 13.0–17.0)
Potassium: 3.9 mmol/L (ref 3.5–5.1)
SODIUM: 141 mmol/L (ref 135–145)
TCO2: 24 mmol/L (ref 22–32)

## 2017-04-17 LAB — CBG MONITORING, ED: GLUCOSE-CAPILLARY: 126 mg/dL — AB (ref 65–99)

## 2017-04-17 LAB — PROTIME-INR
INR: 1
PROTHROMBIN TIME: 13.1 s (ref 11.4–15.2)

## 2017-04-17 MED ORDER — SENNOSIDES-DOCUSATE SODIUM 8.6-50 MG PO TABS: 1.0000 | ORAL_TABLET | Freq: Every evening | ORAL | Status: DC | PRN

## 2017-04-17 MED ORDER — ROPINIROLE HCL 1 MG PO TABS
1.0000 mg | ORAL_TABLET | Freq: Every day | ORAL | Status: DC
  Administered 2017-04-17 – 2017-04-18 (×2): 1 mg via ORAL
  Filled 2017-04-17 (×2): qty 1

## 2017-04-17 MED ORDER — ASPIRIN 81 MG PO CHEW
324.0000 mg | CHEWABLE_TABLET | Freq: Once | ORAL | Status: AC
  Administered 2017-04-17: 324 mg via ORAL
  Filled 2017-04-17: qty 4

## 2017-04-17 MED ORDER — TAMSULOSIN HCL 0.4 MG PO CAPS
0.4000 mg | ORAL_CAPSULE | Freq: Every day | ORAL | Status: DC
  Administered 2017-04-17 – 2017-04-18 (×2): 0.4 mg via ORAL
  Filled 2017-04-17 (×3): qty 1

## 2017-04-17 MED ORDER — LISINOPRIL 2.5 MG PO TABS
2.5000 mg | ORAL_TABLET | Freq: Every evening | ORAL | Status: DC
  Filled 2017-04-17: qty 1

## 2017-04-17 MED ORDER — APIXABAN 5 MG PO TABS
5.0000 mg | ORAL_TABLET | Freq: Two times a day (BID) | ORAL | Status: DC
  Administered 2017-04-17 – 2017-04-19 (×4): 5 mg via ORAL
  Filled 2017-04-17 (×5): qty 1

## 2017-04-17 MED ORDER — LORATADINE 10 MG PO TABS
10.0000 mg | ORAL_TABLET | Freq: Every day | ORAL | Status: DC
  Administered 2017-04-18 – 2017-04-19 (×2): 10 mg via ORAL
  Filled 2017-04-17 (×2): qty 1

## 2017-04-17 MED ORDER — PANTOPRAZOLE SODIUM 40 MG PO TBEC
40.0000 mg | DELAYED_RELEASE_TABLET | Freq: Every day | ORAL | Status: DC
  Filled 2017-04-17 (×2): qty 1

## 2017-04-17 MED ORDER — ACEBUTOLOL HCL 200 MG PO CAPS
200.0000 mg | ORAL_CAPSULE | Freq: Two times a day (BID) | ORAL | Status: DC
  Administered 2017-04-17 – 2017-04-19 (×4): 200 mg via ORAL
  Filled 2017-04-17 (×4): qty 1

## 2017-04-17 MED ORDER — HYDRALAZINE HCL 20 MG/ML IJ SOLN: 10.0000 mg | Freq: Three times a day (TID) | INTRAMUSCULAR | Status: DC | PRN

## 2017-04-17 MED ORDER — PRAVASTATIN SODIUM 20 MG PO TABS
20.0000 mg | ORAL_TABLET | Freq: Every evening | ORAL | Status: DC
  Administered 2017-04-17 – 2017-04-18 (×2): 20 mg via ORAL
  Filled 2017-04-17 (×3): qty 1

## 2017-04-17 MED ORDER — ACETAMINOPHEN 325 MG PO TABS
650.0000 mg | ORAL_TABLET | ORAL | Status: DC | PRN
  Administered 2017-04-17 – 2017-04-18 (×2): 650 mg via ORAL
  Filled 2017-04-17 (×2): qty 2

## 2017-04-17 MED ORDER — ACETAMINOPHEN 650 MG RE SUPP: 650.0000 mg | RECTAL | Status: DC | PRN

## 2017-04-17 MED ORDER — SODIUM CHLORIDE 0.9 % IV SOLN
INTRAVENOUS | Status: AC
  Administered 2017-04-17: via INTRAVENOUS

## 2017-04-17 MED ORDER — STROKE: EARLY STAGES OF RECOVERY BOOK
Freq: Once | Status: AC
  Administered 2017-04-17
  Filled 2017-04-17: qty 1

## 2017-04-17 MED ORDER — FLUOXETINE HCL 20 MG PO CAPS
20.0000 mg | ORAL_CAPSULE | Freq: Every morning | ORAL | Status: DC
  Administered 2017-04-18 – 2017-04-19 (×2): 20 mg via ORAL
  Filled 2017-04-17 (×2): qty 1

## 2017-04-17 MED ORDER — INSULIN ASPART 100 UNIT/ML ~~LOC~~ SOLN
0.0000 [IU] | Freq: Three times a day (TID) | SUBCUTANEOUS | Status: DC
  Administered 2017-04-19: 2 [IU] via SUBCUTANEOUS

## 2017-04-17 MED ORDER — APREMILAST 30 MG PO TABS: 1.0000 | ORAL_TABLET | Freq: Two times a day (BID) | ORAL | Status: DC

## 2017-04-17 MED ORDER — FLUTICASONE PROPIONATE 50 MCG/ACT NA SUSP: 2.0000 | NASAL | Status: DC | PRN

## 2017-04-17 MED ORDER — ACETAMINOPHEN 160 MG/5ML PO SOLN: 650.0000 mg | ORAL | Status: DC | PRN

## 2017-04-17 MED ORDER — FUROSEMIDE 40 MG PO TABS: 40.0000 mg | ORAL_TABLET | Freq: Every morning | ORAL | Status: DC

## 2017-04-17 NOTE — Code Documentation (Signed)
72yo male arriving to [facility] via private vehicle at [phone].  Patient from home where he was holding his lunch plate and dropped it at 1415.  He attempted to pick it up and had difficulty.  He called his neighbor who came over and called 911.  He also reports difficulty getting his words out and lightheadedness.  [facility] responded, however, patient refused transport d/t request to come to [facility].  Patient's wife drove him to the [facility].  Code stroke called on patient arrival.  Stroke team to the bedside.  Patient to CT with team.  CT completed. NIHSS 1, see documentation for details and code stroke times.  Patient with slight right leg drift.  Patient continues to report lightheadedness when ambulated in CT.  Patient also feels like right arm is weaker.  Dr. Cheral Marker to the bedside.  No treatment with tPA at this time d/t mild symptoms, however, patient remains in the window to treat with tPA until 1845 should symptoms worsen.  Of note, patient with h/o atrial fibrillation on Eliquis.  Patient reports that he has not taken his medication for 2 weeks d/t cost.  Patient to STAT MRI per Dr. Cheral Marker.  MD made aware when MRI DWI completed. Bedside handoff with ED RN Benjamine Mola.
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
## 2017-04-17 NOTE — Telephone Encounter (Signed)
Copied from [place] [id number]. Topic: Quick Communication - See Telephone Encounter >> Apr 17, 2017  2:55 PM Cleaster Corin, NT wrote: CRM for notification. See Telephone encounter for:   04/17/17. Pt. Wife calling to let Dr. [name]  Aware that pt. Possibly had a stroke and ems is taking him to the hospital. Pt. Wife can be reached at [phone]

## 2017-04-17 NOTE — ED Notes (Signed)
Pt transported to MRI without incident

## 2017-04-17 NOTE — ED Notes (Signed)
Patient provided with turkey sandwich.

## 2017-04-17 NOTE — ED Notes (Signed)
PAGED ADMITTING PER RN Jerene Pitch

## 2017-04-17 NOTE — ED Provider Notes (Signed)
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 Supraganglionic infarction (M4-M6 cortex): 3 Total score (0-10 with 10 being normal): 10 IMPRESSION: 1. No acute cortically based infarct or acute intracranial hemorrhage. ASPECTS is 10. 2. Abnormal scalp soft tissues over the left anterior convexity with underlying partial erosion of the left frontal bone outer table. Is there history of scalp skin cancer? 3. Possible 10 mm pituitary mass. Attention to this and the lb 2 findings recommended on a follow-up brain MRI without and with contrast (pituitary protocol preferred) when  feasible. 4. These results were communicated to Dr. Cheral Marker at 3:59 pmon 1/16/2019by text page via the [facility] messaging system. Electronically Signed   By: Genevie Ann M.D.   On: 04/17/2017 15:59    Procedures Procedures (including critical care time)  Medications Ordered in ED Medications - No data to display   Initial Impression / Assessment and Plan / ED Course  I have reviewed the triage vital signs and the nursing  notes.  Pertinent labs & imaging results that were available during my care of the patient were reviewed by me and considered in my medical decision making (see chart for details).   Dr. Cheral Marker (neurology) did see pt upon pt's arrival.  He recommends:  Plan: 1. HgbA1c, fasting lipid panel 2. MRI, MRA  of the brain without contrast 3. PT consult, OT consult, Speech consult 4. Echocardiogram 5. Carotid dopplers 6. Prophylactic therapy- 7. Risk factor modification 8. Telemetry monitoring 9. Frequent neuro checks   Pt d/w Dr. Aggie Moats (triad) for admission.  CRITICAL CARE Performed by: Isla Pence   Total critical care time: 30 minutes  Critical care time was exclusive of separately billable procedures and treating other patients.  Critical care was necessary to treat or prevent imminent or life-threatening deterioration.  Critical care was time spent personally by me on the following activities: development of treatment plan with patient and/or surrogate as well as nursing, discussions with consultants, evaluation of patient's response to treatment, examination of patient, obtaining history from patient or surrogate, ordering and performing treatments and interventions, ordering and review of laboratory studies, ordering and review of radiographic studies, pulse oximetry and re-evaluation of patient's condition.    Final Clinical Impressions(s) / ED Diagnoses   Final diagnoses:  Stenosis of right vertebral artery  TIA (transient ischemic attack)  Pituitary mass (HCC)  Chronic atrial fibrillation ([place])  Noncompliance with medications    ED Discharge Orders    None       Isla Pence, MD 04/17/17 1851

## 2017-04-18 ENCOUNTER — Observation Stay (HOSPITAL_BASED_OUTPATIENT_CLINIC_OR_DEPARTMENT_OTHER): Payer: Medicare Other

## 2017-04-18 ENCOUNTER — Observation Stay (HOSPITAL_COMMUNITY): Payer: Medicare Other

## 2017-04-18 DIAGNOSIS — G459 Transient cerebral ischemic attack, unspecified: Secondary | ICD-10-CM

## 2017-04-18 DIAGNOSIS — E236 Other disorders of pituitary gland: Secondary | ICD-10-CM | POA: Diagnosis not present

## 2017-04-18 DIAGNOSIS — I361 Nonrheumatic tricuspid (valve) insufficiency: Secondary | ICD-10-CM | POA: Diagnosis not present

## 2017-04-18 DIAGNOSIS — I6501 Occlusion and stenosis of right vertebral artery: Secondary | ICD-10-CM | POA: Diagnosis not present

## 2017-04-18 LAB — LIPID PANEL
CHOL/HDL RATIO: 5.1 ratio
CHOLESTEROL: 133 mg/dL (ref 0–200)
HDL: 26 mg/dL — AB (ref >40)
LDL Cholesterol: 68 mg/dL (ref 0–99)
Triglycerides: 197 mg/dL — ABNORMAL HIGH (ref <150)
VLDL: 39 mg/dL (ref 0–40)

## 2017-04-18 LAB — HEMOGLOBIN A1C
Hgb A1c MFr Bld: 5.9 % — ABNORMAL HIGH (ref 4.8–5.6)
MEAN PLASMA GLUCOSE: 122.63 mg/dL

## 2017-04-18 LAB — GLUCOSE, CAPILLARY
GLUCOSE-CAPILLARY: 98 mg/dL (ref 65–99)
GLUCOSE-CAPILLARY: 133 mg/dL — AB (ref 65–99)
Glucose-Capillary: 113 mg/dL — ABNORMAL HIGH (ref 65–99)
Glucose-Capillary: 138 mg/dL — ABNORMAL HIGH (ref 65–99)

## 2017-04-18 LAB — ECHOCARDIOGRAM COMPLETE
Height: 69.5 in
Weight: 4444.47 oz

## 2017-04-18 MED ORDER — METFORMIN HCL ER 500 MG PO TB24
750.0000 mg | ORAL_TABLET | Freq: Two times a day (BID) | ORAL | Status: DC
  Administered 2017-04-19: 750 mg via ORAL
  Filled 2017-04-18: qty 1.5

## 2017-04-18 MED ORDER — METFORMIN HCL ER 750 MG PO TB24
750.0000 mg | ORAL_TABLET | Freq: Two times a day (BID) | ORAL | Status: DC
  Administered 2017-04-18: 750 mg via ORAL
  Filled 2017-04-18: qty 1
  Filled 2017-04-18: qty 1.5

## 2017-04-18 MED ORDER — ELIQUIS 5 MG PO TABS: 5.0000 mg | ORAL_TABLET | Freq: Two times a day (BID) | ORAL | 1 refills | Status: DC

## 2017-04-18 MED ORDER — GADOBENATE DIMEGLUMINE 529 MG/ML IV SOLN
12.0000 mL | Freq: Once | INTRAVENOUS | Status: AC | PRN
  Administered 2017-04-18: 12 mL via INTRAVENOUS

## 2017-04-18 NOTE — Evaluation (Signed)
Physical Therapy Evaluation and Discharge Patient Details Name: [name] MRN: [id number] DOB: [birth year] Today's Date: 04/18/2017   History of Present Illness  72 y.o. male admitted 1/16 for arm weakness and slurred speech, now presents close to baseline, believed to have had TIA. MRI findings negative for acute stroke, however show ICA stenosis and pituitary mass which will be imaged further. PMH includes: T2DM, HTN, GERD, CAD, Depression, Arthritis, A Fib, B TKA, Cardiac surgery.     Clinical Impression  Patient evaluated by Physical Therapy with no further acute PT needs identified. All education has been completed and the patient has no further questions. PTA, patient independent with all mobility and ADL's. Patient lives with wife who is avail 24/7 and has level entry into home. Upon eval, patient presents at baseline WNL strength BLE. Pt is mod I with all mobility and ambulating the unit safely without AD at this time. Pt and wife state no concerns for safe return home or remaining questions for PT at this time.  See below for any follow-up Physical Therapy or equipment needs. PT is signing off. Thank you for this referral.     Follow Up Recommendations No PT follow up    Equipment Recommendations  None recommended by PT    Recommendations for Other Services       Precautions / Restrictions Precautions Precautions: None Restrictions Weight Bearing Restrictions: No      Mobility  Bed Mobility Overal bed mobility: Modified Independent                Transfers Overall transfer level: Modified independent               General transfer comment: sit<>supine without AD or physical assistance  Ambulation/Gait Ambulation/Gait assistance: Supervision;Modified independent (Device/Increase time) Ambulation Distance (Feet): 400 Feet Assistive device: None Gait Pattern/deviations: WFL(Within Functional Limits)     General Gait Details: WNL gait and veloctiy,  progressed to mod I with safe balance  Stairs            Wheelchair Mobility    Modified Rankin (Stroke Patients Only)       Balance Overall balance assessment: No apparent balance deficits (not formally assessed)                                           Pertinent Vitals/Pain Pain Assessment: No/denies pain    Home Living Family/patient expects to be discharged to:: Private residence Living Arrangements: Spouse/significant other Available Help at Discharge: Family;Available 24 hours/day Type of Home: House Home Access: Level entry     Home Layout: One level Home Equipment: Cane - single point;Shower seat;Grab bars - toilet;Grab bars - tub/shower      Prior Function Level of Independence: Independent         Comments: I with mobility and ADL     Hand Dominance        Extremity/Trunk Assessment   Upper Extremity Assessment Upper Extremity Assessment: Overall WFL for tasks assessed    Lower Extremity Assessment Lower Extremity Assessment: Overall WFL for tasks assessed       Communication   Communication: No difficulties  Cognition Arousal/Alertness: Awake/alert Behavior During Therapy: WFL for tasks assessed/performed Overall Cognitive Status: Within Functional Limits for tasks assessed  General Comments General comments (skin integrity, edema, etc.): Educated patient and family on stroke risk factors and lifestyle modifications. pt and family agreeable.     Exercises     Assessment/Plan    PT Assessment Patent does not need any further PT services  PT Problem List         PT Treatment Interventions      PT Goals (Current goals can be found in the Care Plan section)  Acute Rehab PT Goals Patient Stated Goal: return home  PT Goal Formulation: With patient/family Time For Goal Achievement: 04/25/17 Potential to Achieve Goals: Good    Frequency     Barriers  to discharge        Co-evaluation               AM-PAC PT "6 Clicks" Daily Activity  Outcome Measure Difficulty turning over in bed (including adjusting bedclothes, sheets and blankets)?: None Difficulty moving from lying on back to sitting on the side of the bed? : None Difficulty sitting down on and standing up from a chair with arms (e.g., wheelchair, bedside commode, etc,.)?: None Help needed moving to and from a bed to chair (including a wheelchair)?: None Help needed walking in hospital room?: None Help needed climbing 3-5 steps with a railing? : None 6 Click Score: 24    End of Session Equipment Utilized During Treatment: Gait belt Activity Tolerance: Patient tolerated treatment well Patient left: in chair;with call bell/phone within reach;with family/visitor present Nurse Communication: Mobility status PT Visit Diagnosis: Other symptoms and signs involving the nervous system (R29.898)    Time: 5361-4431 PT Time Calculation (min) (ACUTE ONLY): 22 min   Charges:   PT Evaluation $PT Eval Low Complexity: 1 Low PT Treatments $Gait Training: 8-22 mins   PT G Codes:        Reinaldo Berber, PT, DPT Acute Rehab Services Pager: [phone]    Reinaldo Berber 04/18/2017, 1:02 PM

## 2017-04-18 NOTE — Care Management (Signed)
CM did benefits check for patients Eliquis:   S/W JOSE @ [employer] # [phone] OPT -2   1. ELIQUIS   2.5 MG BID  COVER- YES  CO-PAY- $ 34.00 + DEDUCTIBLE  TIER- 3 DRUG  PRIOR APPROVAL- NO   2. ELIQUIS 5 MG BID  COVER- YES  CO-PAY- $ 34.00  + DEDUCTIBLE  TIER- 3 DRUG  PRIOR APPROVAL - NO   DEDUCTIBLE - NOT MET  $ 365.00

## 2017-04-18 NOTE — Progress Notes (Signed)
VASCULAR LAB PRELIMINARY  PRELIMINARY  PRELIMINARY  PRELIMINARY  Carotid duplex completed.    Preliminary report:  1-39% ICA plaquing.  Vertebral artery flow is antegrade.   Rajanae Mantia, RVT 04/18/2017, 11:47 AM

## 2017-04-18 NOTE — Progress Notes (Signed)
  Echocardiogram 2D Echocardiogram has been performed.  Alaric Gladwin L Androw 04/18/2017, 9:25 AM

## 2017-04-18 NOTE — Telephone Encounter (Signed)
Noted.  Thanks.   I saw the inpatient notes.  I'll await the follow up testing/results/notes.

## 2017-04-18 NOTE — Progress Notes (Signed)
NEUROHOSPITALISTS STROKE TEAM - DAILY PROGRESS NOTE   ADMISSION HISTORY: [name] is an 72 y.o. male presenting with acute onset of right upper extremity weakness and "trouble getting the words out". He was almost back to baseline on arrival to the ED, with residual stuttering quality to his speech but no errors of grammar/syntax and intact comprehension. No facial droop was noted. He was on Eliquis for atrial fibrillation but stopped taking it about 2 weeks ago due to cost issues. He is not on an antiplatelet medication.   LSN: 2:15 PM TOSO: 2:15 PM tPA Given: No: Minimal residual neurological deficit with NIHSS of 1  SUBJECTIVE (INTERVAL HISTORY) Wife is at the bedside. Patient is found sitting in chair NAD. Overall he feels his condition is completely resolved. Voices no new complaints. No new events reported overnight.   OBJECTIVE Lab Results: CBC:  Recent Labs  Lab 04/17/17 1537 04/17/17 1552  WBC 8.8  --   HGB 12.5* 11.6*  HCT 37.2* 34.0*  MCV 91.0  --   PLT 217  --    BMP: Recent Labs  Lab 04/17/17 1537 04/17/17 1552 04/17/17 2200  NA 142 141  --   K 3.9 3.9  --   CL 108 106  --   CO2 22  --   --   GLUCOSE 138* 135*  --   BUN 22* 22*  --   CREATININE 1.20 1.20  --   CALCIUM 9.6  --   --   MG  --   --  1.9  PHOS  --   --  4.6   Liver Function Tests:  Recent Labs  Lab 04/17/17 1537  AST 19  ALT 13*  ALKPHOS 87  BILITOT 1.4*  PROT 7.2  ALBUMIN 3.9   Coagulation Studies:  Recent Labs    04/17/17 1537  APTT 30  INR 1.00   PHYSICAL EXAM Temp:  [97.4 F (36.3 C)-98.2 F (36.8 C)] 97.4 F (36.3 C) (01/17 0440) Pulse Rate:  [42-113] 56 (01/17 0440) Resp:  [14-24] 18 (01/17 0440) BP: (111-144)/(54-98) 125/59 (01/17 0440) SpO2:  [95 %-100 %] 99 % (01/17 0440) Weight:  [126 kg (277 lb 12.5 oz)-126 kg (277 lb 12.8 oz)] 126 kg (277 lb 12.5 oz) (01/16 2200) General - Well nourished, well  developed, in no apparent distress HEENT-  Normocephalic,  Cardiovascular - Regular rate and rhythm  Respiratory - Lungs clear bilaterally. No wheezing. Abdomen - soft and non-tender, BS normal Extremities- no edema or cyanosis Neurologic Examination: Mental Status: Alert, oriented, thought content appropriate.  Speech fluent without errors of grammar or syntax. Comprehension and naming intact. Able to follow all commands without difficulty. Cranial Nerves: II:  Visual fields intact. PERRL.  III,IV, VI: EOMI without nystagmus. No ptosis.  V,VII: No facial droop. Facial temp sensation equal bilaterally  VIII: Hearing intact to voice IX,X: Unable to visualize palate XI: No asymmetry.  XII: midline tongue extension. Motor: Right : Upper extremity   5/5    Left:     Upper extremity   5/5  Lower extremity   5/5     Lower extremity   5/5 Tone and bulk:normal tone throughout; no atrophy noted No pronator drift.  Sensory: Temp and light touch intact proximally x 4. No extinction. Deep Tendon Reflexes: Unremarkable.  Plantars: Right: downgoing   Left: downgoing Cerebellar: No ataxia with FNF bilaterally.  Gait: Not tested. No truncal ataxia  IMAGING: I have personally reviewed the radiological images below  and agree with the radiology interpretations.  Mr Brain Wo Contrast Result Date: 04/17/2017 IMPRESSION: 1. 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 2. 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 Recommend dedicated pituitary protocol brain MRI without and with contrast to completely characterize when feasible. 3. Left superior scalp soft tissue appearance compatible with scarring. Normal underlying skull bone marrow signal. Electronically Signed   By: Genevie Ann M.D.   On:  04/17/2017 17:16   Ct Head Code Stroke Wo Contrast Result Date: 04/17/2017 IMPRESSION: 1. No acute cortically based infarct or acute intracranial hemorrhage. ASPECTS is 10. 2. Abnormal scalp soft tissues over the left anterior convexity with underlying partial erosion of the left frontal bone outer table. Is there history of scalp skin cancer? 3. Possible 10 mm pituitary mass. Attention to this and the lb 2 findings recommended on a follow-up brain MRI without and with contrast (pituitary protocol preferred) when feasible. 4. These results were communicated to Dr. Cheral Marker at 3:59 pmon 1/16/2019by text page via the [facility] messaging system. Electronically Signed   By: Genevie Ann M.D.   On: 04/17/2017 15:59   Echocardiogram:                                              PENDING B/L Carotid U/S:                                                PENDING MRI Pituitary Protocol                                     PENDING     IMPRESSION: [name] is a 72 y.o. male with PMH of AFIB on Eliquis - stopped taking due to costs approximately 2 weeks ago, who presents with acute onset of stuttering speech and right hand weakness.  IV tPA not indicated. MRI reveals no acute findings for stroke  Likely TIA:  Suspected Etiology: cardioembolic, AFIB not taking Eliquis x 2 weeks Resultant Symptoms: Right arm weakness and dysarthria Stroke Risk Factors: diabetes mellitus, hyperlipidemia and hypertension Other Stroke Risk Factors: Advanced age,Obesity, Body mass index is 40.43 kg/m. , CAD, OSA w/CPAP, CHF, AFIB  Outstanding Stroke Work-up Studies:     Echocardiogram:                                                    PENDING B/L Carotid U/S:                                                       PENDING MRI Pituitary Protocol  PENDING  PLAN  04/18/2017: Continue Eliquis/ Statin Frequent neuro checks Telemetry monitoring PT/OT/SLP Consult Case Management /MSW for  medication assistance with Eliquis Ongoing aggressive stroke risk factor management Patient counseled to be compliant with his antithrombotic medications Patient counseled on Lifestyle modifications including, Diet, Exercise, and Stress Follow up with [facility] in 6 weeks  High grade stenosis of distal Right vertebral artery: No need for DAPT Continue Eliquis for now  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 Pituitary protocol brain MRI - order pending Patient states his mother had a history of a large Pituitary adenoma Follow up with Neurosurgery  Outpatient for Pituitary Mass  AFIB, CHRONIC: Continue Eliquis Case Management to assist with monthly coupons  HYPERTENSION: Stable Long term BP goal normotensive. May slowly restart home B/P medications Home Meds: Lisinopril  HYPERLIPIDEMIA:    Component Value Date/Time   CHOL 133 04/18/2017 0329   TRIG 197 (H) 04/18/2017 0329   HDL 26 (L) 04/18/2017 0329   CHOLHDL 5.1 04/18/2017 0329   VLDL 39 04/18/2017 0329   LDLCALC 68 04/18/2017 0329  Home Meds:  Pravachol 20 mg LDL  goal < 70 Continued on  Pravachol 20 mg daily Continue statin at discharge  DIABETES: Lab Results  Component Value Date   HGBA1C 5.9 (H) 04/18/2017  HgbA1c goal < 7.0 Currently on: Novolog Continue CBG monitoring and SSI to maintain glucose 140-180 mg/dl DM education   OBESITY Obesity, Body mass index is 40.43 kg/m. Greater than/equal to 30  Other Active Problems: Active Problems:   Vertebral artery stenosis    Hospital day # 0 VTE prophylaxis: Eliquis Diet : Diet Heart Room service appropriate? Yes; Fluid consistency: Thin   FAMILY UPDATES: family at bedside  TEAM UPDATES: Nita Sells, MD   Prior Home Stroke Medications:  Eliquis (apixaban) daily  Discharge Stroke Meds:  Please discharge patient on Eliquis 5 mg BID and Pravachol 20 mg daily  Disposition: 01-Home or  Self Care Therapy Recs:               PENDING Home Equipment:         PENDING Follow Up:  Follow-up Information    Rosalin Hawking, MD. Schedule an appointment as soon as possible for a visit in 6 week(s).   Specialty:  Neurology Contact information: [address] [place] [place] [address] [phone]          [name], MD -PCP Follow up in 1-2 weeks      Assessment & plan discussed with with attending physician and they are in agreement.    Renie Ora Stroke Neurology Team 04/18/2017 8:07 AM  ATTENDING NOTE: I reviewed above note and agree with the assessment and plan. I have made any additions or clarifications directly to the above note. Pt was seen and examined.   72 year old male with history of A. fib off Eliquis due to financial issue, CAD, CHF, HTN, HLD, OSA on BiPAP, DM admitted for transient right arm weakness, slurred speech, dizziness, and right leg drift.  Currently symptom resolved.  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  Carotid Doppler unremarkable, EF 55-60%.  LDL 68 and A1c 5.9.  Resume Eliquis and pravastatin for stroke prevention.  Patient also need outpatient follow-up with neurosurgery for sellar mass.  Pending MRI pituitary protocol to further characterize the sellar mass. Will follow.  Rosalin Hawking, MD PhD Stroke Neurology 04/18/2017 7:54 PM  To contact Stroke Continuity provider, please refer to [email or web address] After hours, contact General Neurology

## 2017-04-18 NOTE — Progress Notes (Signed)
SLP Cancellation Note  Patient Details Name: [name] MRN: [id number] DOB: 1946-03-01   Cancelled treatment:       Reason Eval/Treat Not Completed: SLP screened, no needs identified, will sign off   Ramon Brant 04/18/2017, 4:32 PM

## 2017-04-18 NOTE — Progress Notes (Signed)
OT Cancellation Note  Patient Details Name: [name] MRN: [id number] DOB: 01/16/1946   Cancelled Treatment:    Reason Eval/Treat Not Completed: OT screened, no needs identified, will sign off  [facility], OT/L  [phone] 04/18/2017 04/18/2017, 4:56 PM

## 2017-04-18 NOTE — Progress Notes (Signed)
CM is attempting to get a tier exception for his Eliquis through his insurance. Forms should fax to CM in the am. This will allow him to obtain the medication without having to pay the deductible.  Patient unable to receive a 30 day free card for the Eliquis. CM verified this with his pharmacy. CM will attempt to reach his cardiology office in the am to see if they can assist with samples until his tier exception comes through.

## 2017-04-18 NOTE — Discharge Summary (Addendum)
Physician Discharge Summary  [name] ZXY:[id number] DOB: 13-Sep-1945 DOA: 04/17/2017 D/c updated and new AVs rx--Ok for d/c 1/18 PCP: [name], MD  Admit date: 04/17/2017 Discharge date: 04/18/2017  Time spent: 35 minutes  Recommendations for Outpatient Follow-up:  1. Cont Elliquis--should this become non-affordable, suggest couamdin lifelong 2. Lipids, Aic 3 mo 3. Follow with pcp 1-2 week and Neuro 6 week 4. Has appt Dr. 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 Amiodarone -currently on acebutolol 200 twice daily magnesium 1.9--need sOP follow up Dr. Lovena Le of Cardiology OSA on BiPAP-stable at this  time Hypertension-permissive htn initially hold lisinopril 2.5 for now, Lasix 40 resumed  On d/c Restless leg syndrome-continue Requip 1 mg Anemia of chronic disease in setting of paraproteinemia BMI >40 HTN-see above discussion Diabetes mellitus type 2 on oral meds-resuming metformin 750 ii/day, Glucotrol  Bipolar-continue Prozac 20 BPH-continue Flomax 0.5 at bedtime Hyperlipidemia-continue Pravachol   Procedures:  Mri  Echo cartoids   Consultations:  Neuro  Discharge Exam: Vitals:   04/18/17 0440 04/18/17 0800  BP: (!) 125/59 (!) 127/58  Pulse: (!) 56 64  Resp: 18   Temp: (!) 97.4 F (36.3 C) 97.8 F (36.6 C)  SpO2: 99% 98%    General: awaek alert in nad no focal defciit Cardiovascular:  s1 s2 no m/r/g Respiratory:  Clear no added sound Power 5/5 noted patient walming comfortably without deficit in halls  Discharge Instructions   Discharge Instructions    Ambulatory referral to Neurology   Complete by:  As directed    An appointment is requested in approximately: 6 weeks Follow up with stroke clinic Cecille Rubin preferred, if not available, then consider Caesar Chestnut, [facility] or Jaynee Eagles whoever is available) at [facility] in about 6-8 weeks. Thanks.   Diet - low sodium heart healthy   Complete by:  As directed    Diet - low sodium heart healthy   Complete by:  As directed    Discharge instructions   Complete by:  As directed    You were diagnosed with a small stroke and you should not stop taking your Eliquis--if you find that you are running out-let one of your doctors know On your MRI theres was a concern for a pituitary adenoma  Discharge instructions   Complete by:  As directed    You will need follow up for your stroke at neurology office You have a small mass that needs non-urgetn follow up--Please have your regular doc refer to your paperwork and he can set-up follow up for this and referral if needed Continue your meds without change specifically your  Eliquis and ensure that you do not run out of it in the future--if this does become and issue inform your Md early so alternative arrangements can be made   Increase activity slowly   Complete by:  As directed    Increase activity slowly   Complete by:  As directed      Allergies as of 04/18/2017      Reactions   Cheese Anaphylaxis   Bacteria in aged cheeses cause Anaphylactic reaction Patient can tolerate cheese that is not aged, such as ricotta, cream cheese and cottage cheese   Verapamil Shortness Of Breath, Other (See Comments)   CP, irregular/slow HR, dizziness, heartburn, drowsiness, weakness   Zithromax [azithromycin Dihydrate] Swelling   Swelling (arms/legs/scotrum)   Oxycodone Itching   Sweats and itching.  Tolerates hydrocodone   Acyclovir And Related    Latex    Latex rast test done on May 17, results are negative   Adhesive [tape] Itching, Rash   Amlodipine Other (See Comments)   myalgias      Medication List    TAKE these medications   ACCU-CHEK FASTCLIX LANCETS Misc Use as instructed to check blood sugar daily.  Diagnosis:  E11.9  Non insulin dependent   ACCU-CHEK GUIDE CONTROL Liqd USE AS DIRECTED   ACCU-CHEK GUIDE w/Device Kit 1 kit by In Vitro route daily.   acebutolol 200 MG capsule Commonly known as:  SECTRAL Take 1 capsule (200 mg total) by mouth 2 (two) times daily.   acetaminophen 325 MG tablet Commonly known as:  TYLENOL Take 650 mg by mouth every 6 (six) hours as needed for mild pain.   cetirizine 10 MG tablet Commonly known as:  ZYRTEC Take 10 mg daily as needed by mouth.   CITRACAL + D PO Take 1 tablet by mouth daily.   ELIQUIS 5 MG Tabs tablet Generic drug:  apixaban Take 5 mg by mouth 2 (two) times daily.   FLUoxetine 20 MG tablet Commonly known as:  PROZAC Take 20 mg by mouth every morning.   fluticasone 50 MCG/ACT nasal spray Commonly known as:  FLONASE Place 2 sprays into both nostrils as needed for rhinitis.   furosemide  40 MG tablet Commonly known as:  LASIX Take 40 mg every morning by mouth.   glipiZIDE 5 MG tablet Commonly known as:  GLUCOTROL Take 1 tablet (5 mg total) by mouth daily before breakfast.   glucose blood test strip Commonly known as:  ACCU-CHEK GUIDE Use to check blood sugar once daily.  Diagnosis:  E11.9   Non insulin dependent.   lisinopril 2.5 MG tablet Commonly known as:  PRINIVIL,ZESTRIL Take 1 tablet (2.5 mg total) by mouth daily. What changed:  when to take this   metFORMIN 750 MG 24 hr tablet Commonly known as:  GLUCOPHAGE-XR Take 1-2 tablets (750-1,500 mg total) by mouth daily with breakfast. What changed:    how much to take  when to take this   omeprazole 20 MG capsule Commonly known as:  PRILOSEC Take 1 capsule (20 mg total) by mouth 2 (two) times daily before a meal. What changed:    when to take  this  reasons to take this   OTEZLA 30 MG Tabs Generic drug:  Apremilast TAKE 1 TABLET BY MOUTH TWICE DAILY   pravastatin 20 MG tablet Commonly known as:  PRAVACHOL Take 1 tablet (20 mg total) by mouth daily. What changed:  when to take this   PRESERVISION AREDS 2 Caps Take 1 capsule by mouth 2 (two) times daily.   rOPINIRole 0.5 MG tablet Commonly known as:  REQUIP TAKE TWO TABLETS BY MOUTH EACH NIGHT AFTER DINNER What changed:    how much to take  how to take this  when to take this  additional instructions   tamsulosin 0.4 MG Caps capsule Commonly known as:  FLOMAX Take 0.4 mg at bedtime by mouth.   Vitamin D3 5000 units Tabs Take 5,000 Units every morning by mouth.      Allergies  Allergen Reactions  . 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 Verapamil Shortness Of Breath and Other (See Comments)    CP, irregular/slow HR, dizziness, heartburn, drowsiness, weakness  . Zithromax [Azithromycin Dihydrate]  Swelling    Swelling (arms/legs/scotrum)  . Oxycodone Itching    Sweats and itching.  Tolerates hydrocodone  . Acyclovir And Related   . Latex     Latex rast test done on May 17, results are negative  . Adhesive [Tape] Itching and Rash  . Amlodipine Other (See Comments)    myalgias   Follow-up Information    Rosalin Hawking, MD. Schedule an appointment as soon as possible for a visit in 6 week(s).   Specialty:  Neurology Contact information: [address] [place] [address] [phone]            The results of significant diagnostics from this hospitalization (including imaging, microbiology, ancillary and laboratory) are listed below for reference.    Significant Diagnostic Studies: Mr Virgel Paling PT Contrast  Result Date: 04/17/2017 CLINICAL DATA:  72 year old male code stroke presentation. Left side weakness, last seen normal 1415 hrs. Head CT today with abnormal left superior scalp and possible small pituitary mass. EXAM: MRI HEAD WITHOUT CONTRAST MRA HEAD WITHOUT CONTRAST TECHNIQUE: Multiplanar, multiecho pulse sequences of the brain and surrounding structures were obtained without intravenous contrast. Angiographic images of the head were obtained using MRA technique without contrast. COMPARISON:  Head CT without contrast 1546 hrs. FINDINGS: MRI HEAD FINDINGS Brain: No restricted diffusion or evidence of acute infarction. Rounded intra sellar soft tissue mass with heterogeneously increased T2 signal and decreased T1 signal encompasses 12 x 13 x 13 millimeters (AP by transverse by CC). This has a rounded superior contour but there is no significant extension into the suprasellar cistern. The infundibulum appears to remain normal. No definite cavernous sinus involvement. No contrast administered. No cortical encephalomalacia or chronic cerebral blood products are identified. Scattered mild for age nonspecific cerebral white matter T2 and FLAIR hyperintensity. More  moderate T2 heterogeneity in the bilateral deep gray matter nuclei, most pronounced in the thalami appears at least in part due to perivascular spaces. Brainstem and cerebellum are within normal limits. No midline shift, mass effect, ventriculomegaly, extra-axial collection or acute intracranial hemorrhage. Cervicomedullary junction within normal limits. Vascular: Diminished distal right vertebral artery flow void at the skull base and in the V4 segment (series 6, image 5). Other Major intracranial vascular flow voids are preserved. See MRA findings below. Skull and upper  cervical spine: Abnormal appearance of the left superior convexity scalp soft tissues as seen on series 11, image 23, corresponding to the CT finding today. The underlying left frontal bone marrow signal remains normal. No abnormal bony or soft tissue diffusion in the region. Bone marrow signal at the skull base is within normal limits. Negative visualized cervical spine. Sinuses/Orbits: Postoperative changes to both globes. Otherwise normal orbits soft tissues. Paranasal sinuses are clear. Other: Trace inferior right mastoid air cell fluid. Visible internal auditory structures appear normal. Negative visible nasopharynx. MRA HEAD FINDINGS Antegrade flow in the distal left vertebral artery without stenosis. There is antegrade flow signal in the distal right vertebral artery, but the vessel is highly diminutive or stenotic distal to the right PICA origin. The right vertebral does not appear non dominant on the MRI images today. Patent basilar artery without stenosis. Normal SCA and PCA origins. Posterior communicating arteries are diminutive or absent. Bilateral PCA branches are within normal limits. Antegrade flow in both ICA siphons. No siphon stenosis. Normal ophthalmic artery origins. Patent carotid termini. Normal MCA and ACA origins. Anterior communicating artery and visible ACA branches are within normal limits. Bilateral MCA M1 segments and  MCA bifurcations are patent and appear normal. Visible bilateral MCA branches are within normal limits. IMPRESSION: 1. 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 2. 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 Recommend dedicated pituitary protocol brain MRI without and with contrast to completely characterize when feasible. 3. Left superior scalp soft tissue appearance compatible with scarring. Normal underlying skull bone marrow signal. Electronically Signed   By: Genevie Ann M.D.   On: 04/17/2017 17:16   Mr Brain Wo Contrast  Result Date: 04/17/2017 CLINICAL DATA:  72 year old male code stroke presentation. Left side weakness, last seen normal 1415 hrs. Head CT today with abnormal left superior scalp and possible small pituitary mass. EXAM: MRI HEAD WITHOUT CONTRAST MRA HEAD WITHOUT CONTRAST TECHNIQUE: Multiplanar, multiecho pulse sequences of the brain and surrounding structures were obtained without intravenous contrast. Angiographic images of the head were obtained using MRA technique without contrast. COMPARISON:  Head CT without contrast 1546 hrs. FINDINGS: MRI HEAD FINDINGS Brain: No restricted diffusion or evidence of acute infarction. Rounded intra sellar soft tissue mass with heterogeneously increased T2 signal and decreased T1 signal encompasses 12 x 13 x 13 millimeters (AP by transverse by CC). This has a rounded superior contour but there is no significant extension into the suprasellar cistern. The infundibulum appears to remain normal. No definite cavernous sinus involvement. No contrast administered. No cortical encephalomalacia or chronic cerebral blood products are identified. Scattered mild for age nonspecific cerebral white matter T2 and FLAIR hyperintensity. More moderate T2  heterogeneity in the bilateral deep gray matter nuclei, most pronounced in the thalami appears at least in part due to perivascular spaces. Brainstem and cerebellum are within normal limits. No midline shift, mass effect, ventriculomegaly, extra-axial collection or acute intracranial hemorrhage. Cervicomedullary junction within normal limits. Vascular: Diminished distal right vertebral artery flow void at the skull base and in the V4 segment (series 6, image 5). Other Major intracranial vascular flow voids are preserved. See MRA findings below. Skull and upper cervical spine: Abnormal appearance of the left superior convexity scalp soft tissues as seen on series 11, image 23, corresponding to the CT finding today. The underlying left frontal bone  marrow signal remains normal. No abnormal bony or soft tissue diffusion in the region. Bone marrow signal at the skull base is within normal limits. Negative visualized cervical spine. Sinuses/Orbits: Postoperative changes to both globes. Otherwise normal orbits soft tissues. Paranasal sinuses are clear. Other: Trace inferior right mastoid air cell fluid. Visible internal auditory structures appear normal. Negative visible nasopharynx. MRA HEAD FINDINGS Antegrade flow in the distal left vertebral artery without stenosis. There is antegrade flow signal in the distal right vertebral artery, but the vessel is highly diminutive or stenotic distal to the right PICA origin. The right vertebral does not appear non dominant on the MRI images today. Patent basilar artery without stenosis. Normal SCA and PCA origins. Posterior communicating arteries are diminutive or absent. Bilateral PCA branches are within normal limits. Antegrade flow in both ICA siphons. No siphon stenosis. Normal ophthalmic artery origins. Patent carotid termini. Normal MCA and ACA origins. Anterior communicating artery and visible ACA branches are within normal limits. Bilateral MCA M1 segments and MCA  bifurcations are patent and appear normal. Visible bilateral MCA branches are within normal limits. IMPRESSION: 1. 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 2. 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 Recommend dedicated pituitary protocol brain MRI without and with contrast to completely characterize when feasible. 3. Left superior scalp soft tissue appearance compatible with scarring. Normal underlying skull bone marrow signal. Electronically Signed   By: Genevie Ann M.D.   On: 04/17/2017 17:16   Ct Head Code Stroke Wo Contrast  Result Date: 04/17/2017 CLINICAL DATA:  Code stroke. 72 year old male with left side weakness. Last seen normal 1415 hrs. EXAM: CT HEAD WITHOUT CONTRAST TECHNIQUE: Contiguous axial images were obtained from the base of the skull through the vertex without intravenous contrast. COMPARISON:  Cervical spine CT 04/03/2003. FINDINGS: Brain: Cerebral volume is within normal limits for age. No midline shift, ventriculomegaly, mass effect, intracranial hemorrhage or evidence of cortically based acute infarction. Gray-white matter differentiation is within normal limits throughout the brain. No encephalomalacia identified. There is mildly increased soft tissue within the sella turcica, estimated about 10 millimeters diameter (coronal image 35). No suprasellar mass effect. Vascular: Mild Calcified atherosclerosis at the skull base. No suspicious intracranial vascular hyperdensity. Skull: Mild cortical irregularity along the left superior scalp convexity (series 4, image 67). See below regarding overlying scalp soft tissue abnormality. Elsewhere bone mineralization appears normal and the calvarium appears intact. Sinuses/Orbits: Clear. Other: Left superior and anterior scalp  convexity soft tissue deficiency with soft tissue thickening (series 4, image 67). The soft tissue abnormality extends to the outer table of the skull, but there is no mass like soft tissue identified. Elsewhere scalp, orbit, and visible face soft tissues appear within normal limits. ASPECTS [facility] Stroke Program Early CT Score) - Ganglionic level infarction (caudate, lentiform nuclei, internal capsule, insula, M1-M3 cortex): 7 - Supraganglionic infarction (M4-M6 cortex): 3 Total score (0-10 with 10 being normal): 10 IMPRESSION: 1. No acute cortically based infarct or acute intracranial hemorrhage. ASPECTS is 10. 2. Abnormal scalp soft tissues over the left anterior convexity with underlying partial erosion of the left frontal bone outer table. Is there history of scalp skin cancer? 3. Possible 10 mm pituitary mass. Attention to this and the lb 2 findings recommended on a follow-up brain MRI without and with contrast (pituitary protocol preferred) when feasible. 4.  These results were communicated to Dr. Cheral Marker at 3:59 pmon 1/16/2019by text page via the [facility] messaging system. Electronically Signed   By: Genevie Ann M.D.   On: 04/17/2017 15:59    Microbiology: No results found for this or any previous visit (from the past 240 hour(s)).   Labs: Basic Metabolic Panel: Recent Labs  Lab 04/17/17 1537 04/17/17 1552 04/17/17 2200  NA 142 141  --   K 3.9 3.9  --   CL 108 106  --   CO2 22  --   --   GLUCOSE 138* 135*  --   BUN 22* 22*  --   CREATININE 1.20 1.20  --   CALCIUM 9.6  --   --   MG  --   --  1.9  PHOS  --   --  4.6   Liver Function Tests: Recent Labs  Lab 04/17/17 1537  AST 19  ALT 13*  ALKPHOS 87  BILITOT 1.4*  PROT 7.2  ALBUMIN 3.9   No results for input(s): LIPASE, AMYLASE in the last 168 hours. No results for input(s): AMMONIA in the last 168 hours. CBC: Recent Labs  Lab 04/17/17 1537 04/17/17 1552  WBC 8.8  --   NEUTROABS 6.2  --   HGB 12.5* 11.6*  HCT 37.2* 34.0*   MCV 91.0  --   PLT 217  --    Cardiac Enzymes: No results for input(s): CKTOTAL, CKMB, CKMBINDEX, TROPONINI in the last 168 hours. BNP: BNP (last 3 results) No results for input(s): BNP in the last 8760 hours.  ProBNP (last 3 results) No results for input(s): PROBNP in the last 8760 hours.  CBG: Recent Labs  Lab 04/17/17 1540 04/18/17 0603 04/18/17 1213  GLUCAP 126* 113* 138*       Signed:  Nita Sells MD   [facility] 04/18/2017, 2:37 PM

## 2017-04-19 ENCOUNTER — Telehealth: Payer: Self-pay | Admitting: Internal Medicine

## 2017-04-19 DIAGNOSIS — I6501 Occlusion and stenosis of right vertebral artery: Secondary | ICD-10-CM | POA: Diagnosis not present

## 2017-04-19 DIAGNOSIS — G459 Transient cerebral ischemic attack, unspecified: Secondary | ICD-10-CM | POA: Diagnosis not present

## 2017-04-19 LAB — GLUCOSE, CAPILLARY
Glucose-Capillary: 129 mg/dL — ABNORMAL HIGH (ref 65–99)
Glucose-Capillary: 139 mg/dL — ABNORMAL HIGH (ref 65–99)

## 2017-04-19 NOTE — Telephone Encounter (Signed)
New message   Patient calling the office for samples of medication:   1.  What medication and dosage are you requesting samples for? apixaban (ELIQUIS) tablet 5 mg  2.  Are you currently out of this medication? Yes  Claiborne Billings at [facility] health case management is requesting a 30 day supply for patient please call

## 2017-04-19 NOTE — Progress Notes (Signed)
Pt discharged home with self care. Pt having trouble affording his Eliquis. CM spoke with Dr Forde Dandy nurse and they will provide samples for the patient for at least the first 3 weeks. After that hopefully the Tier exception will be in effect. CM receive the paper work and will fax it to [facility] to get the process started.  Pts wife providing transportation home.

## 2017-04-19 NOTE — Progress Notes (Signed)
NEUROHOSPITALISTS STROKE TEAM - DAILY PROGRESS NOTE   SUBJECTIVE (INTERVAL HISTORY) Wife is at the bedside. Patient is found sitting in chair NAD. Overall he feels his condition is completely resolved. Voices no new complaints. MRI over night confirmed pituitary macroadenoma, needs outpt NSG follow up.      OBJECTIVE Lab Results: CBC:  Recent Labs  Lab 04/17/17 1537 04/17/17 1552  WBC 8.8  --   HGB 12.5* 11.6*  HCT 37.2* 34.0*  MCV 91.0  --   PLT 217  --    BMP: Recent Labs  Lab 04/17/17 1537 04/17/17 1552 04/17/17 2200  NA 142 141  --   K 3.9 3.9  --   CL 108 106  --   CO2 22  --   --   GLUCOSE 138* 135*  --   BUN 22* 22*  --   CREATININE 1.20 1.20  --   CALCIUM 9.6  --   --   MG  --   --  1.9  PHOS  --   --  4.6   Liver Function Tests:  Recent Labs  Lab 04/17/17 1537  AST 19  ALT 13*  ALKPHOS 87  BILITOT 1.4*  PROT 7.2  ALBUMIN 3.9   Coagulation Studies:  Recent Labs    04/17/17 1537  APTT 30  INR 1.00    PHYSICAL EXAM Temp:  [97.7 F (36.5 C)-98.6 F (37 C)] 98 F (36.7 C) (01/18 0948) Pulse Rate:  [55-88] 88 (01/18 0948) Resp:  [18-20] 18 (01/18 0948) BP: (117-144)/(56-85) 117/56 (01/18 0948) SpO2:  [94 %-99 %] 94 % (01/18 0948) General - Well nourished, well developed, in no apparent distress HEENT-  Normocephalic,  Cardiovascular - Regular rate and rhythm  Respiratory - Lungs clear bilaterally. No wheezing. Abdomen - soft and non-tender, BS normal Extremities- no edema or cyanosis Neurologic Examination: Mental Status: Alert, oriented, thought content appropriate.  Speech fluent without errors of grammar or syntax. Comprehension and naming intact. Able to follow all commands without difficulty. Cranial Nerves: II:  Visual fields intact. PERRL.  III,IV, VI: EOMI without nystagmus. No ptosis.  V,VII: No facial droop. Facial temp sensation equal bilaterally  VIII: Hearing intact to  voice IX,X: Unable to visualize palate XI: No asymmetry.  XII: midline tongue extension. Motor: Right : Upper extremity   5/5    Left:     Upper extremity   5/5  Lower extremity   5/5     Lower extremity   5/5 Tone and bulk:normal tone throughout; no atrophy noted No pronator drift.  Sensory: Temp and light touch intact proximally x 4. No extinction. Deep Tendon Reflexes: Unremarkable.  Plantars: Right: downgoing   Left: downgoing Cerebellar: No ataxia with FNF bilaterally.  Gait: Not tested. No truncal ataxia  IMAGING:  I have personally reviewed the radiological images below and agree with the radiology interpretations.   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  No mass effect on suprasellar structures. Mass extends toward cavernous sinuses above cavernous internal carotid arteries between medial and median carotid lines, (Knospgrade 1).  No invasion of sphenoid sinus or skull base.   Mr Brain Wo Contrast Result Date: 04/17/2017 IMPRESSION:  1. 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  2. 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 Top differential considerations include pituitary macro  adenoma and pituitary cyst. Recommend dedicated pituitary protocol brain MRI without and with contrast to completely characterize when feasible.  3. Left superior scalp soft tissue appearance compatible with scarring. Normal underlying skull bone marrow signal.    Ct Head Code Stroke Wo Contrast Result Date: 04/17/2017 IMPRESSION:  1. No acute cortically based infarct or acute intracranial hemorrhage. ASPECTS is 10.  2. Abnormal scalp soft tissues over the left anterior convexity  with underlying partial erosion of the left frontal bone outer table. Is there history of  scalp skin cancer?  3. Possible 10 mm pituitary mass.     Echocardiogram  04/18/2017 Study Conclusions - Left ventricle: The cavity size was normal. There was moderate   concentric hypertrophy. Systolic function was normal. The   estimated ejection fraction was in the range of 55% to 60%. Wall   motion was normal; there were no regional wall motion   abnormalities. Doppler parameters are consistent with restrictive   physiology, indicative of decreased left ventricular diastolic   compliance and/or increased left atrial pressure. Doppler   parameters are consistent with elevated ventricular end-diastolic   filling pressure. - Aortic valve: Trileaflet; moderately thickened, moderately   calcified leaflets. There was mild stenosis. There was mild   regurgitation. Mean gradient (S): 13 mm Hg. Valve area (VTI):   1.78 cm^2. Valve area (Vmax): 1.63 cm^2. Valve area (Vmean): 2.05   cm^2. - Mitral valve: Calcified annulus. - Left atrium: The atrium was severely dilated. - Right ventricle: Systolic function was normal. - Right atrium: The atrium was mildly dilated. - Tricuspid valve: There was mild regurgitation. - Pulmonic valve: There was trivial regurgitation. - Pulmonary arteries: Systolic pressure was mildly increased. PA   peak pressure: 39 mm Hg (S). Impressions: - No cardiac source of emboli was indentified.                                           B/L Carotid U/S:     04/18/2017 Preliminary report:  1-39% ICA plaquing.  Vertebral artery flow is antegrade.                                      IMPRESSION: [name] is a 72 y.o. male with PMH of diabetes mellitus, posttraumatic stress disorder, psoriasis, paroxysmal ventricular tachycardia, obstructive sleep apnea with bipap, hyperlipidemia, hypertension, congestive heart failure, coronary artery disease, depression, anxiety, AFIB on Eliquis - stopped taking due to costs approximately 2 weeks ago, who presents with  acute onset of stuttering speech and right hand weakness.  IV tPA not indicated. MRI reveals no acute findings for stroke  Likely TIA:  Suspected Etiology: cardioembolic, AFIB not taking Eliquis x 2 weeks Resultant Symptoms: Right arm weakness and dysarthria Stroke Risk Factors: diabetes mellitus, hyperlipidemia and hypertension Other Stroke Risk Factors: Advanced age,Obesity, Body mass index is 40.43 kg/m. 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  Patient states his mother had a history of a large Pituitary adenoma Follow up with Neurosurgery  Outpatient   AFIB, CHRONIC: Continue Eliquis Case Management to assist with monthly coupons  HYPERTENSION: Stable Long term BP goal normotensive. May slowly restart home B/P medications Home Meds: Lisinopril  HYPERLIPIDEMIA:    Component Value Date/Time   CHOL 133 04/18/2017 0329   TRIG 197 (H) 04/18/2017 0329   HDL 26 (L) 04/18/2017 0329   CHOLHDL 5.1 04/18/2017 0329   VLDL 39 04/18/2017 0329   LDLCALC 68 04/18/2017 0329  Home Meds:  Pravachol 20 mg LDL  goal < 70 Continued on  Pravachol 20 mg daily Continue statin at discharge  DIABETES: Lab Results  Component Value Date   HGBA1C 5.9 (H) 04/18/2017  HgbA1c goal < 7.0 Currently on: Novolog Continue CBG monitoring and SSI to maintain glucose 140-180 mg/dl DM education    OBESITY Obesity, Body mass index is 40.43 kg/m. 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 Yes; Fluid consistency: Thin Diet - low sodium heart healthy Diet - low sodium heart healthy   FAMILY UPDATES: family at bedside  TEAM UPDATES: Nita Sells, MD   Prior Home Stroke Medications:  Eliquis (apixaban) daily  Discharge Stroke Meds:  Please discharge patient on Eliquis 5 mg BID and Pravachol 20 mg daily  Disposition: 01-Home or Self Care Therapy Recs:               No follow-up PT recommended - OT evaluation pending Home Equipment:         PENDING Follow Up:  Follow-up Information    Rosalin Hawking, MD. Schedule an appointment as soon as possible for a visit in 6 week(s).   Specialty:  Neurology Contact information: [address] [place] [id number] [phone]        Newman Pies, MD. Go on 04/30/2017.   Specialty:  Neurosurgery Why:  14:30 app, go at 14:00 Contact information: [address] [place] [address] [phone]          [name], MD -PCP Follow up in 1-2 weeks      Assessment & plan discussed with with attending physician and they are in agreement.     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    ATTENDING NOTE: I reviewed above note and agree with the assessment and plan. I have made any additions or clarifications directly to the above note. Pt was seen and examined.   72 year old male with history of A. fib off Eliquis due to financial issue, CAD, CHF, HTN, HLD, OSA on BiPAP, DM admitted for transient right arm weakness, slurred speech, dizziness, and right leg drift.  Currently symptom resolved.  MRI negative result acute stroke, MRI showed chronic right M2 stenosis and 13 mm of sellar  mass, likely pituitary macro adenoma.  Carotid Doppler unremarkable, EF 55-60%.  LDL 68 and A1c 5.9.  Resume Eliquis and pravastatin for stroke prevention. Patient also need outpatient follow-up with neurosurgery for sellar mass.   Neurology will sign off. Please call with questions. Pt will follow up with Cecille Rubin, NP, at [facility] in about 6 weeks. Thanks for the consult.  Rosalin Hawking, MD PhD Stroke Neurology 04/19/2017 6:39 PM   To contact Stroke Continuity provider, please refer to [email or web address] After hours, contact General Neurology

## 2017-04-19 NOTE — Progress Notes (Signed)
Discharge orders received.  Discharge instructions and follow-up appointments reviewed with the patient.  VSS upon discharge.  IV removed and education complete.  All belongings sent with the patient.  Transported out via wheelchair. Keydi Giel M, RN   

## 2017-04-19 NOTE — Telephone Encounter (Signed)
Spoke with Claiborne Billings and she informed me that the patient has a high deductible and she is going to try to get a tier reduction for the eliquis. I made her aware that I would place some samples at the front desk for the patient. He will come by the office today to pick them up.

## 2017-04-19 NOTE — Progress Notes (Signed)
Seen examiend no changes-given NS follow up DR. Jenkins-ok to d/c home

## 2017-04-24 ENCOUNTER — Encounter: Payer: Self-pay | Admitting: Internal Medicine

## 2017-04-25 NOTE — Care Management (Signed)
04/25/2017 1414: CM had not heard back from patients medication insurance about the Tier exception. CM called [employer] and they have denied the Tier exception. CM notified [name]. They were aware and trying to get assistance through the [employer]. CM encouraged them to f/u with Dr Forde Dandy office for further assistance.

## 2017-04-29 ENCOUNTER — Telehealth: Payer: Self-pay

## 2017-04-29 ENCOUNTER — Other Ambulatory Visit: Payer: Self-pay

## 2017-04-29 MED ORDER — ELIQUIS 5 MG PO TABS: 5.0000 mg | ORAL_TABLET | Freq: Two times a day (BID) | ORAL | 3 refills | Status: AC

## 2017-04-29 NOTE — Telephone Encounter (Signed)
The pt dropped of the provider part of his [employer] pt assistance application for Eliquis. He is requesting that Dr Lovena Le sign it and a Eliquis RX. I have completed the providers part of the application, printed a Eliquis RX and have placed both in Dr Forde Dandy mail bin awaiting his signature.  He also is requesting that I call him once Dr Lovena Le signs the application and the RX so he can mail in with his forms/documents in a packet together for quicker review.

## 2017-04-30 DIAGNOSIS — D352 Benign neoplasm of pituitary gland: Secondary | ICD-10-CM | POA: Diagnosis not present

## 2017-04-30 NOTE — Telephone Encounter (Signed)
**Note De-Identified  Obfuscation** The pt is advised that Dr Lovena Le has signed his [facility] pt assistance application and the RX for Eliquis. He states that he is in [place] and will pick up today. I have placed the letter in the front office.

## 2017-05-03 DIAGNOSIS — N200 Calculus of kidney: Secondary | ICD-10-CM | POA: Diagnosis not present

## 2017-05-08 ENCOUNTER — Encounter: Payer: Self-pay | Admitting: Internal Medicine

## 2017-05-08 ENCOUNTER — Other Ambulatory Visit: Payer: Self-pay

## 2017-05-08 MED ORDER — FUROSEMIDE 40 MG PO TABS: 40.0000 mg | ORAL_TABLET | Freq: Every morning | ORAL | 3 refills | Status: DC

## 2017-05-11 ENCOUNTER — Other Ambulatory Visit: Payer: Self-pay | Admitting: Family Medicine

## 2017-05-11 DIAGNOSIS — D352 Benign neoplasm of pituitary gland: Secondary | ICD-10-CM | POA: Insufficient documentation

## 2017-05-11 NOTE — Progress Notes (Signed)
Notes reviewed.  Other MDs had rec'd endo eval.  I put in the referral.  Thanks.   Left detailed message on voicemail. 05/13/2017  L. [place], CMA

## 2017-05-15 NOTE — Telephone Encounter (Signed)
**Note De-Identified  Obfuscation** Pt assistance for Eliquis approved per fax from [employer]. Approval good until 04/01/18.

## 2017-05-26 ENCOUNTER — Other Ambulatory Visit: Payer: Self-pay | Admitting: Family Medicine

## 2017-05-26 DIAGNOSIS — E119 Type 2 diabetes mellitus without complications: Secondary | ICD-10-CM

## 2017-05-27 ENCOUNTER — Other Ambulatory Visit (INDEPENDENT_AMBULATORY_CARE_PROVIDER_SITE_OTHER): Payer: Medicare Other

## 2017-05-27 DIAGNOSIS — E119 Type 2 diabetes mellitus without complications: Secondary | ICD-10-CM | POA: Diagnosis not present

## 2017-05-27 LAB — CBC WITH DIFFERENTIAL/PLATELET
BASOS PCT: 0.6 % (ref 0.0–3.0)
Basophils Absolute: 0.1 10*3/uL (ref 0.0–0.1)
EOS PCT: 3.2 % (ref 0.0–5.0)
Eosinophils Absolute: 0.3 10*3/uL (ref 0.0–0.7)
HCT: 35.3 % — ABNORMAL LOW (ref 39.0–52.0)
HEMOGLOBIN: 12.4 g/dL — AB (ref 13.0–17.0)
LYMPHS ABS: 1.8 10*3/uL (ref 0.7–4.0)
Lymphocytes Relative: 18.3 % (ref 12.0–46.0)
MCHC: 34.9 g/dL (ref 30.0–36.0)
MCV: 90 fl (ref 78.0–100.0)
MONO ABS: 0.6 10*3/uL (ref 0.1–1.0)
Monocytes Relative: 6.4 % (ref 3.0–12.0)
NEUTROS ABS: 7.1 10*3/uL (ref 1.4–7.7)
Neutrophils Relative %: 71.5 % (ref 43.0–77.0)
PLATELETS: 244 10*3/uL (ref 150.0–400.0)
RBC: 3.93 Mil/uL — ABNORMAL LOW (ref 4.22–5.81)
RDW: 14.5 % (ref 11.5–15.5)
WBC: 10 10*3/uL (ref 4.0–10.5)

## 2017-05-27 LAB — HEMOGLOBIN A1C: Hgb A1c MFr Bld: 6 % (ref 4.6–6.5)

## 2017-05-27 LAB — COMPREHENSIVE METABOLIC PANEL
ALT: 11 U/L (ref 0–53)
AST: 12 U/L (ref 0–37)
Albumin: 3.9 g/dL (ref 3.5–5.2)
Alkaline Phosphatase: 86 U/L (ref 39–117)
BUN: 24 mg/dL — ABNORMAL HIGH (ref 6–23)
CHLORIDE: 106 meq/L (ref 96–112)
CO2: 24 meq/L (ref 19–32)
Calcium: 9.7 mg/dL (ref 8.4–10.5)
Creatinine, Ser: 0.93 mg/dL (ref 0.40–1.50)
GFR: 85.17 mL/min (ref >60.00)
GLUCOSE: 162 mg/dL — AB (ref 70–99)
POTASSIUM: 4.2 meq/L (ref 3.5–5.1)
SODIUM: 139 meq/L (ref 135–145)
Total Bilirubin: 1 mg/dL (ref 0.2–1.2)
Total Protein: 7.3 g/dL (ref 6.0–8.3)

## 2017-05-27 LAB — LIPID PANEL
Cholesterol: 114 mg/dL (ref 0–200)
HDL: 27.1 mg/dL — ABNORMAL LOW (ref >39.00)
LDL CALC: 66 mg/dL (ref 0–99)
NONHDL: 87.05
Total CHOL/HDL Ratio: 4
Triglycerides: 106 mg/dL (ref 0.0–149.0)
VLDL: 21.2 mg/dL (ref 0.0–40.0)

## 2017-05-31 IMAGING — CT CT ABD-PELV W/O CM
2 of 3 series · 17 of 40 positions shown, 19 images · non-contrast
Comparison: CT dated 10/11/2014

CLINICAL DATA: 69-year-old male with right flank pain. History of
kidney stones.

EXAM:
CT ABDOMEN AND PELVIS WITHOUT CONTRAST
TECHNIQUE: Multidetector CT imaging of the abdomen and pelvis was performed
following the standard protocol without IV contrast.

[Series 3: coronal · coronal · 0.88mm/px · 3 of 127 slices shown]
[im 43/127  soft-tissue]
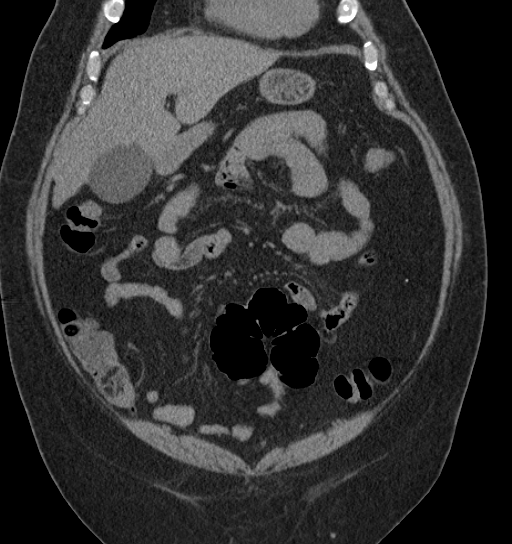
[im 57/127  soft-tissue]
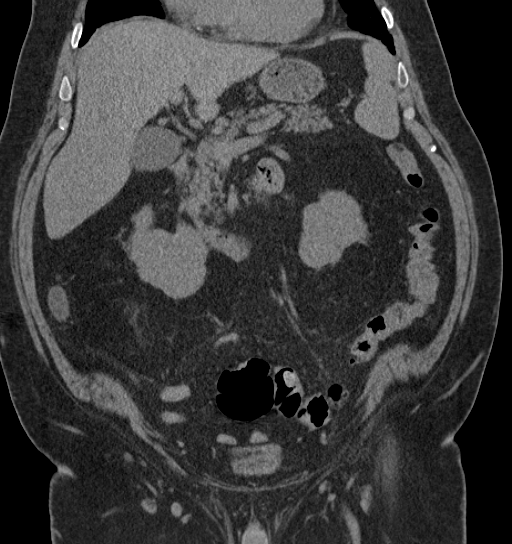
[im 71/127  soft-tissue]
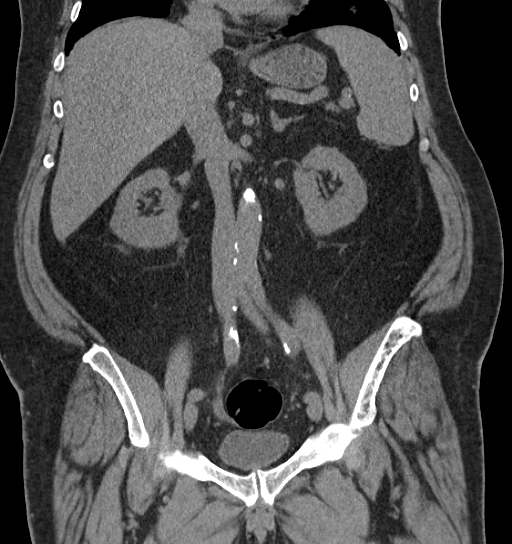

[Series 6: lung · axial · 0.97mm/px · z∈[+1395,+1510]mm · 14 of 26 slices shown, 16 images]
[im 2/26  soft-tissue]
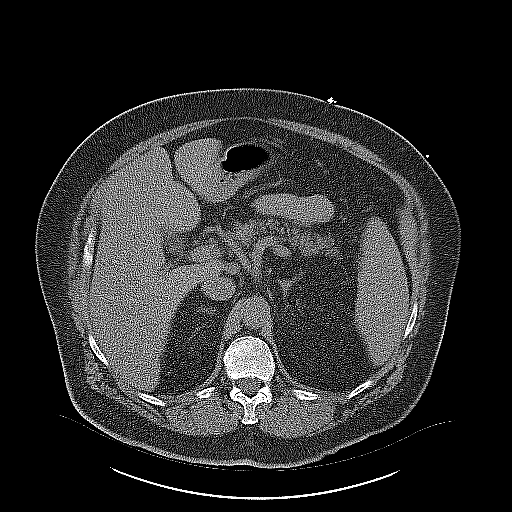
[im 2/26  bone]
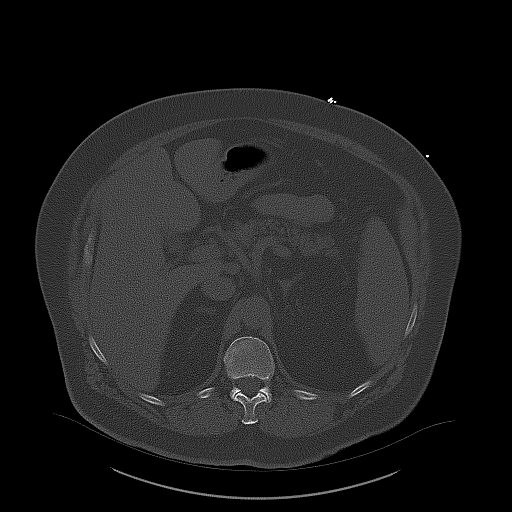
[im 4/26  soft-tissue]
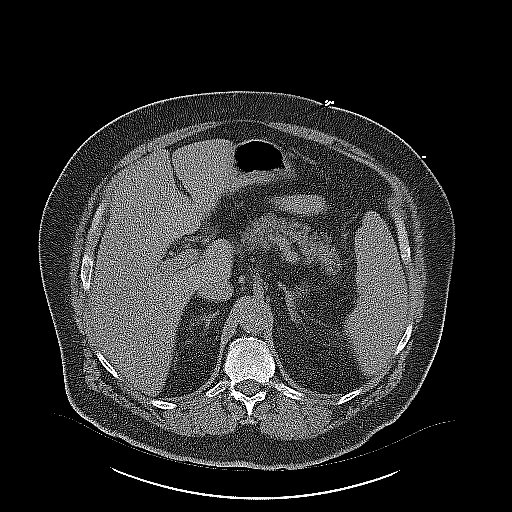
[im 6/26  soft-tissue]
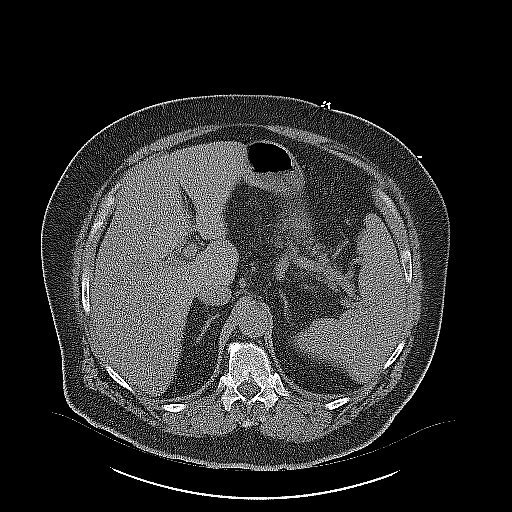
[im 8/26  soft-tissue]
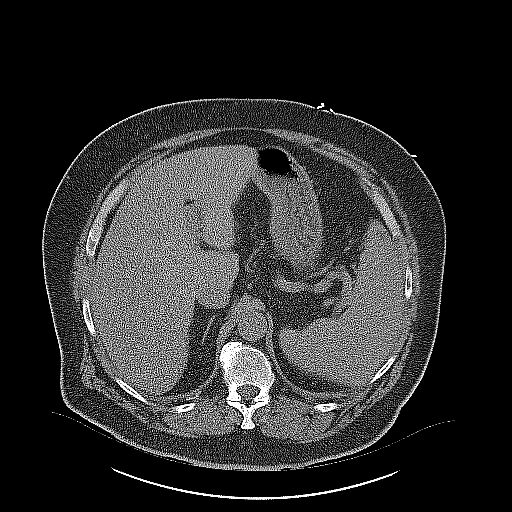
[im 9/26  soft-tissue]
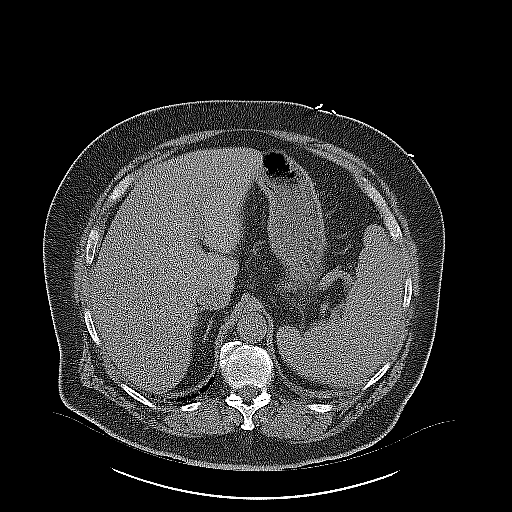
[im 11/26  soft-tissue]
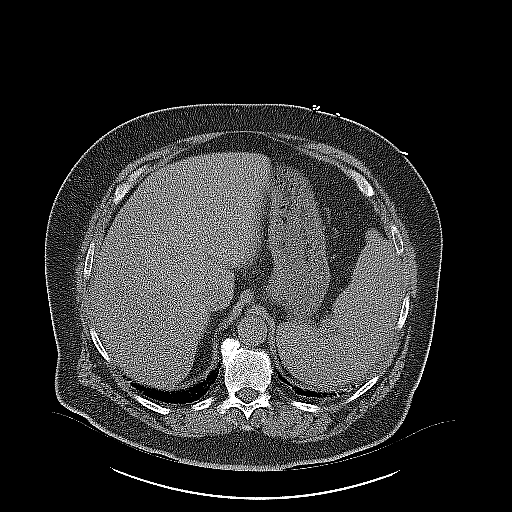
[im 13/26  soft-tissue]
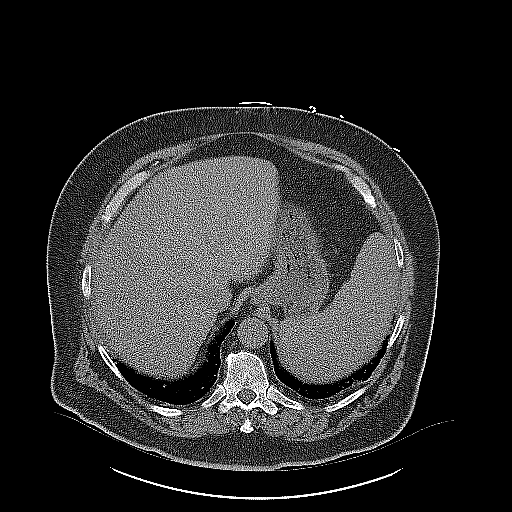
[im 14/26  soft-tissue]
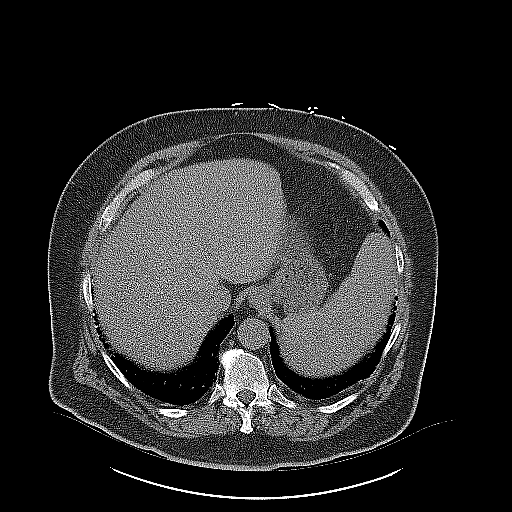
[im 16/26  soft-tissue]
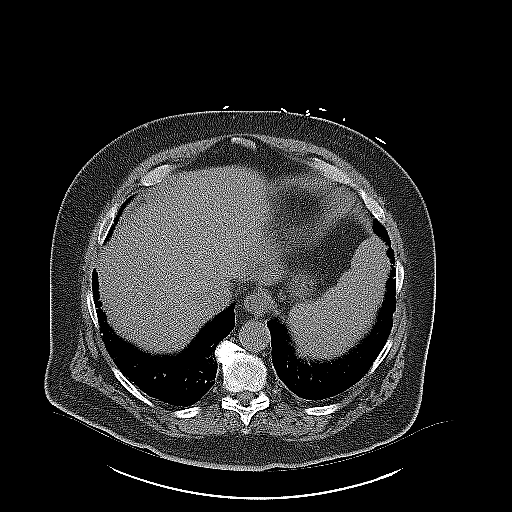
[im 16/26  bone]
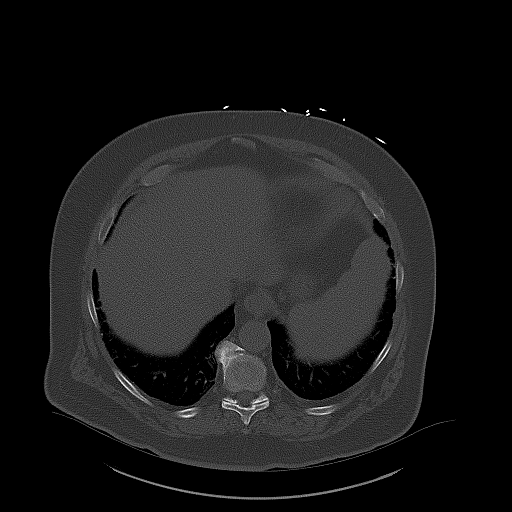
[im 18/26  soft-tissue]
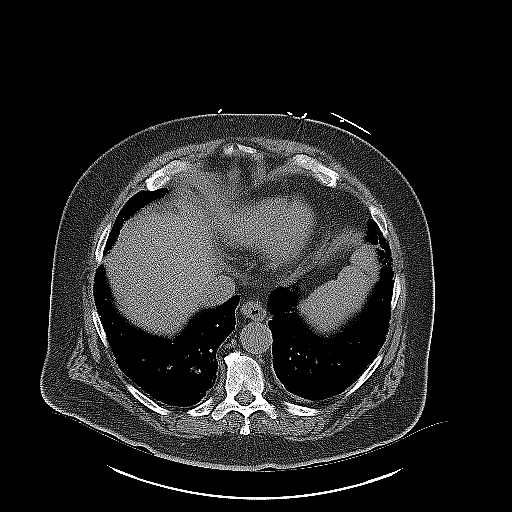
[im 20/26  soft-tissue]
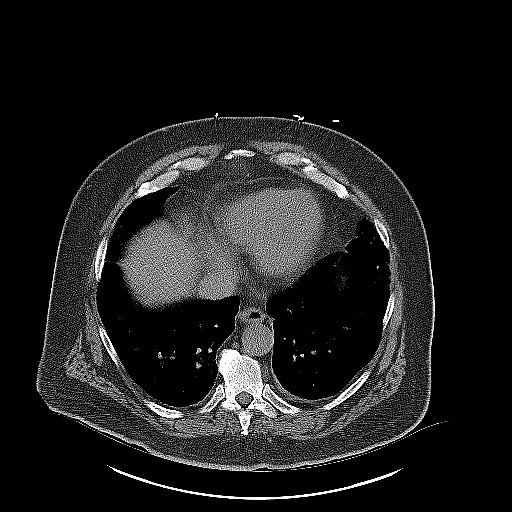
[im 21/26  soft-tissue]
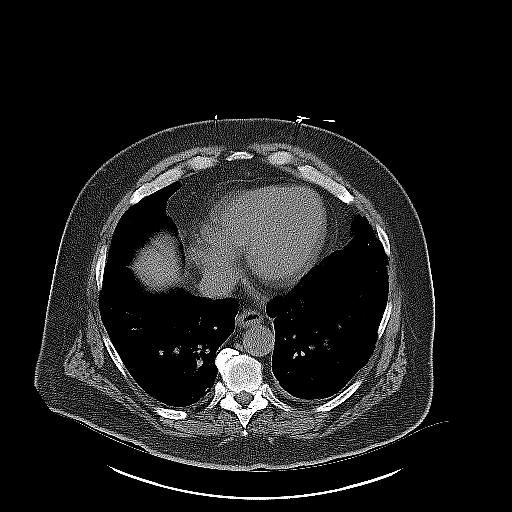
[im 23/26  soft-tissue]
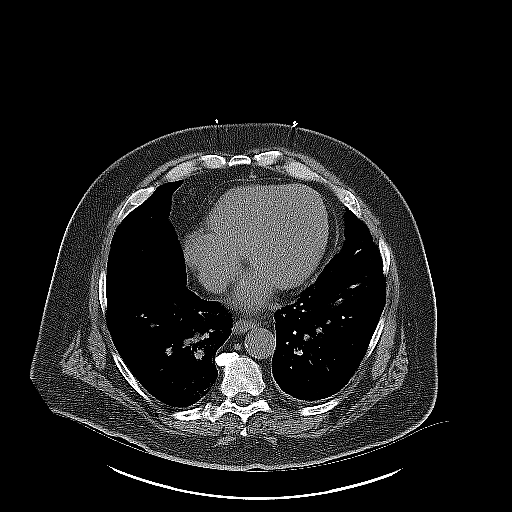
[im 25/26  soft-tissue]
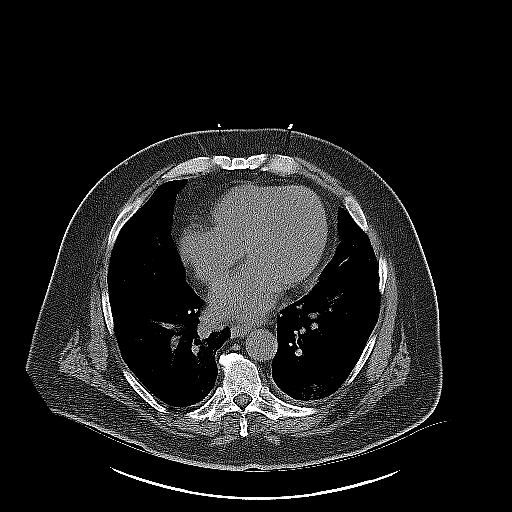

[17 of 40 positions shown; findings below may reference images not displayed]

FINDINGS: Evaluation of this exam is limited in the absence of intravenous
contrast.

Partially visualized patchy area of ground-glass opacity at the left
lung base posteriorly, compatible with atelectasis versus pneumonia.
No intra-abdominal free air or free fluid.

Mild fatty infiltration of the liver. The gallbladder, pancreas,
spleen, adrenal glands appear unremarkable. A subcentimeter left
renal cortical exophytic lesion is not characterized. There is no
hydronephrosis or nephrolithiasis on the left. There appears to be
three adjacent stones measuring up to 5 mm and a combined length of
1.2 cm at the right UPJ. There is mild pelviectasis of the right
kidney, similar to prior study. The urinary bladder is predominantly
collapsed. The prostate and seminal vesicles are grossly
unremarkable. There is scattered sigmoid diverticula without acute
inflammatory changes. There is no evidence of bowel obstruction or
inflammation. Normal appendix.

There is aortoiliac atherosclerotic disease. There is no
lymphadenopathy. There are sclerotic changes of the left iliac bone
compatible with Paget's disease. There is degenerative changes of
the spine.
IMPRESSION: Right UPJ calculi with mild right renal pelviectasis, stable from
prior study.

## 2017-06-03 ENCOUNTER — Ambulatory Visit (INDEPENDENT_AMBULATORY_CARE_PROVIDER_SITE_OTHER): Payer: Medicare Other | Admitting: Family Medicine

## 2017-06-03 ENCOUNTER — Encounter: Payer: Self-pay | Admitting: Family Medicine

## 2017-06-03 VITALS — BP 108/64 | HR 59 | Temp 97.7°F | Wt 280.0 lb

## 2017-06-03 DIAGNOSIS — L989 Disorder of the skin and subcutaneous tissue, unspecified: Secondary | ICD-10-CM

## 2017-06-03 DIAGNOSIS — I6501 Occlusion and stenosis of right vertebral artery: Secondary | ICD-10-CM

## 2017-06-03 DIAGNOSIS — I480 Paroxysmal atrial fibrillation: Secondary | ICD-10-CM | POA: Diagnosis not present

## 2017-06-03 DIAGNOSIS — I10 Essential (primary) hypertension: Secondary | ICD-10-CM | POA: Diagnosis not present

## 2017-06-03 DIAGNOSIS — D352 Benign neoplasm of pituitary gland: Secondary | ICD-10-CM | POA: Diagnosis not present

## 2017-06-03 MED ORDER — ROPINIROLE HCL 0.5 MG PO TABS: 1.0000 mg | ORAL_TABLET | Freq: Every day | ORAL | Status: DC

## 2017-06-03 MED ORDER — METFORMIN HCL ER 750 MG PO TB24: 750.0000 mg | ORAL_TABLET | Freq: Two times a day (BID) | ORAL | Status: DC

## 2017-06-03 MED ORDER — PRAVASTATIN SODIUM 20 MG PO TABS: 20.0000 mg | ORAL_TABLET | Freq: Every evening | ORAL | Status: DC

## 2017-06-03 MED ORDER — GLIPIZIDE 5 MG PO TABS: 5.0000 mg | ORAL_TABLET | Freq: Every day | ORAL | Status: DC

## 2017-06-03 MED ORDER — LISINOPRIL 2.5 MG PO TABS: 2.5000 mg | ORAL_TABLET | Freq: Every evening | ORAL | Status: DC

## 2017-06-03 NOTE — Patient Instructions (Addendum)
If you have any neurologic symptoms or vision changes, then seek eval.  You labs look good.   Recheck in about 3 months, A1c prior to the visit.  The rib pain should resolve.  [name] will call about your referral. Take care.  Glad to see you.

## 2017-06-03 NOTE — Progress Notes (Addendum)
Admitted with TIA.  Back on anticoagulation now.  No bleeding.  D/w pt about secondary prevention, ie risk factor modifications.  RUE weakness prev, <24 hours of sx, with no residual now.  A1c and LDL at goal.    Pituitary mass noted incidentally with neurosurgery and endocrine f/u pending.  He has no vision changes.  D/w pt about routine cautions.    Black spot on R pinna.  Wife had noted.  He wanted evaluation.  He has some R sided torso pain, along the R lower ribs.  Better today than a few days ago.  No trauma, no trigger.  No bruising.  No rash.    PMH and SH reviewed  Meds, vitals, and allergies reviewed.   ROS: Per HPI unless specifically indicated in ROS section   GEN: nad, alert and oriented HEENT: mucous membranes moist NECK: supple w/o LA CV: IRR, not tachy PULM: ctab, no inc wob ABD: soft, +bs EXT: no edema SKIN: no acute rash CN 2-12 wnl B, S/S wnl x4, diminished but symmetric DTRs.   R pinna with small macule, dark, with variable pigmentation.   R lower ribs ttp but abd not ttp (this appears to be limited to the chest wall along the right lower ribs and should resolve, discussed with patient about observation)  Diabetic foot exam: Normal inspection No skin breakdown No calluses  Normal DP pulses Normal sensation to light touch and monofilament Nails normal except for L 1st nail absent.

## 2017-06-04 NOTE — Assessment & Plan Note (Signed)
Sounds to be in A. fib at office visit, not tachy.  He had episode of likely TIA when he was off anticoagulation.  Discussed with pt re: risk factor modification, with continued anticoagulation, control of lipids, control of diabetes, etc.  Labs discussed with patient.  Continue current medications.  No residual neurologic deficit. >25 minutes spent in face to face time with patient, >50% spent in counselling or coordination of care, discussing anatomy and pathophysiology of pituitary lesion, TIA pathophysiology and prevention, etc.

## 2017-06-04 NOTE — Assessment & Plan Note (Signed)
He has follow-up with neurosurgery and endocrine pending.  He has no vision changes.  Routine cautions discussed with patient and anatomy discussed with patient.  I will await notes from outside clinics.

## 2017-06-04 NOTE — Assessment & Plan Note (Signed)
This is small but it has variable pigmentation and given the location I would feel better if patient had dermatology evaluation.  He agrees.  Refer.

## 2017-06-07 ENCOUNTER — Encounter: Payer: Self-pay | Admitting: Adult Health

## 2017-06-07 ENCOUNTER — Ambulatory Visit (INDEPENDENT_AMBULATORY_CARE_PROVIDER_SITE_OTHER): Payer: Medicare Other | Admitting: Adult Health

## 2017-06-07 VITALS — BP 133/68 | HR 56 | Ht 69.0 in | Wt 284.0 lb

## 2017-06-07 DIAGNOSIS — I6501 Occlusion and stenosis of right vertebral artery: Secondary | ICD-10-CM

## 2017-06-07 DIAGNOSIS — G459 Transient cerebral ischemic attack, unspecified: Secondary | ICD-10-CM | POA: Diagnosis not present

## 2017-06-07 DIAGNOSIS — E785 Hyperlipidemia, unspecified: Secondary | ICD-10-CM | POA: Diagnosis not present

## 2017-06-07 DIAGNOSIS — I480 Paroxysmal atrial fibrillation: Secondary | ICD-10-CM

## 2017-06-07 DIAGNOSIS — I10 Essential (primary) hypertension: Secondary | ICD-10-CM | POA: Diagnosis not present
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
## 2017-06-07 NOTE — Patient Instructions (Signed)
Continue Eliquis (apixaban) daily  and Pravastatin  for secondary stroke prevention  Continue to follow up with PCP for cholesterol, diabetes, and blood pressure management  Continue to follow up with cardiologist regarding atrial fibrillation and Eliquis   Maintain strict control of hypertension with blood pressure goal below 130/90, diabetes with hemoglobin A1c goal below 6.5% and cholesterol with LDL cholesterol (bad cholesterol) goal below 70 mg/dL. I also advised the patient to eat a healthy diet with plenty of whole grains, cereals, fruits and vegetables, exercise regularly and maintain ideal body weight.  Followup in the future with me in 6 months

## 2017-06-10 ENCOUNTER — Other Ambulatory Visit: Payer: Self-pay | Admitting: Pulmonary Disease

## 2017-06-11 ENCOUNTER — Other Ambulatory Visit: Payer: Self-pay

## 2017-06-11 DIAGNOSIS — I10 Essential (primary) hypertension: Secondary | ICD-10-CM

## 2017-06-12 NOTE — Telephone Encounter (Signed)
Cox, Candice A, CMA  Willeen Cass A, RN 21 hours ago (2:49 PM)    Ok to refill Lisinopril 2.5 mg daily? Pt last few refills came from his PCP. (Routing comment)         Pt just saw his PCP on 06/03/17, and he mentioned lisinopril in his note. Looks like they refilled it, put hit the no print option. Please refer refill to the pts PCP, Dr. [name].   Thanks

## 2017-06-12 NOTE — Progress Notes (Signed)
I reviewed note and agree with plan.   Pituitary lesion to be followed up by neurosurgery and endocrinology.   Penni Bombard, MD  Certified in Neurology, Neurophysiology and Neuroimaging  [facility] [address] [place], [place] [address] [phone]

## 2017-06-18 ENCOUNTER — Other Ambulatory Visit: Payer: Self-pay | Admitting: Pulmonary Disease

## 2017-06-18 ENCOUNTER — Other Ambulatory Visit: Payer: Self-pay | Admitting: *Deleted

## 2017-06-18 DIAGNOSIS — I10 Essential (primary) hypertension: Secondary | ICD-10-CM

## 2017-06-18 NOTE — Telephone Encounter (Signed)
Electronic refill request.  Last office visit:   06/03/2017 Last Filled:   Lisinopril 06/03/17 No Print Last Filled:   Ropinrole 06/03/17 No Print Please advise.

## 2017-06-19 ENCOUNTER — Ambulatory Visit: Payer: Medicare Other | Admitting: Pulmonary Disease

## 2017-06-19 MED ORDER — ROPINIROLE HCL 0.5 MG PO TABS: 1.0000 mg | ORAL_TABLET | Freq: Every day | ORAL | 3 refills | Status: DC

## 2017-06-19 MED ORDER — LISINOPRIL 2.5 MG PO TABS: 2.5000 mg | ORAL_TABLET | Freq: Every evening | ORAL | 3 refills | Status: DC

## 2017-06-19 NOTE — Telephone Encounter (Signed)
Sent. Thanks.   

## 2017-06-25 ENCOUNTER — Encounter: Payer: Self-pay | Admitting: Endocrinology

## 2017-06-25 ENCOUNTER — Ambulatory Visit (INDEPENDENT_AMBULATORY_CARE_PROVIDER_SITE_OTHER): Payer: Medicare Other | Admitting: Endocrinology

## 2017-06-25 VITALS — BP 120/68 | HR 59 | Wt 288.6 lb

## 2017-06-25 DIAGNOSIS — I6501 Occlusion and stenosis of right vertebral artery: Secondary | ICD-10-CM

## 2017-06-25 DIAGNOSIS — D352 Benign neoplasm of pituitary gland: Secondary | ICD-10-CM | POA: Diagnosis not present
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
## 2017-06-25 NOTE — Patient Instructions (Addendum)
Please sign a release of information from the neurosurgery office.  Please do a 24-HR urine. I will call you when I have reviewed those results. Please cal in 1 week, if we have not called by then.

## 2017-06-26 ENCOUNTER — Telehealth: Payer: Self-pay | Admitting: Pulmonary Disease

## 2017-06-26 MED ORDER — ROPINIROLE HCL 0.5 MG PO TABS: 1.0000 mg | ORAL_TABLET | Freq: Every day | ORAL | 0 refills | Status: DC

## 2017-06-26 NOTE — Telephone Encounter (Signed)
Spoke with pt. He is needing a refill on Ropinirole. Rx has been sent in to last until his appointment with RA in May. Nothing further was needed.

## 2017-07-02 ENCOUNTER — Other Ambulatory Visit: Payer: Medicare Other

## 2017-07-02 DIAGNOSIS — D352 Benign neoplasm of pituitary gland: Secondary | ICD-10-CM | POA: Diagnosis not present

## 2017-07-08 LAB — CORTISOL, URINE, 24 HOUR
24 HOUR URINE VOLUME (VMAHVA): 2550 mL
Cortisol (Ur), Free: 12.1 mcg/24 h (ref 4.0–50.0)
RESULTS RECEIVED: 1.65 g/(24.h) (ref 0.50–2.15)

## 2017-07-12 ENCOUNTER — Other Ambulatory Visit: Payer: Self-pay | Admitting: Endocrinology

## 2017-07-12 DIAGNOSIS — D352 Benign neoplasm of pituitary gland: Secondary | ICD-10-CM

## 2017-07-16 DIAGNOSIS — L4059 Other psoriatic arthropathy: Secondary | ICD-10-CM | POA: Diagnosis not present

## 2017-07-16 DIAGNOSIS — L821 Other seborrheic keratosis: Secondary | ICD-10-CM | POA: Diagnosis not present

## 2017-07-16 DIAGNOSIS — L281 Prurigo nodularis: Secondary | ICD-10-CM | POA: Diagnosis not present

## 2017-07-30 ENCOUNTER — Emergency Department (HOSPITAL_COMMUNITY): Payer: Medicare Other

## 2017-07-30 ENCOUNTER — Encounter (HOSPITAL_COMMUNITY): Payer: Self-pay | Admitting: Emergency Medicine

## 2017-07-30 ENCOUNTER — Inpatient Hospital Stay (HOSPITAL_COMMUNITY)
Admission: EM | Admit: 2017-07-30 | Discharge: 2017-08-03 | DRG: 291 | Disposition: A | Discharge status: Home / Self Care | Payer: Medicare Other | Attending: Family Medicine | Admitting: Family Medicine

## 2017-07-30 ENCOUNTER — Other Ambulatory Visit: Payer: Self-pay

## 2017-07-30 DIAGNOSIS — Z96653 Presence of artificial knee joint, bilateral: Secondary | ICD-10-CM | POA: Diagnosis present

## 2017-07-30 DIAGNOSIS — J9601 Acute respiratory failure with hypoxia: Secondary | ICD-10-CM | POA: Diagnosis present

## 2017-07-30 DIAGNOSIS — Z66 Do not resuscitate: Secondary | ICD-10-CM | POA: Diagnosis present

## 2017-07-30 DIAGNOSIS — Z7901 Long term (current) use of anticoagulants: Secondary | ICD-10-CM

## 2017-07-30 DIAGNOSIS — G4733 Obstructive sleep apnea (adult) (pediatric): Secondary | ICD-10-CM | POA: Diagnosis present

## 2017-07-30 DIAGNOSIS — Z91048 Other nonmedicinal substance allergy status: Secondary | ICD-10-CM

## 2017-07-30 DIAGNOSIS — M19072 Primary osteoarthritis, left ankle and foot: Secondary | ICD-10-CM | POA: Diagnosis present

## 2017-07-30 DIAGNOSIS — Z91018 Allergy to other foods: Secondary | ICD-10-CM

## 2017-07-30 DIAGNOSIS — E785 Hyperlipidemia, unspecified: Secondary | ICD-10-CM | POA: Diagnosis present

## 2017-07-30 DIAGNOSIS — Z8249 Family history of ischemic heart disease and other diseases of the circulatory system: Secondary | ICD-10-CM

## 2017-07-30 DIAGNOSIS — R05 Cough: Secondary | ICD-10-CM | POA: Diagnosis not present

## 2017-07-30 DIAGNOSIS — G2581 Restless legs syndrome: Secondary | ICD-10-CM | POA: Diagnosis present

## 2017-07-30 DIAGNOSIS — J309 Allergic rhinitis, unspecified: Secondary | ICD-10-CM | POA: Diagnosis present

## 2017-07-30 DIAGNOSIS — R509 Fever, unspecified: Secondary | ICD-10-CM | POA: Diagnosis not present

## 2017-07-30 DIAGNOSIS — M19032 Primary osteoarthritis, left wrist: Secondary | ICD-10-CM | POA: Diagnosis present

## 2017-07-30 DIAGNOSIS — F4310 Post-traumatic stress disorder, unspecified: Secondary | ICD-10-CM | POA: Diagnosis present

## 2017-07-30 DIAGNOSIS — I480 Paroxysmal atrial fibrillation: Secondary | ICD-10-CM | POA: Diagnosis present

## 2017-07-30 DIAGNOSIS — J849 Interstitial pulmonary disease, unspecified: Secondary | ICD-10-CM | POA: Diagnosis present

## 2017-07-30 DIAGNOSIS — I481 Persistent atrial fibrillation: Secondary | ICD-10-CM | POA: Diagnosis present

## 2017-07-30 DIAGNOSIS — I2510 Atherosclerotic heart disease of native coronary artery without angina pectoris: Secondary | ICD-10-CM | POA: Diagnosis present

## 2017-07-30 DIAGNOSIS — E662 Morbid (severe) obesity with alveolar hypoventilation: Secondary | ICD-10-CM | POA: Diagnosis not present

## 2017-07-30 DIAGNOSIS — Z7984 Long term (current) use of oral hypoglycemic drugs: Secondary | ICD-10-CM

## 2017-07-30 DIAGNOSIS — Z6841 Body Mass Index (BMI) 40.0 and over, adult: Secondary | ICD-10-CM | POA: Diagnosis not present

## 2017-07-30 DIAGNOSIS — M19031 Primary osteoarthritis, right wrist: Secondary | ICD-10-CM | POA: Diagnosis present

## 2017-07-30 DIAGNOSIS — I110 Hypertensive heart disease with heart failure: Principal | ICD-10-CM | POA: Diagnosis present

## 2017-07-30 DIAGNOSIS — N400 Enlarged prostate without lower urinary tract symptoms: Secondary | ICD-10-CM | POA: Diagnosis present

## 2017-07-30 DIAGNOSIS — I10 Essential (primary) hypertension: Secondary | ICD-10-CM | POA: Diagnosis present

## 2017-07-30 DIAGNOSIS — E119 Type 2 diabetes mellitus without complications: Secondary | ICD-10-CM

## 2017-07-30 DIAGNOSIS — Z888 Allergy status to other drugs, medicaments and biological substances status: Secondary | ICD-10-CM

## 2017-07-30 DIAGNOSIS — I5033 Acute on chronic diastolic (congestive) heart failure: Secondary | ICD-10-CM

## 2017-07-30 DIAGNOSIS — K219 Gastro-esophageal reflux disease without esophagitis: Secondary | ICD-10-CM | POA: Diagnosis present

## 2017-07-30 DIAGNOSIS — Z885 Allergy status to narcotic agent status: Secondary | ICD-10-CM

## 2017-07-30 DIAGNOSIS — Z881 Allergy status to other antibiotic agents status: Secondary | ICD-10-CM

## 2017-07-30 DIAGNOSIS — Z825 Family history of asthma and other chronic lower respiratory diseases: Secondary | ICD-10-CM

## 2017-07-30 DIAGNOSIS — D352 Benign neoplasm of pituitary gland: Secondary | ICD-10-CM | POA: Diagnosis present

## 2017-07-30 DIAGNOSIS — Z87891 Personal history of nicotine dependence: Secondary | ICD-10-CM

## 2017-07-30 DIAGNOSIS — D649 Anemia, unspecified: Secondary | ICD-10-CM

## 2017-07-30 DIAGNOSIS — Z91040 Latex allergy status: Secondary | ICD-10-CM

## 2017-07-30 DIAGNOSIS — L4050 Arthropathic psoriasis, unspecified: Secondary | ICD-10-CM | POA: Diagnosis present

## 2017-07-30 DIAGNOSIS — R0602 Shortness of breath: Secondary | ICD-10-CM | POA: Diagnosis not present

## 2017-07-30 DIAGNOSIS — J984 Other disorders of lung: Secondary | ICD-10-CM

## 2017-07-30 DIAGNOSIS — Z87442 Personal history of urinary calculi: Secondary | ICD-10-CM

## 2017-07-30 DIAGNOSIS — Z7951 Long term (current) use of inhaled steroids: Secondary | ICD-10-CM

## 2017-07-30 DIAGNOSIS — M19071 Primary osteoarthritis, right ankle and foot: Secondary | ICD-10-CM | POA: Diagnosis present

## 2017-07-30 DIAGNOSIS — I5032 Chronic diastolic (congestive) heart failure: Secondary | ICD-10-CM | POA: Diagnosis present

## 2017-07-30 DIAGNOSIS — I509 Heart failure, unspecified: Secondary | ICD-10-CM | POA: Diagnosis not present

## 2017-07-30 LAB — BASIC METABOLIC PANEL
Anion gap: 10 (ref 5–15)
BUN: 17 mg/dL (ref 6–20)
CHLORIDE: 101 mmol/L (ref 101–111)
CO2: 24 mmol/L (ref 22–32)
CREATININE: 1.1 mg/dL (ref 0.61–1.24)
Calcium: 9 mg/dL (ref 8.9–10.3)
GFR calc Af Amer: >60 mL/min (ref >60)
GFR calc non Af Amer: >60 mL/min (ref >60)
Glucose, Bld: 176 mg/dL — ABNORMAL HIGH (ref 65–99)
Potassium: 3.9 mmol/L (ref 3.5–5.1)
SODIUM: 135 mmol/L (ref 135–145)

## 2017-07-30 LAB — BRAIN NATRIURETIC PEPTIDE: B Natriuretic Peptide: 151.3 pg/mL — ABNORMAL HIGH (ref 0.0–100.0)

## 2017-07-30 LAB — CBC
HEMATOCRIT: 36.4 % — AB (ref 39.0–52.0)
Hemoglobin: 12.1 g/dL — ABNORMAL LOW (ref 13.0–17.0)
MCH: 30.3 pg (ref 26.0–34.0)
MCHC: 33.2 g/dL (ref 30.0–36.0)
MCV: 91.2 fL (ref 78.0–100.0)
PLATELETS: 199 10*3/uL (ref 150–400)
RBC: 3.99 MIL/uL — ABNORMAL LOW (ref 4.22–5.81)
RDW: 14 % (ref 11.5–15.5)
WBC: 8.1 10*3/uL (ref 4.0–10.5)

## 2017-07-30 LAB — I-STAT TROPONIN, ED: Troponin i, poc: 0.02 ng/mL (ref 0.00–0.08)
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
## 2017-07-30 NOTE — ED Triage Notes (Signed)
Pt presents with increased shortness of breath and weight gain since Sunday. Hx CHF, pt states he took 2 doses of lasix on Sunday. C/o some chest pressure and cough.

## 2017-07-31 ENCOUNTER — Encounter (HOSPITAL_COMMUNITY): Payer: Self-pay | Admitting: Internal Medicine

## 2017-07-31 ENCOUNTER — Other Ambulatory Visit: Payer: Self-pay

## 2017-07-31 DIAGNOSIS — I2510 Atherosclerotic heart disease of native coronary artery without angina pectoris: Secondary | ICD-10-CM | POA: Diagnosis present

## 2017-07-31 DIAGNOSIS — M19031 Primary osteoarthritis, right wrist: Secondary | ICD-10-CM | POA: Diagnosis present

## 2017-07-31 DIAGNOSIS — J309 Allergic rhinitis, unspecified: Secondary | ICD-10-CM | POA: Diagnosis present

## 2017-07-31 DIAGNOSIS — G4733 Obstructive sleep apnea (adult) (pediatric): Secondary | ICD-10-CM | POA: Diagnosis not present

## 2017-07-31 DIAGNOSIS — I5033 Acute on chronic diastolic (congestive) heart failure: Secondary | ICD-10-CM

## 2017-07-31 DIAGNOSIS — L4050 Arthropathic psoriasis, unspecified: Secondary | ICD-10-CM | POA: Diagnosis present

## 2017-07-31 DIAGNOSIS — I110 Hypertensive heart disease with heart failure: Secondary | ICD-10-CM | POA: Diagnosis present

## 2017-07-31 DIAGNOSIS — I481 Persistent atrial fibrillation: Secondary | ICD-10-CM | POA: Diagnosis present

## 2017-07-31 DIAGNOSIS — J849 Interstitial pulmonary disease, unspecified: Secondary | ICD-10-CM | POA: Diagnosis present

## 2017-07-31 DIAGNOSIS — E662 Morbid (severe) obesity with alveolar hypoventilation: Secondary | ICD-10-CM | POA: Diagnosis present

## 2017-07-31 DIAGNOSIS — I480 Paroxysmal atrial fibrillation: Secondary | ICD-10-CM | POA: Diagnosis present

## 2017-07-31 DIAGNOSIS — Z6841 Body Mass Index (BMI) 40.0 and over, adult: Secondary | ICD-10-CM | POA: Diagnosis not present

## 2017-07-31 DIAGNOSIS — J9601 Acute respiratory failure with hypoxia: Secondary | ICD-10-CM | POA: Diagnosis present

## 2017-07-31 DIAGNOSIS — E785 Hyperlipidemia, unspecified: Secondary | ICD-10-CM | POA: Diagnosis present

## 2017-07-31 DIAGNOSIS — Z87442 Personal history of urinary calculi: Secondary | ICD-10-CM | POA: Diagnosis not present

## 2017-07-31 DIAGNOSIS — R509 Fever, unspecified: Secondary | ICD-10-CM

## 2017-07-31 DIAGNOSIS — G2581 Restless legs syndrome: Secondary | ICD-10-CM | POA: Diagnosis present

## 2017-07-31 DIAGNOSIS — Z7901 Long term (current) use of anticoagulants: Secondary | ICD-10-CM | POA: Diagnosis not present

## 2017-07-31 DIAGNOSIS — M19072 Primary osteoarthritis, left ankle and foot: Secondary | ICD-10-CM | POA: Diagnosis present

## 2017-07-31 DIAGNOSIS — K219 Gastro-esophageal reflux disease without esophagitis: Secondary | ICD-10-CM | POA: Diagnosis present

## 2017-07-31 DIAGNOSIS — R0602 Shortness of breath: Secondary | ICD-10-CM | POA: Diagnosis not present

## 2017-07-31 DIAGNOSIS — E119 Type 2 diabetes mellitus without complications: Secondary | ICD-10-CM | POA: Diagnosis present

## 2017-07-31 DIAGNOSIS — M19032 Primary osteoarthritis, left wrist: Secondary | ICD-10-CM | POA: Diagnosis present

## 2017-07-31 DIAGNOSIS — F4310 Post-traumatic stress disorder, unspecified: Secondary | ICD-10-CM | POA: Diagnosis present

## 2017-07-31 DIAGNOSIS — Z96653 Presence of artificial knee joint, bilateral: Secondary | ICD-10-CM | POA: Diagnosis present

## 2017-07-31 DIAGNOSIS — Z7984 Long term (current) use of oral hypoglycemic drugs: Secondary | ICD-10-CM | POA: Diagnosis not present

## 2017-07-31 DIAGNOSIS — M19071 Primary osteoarthritis, right ankle and foot: Secondary | ICD-10-CM | POA: Diagnosis present

## 2017-07-31 LAB — LACTIC ACID, PLASMA
Lactic Acid, Venous: 1.4 mmol/L (ref 0.5–1.9)
Lactic Acid, Venous: 1.1 mmol/L (ref 0.5–1.9)

## 2017-07-31 LAB — TSH: TSH: 2.372 u[IU]/mL (ref 0.350–4.500)

## 2017-07-31 LAB — COMPREHENSIVE METABOLIC PANEL
ALK PHOS: 91 U/L (ref 38–126)
ALT: 12 U/L — AB (ref 17–63)
AST: 21 U/L (ref 15–41)
Albumin: 4 g/dL (ref 3.5–5.0)
Anion gap: 18 — ABNORMAL HIGH (ref 5–15)
BUN: 14 mg/dL (ref 6–20)
CALCIUM: 9.2 mg/dL (ref 8.9–10.3)
CO2: 21 mmol/L — AB (ref 22–32)
CREATININE: 1.01 mg/dL (ref 0.61–1.24)
Chloride: 96 mmol/L — ABNORMAL LOW (ref 101–111)
GFR calc non Af Amer: >60 mL/min (ref >60)
Glucose, Bld: 174 mg/dL — ABNORMAL HIGH (ref 65–99)
Potassium: 3.5 mmol/L (ref 3.5–5.1)
Sodium: 135 mmol/L (ref 135–145)
Total Bilirubin: 1.9 mg/dL — ABNORMAL HIGH (ref 0.3–1.2)
Total Protein: 8.6 g/dL — ABNORMAL HIGH (ref 6.5–8.1)

## 2017-07-31 LAB — I-STAT CG4 LACTIC ACID, ED
LACTIC ACID, VENOUS: 1.04 mmol/L (ref 0.5–1.9)
Lactic Acid, Venous: 2.58 mmol/L (ref 0.5–1.9)

## 2017-07-31 LAB — GLUCOSE, CAPILLARY
Glucose-Capillary: 162 mg/dL — ABNORMAL HIGH (ref 65–99)
Glucose-Capillary: 112 mg/dL — ABNORMAL HIGH (ref 65–99)

## 2017-07-31 LAB — CBC WITH DIFFERENTIAL/PLATELET
BASOS PCT: 0 %
Basophils Absolute: 0 10*3/uL (ref 0.0–0.1)
EOS ABS: 0.1 10*3/uL (ref 0.0–0.7)
EOS PCT: 1 %
HCT: 37.1 % — ABNORMAL LOW (ref 39.0–52.0)
HEMOGLOBIN: 12.4 g/dL — AB (ref 13.0–17.0)
LYMPHS ABS: 0.8 10*3/uL (ref 0.7–4.0)
Lymphocytes Relative: 7 %
MCH: 30.3 pg (ref 26.0–34.0)
MCHC: 33.4 g/dL (ref 30.0–36.0)
MCV: 90.7 fL (ref 78.0–100.0)
Monocytes Absolute: 0.4 10*3/uL (ref 0.1–1.0)
Monocytes Relative: 4 %
NEUTROS PCT: 88 %
Neutro Abs: 9.3 10*3/uL — ABNORMAL HIGH (ref 1.7–7.7)
PLATELETS: 207 10*3/uL (ref 150–400)
RBC: 4.09 MIL/uL — ABNORMAL LOW (ref 4.22–5.81)
RDW: 14 % (ref 11.5–15.5)
WBC: 10.6 10*3/uL — ABNORMAL HIGH (ref 4.0–10.5)

## 2017-07-31 LAB — CBG MONITORING, ED
Glucose-Capillary: 166 mg/dL — ABNORMAL HIGH (ref 65–99)
Glucose-Capillary: 131 mg/dL — ABNORMAL HIGH (ref 65–99)

## 2017-07-31 LAB — DIFFERENTIAL
Basophils Absolute: 0 10*3/uL (ref 0.0–0.1)
Basophils Relative: 0 %
EOS PCT: 2 %
Eosinophils Absolute: 0.1 10*3/uL (ref 0.0–0.7)
LYMPHS ABS: 1 10*3/uL (ref 0.7–4.0)
LYMPHS PCT: 11 %
MONO ABS: 0.4 10*3/uL (ref 0.1–1.0)
MONOS PCT: 4 %
Neutro Abs: 7.5 10*3/uL (ref 1.7–7.7)
Neutrophils Relative %: 83 %

## 2017-07-31 LAB — INFLUENZA PANEL BY PCR (TYPE A & B)
Influenza A By PCR: NEGATIVE
Influenza B By PCR: NEGATIVE

## 2017-07-31 LAB — TROPONIN I
TROPONIN I: 0.03 ng/mL — AB (ref <0.03)
Troponin I: 0.04 ng/mL (ref <0.03)

## 2017-07-31 MED ORDER — APIXABAN 5 MG PO TABS
5.0000 mg | ORAL_TABLET | Freq: Two times a day (BID) | ORAL | Status: DC
  Administered 2017-07-31 – 2017-08-03 (×7): 5 mg via ORAL
  Filled 2017-07-31 (×8): qty 1

## 2017-07-31 MED ORDER — ONDANSETRON HCL 4 MG/2ML IJ SOLN
4.0000 mg | Freq: Once | INTRAMUSCULAR | Status: AC
  Administered 2017-07-31: 4 mg via INTRAVENOUS
  Filled 2017-07-31: qty 2

## 2017-07-31 MED ORDER — FUROSEMIDE 10 MG/ML IJ SOLN
40.0000 mg | Freq: Every day | INTRAMUSCULAR | Status: DC
  Administered 2017-07-31 – 2017-08-03 (×4): 40 mg via INTRAVENOUS
  Filled 2017-07-31 (×4): qty 4

## 2017-07-31 MED ORDER — ACETAMINOPHEN 325 MG PO TABS
650.0000 mg | ORAL_TABLET | Freq: Four times a day (QID) | ORAL | Status: DC | PRN
  Administered 2017-07-31 – 2017-08-03 (×5): 650 mg via ORAL
  Filled 2017-07-31 (×5): qty 2

## 2017-07-31 MED ORDER — LORATADINE 10 MG PO TABS
10.0000 mg | ORAL_TABLET | Freq: Every day | ORAL | Status: DC
  Administered 2017-07-31 – 2017-08-03 (×4): 10 mg via ORAL
  Filled 2017-07-31 (×4): qty 1

## 2017-07-31 MED ORDER — TAMSULOSIN HCL 0.4 MG PO CAPS
0.4000 mg | ORAL_CAPSULE | Freq: Every day | ORAL | Status: DC
  Administered 2017-07-31 – 2017-08-02 (×3): 0.4 mg via ORAL
  Filled 2017-07-31 (×3): qty 1

## 2017-07-31 MED ORDER — PROSIGHT PO TABS
1.0000 | ORAL_TABLET | Freq: Two times a day (BID) | ORAL | Status: DC
  Administered 2017-07-31 – 2017-08-03 (×7): 1 via ORAL
  Filled 2017-07-31 (×9): qty 1

## 2017-07-31 MED ORDER — ACETAMINOPHEN 650 MG RE SUPP: 650.0000 mg | Freq: Four times a day (QID) | RECTAL | Status: DC | PRN

## 2017-07-31 MED ORDER — FUROSEMIDE 10 MG/ML IJ SOLN
80.0000 mg | Freq: Once | INTRAMUSCULAR | Status: AC
  Administered 2017-07-31: 80 mg via INTRAVENOUS
  Filled 2017-07-31: qty 8

## 2017-07-31 MED ORDER — INSULIN ASPART 100 UNIT/ML ~~LOC~~ SOLN
0.0000 [IU] | Freq: Three times a day (TID) | SUBCUTANEOUS | Status: DC
  Administered 2017-07-31 (×2): 2 [IU] via SUBCUTANEOUS
  Administered 2017-07-31 – 2017-08-01 (×2): 1 [IU] via SUBCUTANEOUS
  Administered 2017-08-01: 2 [IU] via SUBCUTANEOUS
  Administered 2017-08-01: 1 [IU] via SUBCUTANEOUS
  Administered 2017-08-02: 3 [IU] via SUBCUTANEOUS
  Administered 2017-08-02: 2 [IU] via SUBCUTANEOUS
  Administered 2017-08-03: 1 [IU] via SUBCUTANEOUS
  Filled 2017-07-31 (×2): qty 1

## 2017-07-31 MED ORDER — ROPINIROLE HCL 1 MG PO TABS
1.0000 mg | ORAL_TABLET | Freq: Every day | ORAL | Status: DC
  Administered 2017-07-31 – 2017-08-02 (×3): 1 mg via ORAL
  Filled 2017-07-31 (×4): qty 1

## 2017-07-31 MED ORDER — LISINOPRIL 2.5 MG PO TABS
2.5000 mg | ORAL_TABLET | Freq: Every evening | ORAL | Status: DC
  Administered 2017-07-31 – 2017-08-02 (×3): 2.5 mg via ORAL
  Filled 2017-07-31 (×3): qty 1

## 2017-07-31 MED ORDER — PRESERVISION AREDS 2 PO CAPS: 1.0000 | ORAL_CAPSULE | Freq: Two times a day (BID) | ORAL | Status: DC

## 2017-07-31 MED ORDER — PRAVASTATIN SODIUM 20 MG PO TABS
20.0000 mg | ORAL_TABLET | Freq: Every evening | ORAL | Status: DC
  Administered 2017-07-31 – 2017-08-02 (×3): 20 mg via ORAL
  Filled 2017-07-31 (×4): qty 1

## 2017-07-31 MED ORDER — SODIUM CHLORIDE 0.9 % IV SOLN
1.0000 g | Freq: Once | INTRAVENOUS | Status: AC
  Administered 2017-07-31: 1 g via INTRAVENOUS
  Filled 2017-07-31: qty 10

## 2017-07-31 MED ORDER — FLUTICASONE PROPIONATE 50 MCG/ACT NA SUSP
2.0000 | Freq: Every day | NASAL | Status: DC | PRN
  Filled 2017-07-31: qty 16

## 2017-07-31 MED ORDER — ONDANSETRON HCL 4 MG/2ML IJ SOLN
4.0000 mg | Freq: Four times a day (QID) | INTRAMUSCULAR | Status: DC | PRN
  Administered 2017-07-31 (×3): 4 mg via INTRAVENOUS
  Filled 2017-07-31 (×3): qty 2

## 2017-07-31 MED ORDER — FLUOXETINE HCL 20 MG PO TABS
20.0000 mg | ORAL_TABLET | Freq: Every morning | ORAL | Status: DC
  Filled 2017-07-31: qty 1

## 2017-07-31 MED ORDER — IPRATROPIUM-ALBUTEROL 0.5-2.5 (3) MG/3ML IN SOLN
3.0000 mL | Freq: Once | RESPIRATORY_TRACT | Status: AC
  Administered 2017-07-31: 3 mL via RESPIRATORY_TRACT
  Filled 2017-07-31: qty 3

## 2017-07-31 MED ORDER — FLUOXETINE HCL 20 MG PO CAPS
20.0000 mg | ORAL_CAPSULE | Freq: Every day | ORAL | Status: DC
  Administered 2017-07-31 – 2017-08-03 (×4): 20 mg via ORAL
  Filled 2017-07-31 (×4): qty 1

## 2017-07-31 MED ORDER — ONDANSETRON HCL 4 MG PO TABS: 4.0000 mg | ORAL_TABLET | Freq: Four times a day (QID) | ORAL | Status: DC | PRN

## 2017-07-31 MED ORDER — ACEBUTOLOL HCL 200 MG PO CAPS
200.0000 mg | ORAL_CAPSULE | Freq: Two times a day (BID) | ORAL | Status: DC
  Administered 2017-07-31 – 2017-08-03 (×7): 200 mg via ORAL
  Filled 2017-07-31 (×7): qty 1

## 2017-07-31 MED ORDER — LEVOFLOXACIN IN D5W 750 MG/150ML IV SOLN
750.0000 mg | INTRAVENOUS | Status: DC
  Administered 2017-07-31 – 2017-08-02 (×3): 750 mg via INTRAVENOUS
  Filled 2017-07-31 (×3): qty 150

## 2017-07-31 MED ORDER — APREMILAST 30 MG PO TABS
30.0000 mg | ORAL_TABLET | Freq: Two times a day (BID) | ORAL | Status: DC
  Administered 2017-07-31 (×2): 1 via ORAL
  Administered 2017-08-01: 30 mg via ORAL
  Administered 2017-08-01: 1 via ORAL
  Administered 2017-08-02 – 2017-08-03 (×3): 30 mg via ORAL
  Filled 2017-07-31 (×10): qty 1

## 2017-07-31 MED ORDER — VITAMIN D 1000 UNITS PO TABS
5000.0000 [IU] | ORAL_TABLET | Freq: Every morning | ORAL | Status: DC
  Administered 2017-07-31 – 2017-08-03 (×3): 5000 [IU] via ORAL
  Filled 2017-07-31 (×3): qty 5

## 2017-07-31 NOTE — ED Notes (Signed)
Pt is not hungry, does not want breakfast.

## 2017-07-31 NOTE — ED Notes (Signed)
Nurse currently drawing labs 
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
## 2017-07-31 NOTE — ED Notes (Signed)
Notified Mendel Ryder, PM RN, to inform admitting MD of Troponin 0.03

## 2017-07-31 NOTE — ED Notes (Signed)
Pt ambulatory to restroom with standby assistance. Pt repositioned, sitting on couch.

## 2017-07-31 NOTE — ED Notes (Signed)
Pt vomiting bile.  zofran given.
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
## 2017-07-31 NOTE — ED Notes (Signed)
Pt given ginger ale, peanut butter, and crackers.

## 2017-07-31 NOTE — ED Notes (Signed)
Pt vomited and peed on floor, alerted physician

## 2017-07-31 NOTE — ED Notes (Addendum)
Message sent to [facility] about pts troponin of 0.03.

## 2017-07-31 NOTE — Progress Notes (Signed)
PROGRESS NOTE    [name]  PIR:[id number] DOB: 1945-07-22 DOA: 07/30/2017 PCP: [name], MD  Outpatient Specialists:   Brief Narrative: Patient is a 72 year old Caucasian male, morbidly obese, with past medical history significant for atrial fibrillation, nonobstructive coronary artery disease, diastolic dysfunction, hypertension, obstructive sleep apnea on BiPAP at nighttime., BPH, psoriatic arthritis, and anemia.  Patient was admitted with worsening shortness of breath.  Patient also endorsed fever, chills on cough that is productive of yellowish sputum.  Chest x-ray done on presentation revealed congestive findings.  Patient is on diuretics and antibiotics.  With diuresis and antibiotics, the patient feels improved.  Assessment & Plan:   Principal Problem:   Acute respiratory failure with hypoxia (HCC) Active Problems:   HYPERTENSION, BENIGN   Morbid (severe) obesity due to excess calories (HCC)   Paroxysmal atrial fibrillation (HCC)   Chronic diastolic heart failure (HCC)   Type 2 diabetes mellitus without complications (HCC)   Obstructive sleep apnea   Acute on chronic diastolic heart failure (HCC)   Fever   Psoriatic arthritis (HCC)   Restrictive lung disease   Pituitary adenoma (HCC)   Acute respiratory failure with hypoxia: -Likely related to acute on chronic diastolic congestive heart failure, OSA, undiagnosed OHS, acute tracheobronchitis versus pneumonic process. -Continue diuresis. -Continue to use BiPAP at nighttime. -Continue antibiotics. -Patient seems to be improving.  Acute on chronic diastolic congestive heart failure: -Continue IV Lasix 40 mg once daily. -Monitor volume status, and adjust diuretics accordingly. -On discharge, counseled the change in diuretic to torsemide or Bumex.  This is for better pharmacokinetics. -Last echo revealed EF of 55 to 60%, with restrictive physiology.  Possible pneumonia versus acute tracheobronchitis: -Follow  work-up in progress. -Continue antibiotics. -Patient's symptoms are improving.    Paroxysmal atrial fibrillation: -On acebutolol and Eliquis.   -Continue current regimen.    OSA/likely undiagnosed OHS on BiPAP: -Continue BiPAP at nighttime. -Encourage weight loss. -Diet and exercise.  Hypertension- -on lisinopril. -Continue to optimize.    Diabetes mellitus type 2: -Continue to optimize.    Anemia, chronic: -Monitor CBC.  Morbid obesity: -Diet and exercise.  History of pituitary adenoma denies any headache. History of nonobstructive CAD denies any chest pain.  On statins and apixaban. History of BPH continue finasteride. History of psoriatic arthritis may have to hold patient's [name] if continue to be having fever.   DVT prophylaxis: Apixaban. Code Status: DNR. Family Communication:   Disposition Plan: Home eventual. Consults called: None. Admission status: Inpatient.    Procedures:   None  Antimicrobials:   IV Levaquin   Subjective: Shortness of breath is improving.  Fever is improving.  Patient still has cough.  No chest pain.  No other constitutional symptoms reported.  Objective: Vitals:   07/31/17 0930 07/31/17 1000 07/31/17 1059 07/31/17 1107  BP:  (!) 119/55  (!) 101/50  Pulse: 75 70  67  Resp: (!) 25 (!) 27  (!) 25  Temp:   100 F (37.8 C)   TempSrc:   Oral   SpO2: 96% 95%  98%  Weight:      Height:        Intake/Output Summary (Last 24 hours) at 07/31/2017 1125 Last data filed at 07/31/2017 0954 Gross per 24 hour  Intake 250 ml  Output 570 ml  Net -320 ml   Filed Weights   07/31/17 0500  Weight: 130.9 kg (288 lb 9.3 oz)    Examination:  General exam: Appears calm and comfortable.  Morbidly  obese. Respiratory system: Clear to auscultation.  Cardiovascular system: S1 & S2. No pedal edema. Gastrointestinal system: Abdomen is morbidly obese, soft and nontender.  Organs are not palpable.  Bowel sounds are present.   Central  nervous system: Alert and oriented. No focal neurological deficits. Extremities: No leg edema.    Data Reviewed: I have personally reviewed following labs and imaging studies  CBC: Recent Labs  Lab 07/30/17 2144 07/31/17 0121 07/31/17 0600  WBC 8.1  --  10.6*  NEUTROABS  --  7.5 9.3*  HGB 12.1*  --  12.4*  HCT 36.4*  --  37.1*  MCV 91.2  --  90.7  PLT 199  --  814   Basic Metabolic Panel: Recent Labs  Lab 07/30/17 2144 07/31/17 0600  NA 135 135  K 3.9 3.5  CL 101 96*  CO2 24 21*  GLUCOSE 176* 174*  BUN 17 14  CREATININE 1.10 1.01  CALCIUM 9.0 9.2   GFR: Estimated Creatinine Clearance: 90 mL/min (by C-G formula based on SCr of 1.01 mg/dL). Liver Function Tests: Recent Labs  Lab 07/31/17 0600  AST 21  ALT 12*  ALKPHOS 91  BILITOT 1.9*  PROT 8.6*  ALBUMIN 4.0   No results for input(s): LIPASE, AMYLASE in the last 168 hours. No results for input(s): AMMONIA in the last 168 hours. Coagulation Profile: No results for input(s): INR, PROTIME in the last 168 hours. Cardiac Enzymes: Recent Labs  Lab 07/31/17 0600  TROPONINI 0.03*   BNP (last 3 results) No results for input(s): PROBNP in the last 8760 hours. HbA1C: No results for input(s): HGBA1C in the last 72 hours. CBG: Recent Labs  Lab 07/31/17 0719  GLUCAP 166*   Lipid Profile: No results for input(s): CHOL, HDL, LDLCALC, TRIG, CHOLHDL, LDLDIRECT in the last 72 hours. Thyroid Function Tests: Recent Labs    07/31/17 0600  TSH 2.372   Anemia Panel: No results for input(s): VITAMINB12, FOLATE, FERRITIN, TIBC, IRON, RETICCTPCT in the last 72 hours. Urine analysis:    Component Value Date/Time   COLORURINE STRAW (A) 01/05/2016 0937   APPEARANCEUR CLEAR (A) 01/05/2016 0937   LABSPEC 1.009 01/05/2016 0937   PHURINE 5.0 01/05/2016 0937   GLUCOSEU 150 (A) 01/05/2016 0937   HGBUR 1+ (A) 01/05/2016 0937   BILIRUBINUR NEGATIVE 01/05/2016 0937   BILIRUBINUR neg 12/29/2012 1334   KETONESUR NEGATIVE  01/05/2016 0937   PROTEINUR NEGATIVE 01/05/2016 0937   UROBILINOGEN 1.0 10/17/2014 1945   NITRITE NEGATIVE 01/05/2016 0937   LEUKOCYTESUR NEGATIVE 01/05/2016 0937   Sepsis Labs: @LABRCNTIP (procalcitonin:4,lacticidven:4)  )No results found for this or any previous visit (from the past 240 hour(s)).       Radiology Studies: Dg Chest 2 View  Result Date: 07/30/2017 CLINICAL DATA:  Increased shortness of breath and weight gain since Sunday. History of CHF. Two doses of Lasix on Sunday. Complains of some chest pressure and cough. History of hypertension and diabetes and atrial fibrillation. EXAM: CHEST - 2 VIEW COMPARISON:  10/17/2014 FINDINGS: Cardiac enlargement with pulmonary vascular congestion. Mild peripheral interstitial pattern suggesting interstitial edema. This is progressing since the previous study. No pleural effusions. No pneumothorax. Mediastinal contours appear intact. Calcification of the aorta. Degenerative changes in the spine and shoulders. IMPRESSION: Congestive changes with cardiac enlargement, pulmonary vascular congestion, and mild interstitial edema. No focal consolidation or effusion. Aortic atherosclerosis. Electronically Signed   By: Lucienne Capers M.D.   On: 07/30/2017 22:41        Scheduled Meds: . acebutolol  200 mg Oral BID  . apixaban  5 mg Oral BID  . Apremilast  1 tablet Oral BID  . cholecalciferol  5,000 Units Oral q morning - 10a  . FLUoxetine  20 mg Oral Daily  . furosemide  40 mg Intravenous Daily  . insulin aspart  0-9 Units Subcutaneous TID WC  . lisinopril  2.5 mg Oral QPM  . loratadine  10 mg Oral Daily  . multivitamin  1 tablet Oral BID  . pravastatin  20 mg Oral QPM  . rOPINIRole  1 mg Oral QHS  . tamsulosin  0.4 mg Oral QHS   Continuous Infusions: . levofloxacin (LEVAQUIN) IV Stopped (07/31/17 0954)     LOS: 0 days    Time spent: 35 minutes    Dana Allan, MD  [facility] Pager #: [phone] 7PM-7AM contact  night coverage as above

## 2017-07-31 NOTE — ED Notes (Signed)
Patient developed nausea and emesis x 1. States for lunch did eat a half a sandwich without incident.

## 2017-07-31 NOTE — ED Notes (Signed)
Message sent to pharmacy requesting flonase be verified and sent to ED.

## 2017-07-31 NOTE — ED Notes (Signed)
HH/Carbmod ordered

## 2017-08-01 ENCOUNTER — Inpatient Hospital Stay (HOSPITAL_COMMUNITY): Payer: Medicare Other

## 2017-08-01 LAB — CBC WITH DIFFERENTIAL/PLATELET
Basophils Absolute: 0 10*3/uL (ref 0.0–0.1)
Basophils Relative: 0 %
Eosinophils Absolute: 0 10*3/uL (ref 0.0–0.7)
Eosinophils Relative: 0 %
HCT: 35.8 % — ABNORMAL LOW (ref 39.0–52.0)
Hemoglobin: 11.8 g/dL — ABNORMAL LOW (ref 13.0–17.0)
Lymphocytes Relative: 13 %
Lymphs Abs: 0.9 10*3/uL (ref 0.7–4.0)
MCH: 29.8 pg (ref 26.0–34.0)
MCHC: 33 g/dL (ref 30.0–36.0)
MCV: 90.4 fL (ref 78.0–100.0)
Monocytes Absolute: 0.3 10*3/uL (ref 0.1–1.0)
Monocytes Relative: 5 %
Neutro Abs: 6.2 10*3/uL (ref 1.7–7.7)
Neutrophils Relative %: 82 %
Platelets: 167 10*3/uL (ref 150–400)
RBC: 3.96 MIL/uL — ABNORMAL LOW (ref 4.22–5.81)
RDW: 14.1 % (ref 11.5–15.5)
WBC: 7.5 10*3/uL (ref 4.0–10.5)

## 2017-08-01 LAB — RENAL FUNCTION PANEL
Albumin: 3.4 g/dL — ABNORMAL LOW (ref 3.5–5.0)
Anion gap: 9 (ref 5–15)
BUN: 21 mg/dL — ABNORMAL HIGH (ref 6–20)
CO2: 28 mmol/L (ref 22–32)
Calcium: 8.5 mg/dL — ABNORMAL LOW (ref 8.9–10.3)
Chloride: 101 mmol/L (ref 101–111)
Creatinine, Ser: 1.09 mg/dL (ref 0.61–1.24)
GFR calc Af Amer: >60 mL/min (ref >60)
GFR calc non Af Amer: >60 mL/min (ref >60)
Glucose, Bld: 122 mg/dL — ABNORMAL HIGH (ref 65–99)
Phosphorus: 3 mg/dL (ref 2.5–4.6)
Potassium: 3.5 mmol/L (ref 3.5–5.1)
Sodium: 138 mmol/L (ref 135–145)

## 2017-08-01 LAB — GLUCOSE, CAPILLARY
GLUCOSE-CAPILLARY: 133 mg/dL — AB (ref 65–99)
GLUCOSE-CAPILLARY: 124 mg/dL — AB (ref 65–99)
Glucose-Capillary: 118 mg/dL — ABNORMAL HIGH (ref 65–99)
Glucose-Capillary: 198 mg/dL — ABNORMAL HIGH (ref 65–99)

## 2017-08-01 LAB — MAGNESIUM: Magnesium: 1.8 mg/dL (ref 1.7–2.4)

## 2017-08-01 MED ORDER — PREDNISONE 20 MG PO TABS
40.0000 mg | ORAL_TABLET | Freq: Every day | ORAL | Status: DC
  Administered 2017-08-01 – 2017-08-03 (×3): 40 mg via ORAL
  Filled 2017-08-01 (×3): qty 2

## 2017-08-01 MED ORDER — MAGNESIUM SULFATE IN D5W 1-5 GM/100ML-% IV SOLN
1.0000 g | Freq: Once | INTRAVENOUS | Status: AC
  Administered 2017-08-01: 1 g via INTRAVENOUS
  Filled 2017-08-01: qty 100

## 2017-08-01 MED ORDER — IPRATROPIUM-ALBUTEROL 0.5-2.5 (3) MG/3ML IN SOLN
3.0000 mL | Freq: Four times a day (QID) | RESPIRATORY_TRACT | Status: DC
  Administered 2017-08-01 – 2017-08-02 (×6): 3 mL via RESPIRATORY_TRACT
  Filled 2017-08-01 (×6): qty 3

## 2017-08-01 MED ORDER — POTASSIUM CHLORIDE CRYS ER 20 MEQ PO TBCR
40.0000 meq | EXTENDED_RELEASE_TABLET | Freq: Once | ORAL | Status: AC
Start: 2017-08-01 — End: 2017-08-01
  Administered 2017-08-01: 40 meq via ORAL
  Filled 2017-08-01: qty 2

## 2017-08-01 NOTE — Progress Notes (Signed)
Patient on 3 lpm oxygen via nasal canula.  Patient removes oxygen when ambulating to bathroom with nurse present.  No complaints of shortness of breath.  Patient did complain of nausea and Zofran given via IV.  No further complaints of nausea noted.  Safety and comfort measures maintained, call bell within reach.

## 2017-08-01 NOTE — Progress Notes (Signed)
RT setup Patients home CPAP.  CPAP in good condition with no frayed wires.  Sterile water added and placed at bedside within reach of patient.  Patient comfortable and resting.  RT will continue to monitor.

## 2017-08-01 NOTE — Progress Notes (Signed)
PROGRESS NOTE    [name]  HMC:[id number] DOB: 1945/09/26 DOA: 07/30/2017 PCP: [name], MD  Outpatient Specialists:   Brief Narrative: Patient is a 72 year old Caucasian male, morbidly obese, with past medical history significant for atrial fibrillation, nonobstructive coronary artery disease, diastolic dysfunction, hypertension, obstructive sleep apnea on BiPAP at nighttime., BPH, psoriatic arthritis, and anemia.  Patient was admitted with worsening shortness of breath.  Patient also endorsed fever, chills on cough that is productive of yellowish sputum.  Chest x-ray done on presentation revealed congestive findings.  Patient is on diuretics and antibiotics.  With diuresis and antibiotics, the patient feels improved.  08/01/2017: Patient seen.  Patient continues to improve, but not back to baseline.  Shortness of breath is improving.  Patient has expiratory wheeze.  Chest x-ray repeated today.  Renal panel, magnesium and phosphorus also repeated.  Will start patient on oral prednisone.  Will replete magnesium.  We will also replete potassium.  We have a low threshold to refer patient to pulmonary team for further assessment and management of pulmonary symptoms.  We will definitely have a low threshold to proceed with CT scan of the chest with contrast, as patient may have chronic interstitial lung disease changes.  Assess patient's oxygen requirement prior to discharge.  Further management will depend on hospital course.  Assessment & Plan:   Principal Problem:   Acute respiratory failure with hypoxia (HCC) Active Problems:   HYPERTENSION, BENIGN   Morbid (severe) obesity due to excess calories (HCC)   Paroxysmal atrial fibrillation (HCC)   Chronic diastolic heart failure (HCC)   Type 2 diabetes mellitus without complications (HCC)   Obstructive sleep apnea   Acute on chronic diastolic heart failure (HCC)   Fever   Psoriatic arthritis (HCC)   Restrictive lung disease  Pituitary adenoma (HCC)   Acute respiratory failure with hypoxia: -Likely related to acute on chronic diastolic congestive heart failure, OSA, undiagnosed OHS, acute tracheobronchitis versus pneumonic process. -Continue diuresis. -Continue to use BiPAP at nighttime. -Continue antibiotics. -Patient seems to be improving. -08/01/2017: Repeat chest x-ray.  Start patient on oral prednisone.  Low threshold to refer to pulmonary team on discharge.  Low threshold to also proceed with CT scan of the chest with IV contrast to assess for possible underlying chronic interstitial lung disease changes.  Acute on chronic diastolic congestive heart failure: -Continue IV Lasix 40 mg once daily. -Monitor volume status, and adjust diuretics accordingly. -On discharge, counseled the change in diuretic to torsemide or Bumex.  This is for better pharmacokinetics. -Last echo revealed EF of 55 to 60%, with restrictive physiology. -Monitor renal function closely.  Adjust diuretics use a serum creatinine continues to rise.  Serum creatinine today is 1.09.  We will also monitor electrolytes and correct as deemed necessary.  Possible pneumonia versus acute tracheobronchitis: -Follow work-up in progress. -Continue antibiotics. -Patient's symptoms are improving. -08/01/2017: Continue antibiotics and start oral prednisone.  Paroxysmal atrial fibrillation: -On acebutolol and Eliquis.   -Continue current regimen.    OSA/likely undiagnosed OHS on BiPAP: -Continue BiPAP at nighttime. -Encourage weight loss. -Diet and exercise.  Hypertension- -on lisinopril. -Continue to optimize.    Diabetes mellitus type 2: -Continue to optimize.    Anemia, chronic: -Monitor CBC.  Morbid obesity: -Diet and exercise.  History of pituitary adenoma denies any headache. History of nonobstructive CAD denies any chest pain.  On statins and apixaban. History of BPH continue finasteride. History of psoriatic arthritis may  have to hold patient's [name] if  continue to be having fever.   DVT prophylaxis: Apixaban. Code Status: DNR. Family Communication:   Disposition Plan: Home eventual. Consults called: None. Admission status: Inpatient.    Procedures:   None  Antimicrobials:   IV Levaquin   Subjective: Shortness of breath is improving.  No reported fever overnight.  No chest pain.  No other constitutional symptoms reported.  Objective: Vitals:   08/01/17 0239 08/01/17 0811 08/01/17 1535 08/01/17 1609  BP:  106/66  (!) 109/46  Pulse:  77 73 (!) 58  Resp:  (!) 22 20 20   Temp:  98.5 F (36.9 C)  98.5 F (36.9 C)  TempSrc:    Oral  SpO2:  98% 96% 98%  Weight: 124.8 kg (275 lb 2.2 oz)     Height:        Intake/Output Summary (Last 24 hours) at 08/01/2017 1801 Last data filed at 08/01/2017 1300 Gross per 24 hour  Intake 1680 ml  Output -  Net 1680 ml   Filed Weights   07/31/17 0500 07/31/17 1720 08/01/17 0239  Weight: 130.9 kg (288 lb 9.3 oz) 125.9 kg (277 lb 9 oz) 124.8 kg (275 lb 2.2 oz)    Examination:  General exam: Appears calm and comfortable.  Morbidly obese. Respiratory system: Decreased air entry, with expiratory wheeze.   Cardiovascular system: S1 & S2. No pedal edema. Gastrointestinal system: Abdomen is morbidly obese, soft and nontender.  Organs are not palpable.  Bowel sounds are present.   Central nervous system: Alert and oriented. No focal neurological deficits. Extremities: No leg edema.    Data Reviewed: I have personally reviewed following labs and imaging studies  CBC: Recent Labs  Lab 07/30/17 2144 07/31/17 0121 07/31/17 0600 08/01/17 1339  WBC 8.1  --  10.6* 7.5  NEUTROABS  --  7.5 9.3* 6.2  HGB 12.1*  --  12.4* 11.8*  HCT 36.4*  --  37.1* 35.8*  MCV 91.2  --  90.7 90.4  PLT 199  --  207 937   Basic Metabolic Panel: Recent Labs  Lab 07/30/17 2144 07/31/17 0600 08/01/17 1339  NA 135 135 138  K 3.9 3.5 3.5  CL 101 96* 101  CO2 24 21* 28    GLUCOSE 176* 174* 122*  BUN 17 14 21*  CREATININE 1.10 1.01 1.09  CALCIUM 9.0 9.2 8.5*  MG  --   --  1.8  PHOS  --   --  3.0   GFR: Estimated Creatinine Clearance: 81.2 mL/min (by C-G formula based on SCr of 1.09 mg/dL). Liver Function Tests: Recent Labs  Lab 07/31/17 0600 08/01/17 1339  AST 21  --   ALT 12*  --   ALKPHOS 91  --   BILITOT 1.9*  --   PROT 8.6*  --   ALBUMIN 4.0 3.4*   No results for input(s): LIPASE, AMYLASE in the last 168 hours. No results for input(s): AMMONIA in the last 168 hours. Coagulation Profile: No results for input(s): INR, PROTIME in the last 168 hours. Cardiac Enzymes: Recent Labs  Lab 07/31/17 0600 07/31/17 1837  TROPONINI 0.03* 0.04*   BNP (last 3 results) No results for input(s): PROBNP in the last 8760 hours. HbA1C: No results for input(s): HGBA1C in the last 72 hours. CBG: Recent Labs  Lab 07/31/17 1743 07/31/17 2156 08/01/17 0808 08/01/17 1129 08/01/17 1527  GLUCAP 162* 112* 133* 124* 118*   Lipid Profile: No results for input(s): CHOL, HDL, LDLCALC, TRIG, CHOLHDL, LDLDIRECT in the last 72 hours.  Thyroid Function Tests: Recent Labs    07/31/17 0600  TSH 2.372   Anemia Panel: No results for input(s): VITAMINB12, FOLATE, FERRITIN, TIBC, IRON, RETICCTPCT in the last 72 hours. Urine analysis:    Component Value Date/Time   COLORURINE STRAW (A) 01/05/2016 0937   APPEARANCEUR CLEAR (A) 01/05/2016 0937   LABSPEC 1.009 01/05/2016 0937   PHURINE 5.0 01/05/2016 0937   GLUCOSEU 150 (A) 01/05/2016 0937   HGBUR 1+ (A) 01/05/2016 0937   BILIRUBINUR NEGATIVE 01/05/2016 0937   BILIRUBINUR neg 12/29/2012 1334   KETONESUR NEGATIVE 01/05/2016 0937   PROTEINUR NEGATIVE 01/05/2016 0937   UROBILINOGEN 1.0 10/17/2014 1945   NITRITE NEGATIVE 01/05/2016 0937   LEUKOCYTESUR NEGATIVE 01/05/2016 0937   Sepsis Labs: @LABRCNTIP (procalcitonin:4,lacticidven:4)  ) Recent Results (from the past 240 hour(s))  Blood culture (routine x 2)      Status: None (Preliminary result)   Collection Time: 07/31/17  4:00 AM  Result Value Ref Range Status   Specimen Description BLOOD LEFT WRIST  Final   Special Requests   Final    BOTTLES DRAWN AEROBIC AND ANAEROBIC Blood Culture adequate volume   Culture   Final    NO GROWTH 1 DAY Performed at [facility], [place] [address], [place], [place] [address]    Report Status PENDING  Incomplete  Blood culture (routine x 2)     Status: None (Preliminary result)   Collection Time: 07/31/17  4:20 AM  Result Value Ref Range Status   Specimen Description BLOOD RIGHT FOREARM  Final   Special Requests   Final    BOTTLES DRAWN AEROBIC AND ANAEROBIC Blood Culture adequate volume   Culture   Final    NO GROWTH 1 DAY Performed at [facility], [place] [address], [place], [place] [address]    Report Status PENDING  Incomplete         Radiology Studies: Dg Chest 2 View  Result Date: 08/01/2017 CLINICAL DATA:  Short of breath over several days EXAM: CHEST - 2 VIEW COMPARISON:  07/30/2017 FINDINGS: Heart is moderately enlarged. Heterogeneous opacities are stable. No pneumothorax. No pleural effusion. Stable thoracic spine. IMPRESSION: Stable basilar edema. Electronically Signed   By: Marybelle Killings M.D.   On: 08/01/2017 16:01   Dg Chest 2 View  Result Date: 07/30/2017 CLINICAL DATA:  Increased shortness of breath and weight gain since Sunday. History of CHF. Two doses of Lasix on Sunday. Complains of some chest pressure and cough. History of hypertension and diabetes and atrial fibrillation. EXAM: CHEST - 2 VIEW COMPARISON:  10/17/2014 FINDINGS: Cardiac enlargement with pulmonary vascular congestion. Mild peripheral interstitial pattern suggesting interstitial edema. This is progressing since the previous study. No pleural effusions. No pneumothorax. Mediastinal contours appear intact. Calcification of the aorta. Degenerative changes in the spine and shoulders. IMPRESSION: Congestive changes  with cardiac enlargement, pulmonary vascular congestion, and mild interstitial edema. No focal consolidation or effusion. Aortic atherosclerosis. Electronically Signed   By: Lucienne Capers M.D.   On: 07/30/2017 22:41        Scheduled Meds: . acebutolol  200 mg Oral BID  . apixaban  5 mg Oral BID  . Apremilast  30 mg Oral BID  . cholecalciferol  5,000 Units Oral q morning - 10a  . FLUoxetine  20 mg Oral Daily  . furosemide  40 mg Intravenous Daily  . insulin aspart  0-9 Units Subcutaneous TID WC  . ipratropium-albuterol  3 mL Nebulization Q6H  . lisinopril  2.5 mg  Oral QPM  . loratadine  10 mg Oral Daily  . multivitamin  1 tablet Oral BID  . potassium chloride  40 mEq Oral Once  . pravastatin  20 mg Oral QPM  . predniSONE  40 mg Oral Q breakfast  . rOPINIRole  1 mg Oral QHS  . tamsulosin  0.4 mg Oral QHS   Continuous Infusions: . levofloxacin (LEVAQUIN) IV Stopped (08/01/17 1114)  . magnesium sulfate 1 - 4 g bolus IVPB       LOS: 1 day    Time spent: 35 minutes    Dana Allan, MD  [facility] Pager #: [phone] 7PM-7AM contact night coverage as above

## 2017-08-02 LAB — GLUCOSE, CAPILLARY
GLUCOSE-CAPILLARY: 119 mg/dL — AB (ref 65–99)
GLUCOSE-CAPILLARY: 192 mg/dL — AB (ref 65–99)
GLUCOSE-CAPILLARY: 161 mg/dL — AB (ref 65–99)
Glucose-Capillary: 214 mg/dL — ABNORMAL HIGH (ref 65–99)

## 2017-08-02 MED ORDER — IPRATROPIUM-ALBUTEROL 0.5-2.5 (3) MG/3ML IN SOLN
3.0000 mL | Freq: Three times a day (TID) | RESPIRATORY_TRACT | Status: DC
  Administered 2017-08-03: 3 mL via RESPIRATORY_TRACT
  Filled 2017-08-02: qty 3

## 2017-08-02 MED ORDER — LEVOFLOXACIN 500 MG PO TABS
500.0000 mg | ORAL_TABLET | Freq: Every day | ORAL | Status: DC
  Administered 2017-08-03: 500 mg via ORAL
  Filled 2017-08-02: qty 1

## 2017-08-02 NOTE — Progress Notes (Signed)
This patient is receiving the antibioticLevofloacin by the intravenous route. Based on criteria approved by the Pharmacy and Therapeutics Committee, and the Infectious Disease Division, the antibiotic is being converted to equivalent oral dose form(s). These criteria include: . Patient being treated for a respiratory tract infection . The patient is not neutropenic and does not exhibit a GI malabsorption state . The patient is eating and has been taking other orally administered medications for at least 24 hours. . The patient is improving clinically (physician assessment and a 24-hour Tmax of less than?100.5? F). If you have questions about this conversion, please contact the pharmacy department. Thank you.   Corinda Gubler, PharmD 08/02/17

## 2017-08-02 NOTE — Progress Notes (Signed)
Patient resting peacefully at this time.  Meds given per order.  Patient ambulates to bathroom without difficulty, steady gait noted.  Oxygen remains at 3 lpm via nasal canula.  No voiced complaints this shift.  Safety and comfort measures maintained.  Call bell remains within reach.  Will continue to monitor.

## 2017-08-02 NOTE — Progress Notes (Addendum)
PROGRESS NOTE    [name]  GGY:[id number] DOB: [birth year] DOA: 07/30/2017 PCP: [name], MD  Outpatient Specialists:   Brief Narrative: Patient is a 73 year old Caucasian male, morbidly obese, with past medical history significant for atrial fibrillation, nonobstructive coronary artery disease, diastolic dysfunction, hypertension, obstructive sleep apnea on BiPAP at nighttime., BPH, psoriatic arthritis, and anemia.  Patient was admitted with worsening shortness of breath.  Patient also endorsed fever, chills on cough that is productive of yellowish sputum.  Chest x-ray done on presentation revealed congestive findings.  Patient is on diuretics and antibiotics.  With diuresis and antibiotics, the patient feels improved.  08/01/2017: Patient seen.  Patient continues to improve, but not back to baseline.  Shortness of breath is improving.  Patient has expiratory wheeze.  Chest x-ray repeated today.  Renal panel, magnesium and phosphorus also repeated.  Will start patient on oral prednisone.  Will replete magnesium.  We will also replete potassium.  We have a low threshold to refer patient to pulmonary team for further assessment and management of pulmonary symptoms.  We will definitely have a low threshold to proceed with CT scan of the chest with contrast, as patient may have chronic interstitial lung disease changes.  Assess patient's oxygen requirement prior to discharge.  Further management will depend on hospital course.  08/02/17:  patient seen at beside,doing much better , no more SOB.STILL  HAS SOME COUGH but not as bad.  DISCUSSED CHEST XRAY of 08/01/17 Assessment & Plan:   Principal Problem:   Acute respiratory failure with hypoxia (HCC) Active Problems:   HYPERTENSION, BENIGN   Morbid (severe) obesity due to excess calories (HCC)   Paroxysmal atrial fibrillation (HCC)   Chronic diastolic heart failure (HCC)   Type 2 diabetes mellitus without complications (HCC)   Obstructive  sleep apnea   Acute on chronic diastolic heart failure (HCC)   Fever   Psoriatic arthritis (HCC)   Restrictive lung disease   Pituitary adenoma ([place])   Acute respiratory failure with hypoxia: -Likely related to acute on chronic diastolic congestive heart failure, OSA, undiagnosed OHS, acute tracheobronchitis versus pneumonic process. -Continue diuresis. -Continue to use BiPAP at nighttime. -Continue antibiotics. -Patient seems to be improving. -08/01/2017: Repeat chest x-ray.  Start patient on oral prednisone.  Low threshold to refer to pulmonary team on discharge.  Low threshold to also proceed with CT scan of the chest with IV contrast to assess for possible underlying chronic interstitial lung disease changes.  Acute on chronic diastolic congestive heart failure: -Continue IV Lasix 40 mg once daily. -Monitor volume status, and adjust diuretics accordingly. -On discharge, counseled the change in diuretic to torsemide or Bumex.  This is for better pharmacokinetics. -Last echo revealed EF of 55 to 60%, with restrictive physiology. -Monitor renal function closely.  Adjust diuretics use a serum creatinine continues to rise.  Serum creatinine today is 1.09.  We will also monitor electrolytes and correct as deemed necessary.  Possible pneumonia versus acute tracheobronchitis: -Follow work-up in progress. -Continue antibiotics. -Patient's symptoms are improving. -08/01/2017: Continue antibiotics and start oral prednisone.  Paroxysmal atrial fibrillation: -On acebutolol and Eliquis.   -Continue current regimen.    OSA/likely undiagnosed OHS on BiPAP: -Continue BiPAP at nighttime. -Encourage weight loss. -Diet and exercise.  Hypertension- -on lisinopril. -Continue to optimize.    Diabetes mellitus type 2: -Continue to optimize.    Anemia, chronic: -Monitor CBC.  Morbid obesity: -Diet and exercise.  History of pituitary adenoma denies any headache. History of  nonobstructive CAD denies any chest pain.  On statins and apixaban. History of BPH continue finasteride. History of psoriatic arthritis may have to hold patient's [name] if continue to be having fever.   DVT prophylaxis: Apixaban. Code Status: DNR. Family Communication:   Disposition Plan: Home eventual in 1-2 days . Consults called: None. Admission status: Inpatient.    Procedures:   None  Antimicrobials:   IV Levaquin   Subjective: Shortness of breath is improving.  No reported fever overnight.  No chest pain.  No other constitutional symptoms reported.  Objective: Vitals:   08/02/17 0835 08/02/17 0852 08/02/17 1433 08/02/17 1533  BP:  (!) 163/62  123/66  Pulse:  (!) 41  (!) 43  Resp:  20  (!) 21  Temp:  97.8 F (36.6 C)  97.8 F (36.6 C)  TempSrc:  Oral  Oral  SpO2: 95% 99% 98% 94%  Weight:      Height:        Intake/Output Summary (Last 24 hours) at 08/02/2017 1638 Last data filed at 08/01/2017 2015 Gross per 24 hour  Intake 240 ml  Output -  Net 240 ml   Filed Weights   07/31/17 1720 08/01/17 0239 08/02/17 0248  Weight: 125.9 kg (277 lb 9 oz) 124.8 kg (275 lb 2.2 oz) 124.6 kg (274 lb 11.1 oz)    Examination:  General exam: Appears calm and comfortable.  Morbidly obese. Respiratory system: Decreased air entry, no wheeze.   Cardiovascular system: S1 & S2. No pedal edema. Gastrointestinal system: Abdomen is morbidly obese, soft and nontender.  Organs are not palpable.  Bowel sounds are present.   Central nervous system: Alert and oriented. No focal neurological deficits. Extremities: No leg edema.    Data Reviewed: I have personally reviewed following labs and imaging studies  CBC: Recent Labs  Lab 07/30/17 2144 07/31/17 0121 07/31/17 0600 08/01/17 1339  WBC 8.1  --  10.6* 7.5  NEUTROABS  --  7.5 9.3* 6.2  HGB 12.1*  --  12.4* 11.8*  HCT 36.4*  --  37.1* 35.8*  MCV 91.2  --  90.7 90.4  PLT 199  --  207 253   Basic Metabolic Panel: Recent  Labs  Lab 07/30/17 2144 07/31/17 0600 08/01/17 1339  NA 135 135 138  K 3.9 3.5 3.5  CL 101 96* 101  CO2 24 21* 28  GLUCOSE 176* 174* 122*  BUN 17 14 21*  CREATININE 1.10 1.01 1.09  CALCIUM 9.0 9.2 8.5*  MG  --   --  1.8  PHOS  --   --  3.0   GFR: Estimated Creatinine Clearance: 81.2 mL/min (by C-G formula based on SCr of 1.09 mg/dL). Liver Function Tests: Recent Labs  Lab 07/31/17 0600 08/01/17 1339  AST 21  --   ALT 12*  --   ALKPHOS 91  --   BILITOT 1.9*  --   PROT 8.6*  --   ALBUMIN 4.0 3.4*   No results for input(s): LIPASE, AMYLASE in the last 168 hours. No results for input(s): AMMONIA in the last 168 hours. Coagulation Profile: No results for input(s): INR, PROTIME in the last 168 hours. Cardiac Enzymes: Recent Labs  Lab 07/31/17 0600 07/31/17 1837  TROPONINI 0.03* 0.04*   BNP (last 3 results) No results for input(s): PROBNP in the last 8760 hours. HbA1C: No results for input(s): HGBA1C in the last 72 hours. CBG: Recent Labs  Lab 08/01/17 1527 08/01/17 2051 08/02/17 0742 08/02/17 1140 08/02/17 1533  GLUCAP 118* 198* 119* 214* 192*   Lipid Profile: No results for input(s): CHOL, HDL, LDLCALC, TRIG, CHOLHDL, LDLDIRECT in the last 72 hours. Thyroid Function Tests: Recent Labs    07/31/17 0600  TSH 2.372   Anemia Panel: No results for input(s): VITAMINB12, FOLATE, FERRITIN, TIBC, IRON, RETICCTPCT in the last 72 hours. Urine analysis:    Component Value Date/Time   COLORURINE STRAW (A) 01/05/2016 0937   APPEARANCEUR CLEAR (A) 01/05/2016 0937   LABSPEC 1.009 01/05/2016 0937   PHURINE 5.0 01/05/2016 0937   GLUCOSEU 150 (A) 01/05/2016 0937   HGBUR 1+ (A) 01/05/2016 0937   BILIRUBINUR NEGATIVE 01/05/2016 0937   BILIRUBINUR neg 12/29/2012 1334   KETONESUR NEGATIVE 01/05/2016 0937   PROTEINUR NEGATIVE 01/05/2016 0937   UROBILINOGEN 1.0 10/17/2014 1945   NITRITE NEGATIVE 01/05/2016 0937   LEUKOCYTESUR NEGATIVE 01/05/2016 0937   Sepsis  Labs: @LABRCNTIP (procalcitonin:4,lacticidven:4)  ) Recent Results (from the past 240 hour(s))  Blood culture (routine x 2)     Status: None (Preliminary result)   Collection Time: 07/31/17  4:00 AM  Result Value Ref Range Status   Specimen Description BLOOD LEFT WRIST  Final   Special Requests   Final    BOTTLES DRAWN AEROBIC AND ANAEROBIC Blood Culture adequate volume   Culture   Final    NO GROWTH 2 DAYS Performed at [facility], [place] [address] [place], [place] [address]    Report Status PENDING  Incomplete  Blood culture (routine x 2)     Status: None (Preliminary result)   Collection Time: 07/31/17  4:20 AM  Result Value Ref Range Status   Specimen Description BLOOD RIGHT FOREARM  Final   Special Requests   Final    BOTTLES DRAWN AEROBIC AND ANAEROBIC Blood Culture adequate volume   Culture   Final    NO GROWTH 2 DAYS Performed at [facility], [place] [address], [place], [place] [address]    Report Status PENDING  Incomplete         Radiology Studies: Dg Chest 2 View  Result Date: 08/01/2017 CLINICAL DATA:  Short of breath over several days EXAM: CHEST - 2 VIEW COMPARISON:  07/30/2017 FINDINGS: Heart is moderately enlarged. Heterogeneous opacities are stable. No pneumothorax. No pleural effusion. Stable thoracic spine. IMPRESSION: Stable basilar edema. Electronically Signed   By: Marybelle Killings M.D.   On: 08/01/2017 16:01        Scheduled Meds: . acebutolol  200 mg Oral BID  . apixaban  5 mg Oral BID  . Apremilast  30 mg Oral BID  . cholecalciferol  5,000 Units Oral q morning - 10a  . FLUoxetine  20 mg Oral Daily  . furosemide  40 mg Intravenous Daily  . insulin aspart  0-9 Units Subcutaneous TID WC  . ipratropium-albuterol  3 mL Nebulization Q6H  . [START ON 08/03/2017] levofloxacin  500 mg Oral Daily  . lisinopril  2.5 mg Oral QPM  . loratadine  10 mg Oral Daily  . multivitamin  1 tablet Oral BID  . pravastatin  20 mg Oral QPM  . predniSONE  40 mg  Oral Q breakfast  . rOPINIRole  1 mg Oral QHS  . tamsulosin  0.4 mg Oral QHS   Continuous Infusions:   LOS 2   Time spent: 25 minutes

## 2017-08-03 LAB — GLUCOSE, CAPILLARY: Glucose-Capillary: 140 mg/dL — ABNORMAL HIGH (ref 65–99)

## 2017-08-03 MED ORDER — PREDNISONE 20 MG PO TABS: 20.0000 mg | ORAL_TABLET | Freq: Every day | ORAL | 0 refills | Status: DC

## 2017-08-03 MED ORDER — GUAIFENESIN ER 600 MG PO TB12: 600.0000 mg | ORAL_TABLET | Freq: Two times a day (BID) | ORAL | 0 refills | Status: AC

## 2017-08-03 MED ORDER — LEVOFLOXACIN 500 MG PO TABS: 500.0000 mg | ORAL_TABLET | Freq: Every day | ORAL | 0 refills | Status: DC

## 2017-08-03 MED ORDER — FUROSEMIDE 40 MG PO TABS: 40.0000 mg | ORAL_TABLET | Freq: Every morning | ORAL | 3 refills | Status: DC

## 2017-08-03 NOTE — Progress Notes (Signed)
SATURATION QUALIFICATIONS: (This note is used to comply with regulatory documentation for home oxygen)  Patient Saturations on Room Air at Rest = 94%  Patient Saturations on Room Air while Ambulating = 98%  Patient Saturations on 0 Liters of oxygen while Ambulating = 98%  Please briefly explain why patient needs home oxygen:  Patient ambulated on room air. O2 saturations did not drop below 94%. Patient without complaints of SOB.   English as a second language teacher
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
## 2017-08-03 NOTE — Progress Notes (Signed)
..  Patient given all discharge paper work. All pages reviewed and subsequent questions answered. Patients telemetry and peripheral IV's discontinued without complications. Patients belongings returned. Patient placed in wheel chair and escorted by staff member to pick up location.  Bill RN     

## 2017-08-03 NOTE — Discharge Instructions (Signed)
1) use CPAP every night 2) keep your appointment with your pulmonologist Dr. Elsworth Soho later this month 3) low-salt diet advised 4) take medications as prescribed 5) call if he gains more than 3 pounds in 1 day or more than 5 pounds in 1 week as you may need your furosemide dose changed if you  gain more fluid/weight

## 2017-08-05 ENCOUNTER — Telehealth: Payer: Self-pay | Admitting: *Deleted

## 2017-08-05 LAB — CULTURE, BLOOD (ROUTINE X 2)
CULTURE: NO GROWTH
Culture: NO GROWTH
SPECIAL REQUESTS: ADEQUATE
Special Requests: ADEQUATE

## 2017-08-05 NOTE — Telephone Encounter (Signed)
Lm requesting return call to complete TCM and confirm hosp f/u appt  

## 2017-08-06 ENCOUNTER — Encounter: Payer: Self-pay | Admitting: Family Medicine

## 2017-08-06 ENCOUNTER — Telehealth: Payer: Self-pay

## 2017-08-06 NOTE — Telephone Encounter (Signed)
Transition Care Management Follow-up Telephone Call   Date discharged? 08/03/2017   How have you been since you were released from the hospital? "much better"   Do you understand why you were in the hospital? Yes; CHF and pneumonia   Do you understand the discharge instructions? yes   Where were you discharged to? home   Items Reviewed:  Medications reviewed: yes  Allergies reviewed: yes  Dietary changes reviewed: yes  Referrals reviewed: yes   Functional Questionnaire:   Activities of Daily Living (ADLs):   He states they are independent in the following: in all areas    Any transportation issues/concerns?: no   Any patient concerns? no   Confirmed importance and date/time of follow-up visits scheduled yes  Provider Appointment booked with Dr [name] 5/13  Confirmed with patient if condition begins to worsen call PCP or go to the ER.  Patient was given the office number and encouraged to call back with question or concerns.  : yes

## 2017-08-06 NOTE — Telephone Encounter (Signed)
See additional phone note. 

## 2017-08-06 NOTE — Telephone Encounter (Signed)
Patient called back to do TCM call. Thank Edrick Kins, RMA

## 2017-08-07 ENCOUNTER — Encounter: Payer: Self-pay | Admitting: Endocrinology

## 2017-08-09 ENCOUNTER — Ambulatory Visit (INDEPENDENT_AMBULATORY_CARE_PROVIDER_SITE_OTHER): Payer: Medicare Other | Admitting: Pulmonary Disease

## 2017-08-09 ENCOUNTER — Encounter: Payer: Self-pay | Admitting: Pulmonary Disease

## 2017-08-09 DIAGNOSIS — G4733 Obstructive sleep apnea (adult) (pediatric): Secondary | ICD-10-CM

## 2017-08-09 DIAGNOSIS — I5032 Chronic diastolic (congestive) heart failure: Secondary | ICD-10-CM | POA: Diagnosis not present

## 2017-08-09 DIAGNOSIS — I6501 Occlusion and stenosis of right vertebral artery: Secondary | ICD-10-CM

## 2017-08-09 NOTE — Assessment & Plan Note (Signed)
Continue Lasix 

## 2017-08-09 NOTE — Addendum Note (Signed)
Addended by: Valerie Salts on: 08/09/2017 11:26 AM   Modules accepted: Orders

## 2017-08-09 NOTE — Patient Instructions (Signed)
BiPAP is working well. Supplies will be renewed for a year You are recovering well from episode of bronchitis/congestive heart failure  Call us as needed

## 2017-08-09 NOTE — Assessment & Plan Note (Signed)
BiPAP current settings seem to be working well 11/7 with minimal leak and excellent compliance  Weight loss encouraged, compliance with goal of at least 4-6 hrs every night is the expectation. Advised against medications with sedative side effects Cautioned against driving when sleepy - understanding that sleepiness will vary on a day to day basis

## 2017-08-09 NOTE — Progress Notes (Signed)
   Subjective:    Patient ID: [name], male    DOB: Jan 21, 1946, 72 y.o.   MRN: [id number]  HPI  72 year old man for follow-up of  OSA and RLS  He could not tolerate a CPAP, hence has been maintained on BiPAP 11/7 since 2015 with excellent results-improvement in his daytime fatigue and somnolence.  He was hospitalized 4/35/2 for symptoms of congestive heart failure and "pneumonia".  He was treated with Levaquin and diuresed with Lasix about 10 pounds, he feels about 60% better.  Still has a cough with minimal clear sputum.  Wheezing has resolved.  He required bronchodilator treatments in the hospital which really helped I reviewed 2 chest x-rays done in the hospital which show bilateral interstitial infiltrates without clear consolidation  Atrial fibrillation is well controlled on acebutalol & apixaban  Symptoms of restless legs are well controlled on Requip  He remains compliant with his BiPAP.  No problems with mask or pressure.  No dryness of nasal passages BiPAP download was reviewed which shows excellent control of events on 11/7 cm with minimal leak and excellent compliance Significant tests/ events  NPSG 02/2012: AHI 91/hr.  Auto 2014: Optimal pressure 15cm Poor CPAP tolerance Bilevel titration study 05/2013: 11/7cm, +++limb movements with 7/hr with arousal or awakening.   PFT  08/07/16: FVC 3.27 L (75%) FEV1 2.82 L (88%) FEV1/FVC 0.86  negative bronchodilator response TLC 5.36 L (77%) DLCO uncorrected 53%  10/02/11: FVC 3.53 L (76%) FEV1 2.96 L (86%) FEV1/FVC 0.84 FEF 25-75 3.38 L (124%) negative bronchodilator response TLC 6.73 L (95%) RV 120%                DLCO uncorrected 64%  Review of Systems neg for any significant sore throat, dysphagia, itching, sneezing, nasal congestion or excess/ purulent secretions, fever, chills, sweats, unintended wt loss, pleuritic or exertional cp, hempoptysis, orthopnea pnd or change in chronic leg swelling. Also denies presyncope,  palpitations, heartburn, abdominal pain, nausea, vomiting, diarrhea or change in bowel or urinary habits, dysuria,hematuria, rash, arthralgias, visual complaints, headache, numbness weakness or ataxia.     Objective:   Physical Exam  Gen. Pleasant, obese, in no distress ENT - no lesions, no post nasal drip Neck: No JVD, no thyromegaly, no carotid bruits Lungs: no use of accessory muscles, no dullness to percussion, decreased without rales or rhonchi  Cardiovascular: Rhythm regular, heart sounds  normal, no murmurs or gallops, no peripheral edema Musculoskeletal: No deformities, no cyanosis or clubbing , no tremors       Assessment & Plan:

## 2017-08-12 ENCOUNTER — Ambulatory Visit (INDEPENDENT_AMBULATORY_CARE_PROVIDER_SITE_OTHER): Payer: Medicare Other | Admitting: Family Medicine

## 2017-08-12 ENCOUNTER — Encounter: Payer: Self-pay | Admitting: Family Medicine

## 2017-08-12 VITALS — BP 136/72 | HR 66 | Temp 97.8°F | Ht 69.0 in | Wt 280.8 lb

## 2017-08-12 DIAGNOSIS — G4733 Obstructive sleep apnea (adult) (pediatric): Secondary | ICD-10-CM

## 2017-08-12 DIAGNOSIS — D638 Anemia in other chronic diseases classified elsewhere: Secondary | ICD-10-CM

## 2017-08-12 DIAGNOSIS — E119 Type 2 diabetes mellitus without complications: Secondary | ICD-10-CM

## 2017-08-12 DIAGNOSIS — I5033 Acute on chronic diastolic (congestive) heart failure: Secondary | ICD-10-CM

## 2017-08-12 LAB — BASIC METABOLIC PANEL
BUN: 15 mg/dL (ref 6–23)
CO2: 29 mEq/L (ref 19–32)
CREATININE: 0.69 mg/dL (ref 0.40–1.50)
Calcium: 9 mg/dL (ref 8.4–10.5)
Chloride: 104 mEq/L (ref 96–112)
GFR: 120.12 mL/min (ref >60.00)
Glucose, Bld: 141 mg/dL — ABNORMAL HIGH (ref 70–99)
Potassium: 3.4 mEq/L — ABNORMAL LOW (ref 3.5–5.1)
Sodium: 140 mEq/L (ref 135–145)

## 2017-08-12 LAB — CBC WITH DIFFERENTIAL/PLATELET
Basophils Absolute: 0.1 10*3/uL (ref 0.0–0.1)
Basophils Relative: 0.7 % (ref 0.0–3.0)
EOS PCT: 2.2 % (ref 0.0–5.0)
Eosinophils Absolute: 0.3 10*3/uL (ref 0.0–0.7)
HEMATOCRIT: 36.3 % — AB (ref 39.0–52.0)
Hemoglobin: 12.2 g/dL — ABNORMAL LOW (ref 13.0–17.0)
LYMPHS PCT: 11.3 % — AB (ref 12.0–46.0)
Lymphs Abs: 1.3 10*3/uL (ref 0.7–4.0)
MCHC: 33.7 g/dL (ref 30.0–36.0)
MCV: 89.7 fl (ref 78.0–100.0)
MONOS PCT: 4.5 % (ref 3.0–12.0)
Monocytes Absolute: 0.5 10*3/uL (ref 0.1–1.0)
NEUTROS ABS: 9.1 10*3/uL — AB (ref 1.4–7.7)
Neutrophils Relative %: 81.3 % — ABNORMAL HIGH (ref 43.0–77.0)
PLATELETS: 274 10*3/uL (ref 150.0–400.0)
RBC: 4.04 Mil/uL — ABNORMAL LOW (ref 4.22–5.81)
RDW: 13.9 % (ref 11.5–15.5)
WBC: 11.2 10*3/uL — ABNORMAL HIGH (ref 4.0–10.5)

## 2017-08-12 MED ORDER — AMOXICILLIN-POT CLAVULANATE 875-125 MG PO TABS: 1.0000 | ORAL_TABLET | Freq: Two times a day (BID) | ORAL | 0 refills | Status: DC

## 2017-08-12 NOTE — Patient Instructions (Signed)
Go to the lab on the way out.  We'll contact you with your lab report. Start augmentin today and update me if not better.  Take care.  Glad to see you.

## 2017-08-12 NOTE — Progress Notes (Signed)
Discharge Date:  08/03/2017  1) use CPAP every night 2) keep your appointment with your pulmonologist Dr. Elsworth Soho later this month 3) low-salt diet advised 4) take medications as prescribed 5) call if he gains more than 3 pounds in 1 day or more than 5 pounds in 1 week as you may need your furosemide dose changed if you  gain more fluid/weight  Admission Diagnosis  Shortness of breath [R06.02] Acute on chronic diastolic heart failure (HCC) [I50.33] Normochromic normocytic anemia [D64.9] Fever, unspecified fever cause [R50.9] Acute respiratory failure with hypoxia (HCC) [J96.01]  Discharge Diagnosis  Shortness of breath [R06.02] Acute on chronic diastolic heart failure (HCC) [I50.33] Normochromic normocytic anemia [D64.9] Fever, unspecified fever cause [R50.9] Acute respiratory failure with hypoxia (HCC) [J96.01]   Principal Problem:   Acute respiratory failure with hypoxia (HCC) Active Problems:   HYPERTENSION, BENIGN   Morbid (severe) obesity due to excess calories (HCC)   Paroxysmal atrial fibrillation (HCC)   Chronic diastolic heart failure (HCC)   Type 2 diabetes mellitus without complications (HCC)   Obstructive sleep apnea   Acute on chronic diastolic heart failure (HCC)   Fever   Psoriatic arthritis (HCC)   Restrictive lung disease   Pituitary adenoma (HCC)  Acute respiratory failure with hypoxia: -Likely related to acute on chronic diastolic congestive heart failure, OSA, undiagnosed OHS, acute tracheobronchitis versus pneumonic process. -Continue diuresis. -Continue to use BiPAP at nighttime. -Continue antibiotics. -Patient seems to be improving. -08/01/2017: Repeat chest x-ray.  Start patient on oral prednisone.  Low threshold to refer to pulmonary team on discharge.  Low threshold to also proceed with CT scan of the chest with IV contrast to assess for possible underlying chronic interstitial lung disease changes.  Acute on chronic diastolic congestive heart  failure: -Continue IV Lasix 40 mg once daily. -Monitor volume status, and adjust diuretics accordingly. -On discharge, counseled the change in diuretic to torsemide or Bumex.  This is for better pharmacokinetics. -Last echo revealed EF of 55 to 60%, with restrictive physiology. -Monitor renal function closely.  Adjust diuretics use a serum creatinine continues to rise.  Serum creatinine today is 1.09.  We will also monitor electrolytes and correct as deemed necessary.  Possible pneumonia versus acute tracheobronchitis: -Follow work-up in progress. -Continue antibiotics. -Patient's symptoms are improving. -08/01/2017: Continue antibiotics and start oral prednisone.  Paroxysmal atrial fibrillation: -On acebutolol and Eliquis.  -Continue current regimen.    OSA/likely undiagnosed OHS on BiPAP: -Continue BiPAP at nighttime. -Encourage weight loss. -Diet and exercise.  Hypertension- -on lisinopril. -Continue to optimize.    Diabetes mellitus type 2: -Continue to optimize.    Anemia, chronic: -Monitor CBC.  Morbid obesity: -Diet and exercise.  History of pituitary adenoma denies any headache. History of nonobstructive CAD denies any chest pain. On statins and apixaban. History of BPH continue finasteride. History of psoriatic arthritis may have to hold patient's [name] if continue to be having fever.  =============================================== Inpatient course discussed with patient.  See above.  Discussed with patient in detail.  He had more salt intake +/- bronchitis likely leading to admission/sx. He was treated with antibiotics, BiPAP, diuresis.  He feels better from admission but unchanged from discharge.  D/w pt.   Prev CXR: Heart is moderately enlarged. Heterogeneous opacities are stable. No pneumothorax. No pleural effusion. Stable thoracic spine.   His chest clearly feels better- he sounds nasal with voice change and has facial pressure.  No fevers.  No  vomiting. Sugar has been "okay", max 150 today  but that was after eating rice. Minimal cough now, improved from prev. Sinus sx ongoing from prev.  No CP. Not SOB now.  CODE STATUS discussed with patient. HE IS NOT A DNR.  D/W PT.   PMH and SH reviewed  ROS: Per HPI unless specifically indicated in ROS section   Meds, vitals, and allergies reviewed.   GEN: nad, alert and oriented HEENT: mucous membranes moist, tm w/o erythema, nasal exam w/o erythema, clear discharge noted,  OP with cobblestoning, max>frontal sinuses ttp B NECK: supple w/o LA CV: rrr.   PULM: ctab, no inc wob EXT: no edema SKIN: no acute rash

## 2017-08-13 ENCOUNTER — Ambulatory Visit (INDEPENDENT_AMBULATORY_CARE_PROVIDER_SITE_OTHER): Payer: Medicare Other | Admitting: Rheumatology

## 2017-08-13 ENCOUNTER — Encounter: Payer: Self-pay | Admitting: Rheumatology

## 2017-08-13 VITALS — BP 147/84 | HR 71 | Resp 17 | Ht 69.5 in | Wt 279.6 lb

## 2017-08-13 DIAGNOSIS — M19072 Primary osteoarthritis, left ankle and foot: Secondary | ICD-10-CM

## 2017-08-13 DIAGNOSIS — G2581 Restless legs syndrome: Secondary | ICD-10-CM | POA: Diagnosis not present

## 2017-08-13 DIAGNOSIS — M19041 Primary osteoarthritis, right hand: Secondary | ICD-10-CM | POA: Insufficient documentation

## 2017-08-13 DIAGNOSIS — Z8639 Personal history of other endocrine, nutritional and metabolic disease: Secondary | ICD-10-CM | POA: Diagnosis not present

## 2017-08-13 DIAGNOSIS — L4050 Arthropathic psoriasis, unspecified: Secondary | ICD-10-CM | POA: Diagnosis not present

## 2017-08-13 DIAGNOSIS — Z79899 Other long term (current) drug therapy: Secondary | ICD-10-CM

## 2017-08-13 DIAGNOSIS — R600 Localized edema: Secondary | ICD-10-CM

## 2017-08-13 DIAGNOSIS — Z87448 Personal history of other diseases of urinary system: Secondary | ICD-10-CM

## 2017-08-13 DIAGNOSIS — I6501 Occlusion and stenosis of right vertebral artery: Secondary | ICD-10-CM | POA: Diagnosis not present

## 2017-08-13 DIAGNOSIS — M19042 Primary osteoarthritis, left hand: Secondary | ICD-10-CM

## 2017-08-13 DIAGNOSIS — M19071 Primary osteoarthritis, right ankle and foot: Secondary | ICD-10-CM

## 2017-08-13 DIAGNOSIS — Z862 Personal history of diseases of the blood and blood-forming organs and certain disorders involving the immune mechanism: Secondary | ICD-10-CM

## 2017-08-13 DIAGNOSIS — I472 Ventricular tachycardia, unspecified: Secondary | ICD-10-CM

## 2017-08-13 DIAGNOSIS — Z8679 Personal history of other diseases of the circulatory system: Secondary | ICD-10-CM | POA: Diagnosis not present

## 2017-08-13 DIAGNOSIS — L409 Psoriasis, unspecified: Secondary | ICD-10-CM

## 2017-08-13 DIAGNOSIS — Z96653 Presence of artificial knee joint, bilateral: Secondary | ICD-10-CM | POA: Diagnosis not present

## 2017-08-13 DIAGNOSIS — Z8669 Personal history of other diseases of the nervous system and sense organs: Secondary | ICD-10-CM

## 2017-08-14 NOTE — Assessment & Plan Note (Signed)
Recheck CBC today.  See notes on labs.

## 2017-08-14 NOTE — Assessment & Plan Note (Signed)
See above.  Improved in the meantime.  Chest is clear today.  No change in cardiac medications at this point.  Recheck routine labs today.  He does not appear fluid overloaded. The previous possible pneumonia versus tracheobronchitis appears to be treated.  The concern now is that he could have developed a sinus infection.  Discussed with patient about options.  Start Augmentin.  Supportive care otherwise.  Avoid salty foods.  He will update me as needed.

## 2017-08-14 NOTE — Assessment & Plan Note (Signed)
Per pulmonary.  I will defer.  Appreciate the help of all involved.

## 2017-08-14 NOTE — Assessment & Plan Note (Signed)
See above.  No change in meds at this point.

## 2017-09-02 ENCOUNTER — Other Ambulatory Visit (HOSPITAL_COMMUNITY): Payer: Self-pay | Admitting: Neurosurgery

## 2017-09-02 DIAGNOSIS — D352 Benign neoplasm of pituitary gland: Secondary | ICD-10-CM

## 2017-09-06 ENCOUNTER — Other Ambulatory Visit (INDEPENDENT_AMBULATORY_CARE_PROVIDER_SITE_OTHER): Payer: Medicare Other

## 2017-09-06 DIAGNOSIS — D352 Benign neoplasm of pituitary gland: Secondary | ICD-10-CM | POA: Diagnosis not present

## 2017-09-06 LAB — URINALYSIS, ROUTINE W REFLEX MICROSCOPIC
Bilirubin Urine: NEGATIVE
HGB URINE DIPSTICK: NEGATIVE
Ketones, ur: NEGATIVE
LEUKOCYTES UA: NEGATIVE
NITRITE: NEGATIVE
Renal Epithel, UA: NONE SEEN
SPECIFIC GRAVITY, URINE: 1.025 (ref 1.000–1.030)
TOTAL PROTEIN, URINE-UPE24: NEGATIVE
Urine Glucose: NEGATIVE
Urobilinogen, UA: 0.2 (ref 0.0–1.0)
pH: 5.5 (ref 5.0–8.0)

## 2017-09-06 LAB — BASIC METABOLIC PANEL
BUN: 18 mg/dL (ref 6–23)
CALCIUM: 9.3 mg/dL (ref 8.4–10.5)
CO2: 23 mEq/L (ref 19–32)
Chloride: 104 mEq/L (ref 96–112)
Creatinine, Ser: 0.93 mg/dL (ref 0.40–1.50)
GFR: 85.1 mL/min (ref >60.00)
Glucose, Bld: 199 mg/dL — ABNORMAL HIGH (ref 70–99)
POTASSIUM: 4.1 meq/L (ref 3.5–5.1)
SODIUM: 140 meq/L (ref 135–145)

## 2017-09-06 LAB — FOLLICLE STIMULATING HORMONE: FSH: 8.7 m[IU]/mL (ref 1.4–18.1)

## 2017-09-06 LAB — TSH: TSH: 3.49 u[IU]/mL (ref 0.35–4.50)

## 2017-09-06 LAB — LUTEINIZING HORMONE: LH: 4.34 m[IU]/mL (ref 3.10–34.60)

## 2017-09-06 LAB — T4, FREE: FREE T4: 0.85 ng/dL (ref 0.60–1.60)

## 2017-09-06 LAB — CORTISOL: CORTISOL PLASMA: 11.7 ug/dL

## 2017-09-08 LAB — TESTOSTERONE,FREE AND TOTAL
Testosterone, Free: 4.5 pg/mL — ABNORMAL LOW (ref 6.6–18.1)
Testosterone: 126 ng/dL — ABNORMAL LOW (ref 264–916)

## 2017-09-12 LAB — PROLACTIN: Prolactin: 9.3 ng/mL (ref 2.0–18.0)

## 2017-09-12 LAB — ACTH: C206 ACTH: 32 pg/mL (ref 6–50)

## 2017-09-12 LAB — INSULIN-LIKE GROWTH FACTOR
IGF-I, LC/MS: 50 ng/mL (ref 34–245)
Z-Score (Male): -1.4 SD (ref ?–2.0)

## 2017-09-12 LAB — ARGININE VASOPRESSIN HORMONE
ADH: <0.8 pg/mL (ref 0.0–4.7)
Osmolality Meas: 294 mOsmol/kg (ref 280–301)

## 2017-09-20 DIAGNOSIS — F331 Major depressive disorder, recurrent, moderate: Secondary | ICD-10-CM | POA: Diagnosis not present

## 2017-09-23 ENCOUNTER — Other Ambulatory Visit: Payer: Self-pay

## 2017-09-23 ENCOUNTER — Encounter: Payer: Self-pay | Admitting: Endocrinology

## 2017-09-23 ENCOUNTER — Encounter: Payer: Self-pay | Admitting: Internal Medicine

## 2017-09-23 DIAGNOSIS — I498 Other specified cardiac arrhythmias: Secondary | ICD-10-CM

## 2017-09-23 MED ORDER — ACEBUTOLOL HCL 200 MG PO CAPS: 200.0000 mg | ORAL_CAPSULE | Freq: Two times a day (BID) | ORAL | 6 refills | Status: DC

## 2017-09-24 MED ORDER — ACEBUTOLOL HCL 200 MG PO CAPS: 200.0000 mg | ORAL_CAPSULE | Freq: Two times a day (BID) | ORAL | 1 refills | Status: DC

## 2017-09-24 NOTE — Addendum Note (Signed)
Addended by: Derl Barrow on: 09/24/2017 08:16 AM   Modules accepted: Orders

## 2017-09-24 NOTE — Telephone Encounter (Signed)
Pt's medication was sent to pt's pharmacy as requested. Pt requested a 90 day supply. Confirmation received.

## 2017-09-30 ENCOUNTER — Other Ambulatory Visit: Payer: Self-pay | Admitting: *Deleted

## 2017-09-30 MED ORDER — PRAVASTATIN SODIUM 20 MG PO TABS: 20.0000 mg | ORAL_TABLET | Freq: Every evening | ORAL | 2 refills | Status: DC

## 2017-10-01 ENCOUNTER — Ambulatory Visit (HOSPITAL_COMMUNITY)
Admission: RE | Admit: 2017-10-01 | Discharge: 2017-10-01 | Disposition: A | Discharge status: Home / Self Care | Payer: Medicare Other | Source: Ambulatory Visit | Attending: Neurosurgery | Admitting: Neurosurgery

## 2017-10-01 DIAGNOSIS — D352 Benign neoplasm of pituitary gland: Secondary | ICD-10-CM | POA: Diagnosis not present

## 2017-10-01 DIAGNOSIS — I6782 Cerebral ischemia: Secondary | ICD-10-CM | POA: Insufficient documentation

## 2017-10-01 MED ORDER — GADOBENATE DIMEGLUMINE 529 MG/ML IV SOLN
20.0000 mL | Freq: Once | INTRAVENOUS | Status: AC
  Administered 2017-10-01: 20 mL via INTRAVENOUS

## 2017-10-15 ENCOUNTER — Encounter: Payer: Self-pay | Admitting: Family Medicine

## 2017-10-15 ENCOUNTER — Ambulatory Visit (INDEPENDENT_AMBULATORY_CARE_PROVIDER_SITE_OTHER): Payer: Medicare Other | Admitting: Family Medicine

## 2017-10-15 VITALS — BP 142/90 | HR 100 | Temp 98.3°F | Ht 69.5 in | Wt 280.5 lb

## 2017-10-15 DIAGNOSIS — G629 Polyneuropathy, unspecified: Secondary | ICD-10-CM | POA: Diagnosis not present

## 2017-10-15 DIAGNOSIS — E119 Type 2 diabetes mellitus without complications: Secondary | ICD-10-CM | POA: Diagnosis not present

## 2017-10-15 DIAGNOSIS — I6501 Occlusion and stenosis of right vertebral artery: Secondary | ICD-10-CM | POA: Diagnosis not present

## 2017-10-15 DIAGNOSIS — E291 Testicular hypofunction: Secondary | ICD-10-CM

## 2017-10-15 DIAGNOSIS — R202 Paresthesia of skin: Secondary | ICD-10-CM

## 2017-10-15 DIAGNOSIS — E1149 Type 2 diabetes mellitus with other diabetic neurological complication: Secondary | ICD-10-CM

## 2017-10-15 LAB — POCT GLYCOSYLATED HEMOGLOBIN (HGB A1C): HEMOGLOBIN A1C: 6.4 % — AB (ref 4.0–5.6)

## 2017-10-15 LAB — VITAMIN B12: Vitamin B-12: 128 pg/mL — ABNORMAL LOW (ref 211–911)

## 2017-10-15 NOTE — Patient Instructions (Addendum)
Go to the lab on the way out.  We'll contact you with your lab report. I'll ask Loanne Drilling for input.  Recheck in about 3 months, labs at the visit (A1c) Take care.  Glad to see you.

## 2017-10-15 NOTE — Progress Notes (Signed)
His arthritis seems to be some better compared to 07/2017.  I'll defer.    He has low T noted, per endocrine.  He has longstanding ED.  Discussed.  He was asking about the risk and benefit with testosterone replacement therapy.  I will defer to endocrine and I will ask for input.  We talked about the routine risks and benefits of treatment.  He has neurosurgery f/u pending. I'll defer.  He agrees.    Diabetes:  Using medications without difficulties:yes Hypoglycemic episodes: no Hyperglycemic episodes: no Feet problems: see below.   Blood Sugars averaging: usually ~110-160s, occ higher after occ ice cream (he limits that) eye exam within last year: yes A1c d/w pt.  At goal 6.4.   He has some tingling burning in the legs and feet, bottoms of the feet, sometimes the whole foot, up to the knees episodically.  Bilateral and symmetric.  Is some better now at time of OV, noted in the last few months.  Constant in the feet, occ higher up.  No weakness.   No hand sx.  No radicular sx.    PMH and SH reviewed  Meds, vitals, and allergies reviewed.   ROS: Per HPI unless specifically indicated in ROS section   GEN: nad, alert and oriented HEENT: mucous membranes moist NECK: supple w/o LA CV: rrr. PULM: ctab, no inc wob ABD: soft, +bs EXT: no edema SKIN: well perfused.    Diabetic foot exam: Normal inspection No skin breakdown No calluses  Normal DP pulses Normal sensation to light touch and monofilament Nails normal

## 2017-10-16 ENCOUNTER — Telehealth: Payer: Self-pay | Admitting: Family Medicine

## 2017-10-16 ENCOUNTER — Telehealth: Payer: Self-pay | Admitting: Endocrinology

## 2017-10-16 DIAGNOSIS — E291 Testicular hypofunction: Secondary | ICD-10-CM | POA: Insufficient documentation

## 2017-10-16 DIAGNOSIS — G629 Polyneuropathy, unspecified: Secondary | ICD-10-CM | POA: Insufficient documentation

## 2017-10-16 NOTE — Telephone Encounter (Signed)
I need input from Dr. Loanne Drilling.  Patient has ED.  He was asking about the risk and benefit with testosterone replacement therapy.  Pt would like your input on risk versus benefit and rationale for treatment.  He was leaning towards opting out of treatment.   Please contact or discuss with patient.  Many thanks.  [name]

## 2017-10-16 NOTE — Assessment & Plan Note (Signed)
He has low T noted, per endocrine.  He has longstanding ED.  Discussed.  He was asking about the risk and benefit with testosterone replacement therapy.  I will defer to endocrine and I will ask for input.  We talked about the routine risks and benefits of treatment.  Patient is leaning towards opting out of treatment.

## 2017-10-16 NOTE — Telephone Encounter (Signed)
please call patient: My apologies that it looks like your last message was not answered.  The advantages to taking the pill to increase the testosterone are that some men have improvement in symptoms, such as ED symptoms.  The downsides are or decreased fertility (depending on the type of treatment), hair loss, prostate cancer, benign prostate enlargement, blood clots, liver problems, lower hdl ("good cholesterol"), polycythemia (opposite of anemia), sleep apnea, and behavior changes. These vary from 1 person to the other.  Whatever you decide is fine.  You could try it and see how it goes.  We are always happy to see you back in the office, too.

## 2017-10-16 NOTE — Telephone Encounter (Signed)
[name]  I can't find out why his last message was not answered.  We'll get back to him.  Sorry about that.    Hilliard Clark

## 2017-10-16 NOTE — Telephone Encounter (Signed)
I called patient & he stated that he would send a mychart message stating if he wanted to take pill. He needed more time to decide.

## 2017-10-16 NOTE — Assessment & Plan Note (Signed)
We will check B12 level today.  Unclear if related to diabetes.  It could be idiopathic.  Symmetric.  Normal foot exam today.  Routine cautions given.  See notes on labs.

## 2017-10-16 NOTE — Assessment & Plan Note (Signed)
He has neuropathy symptoms though his foot exam is unremarkable today.  He has normal monofilament sensation.  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  Discussed with him about differential.  It could be related to diabetes.  He could have a B12 deficiency.  He has had an extensive work-up otherwise.  We talked about the fact that some episodes of neuropathy are idiopathic.  Check B12 level today.  A1c is reasonable at 6.4.  We talked about diet adherence and trying to limit very high carbohydrate foods.  Recheck in about 3 months.  No change in medication at this point.  He agrees.

## 2017-10-18 DIAGNOSIS — Z6841 Body Mass Index (BMI) 40.0 and over, adult: Secondary | ICD-10-CM | POA: Diagnosis not present

## 2017-10-18 DIAGNOSIS — D352 Benign neoplasm of pituitary gland: Secondary | ICD-10-CM | POA: Diagnosis not present

## 2017-10-18 DIAGNOSIS — I10 Essential (primary) hypertension: Secondary | ICD-10-CM | POA: Diagnosis not present

## 2017-11-07 DIAGNOSIS — N200 Calculus of kidney: Secondary | ICD-10-CM | POA: Diagnosis not present

## 2017-11-11 ENCOUNTER — Encounter: Payer: Self-pay | Admitting: Family Medicine

## 2017-11-13 ENCOUNTER — Telehealth: Payer: Self-pay | Admitting: Family Medicine

## 2017-11-13 DIAGNOSIS — E538 Deficiency of other specified B group vitamins: Secondary | ICD-10-CM | POA: Insufficient documentation

## 2017-11-13 NOTE — Telephone Encounter (Signed)
I added the dx.  I put in the order for the f/u B12 level in ~4 months, to be done prior to (but on the same day as) that month's injection.  Thanks.  Please have the person doing the nurse visit notify the patient about the f/u lab order.

## 2017-11-13 NOTE — Telephone Encounter (Signed)
Please call patient.  I tried to call him a few minutes ago and left him a Advertising account executive.  He sent a my chart message asking about vitamin B12.  I made a mistake in terms of follow-up of his B12 level.  It was apparently drawn at the last office visit and I mistakenly thought he had a relatively quick follow-up in the office where we would discuss it at that point.  That was not the case.  I apologize to the patient and I appreciate him bringing this to my attention.  His issue with a low B12 is correctable.  It may be the case that some of his symptoms would improve with B12 treatment.  His symptoms may not completely resolve, but it is worth treating.  We should get him set up with 1000 mcg IM injection once a week for 4 doses and then monthly injections thereafter with a repeat level in a few months.  Please find out if he wants to do the injection here or if he wants to do it at home.  I am okay with either, assuming he is okay with one or the other.  I can put in the orders and the follow-up labs when I get input from the patient.  If he needs training to self administer the injection then let me know so we can set that up.  Again I apologize.  This was my fault and I will work to keep this kind of issue from happening in the future.  Thanks.

## 2017-11-13 NOTE — Telephone Encounter (Signed)
Patient notified as instructed by telephone and verbalized understanding. Patient stated that everything is fine and he appreciates the great doctor that Dr. [name] is. Patient stated that he wants to get the shots at the office. Appointment scheduled for first injection tomorrow 11/14/17. Medication added to medication list.   Please add diagnosis.

## 2017-11-14 ENCOUNTER — Ambulatory Visit (INDEPENDENT_AMBULATORY_CARE_PROVIDER_SITE_OTHER): Payer: Medicare Other | Admitting: *Deleted

## 2017-11-14 DIAGNOSIS — E538 Deficiency of other specified B group vitamins: Secondary | ICD-10-CM

## 2017-11-14 MED ORDER — CYANOCOBALAMIN 1000 MCG/ML IJ SOLN
1000.0000 ug | Freq: Once | INTRAMUSCULAR | Status: AC
  Administered 2017-11-14: 1000 ug via INTRAMUSCULAR

## 2017-11-14 NOTE — Telephone Encounter (Signed)
Instructions not received. Reviewed via chart; pt advised

## 2017-11-14 NOTE — Telephone Encounter (Signed)
Instructions printed and Dr. [name] gave them to [facility] who is doing nurse visits 11/14/17.

## 2017-11-14 NOTE — Progress Notes (Signed)
Per orders of Dr. Duncan, injection of b12 given by Hayat Warbington H. Patient tolerated injection well.  

## 2017-11-21 ENCOUNTER — Other Ambulatory Visit: Payer: Self-pay | Admitting: *Deleted

## 2017-11-21 ENCOUNTER — Ambulatory Visit (INDEPENDENT_AMBULATORY_CARE_PROVIDER_SITE_OTHER): Payer: Medicare Other | Admitting: *Deleted

## 2017-11-21 DIAGNOSIS — E538 Deficiency of other specified B group vitamins: Secondary | ICD-10-CM

## 2017-11-21 MED ORDER — CYANOCOBALAMIN 1000 MCG/ML IJ SOLN
1000.0000 ug | Freq: Once | INTRAMUSCULAR | Status: AC
  Administered 2017-11-21: 1000 ug via INTRAMUSCULAR

## 2017-11-21 MED ORDER — GLIPIZIDE 5 MG PO TABS: 5.0000 mg | ORAL_TABLET | Freq: Every day | ORAL | 1 refills | Status: DC

## 2017-11-21 NOTE — Progress Notes (Signed)
Per orders of Dr. Duncan, injection of B12 1000 mcg/mL  given by Daruis Swaim. Patient tolerated injection well.  

## 2017-11-28 ENCOUNTER — Ambulatory Visit (INDEPENDENT_AMBULATORY_CARE_PROVIDER_SITE_OTHER): Payer: Medicare Other | Admitting: *Deleted

## 2017-11-28 DIAGNOSIS — E538 Deficiency of other specified B group vitamins: Secondary | ICD-10-CM

## 2017-11-28 MED ORDER — CYANOCOBALAMIN 1000 MCG/ML IJ SOLN
1000.0000 ug | Freq: Once | INTRAMUSCULAR | Status: AC
  Administered 2017-11-28: 1000 ug via INTRAMUSCULAR

## 2017-11-28 NOTE — Progress Notes (Signed)
Per orders of Dr. Monna Fam, injection of B12 1000 mcg/mL  given by Mattilynn Forrer. Patient tolerated injection well.

## 2017-11-29 ENCOUNTER — Other Ambulatory Visit: Payer: Self-pay | Admitting: Family Medicine

## 2017-11-29 ENCOUNTER — Other Ambulatory Visit: Payer: Self-pay | Admitting: *Deleted

## 2017-11-29 MED ORDER — GLUCOSE BLOOD VI STRP: ORAL_STRIP | 3 refills | Status: DC

## 2017-11-29 NOTE — Addendum Note (Signed)
Addended by: Josetta Huddle on: 11/29/2017 04:45 PM   Modules accepted: Orders

## 2017-12-05 ENCOUNTER — Ambulatory Visit: Payer: Medicare Other
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
## 2017-12-09 ENCOUNTER — Ambulatory Visit (INDEPENDENT_AMBULATORY_CARE_PROVIDER_SITE_OTHER): Payer: Medicare Other | Admitting: Adult Health

## 2017-12-09 ENCOUNTER — Other Ambulatory Visit: Payer: Self-pay | Admitting: Rheumatology

## 2017-12-09 ENCOUNTER — Encounter: Payer: Self-pay | Admitting: Adult Health

## 2017-12-09 VITALS — BP 142/67 | HR 78 | Ht 69.5 in | Wt 281.4 lb

## 2017-12-09 DIAGNOSIS — I10 Essential (primary) hypertension: Secondary | ICD-10-CM

## 2017-12-09 DIAGNOSIS — E785 Hyperlipidemia, unspecified: Secondary | ICD-10-CM | POA: Diagnosis not present

## 2017-12-09 DIAGNOSIS — I480 Paroxysmal atrial fibrillation: Secondary | ICD-10-CM | POA: Diagnosis not present

## 2017-12-09 DIAGNOSIS — E1149 Type 2 diabetes mellitus with other diabetic neurological complication: Secondary | ICD-10-CM | POA: Diagnosis not present

## 2017-12-09 DIAGNOSIS — G459 Transient cerebral ischemic attack, unspecified: Secondary | ICD-10-CM

## 2017-12-09 NOTE — Telephone Encounter (Signed)
Last Visit: 08/13/17 Next Visit: 02/11/18  Okay to refill per Dr. Estanislado Pandy

## 2017-12-09 NOTE — Patient Instructions (Signed)
Continue Eliquis (apixaban) daily  and pravastatin  for secondary stroke prevention  Continue to follow up with PCP regarding cholesterol, blood pressure, and diabetes management   Continue to follow up with Dr. Lovena Le for atrial fibrillation management  Continue to stay active and maintain a healthy diet  Continue use of BiPAP for sleep apnea  Continue to monitor blood pressure at home  Maintain strict control of hypertension with blood pressure goal below 130/90, diabetes with hemoglobin A1c goal below 6.5% and cholesterol with LDL cholesterol (bad cholesterol) goal below 70 mg/dL. I also advised the patient to eat a healthy diet with plenty of whole grains, cereals, fruits and vegetables, exercise regularly and maintain ideal body weight.  Followup in the future with me as needed or call earlier if needed       Thank you for coming to see [place] at [facility]. I hope we have been able to provide you high quality care today.  You may receive a patient satisfaction survey over the next few weeks. We would appreciate your feedback and comments so that we may continue to improve ourselves and the health of our patients.

## 2017-12-10 ENCOUNTER — Ambulatory Visit (INDEPENDENT_AMBULATORY_CARE_PROVIDER_SITE_OTHER): Payer: Medicare Other | Admitting: Emergency Medicine

## 2017-12-10 DIAGNOSIS — E538 Deficiency of other specified B group vitamins: Secondary | ICD-10-CM | POA: Diagnosis not present

## 2017-12-10 MED ORDER — CYANOCOBALAMIN 1000 MCG/ML IJ SOLN
1000.0000 ug | Freq: Once | INTRAMUSCULAR | Status: AC
  Administered 2017-12-10: 1000 ug via INTRAMUSCULAR

## 2017-12-10 NOTE — Progress Notes (Signed)
Per orders of Dr.Duncan, injection of b12 given by Elmon Kirschner. Patient tolerated injection well.Recieved in right deltoid.

## 2017-12-13 DIAGNOSIS — F331 Major depressive disorder, recurrent, moderate: Secondary | ICD-10-CM | POA: Diagnosis not present

## 2017-12-15 NOTE — Progress Notes (Signed)
I agree with the above plan 

## 2018-01-03 ENCOUNTER — Other Ambulatory Visit: Payer: Self-pay

## 2018-01-03 MED ORDER — FLUOXETINE HCL 10 MG PO TABS: 30.0000 mg | ORAL_TABLET | Freq: Every morning | ORAL | 1 refills | Status: DC

## 2018-01-13 ENCOUNTER — Ambulatory Visit: Payer: Medicare Other | Admitting: Rheumatology

## 2018-01-17 ENCOUNTER — Telehealth: Payer: Self-pay | Admitting: Pharmacy Technician

## 2018-01-17 NOTE — Telephone Encounter (Signed)
Received fax from [place] support stating patient was mailed renewal application. Called patient, left message to follow up and advised patient to contact office with any questions.  9:24 AM Beatriz Chancellor, CPhT

## 2018-01-19 ENCOUNTER — Other Ambulatory Visit: Payer: Self-pay | Admitting: Family Medicine

## 2018-01-27 ENCOUNTER — Ambulatory Visit (INDEPENDENT_AMBULATORY_CARE_PROVIDER_SITE_OTHER): Payer: Medicare Other | Admitting: Family Medicine

## 2018-01-27 ENCOUNTER — Ambulatory Visit (INDEPENDENT_AMBULATORY_CARE_PROVIDER_SITE_OTHER): Payer: Medicare Other

## 2018-01-27 ENCOUNTER — Other Ambulatory Visit: Payer: Self-pay | Admitting: Family Medicine

## 2018-01-27 ENCOUNTER — Encounter: Payer: Self-pay | Admitting: Family Medicine

## 2018-01-27 VITALS — BP 102/70 | HR 42 | Temp 97.7°F | Ht 69.0 in | Wt 280.5 lb

## 2018-01-27 DIAGNOSIS — I480 Paroxysmal atrial fibrillation: Secondary | ICD-10-CM

## 2018-01-27 DIAGNOSIS — Z0000 Encounter for general adult medical examination without abnormal findings: Secondary | ICD-10-CM | POA: Diagnosis not present

## 2018-01-27 DIAGNOSIS — G2581 Restless legs syndrome: Secondary | ICD-10-CM | POA: Diagnosis not present

## 2018-01-27 DIAGNOSIS — E538 Deficiency of other specified B group vitamins: Secondary | ICD-10-CM

## 2018-01-27 DIAGNOSIS — I6501 Occlusion and stenosis of right vertebral artery: Secondary | ICD-10-CM | POA: Diagnosis not present

## 2018-01-27 DIAGNOSIS — E559 Vitamin D deficiency, unspecified: Secondary | ICD-10-CM

## 2018-01-27 DIAGNOSIS — E785 Hyperlipidemia, unspecified: Secondary | ICD-10-CM | POA: Diagnosis not present

## 2018-01-27 DIAGNOSIS — Z7189 Other specified counseling: Secondary | ICD-10-CM

## 2018-01-27 DIAGNOSIS — E1149 Type 2 diabetes mellitus with other diabetic neurological complication: Secondary | ICD-10-CM

## 2018-01-27 DIAGNOSIS — D352 Benign neoplasm of pituitary gland: Secondary | ICD-10-CM

## 2018-01-27 DIAGNOSIS — Z23 Encounter for immunization: Secondary | ICD-10-CM

## 2018-01-27 DIAGNOSIS — R4586 Emotional lability: Secondary | ICD-10-CM

## 2018-01-27 LAB — LIPID PANEL
CHOLESTEROL: 136 mg/dL (ref 0–200)
HDL: 31.2 mg/dL — ABNORMAL LOW (ref >39.00)
LDL Cholesterol: 71 mg/dL (ref 0–99)
NONHDL: 104.69
Total CHOL/HDL Ratio: 4
Triglycerides: 170 mg/dL — ABNORMAL HIGH (ref 0.0–149.0)
VLDL: 34 mg/dL (ref 0.0–40.0)

## 2018-01-27 LAB — CBC WITH DIFFERENTIAL/PLATELET
BASOS PCT: 0.8 % (ref 0.0–3.0)
Basophils Absolute: 0.1 10*3/uL (ref 0.0–0.1)
Eosinophils Absolute: 0.3 10*3/uL (ref 0.0–0.7)
Eosinophils Relative: 4.7 % (ref 0.0–5.0)
HCT: 35 % — ABNORMAL LOW (ref 39.0–52.0)
Hemoglobin: 12.2 g/dL — ABNORMAL LOW (ref 13.0–17.0)
LYMPHS ABS: 1.5 10*3/uL (ref 0.7–4.0)
Lymphocytes Relative: 20.1 % (ref 12.0–46.0)
MCHC: 34.8 g/dL (ref 30.0–36.0)
MCV: 91.8 fl (ref 78.0–100.0)
MONO ABS: 0.5 10*3/uL (ref 0.1–1.0)
Monocytes Relative: 7.2 % (ref 3.0–12.0)
NEUTROS ABS: 4.9 10*3/uL (ref 1.4–7.7)
NEUTROS PCT: 67.2 % (ref 43.0–77.0)
Platelets: 202 10*3/uL (ref 150.0–400.0)
RBC: 3.81 Mil/uL — AB (ref 4.22–5.81)
RDW: 13.9 % (ref 11.5–15.5)
WBC: 7.2 10*3/uL (ref 4.0–10.5)

## 2018-01-27 LAB — VITAMIN D 25 HYDROXY (VIT D DEFICIENCY, FRACTURES): VITD: 51.96 ng/mL (ref 30.00–100.00)

## 2018-01-27 LAB — VITAMIN B12: VITAMIN B 12: 199 pg/mL — AB (ref 211–911)

## 2018-01-27 LAB — COMPREHENSIVE METABOLIC PANEL
ALK PHOS: 85 U/L (ref 39–117)
ALT: 12 U/L (ref 0–53)
AST: 12 U/L (ref 0–37)
Albumin: 4 g/dL (ref 3.5–5.2)
BUN: 18 mg/dL (ref 6–23)
CHLORIDE: 104 meq/L (ref 96–112)
CO2: 26 mEq/L (ref 19–32)
CREATININE: 0.96 mg/dL (ref 0.40–1.50)
Calcium: 9.2 mg/dL (ref 8.4–10.5)
GFR: 81.95 mL/min (ref >60.00)
GLUCOSE: 152 mg/dL — AB (ref 70–99)
POTASSIUM: 4.3 meq/L (ref 3.5–5.1)
SODIUM: 139 meq/L (ref 135–145)
TOTAL PROTEIN: 7.3 g/dL (ref 6.0–8.3)
Total Bilirubin: 0.8 mg/dL (ref 0.2–1.2)

## 2018-01-27 LAB — HEMOGLOBIN A1C: Hgb A1c MFr Bld: 6.2 % (ref 4.6–6.5)

## 2018-01-27 MED ORDER — CYANOCOBALAMIN 1000 MCG/ML IJ SOLN
1000.0000 ug | Freq: Once | INTRAMUSCULAR | Status: AC
  Administered 2018-01-27: 1000 ug via INTRAMUSCULAR

## 2018-01-27 MED ORDER — CYANOCOBALAMIN 1000 MCG/ML IJ SOLN: 1000.0000 ug | INTRAMUSCULAR | Status: DC
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
## 2018-01-27 NOTE — Progress Notes (Signed)
Diabetes:  Using medications without difficulties: yes, with splitting metformin into BID dosing.   Hypoglycemic episodes: no Hyperglycemic episodes:no Feet problems: tingling in the feet noted, see below.   Blood Sugars averaging: 120-130s eye exam within last year:yes  Elevated Cholesterol: Using medications without problems:yes Muscle aches: no Diet compliance: encouraged. Exercise: encouraged.    RLS and OSA.  Bipap and Requip help.  Compliant.  Still with burning and stinging in the legs, worse at night.  Noted some in the day.    AF.  He has some days with lower pulse rate, occ higher.  Not lightheaded.  No CP, BLE edema.  Some SOB as expected going up flights of steps but not a few steps, not with regular everyday activity.  He can call about cards f/u.  D/w pt.  Taking lasix daily.    Cath 2018 with  1. Nonobstructive CAD 2. Low normal LV systolic function 3. Upper normal pulmonary artery pressure. 4. Normal LVEDP 5. Normal cardiac output. Plan: medical therapy.  Mood d/w pt.  Still seeing dr. Clovis Pu, now on 30mg  prozac with relief- less irritable on higher dose of med.  D/w pt.  No SI/HI.  Compliant.    B12 def on replacement, monthly.  Labs pending. See above re: foot sx.    Declined hearing aids, d/w pt.  Flu shot 2019 [facility] UTD Shingles out of stock.  Tetanus may be cheaper at pharmacy, d/w pt.  Advance directive- wife designated if patient were incapacitated.  D/w patient [id number] for colon cancer screening, including IFOB vs. colonoscopy.  Risks and benefits of both were discussed and patient voiced understanding.  Pt elects for: cologuard.   PSA per urology, d/w pt.    Pituitary per outside clinic.  He has routine yearly f/u.  No new sx.  No tunnel vision.   PMH and SH reviewed  Meds, vitals, and allergies reviewed.   ROS: Per HPI unless specifically indicated in ROS section   GEN: nad, alert and oriented HEENT: mucous membranes moist NECK: supple w/o  LA CV: IRR, recheck pulse ~60 on MD exam.  PULM: ctab, no inc wob ABD: soft, +bs EXT: no edema SKIN: no acute rash  Diabetic foot exam: Normal inspection No skin breakdown No calluses  Normal DP pulses Normal sensation to light touch and monofilament Nails normal

## 2018-01-27 NOTE — Patient Instructions (Addendum)
We'll contact you with your lab report. You should get a call or a package about cologuard.  Update [place] if no delivery in 2 weeks.   Take care.  Glad to see you.  Plan on recheck 6 months.   Call cardiology in the meantime.   Let the pharmacy know if you need diabetic supplies or refills.

## 2018-01-27 NOTE — Progress Notes (Signed)
PCP notes:   Health maintenance:  Colonoscopy - PCP follow-up needed Tetanus vaccine - postponed/insurance A1C - completed Flu vaccine - administered  Abnormal screenings:   Hearing - failed  Hearing Screening   125Hz  250Hz  500Hz  1000Hz  2000Hz  3000Hz  4000Hz  6000Hz  8000Hz   Right ear:   40 40 40  0    Left ear:   40 40 40  0     Fall risk - hx of single fall Fall Risk  01/27/2018 12/09/2017 01/31/2017 09/29/2015 07/26/2014  Falls in the past year? Yes No No No Yes  Comment fall after tripping over hose in yard - - - -  Number falls in past yr: 1 - - - 2 or more  Injury with Fall? No - - - Yes    Patient concerns:   None  Nurse concerns:  B12 injection administered as ordered.   Next PCP appt:   01/27/18 @ 1045  I reviewed health advisor's note, was available for consultation on the day of service listed in this note, and agree with documentation and plan. [name], MD.

## 2018-01-27 NOTE — Patient Instructions (Addendum)
[name] , Thank you for taking time to come for your Medicare Wellness Visit. I appreciate your ongoing commitment to your health goals. Please review the following plan we discussed and let me know if I can assist you in the future.   These are the goals we discussed: Goals    . Increase physical activity     Starting 01/27/2018, I will continue to walk at least 15 minutes daily.        This is a list of the screening recommended for you and due dates:  Health Maintenance  Topic Date Due  . Colon Cancer Screening  04/02/2019*  . Tetanus Vaccine  04/02/2019*  . Eye exam for diabetics  03/13/2018  . Hemoglobin A1C  07/29/2018  . Complete foot exam   10/16/2018  . Flu Shot  Completed  .  Hepatitis C: One time screening is recommended by Center for Disease Control  (CDC) for  adults born from [birth year] through 1964.   Completed  . Pneumonia vaccines  Completed  *Topic was postponed. The date shown is not the original due date.       Preventive Care for Adults  A healthy lifestyle and preventive care can promote health and wellness. Preventive health guidelines for adults include the following key practices.  . A routine yearly physical is a good way to check with your health care provider about your health and preventive screening. It is a chance to share any concerns and updates on your health and to receive a thorough exam.  . Visit your dentist for a routine exam and preventive care every 6 months. Brush your teeth twice a day and floss once a day. Good oral hygiene prevents tooth decay and gum disease.  . The frequency of eye exams is based on your age, health, family medical history, use  of contact lenses, and other factors. Follow your health care provider's recommendations for frequency of eye exams.  . Eat a healthy diet. Foods like vegetables, fruits, whole grains, low-fat dairy products, and lean protein foods contain the nutrients you need without too many calories.  Decrease your intake of foods high in solid fats, added sugars, and salt. Eat the right amount of calories for you. Get information about a proper diet from your health care provider, if necessary.  . Regular physical exercise is one of the most important things you can do for your health. Most adults should get at least 150 minutes of moderate-intensity exercise (any activity that increases your heart rate and causes you to sweat) each week. In addition, most adults need muscle-strengthening exercises on 2 or more days a week.  Silver Sneakers may be a benefit available to you. To determine eligibility, you may visit the website: www.silversneakers.com or contact program at 531 043 0062 Mon-Fri between 8AM-8PM.   . Maintain a healthy weight. The body mass index (BMI) is a screening tool to identify possible weight problems. It provides an estimate of body fat based on height and weight. Your health care provider can find your BMI and can help you achieve or maintain a healthy weight.   For adults 20 years and older: ? A BMI below 18.5 is considered underweight. ? A BMI of 18.5 to 24.9 is normal. ? A BMI of 25 to 29.9 is considered overweight. ? A BMI of 30 and above is considered obese.   . Maintain normal blood lipids and cholesterol levels by exercising and minimizing your intake of saturated fat. Eat a balanced diet  with plenty of fruit and vegetables. Blood tests for lipids and cholesterol should begin at age 65 and be repeated every 5 years. If your lipid or cholesterol levels are high, you are over 50, or you are at high risk for heart disease, you may need your cholesterol levels checked more frequently. Ongoing high lipid and cholesterol levels should be treated with medicines if diet and exercise are not working.  . If you smoke, find out from your health care provider how to quit. If you do not use tobacco, please do not start.  . If you choose to drink alcohol, please do not consume  more than 2 drinks per day. One drink is considered to be 12 ounces (355 mL) of beer, 5 ounces (148 mL) of wine, or 1.5 ounces (44 mL) of liquor.  . If you are 101-35 years old, ask your health care provider if you should take aspirin to prevent strokes.  . Use sunscreen. Apply sunscreen liberally and repeatedly throughout the day. You should seek shade when your shadow is shorter than you. Protect yourself by wearing long sleeves, pants, a wide-brimmed hat, and sunglasses year round, whenever you are outdoors.  . Once a month, do a whole body skin exam, using a mirror to look at the skin on your back. Tell your health care provider of new moles, moles that have irregular borders, moles that are larger than a pencil eraser, or moles that have changed in shape or color.
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
## 2018-01-30 ENCOUNTER — Other Ambulatory Visit: Payer: Self-pay | Admitting: Family Medicine

## 2018-01-30 DIAGNOSIS — R4586 Emotional lability: Secondary | ICD-10-CM | POA: Insufficient documentation

## 2018-01-30 DIAGNOSIS — E1149 Type 2 diabetes mellitus with other diabetic neurological complication: Secondary | ICD-10-CM

## 2018-01-30 DIAGNOSIS — E538 Deficiency of other specified B group vitamins: Secondary | ICD-10-CM

## 2018-01-30 DIAGNOSIS — Z0000 Encounter for general adult medical examination without abnormal findings: Secondary | ICD-10-CM | POA: Insufficient documentation

## 2018-01-30 DIAGNOSIS — E785 Hyperlipidemia, unspecified: Secondary | ICD-10-CM | POA: Insufficient documentation

## 2018-01-30 MED ORDER — CYANOCOBALAMIN 1000 MCG/ML IJ SOLN: INTRAMUSCULAR | Status: DC

## 2018-01-30 NOTE — Assessment & Plan Note (Addendum)
Continue work on diet and exercise.  Discussed with patient.  See notes on labs. >25 minutes spent in face to face time with patient, >50% spent in counselling or coordination of care

## 2018-01-30 NOTE — Assessment & Plan Note (Signed)
Mood d/w pt.  Still seeing dr. Clovis Pu, now on 30mg  prozac with relief- less irritable on higher dose of med.  D/w pt.  No SI/HI.  Compliant.

## 2018-01-30 NOTE — Assessment & Plan Note (Signed)
RLS and OSA.  Bipap and Requip help.  Compliant.  Still with burning and stinging in the legs, worse at night.  Noted some in the day.   See notes on labs.  I am hopeful that continued B12 replacement will help with the neuropathy symptoms.

## 2018-01-30 NOTE — Assessment & Plan Note (Signed)
Declined hearing aids, d/w pt.  Flu shot 2019 [facility] UTD Shingles out of stock.  Tetanus may be cheaper at pharmacy, d/w pt.  Advance directive- wife designated if patient were incapacitated.  D/w patient MO:[id number] for colon cancer screening, including IFOB vs. colonoscopy.  Risks and benefits of both were discussed and patient voiced understanding.  Pt elects for: cologuard.   PSA per urology, d/w pt.

## 2018-01-30 NOTE — Assessment & Plan Note (Signed)
Advance directive- wife designated if patient were incapacitated.  

## 2018-01-30 NOTE — Assessment & Plan Note (Signed)
Not lightheaded.  No CP, BLE edema.  Some SOB as expected going up flights of steps but not a few steps, not with regular everyday activity.  He can call about cards f/u.  D/w pt.  Taking lasix daily.  Continue as is for now otherwise.

## 2018-01-30 NOTE — Assessment & Plan Note (Signed)
Continue statin.  See notes on labs.  Continue work on diet and exercise.

## 2018-01-30 NOTE — Assessment & Plan Note (Signed)
Per outside clinic.  I will defer.  No tunnel vision.  No new symptoms.

## 2018-01-30 NOTE — Assessment & Plan Note (Signed)
Continue replacement for now.  See notes on labs.

## 2018-02-03 ENCOUNTER — Ambulatory Visit (INDEPENDENT_AMBULATORY_CARE_PROVIDER_SITE_OTHER): Payer: Medicare Other | Admitting: *Deleted

## 2018-02-03 DIAGNOSIS — E538 Deficiency of other specified B group vitamins: Secondary | ICD-10-CM

## 2018-02-03 MED ORDER — CYANOCOBALAMIN 1000 MCG/ML IJ SOLN
1000.0000 ug | Freq: Once | INTRAMUSCULAR | Status: AC
  Administered 2018-02-03: 1000 ug via INTRAMUSCULAR

## 2018-02-10 ENCOUNTER — Ambulatory Visit (INDEPENDENT_AMBULATORY_CARE_PROVIDER_SITE_OTHER): Payer: Medicare Other | Admitting: *Deleted

## 2018-02-10 DIAGNOSIS — E538 Deficiency of other specified B group vitamins: Secondary | ICD-10-CM | POA: Diagnosis not present

## 2018-02-10 MED ORDER — CYANOCOBALAMIN 1000 MCG/ML IJ SOLN
1000.0000 ug | Freq: Once | INTRAMUSCULAR | Status: AC
  Administered 2018-02-10: 1000 ug via INTRAMUSCULAR

## 2018-02-11 ENCOUNTER — Other Ambulatory Visit: Payer: Self-pay | Admitting: *Deleted

## 2018-02-11 ENCOUNTER — Ambulatory Visit (INDEPENDENT_AMBULATORY_CARE_PROVIDER_SITE_OTHER): Payer: Medicare Other | Admitting: Rheumatology

## 2018-02-11 ENCOUNTER — Encounter: Payer: Self-pay | Admitting: Rheumatology

## 2018-02-11 VITALS — BP 139/77 | HR 50 | Resp 16 | Ht 69.5 in | Wt 286.0 lb

## 2018-02-11 DIAGNOSIS — I472 Ventricular tachycardia, unspecified: Secondary | ICD-10-CM

## 2018-02-11 DIAGNOSIS — Z8679 Personal history of other diseases of the circulatory system: Secondary | ICD-10-CM | POA: Diagnosis not present

## 2018-02-11 DIAGNOSIS — L409 Psoriasis, unspecified: Secondary | ICD-10-CM | POA: Diagnosis not present

## 2018-02-11 DIAGNOSIS — I6501 Occlusion and stenosis of right vertebral artery: Secondary | ICD-10-CM

## 2018-02-11 DIAGNOSIS — Z862 Personal history of diseases of the blood and blood-forming organs and certain disorders involving the immune mechanism: Secondary | ICD-10-CM

## 2018-02-11 DIAGNOSIS — M19072 Primary osteoarthritis, left ankle and foot: Secondary | ICD-10-CM

## 2018-02-11 DIAGNOSIS — Z79899 Other long term (current) drug therapy: Secondary | ICD-10-CM | POA: Diagnosis not present

## 2018-02-11 DIAGNOSIS — G2581 Restless legs syndrome: Secondary | ICD-10-CM | POA: Diagnosis not present

## 2018-02-11 DIAGNOSIS — M19071 Primary osteoarthritis, right ankle and foot: Secondary | ICD-10-CM | POA: Diagnosis not present

## 2018-02-11 DIAGNOSIS — Z87448 Personal history of other diseases of urinary system: Secondary | ICD-10-CM | POA: Diagnosis not present

## 2018-02-11 DIAGNOSIS — Z8639 Personal history of other endocrine, nutritional and metabolic disease: Secondary | ICD-10-CM | POA: Diagnosis not present

## 2018-02-11 DIAGNOSIS — L4050 Arthropathic psoriasis, unspecified: Secondary | ICD-10-CM

## 2018-02-11 DIAGNOSIS — M19041 Primary osteoarthritis, right hand: Secondary | ICD-10-CM

## 2018-02-11 DIAGNOSIS — R600 Localized edema: Secondary | ICD-10-CM

## 2018-02-11 DIAGNOSIS — M19042 Primary osteoarthritis, left hand: Secondary | ICD-10-CM

## 2018-02-11 DIAGNOSIS — Z96653 Presence of artificial knee joint, bilateral: Secondary | ICD-10-CM

## 2018-02-11 DIAGNOSIS — Z8669 Personal history of other diseases of the nervous system and sense organs: Secondary | ICD-10-CM

## 2018-02-11 MED ORDER — METFORMIN HCL ER 750 MG PO TB24: 750.0000 mg | ORAL_TABLET | Freq: Two times a day (BID) | ORAL | 3 refills | Status: DC

## 2018-02-11 NOTE — Telephone Encounter (Signed)
Patient brought in completed renewal application. Faxed in to [employer]. Awaiting response.  Fax# [phone]  10:21 AM Beatriz Chancellor, CPhT

## 2018-02-19 ENCOUNTER — Ambulatory Visit (INDEPENDENT_AMBULATORY_CARE_PROVIDER_SITE_OTHER): Payer: Medicare Other

## 2018-02-19 DIAGNOSIS — E538 Deficiency of other specified B group vitamins: Secondary | ICD-10-CM

## 2018-02-19 MED ORDER — CYANOCOBALAMIN 1000 MCG/ML IJ SOLN
1000.0000 ug | Freq: Once | INTRAMUSCULAR | Status: AC
  Administered 2018-02-19: 1000 ug via INTRAMUSCULAR

## 2018-02-19 NOTE — Progress Notes (Signed)
Patient in office today for B12 injection. Tolerated injection well to left deltoid.  

## 2018-02-25 ENCOUNTER — Ambulatory Visit (INDEPENDENT_AMBULATORY_CARE_PROVIDER_SITE_OTHER): Payer: Medicare Other

## 2018-02-25 DIAGNOSIS — E538 Deficiency of other specified B group vitamins: Secondary | ICD-10-CM | POA: Diagnosis not present

## 2018-02-25 MED ORDER — CYANOCOBALAMIN 1000 MCG/ML IJ SOLN
1000.0000 ug | Freq: Once | INTRAMUSCULAR | Status: AC
  Administered 2018-02-25: 1000 ug via INTRAMUSCULAR

## 2018-02-25 NOTE — Progress Notes (Addendum)
Pt given #4 of weekly B12 injection in Right Deltoid. Tolerated well. Should go to Bimonthly now per labs.

## 2018-03-03 ENCOUNTER — Encounter: Payer: Self-pay | Admitting: Internal Medicine

## 2018-03-04 ENCOUNTER — Ambulatory Visit: Payer: Medicare Other

## 2018-03-10 ENCOUNTER — Telehealth: Payer: Self-pay

## 2018-03-10 ENCOUNTER — Ambulatory Visit (INDEPENDENT_AMBULATORY_CARE_PROVIDER_SITE_OTHER): Payer: Medicare Other | Admitting: Internal Medicine

## 2018-03-10 VITALS — BP 148/82 | HR 71 | Ht 69.5 in | Wt 282.0 lb

## 2018-03-10 DIAGNOSIS — I498 Other specified cardiac arrhythmias: Secondary | ICD-10-CM

## 2018-03-10 DIAGNOSIS — I480 Paroxysmal atrial fibrillation: Secondary | ICD-10-CM

## 2018-03-10 DIAGNOSIS — I10 Essential (primary) hypertension: Secondary | ICD-10-CM

## 2018-03-10 DIAGNOSIS — I6501 Occlusion and stenosis of right vertebral artery: Secondary | ICD-10-CM | POA: Diagnosis not present

## 2018-03-10 DIAGNOSIS — I5032 Chronic diastolic (congestive) heart failure: Secondary | ICD-10-CM | POA: Diagnosis not present

## 2018-03-10 MED ORDER — FUROSEMIDE 40 MG PO TABS: 40.0000 mg | ORAL_TABLET | Freq: Every morning | ORAL | 3 refills | Status: DC

## 2018-03-10 MED ORDER — ACEBUTOLOL HCL 200 MG PO CAPS: 200.0000 mg | ORAL_CAPSULE | Freq: Two times a day (BID) | ORAL | 3 refills | Status: DC

## 2018-03-10 NOTE — Telephone Encounter (Signed)
The pt left the provider part of a [employer] pt asst application for Eliquis here at the office. I completed the provider part and Dr Lovena Le has signed it.  I called the pt to let him know that we have completed his application and that it is ready to be picked up or I can mail it to him. Per his request I faxed it to [employer] as he states that he has already sent them his part of the application.
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
## 2018-03-10 NOTE — Patient Instructions (Signed)

## 2018-03-11 ENCOUNTER — Ambulatory Visit (INDEPENDENT_AMBULATORY_CARE_PROVIDER_SITE_OTHER): Payer: Medicare Other | Admitting: *Deleted

## 2018-03-11 DIAGNOSIS — E538 Deficiency of other specified B group vitamins: Secondary | ICD-10-CM | POA: Diagnosis not present

## 2018-03-11 MED ORDER — CYANOCOBALAMIN 1000 MCG/ML IJ SOLN
1000.0000 ug | Freq: Once | INTRAMUSCULAR | Status: AC
Start: 2018-03-11 — End: 2018-03-11
  Administered 2018-03-11: 1000 ug via INTRAMUSCULAR

## 2018-03-11 NOTE — Progress Notes (Signed)
Per orders of Dr. Duncan, injection of b12 given by Shawnise Peterkin H. Patient tolerated injection well.  

## 2018-03-21 ENCOUNTER — Other Ambulatory Visit: Payer: Self-pay

## 2018-03-21 MED ORDER — ROPINIROLE HCL 0.5 MG PO TABS: 1.0000 mg | ORAL_TABLET | Freq: Every day | ORAL | 0 refills | Status: DC

## 2018-03-24 ENCOUNTER — Ambulatory Visit (INDEPENDENT_AMBULATORY_CARE_PROVIDER_SITE_OTHER): Payer: Medicare Other

## 2018-03-24 DIAGNOSIS — E538 Deficiency of other specified B group vitamins: Secondary | ICD-10-CM | POA: Diagnosis not present

## 2018-03-24 MED ORDER — CYANOCOBALAMIN 1000 MCG/ML IJ SOLN
1000.0000 ug | Freq: Once | INTRAMUSCULAR | Status: AC
  Administered 2018-03-24: 1000 ug via INTRAMUSCULAR

## 2018-03-24 NOTE — Progress Notes (Signed)
Per orders of Dr. Duncan, injection of B12 given by Charlye Spare V Xiong Haidar. Patient tolerated injection well.  

## 2018-03-31 ENCOUNTER — Other Ambulatory Visit: Payer: Self-pay | Admitting: *Deleted

## 2018-03-31 MED ORDER — APREMILAST 30 MG PO TABS: 1.0000 | ORAL_TABLET | Freq: Two times a day (BID) | ORAL | 0 refills | Status: DC

## 2018-03-31 NOTE — Telephone Encounter (Signed)
Last Visit: 02/11/18 Next Visit: 08/12/18  Okay to refill per Dr. Estanislado Pandy

## 2018-04-08 ENCOUNTER — Ambulatory Visit: Payer: Medicare Other

## 2018-04-16 ENCOUNTER — Encounter: Payer: Self-pay | Admitting: Emergency Medicine

## 2018-04-17 ENCOUNTER — Encounter: Payer: Self-pay | Admitting: Emergency Medicine

## 2018-04-17 ENCOUNTER — Ambulatory Visit (INDEPENDENT_AMBULATORY_CARE_PROVIDER_SITE_OTHER): Payer: Medicare Other | Admitting: *Deleted

## 2018-04-17 DIAGNOSIS — E538 Deficiency of other specified B group vitamins: Secondary | ICD-10-CM

## 2018-04-17 DIAGNOSIS — F4310 Post-traumatic stress disorder, unspecified: Secondary | ICD-10-CM | POA: Insufficient documentation

## 2018-04-17 MED ORDER — CYANOCOBALAMIN 1000 MCG/ML IJ SOLN
1000.0000 ug | Freq: Once | INTRAMUSCULAR | Status: AC
  Administered 2018-04-17: 1000 ug via INTRAMUSCULAR

## 2018-04-17 NOTE — Progress Notes (Signed)
Per orders of Dr. Duncan, injection of b12 given by Drianna Chandran H. Patient tolerated injection well.  

## 2018-04-23 ENCOUNTER — Ambulatory Visit: Payer: Medicare Other

## 2018-04-24 DIAGNOSIS — Z1212 Encounter for screening for malignant neoplasm of rectum: Secondary | ICD-10-CM | POA: Diagnosis not present

## 2018-04-24 DIAGNOSIS — Z1211 Encounter for screening for malignant neoplasm of colon: Secondary | ICD-10-CM | POA: Diagnosis not present

## 2018-04-28 ENCOUNTER — Other Ambulatory Visit: Payer: Self-pay | Admitting: Internal Medicine

## 2018-04-28 DIAGNOSIS — I498 Other specified cardiac arrhythmias: Secondary | ICD-10-CM

## 2018-04-29 LAB — COLOGUARD: Cologuard: NEGATIVE

## 2018-05-01 ENCOUNTER — Ambulatory Visit (INDEPENDENT_AMBULATORY_CARE_PROVIDER_SITE_OTHER): Payer: Medicare Other

## 2018-05-01 DIAGNOSIS — E538 Deficiency of other specified B group vitamins: Secondary | ICD-10-CM

## 2018-05-01 MED ORDER — CYANOCOBALAMIN 1000 MCG/ML IJ SOLN
1000.0000 ug | Freq: Once | INTRAMUSCULAR | Status: AC
  Administered 2018-05-01: 1000 ug via INTRAMUSCULAR

## 2018-05-01 NOTE — Progress Notes (Signed)
Pt given bi-monthly 1000mg /ml B12 in Right Deltoid. Tolerated well.

## 2018-05-07 ENCOUNTER — Encounter: Payer: Self-pay | Admitting: Family Medicine

## 2018-05-08 ENCOUNTER — Other Ambulatory Visit: Payer: Self-pay | Admitting: Family Medicine

## 2018-05-08 MED ORDER — GABAPENTIN 100 MG PO CAPS: 100.0000 mg | ORAL_CAPSULE | Freq: Every day | ORAL | 1 refills | Status: DC

## 2018-05-15 ENCOUNTER — Ambulatory Visit (INDEPENDENT_AMBULATORY_CARE_PROVIDER_SITE_OTHER): Payer: Medicare Other | Admitting: *Deleted

## 2018-05-15 ENCOUNTER — Encounter: Payer: Self-pay | Admitting: Family Medicine

## 2018-05-15 DIAGNOSIS — E538 Deficiency of other specified B group vitamins: Secondary | ICD-10-CM | POA: Diagnosis not present

## 2018-05-15 MED ORDER — CYANOCOBALAMIN 1000 MCG/ML IJ SOLN
1000.0000 ug | Freq: Once | INTRAMUSCULAR | Status: AC
  Administered 2018-05-15: 1000 ug via INTRAMUSCULAR

## 2018-05-15 NOTE — Telephone Encounter (Signed)
Called to follow up on patient's renewal application. Rep stated that they spoke to patient about the date he put on his tax forms. Patient advised that he will correct and resubmit.  Left message for patient to see if he has re-submitted his tax forms.

## 2018-05-15 NOTE — Progress Notes (Signed)
Per orders of Dr. [name], injection of B12 1060mcg/mL given by [facility]. Patient tolerated injection well.

## 2018-05-20 ENCOUNTER — Other Ambulatory Visit: Payer: Self-pay | Admitting: Internal Medicine

## 2018-05-20 ENCOUNTER — Other Ambulatory Visit: Payer: Self-pay | Admitting: *Deleted

## 2018-05-20 MED ORDER — GLIPIZIDE 5 MG PO TABS: 5.0000 mg | ORAL_TABLET | Freq: Every day | ORAL | 0 refills | Status: DC

## 2018-05-21 ENCOUNTER — Encounter: Payer: Self-pay | Admitting: Family Medicine

## 2018-05-29 ENCOUNTER — Ambulatory Visit (INDEPENDENT_AMBULATORY_CARE_PROVIDER_SITE_OTHER): Payer: Medicare Other | Admitting: Family Medicine

## 2018-05-29 ENCOUNTER — Other Ambulatory Visit: Payer: Self-pay

## 2018-05-29 ENCOUNTER — Other Ambulatory Visit (INDEPENDENT_AMBULATORY_CARE_PROVIDER_SITE_OTHER): Payer: Medicare Other

## 2018-05-29 ENCOUNTER — Encounter: Payer: Self-pay | Admitting: Family Medicine

## 2018-05-29 ENCOUNTER — Ambulatory Visit: Payer: Medicare Other

## 2018-05-29 VITALS — BP 122/70 | HR 59 | Temp 97.7°F | Ht 69.0 in | Wt 276.6 lb

## 2018-05-29 DIAGNOSIS — E1149 Type 2 diabetes mellitus with other diabetic neurological complication: Secondary | ICD-10-CM | POA: Diagnosis not present

## 2018-05-29 DIAGNOSIS — E538 Deficiency of other specified B group vitamins: Secondary | ICD-10-CM

## 2018-05-29 LAB — VITAMIN B12: VITAMIN B 12: 450 pg/mL (ref 211–911)

## 2018-05-29 LAB — HEMOGLOBIN A1C: Hgb A1c MFr Bld: 6.1 % (ref 4.6–6.5)

## 2018-05-29 MED ORDER — GLIPIZIDE 5 MG PO TABS: 5.0000 mg | ORAL_TABLET | Freq: Every day | ORAL | 3 refills | Status: DC

## 2018-05-29 MED ORDER — GABAPENTIN 300 MG PO CAPS: ORAL_CAPSULE | ORAL | 3 refills | Status: DC

## 2018-05-29 MED ORDER — GABAPENTIN 100 MG PO CAPS: ORAL_CAPSULE | ORAL | Status: DC

## 2018-05-29 MED ORDER — CYANOCOBALAMIN 1000 MCG/ML IJ SOLN
1000.0000 ug | Freq: Once | INTRAMUSCULAR | Status: AC
  Administered 2018-05-29: 1000 ug via INTRAMUSCULAR

## 2018-05-29 NOTE — Patient Instructions (Signed)
We'll contact you with your lab report. B12 shot today.  Continue as is with every 14 day shots for now.  Try the higher strength of gabapentin.  It would be reasonable to recheck A1c in about 3 months at a visit.  Take care.  Glad to see you.

## 2018-05-29 NOTE — Progress Notes (Signed)
Diabetes:  Using medications without difficulties: yes Hypoglycemic episodes: no Hyperglycemic episodes: no Feet problems: see below Blood Sugars averaging: 140s recently, after running out of glipizide, was lower (120s) prev on med eye exam within last year: f/u pending.   A1c and B12 pending.    He is still having nighttime foot and leg pain.  Gabapentin helps some.  No ADE on med.  He thought he was some better with combination of b12 and gabapentin.    Mood is better on higher dose of SSRI with f/u pending.   See after visit summary.  Meds, vitals, and allergies reviewed.   ROS: Per HPI unless specifically indicated in ROS section   GEN: nad, alert and oriented HEENT: mucous membranes moist NECK: supple w/o LA CV: rrr. PULM: ctab, no inc wob ABD: soft, +bs EXT: no edema SKIN: well perfused.

## 2018-05-30 ENCOUNTER — Ambulatory Visit (INDEPENDENT_AMBULATORY_CARE_PROVIDER_SITE_OTHER): Payer: Medicare Other | Admitting: Psychiatry

## 2018-05-30 ENCOUNTER — Encounter: Payer: Self-pay | Admitting: Psychiatry

## 2018-05-30 DIAGNOSIS — F4310 Post-traumatic stress disorder, unspecified: Secondary | ICD-10-CM | POA: Diagnosis not present

## 2018-05-30 DIAGNOSIS — F330 Major depressive disorder, recurrent, mild: Secondary | ICD-10-CM

## 2018-05-30 MED ORDER — FLUOXETINE HCL 10 MG PO TABS: 30.0000 mg | ORAL_TABLET | Freq: Every morning | ORAL | 2 refills | Status: DC
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
## 2018-06-01 NOTE — Assessment & Plan Note (Signed)
No change in meds at this point.  See notes on labs. Change to 300 mg tablet of gabapentin.  See orders.  He can take an extra 300 mg/day if needed. It would be reasonable to recheck A1c in about 3 months at a visit.

## 2018-06-01 NOTE — Assessment & Plan Note (Signed)
See notes on labs. 

## 2018-06-02 NOTE — Telephone Encounter (Signed)
Received fax from [employer], patient's renewal application has been APPROVED. Coverage dates are from 05/30/2018 to 04/02/2019.   Will send documents to [facility].   Phone# [phone] Fax# [phone]

## 2018-06-03 NOTE — Telephone Encounter (Signed)
**Note De-Identified  Obfuscation** Letter received from [employer] stating that they have approved the pt for pt asst with Eliquis. Approval good from 06/02/2018 until 04/02/2019. Application Case: [id number]  I have notified the pts pharmacy.

## 2018-06-12 ENCOUNTER — Ambulatory Visit: Payer: Medicare Other

## 2018-06-15 DIAGNOSIS — G4733 Obstructive sleep apnea (adult) (pediatric): Secondary | ICD-10-CM

## 2018-06-16 NOTE — Telephone Encounter (Signed)
[facility] has been sold to [facility] and they do not have a local supplier. The offficesclose to me are located in [place]. Can you provide the name of a CPAP supplier in the [place] that i can contact for supplies for my BiPAP? I a overdue supplies, [facility] dropped me from their automatic 6 month supply list. [name]   PCC's can you help with this my chart message

## 2018-06-16 NOTE — Telephone Encounter (Signed)
[facility] to provide new DME

## 2018-06-16 NOTE — Telephone Encounter (Signed)
Patient request a new cpap supplier closer to him.  Patient currently with [facility]. [place] said he could switch DME's, but would need a order for cpap supplies. Patient last OV was 07/2017.   Dr Elsworth Soho, please advise, if your ok with new cpap supply order

## 2018-06-17 ENCOUNTER — Ambulatory Visit (INDEPENDENT_AMBULATORY_CARE_PROVIDER_SITE_OTHER): Payer: Medicare Other

## 2018-06-17 ENCOUNTER — Other Ambulatory Visit: Payer: Self-pay

## 2018-06-17 DIAGNOSIS — E538 Deficiency of other specified B group vitamins: Secondary | ICD-10-CM

## 2018-06-17 MED ORDER — CYANOCOBALAMIN 1000 MCG/ML IJ SOLN
1000.0000 ug | Freq: Once | INTRAMUSCULAR | Status: AC
  Administered 2018-06-17: 1000 ug via INTRAMUSCULAR

## 2018-06-17 NOTE — Progress Notes (Signed)
Pt received his every 14 days B12 injection in his left deltoid. Tolerated well.

## 2018-06-17 NOTE — Telephone Encounter (Signed)
Order will need to be placed for cpap supplies to send to new dme if pt wishes to switch.

## 2018-06-19 ENCOUNTER — Other Ambulatory Visit: Payer: Self-pay | Admitting: Family Medicine

## 2018-06-19 DIAGNOSIS — I10 Essential (primary) hypertension: Secondary | ICD-10-CM

## 2018-06-19 NOTE — Telephone Encounter (Signed)
Electronic refill request. Ropinirole  Previously prescribed by Dr. Elsworth Soho Last office visit:   05/29/2018 Last Filled:    180 tablet 0 03/21/2018  Please advise.

## 2018-06-20 NOTE — Telephone Encounter (Signed)
Sent. Thanks.   

## 2018-06-23 ENCOUNTER — Telehealth: Payer: Self-pay | Admitting: Pulmonary Disease

## 2018-06-23 NOTE — Telephone Encounter (Signed)
LMTCB. We need to set patient up with a Tele Visit due to wanting to switch DME companies, patient will need office visit but Tele Visit will suffice.

## 2018-06-24 NOTE — Telephone Encounter (Signed)
Returning call from [place]        CB# [phone]

## 2018-06-24 NOTE — Telephone Encounter (Signed)
Attempted to call pt but unable to reach. Left message for pt to return call. 

## 2018-06-24 NOTE — Telephone Encounter (Signed)
Left message for patient to call back  

## 2018-06-25 NOTE — Telephone Encounter (Signed)
LMTCB- please schedule televisit when he calls back thanks

## 2018-06-26 ENCOUNTER — Ambulatory Visit (INDEPENDENT_AMBULATORY_CARE_PROVIDER_SITE_OTHER): Payer: Medicare Other | Admitting: Pulmonary Disease

## 2018-06-26 ENCOUNTER — Ambulatory Visit (INDEPENDENT_AMBULATORY_CARE_PROVIDER_SITE_OTHER): Payer: Medicare Other | Admitting: *Deleted

## 2018-06-26 ENCOUNTER — Other Ambulatory Visit: Payer: Self-pay | Admitting: Family Medicine

## 2018-06-26 ENCOUNTER — Encounter: Payer: Self-pay | Admitting: Pulmonary Disease

## 2018-06-26 ENCOUNTER — Other Ambulatory Visit: Payer: Self-pay

## 2018-06-26 DIAGNOSIS — E538 Deficiency of other specified B group vitamins: Secondary | ICD-10-CM | POA: Diagnosis not present

## 2018-06-26 DIAGNOSIS — G4733 Obstructive sleep apnea (adult) (pediatric): Secondary | ICD-10-CM

## 2018-06-26 MED ORDER — CYANOCOBALAMIN 1000 MCG/ML IJ SOLN: 1000.0000 ug | INTRAMUSCULAR | Status: DC

## 2018-06-26 MED ORDER — CYANOCOBALAMIN 1000 MCG/ML IJ SOLN
1000.0000 ug | Freq: Once | INTRAMUSCULAR | Status: AC
  Administered 2018-06-26: 1000 ug via INTRAMUSCULAR

## 2018-06-26 NOTE — Addendum Note (Signed)
Addended by: Joella Prince on: 06/26/2018 10:40 AM   Modules accepted: Orders

## 2018-06-26 NOTE — Assessment & Plan Note (Signed)
Plan: Continue to advocate for the patient to lose weight This will help with management of his severe obstructive sleep apnea as well as other comorbidities

## 2018-06-26 NOTE — Patient Instructions (Addendum)
[facility] >>>Contact number for them is Dimas Chyle [phone] or [phone] ext [place] >>> [phone] >> [email or web address]    We recommend that you continue using your CPAP daily >>>Keep up the hard work using your device >>> Goal should be wearing this for the entire night that you are sleeping, at least 4 to 6 hours  Remember:  . Do not drive or operate heavy machinery if tired or drowsy.  . Please notify the supply company and office if you are unable to use your device regularly due to missing supplies or machine being broken.  . Work on maintaining a healthy weight and following your recommended nutrition plan  . Maintain proper daily exercise and movement  . Maintaining proper use of your device can also help improve management of other chronic illnesses such as: Blood pressure, blood sugars, and weight management.   BiPAP/ CPAP Cleaning:  >>>Clean weekly, with Dawn soap, and bottle brush.  Set up to air dry.    Coronavirus (COVID-19) Are you at risk?  Are you at risk for the Coronavirus (COVID-19)?  To be considered HIGH RISK for Coronavirus (COVID-19), you have to meet the following criteria:  . Traveled to [place], [place], [place], [place] or [place]; or in the [place] to [place], [place], [place], or [place]; and have fever, cough, and shortness of breath within the last 2 weeks of travel OR . Been in close contact with a person diagnosed with COVID-19 within the last 2 weeks and have fever, cough, and shortness of breath . IF YOU DO NOT MEET THESE CRITERIA, YOU ARE CONSIDERED LOW RISK FOR COVID-19.  What to do if you are HIGH RISK for COVID-19?  Marland Kitchen If you are having a medical emergency, call 911. . Seek medical care right away. Before you go to a doctor's office, urgent care or emergency department, call ahead and tell them about your recent travel, contact with someone diagnosed with  COVID-19, and your symptoms. You should receive instructions from your physician's office regarding next steps of care.  . When you arrive at healthcare provider, tell the healthcare staff immediately you have returned from visiting [place], [place], [place], [place] or [place]; or traveled in the [place] to [place], [place], [place], or [place]; in the last two weeks or you have been in close contact with a person diagnosed with COVID-19 in the last 2 weeks.   . Tell the health care staff about your symptoms: fever, cough and shortness of breath. . After you have been seen by a medical provider, you will be either: o Tested for (COVID-19) and discharged home on quarantine except to seek medical care if symptoms worsen, and asked to  - Stay home and avoid contact with others until you get your results (4-5 days)  - Avoid travel on public transportation if possible (such as bus, train, or airplane) or o Sent to the Emergency Department by EMS for evaluation, COVID-19 testing, and possible admission depending on your condition and test results.  What to do if you are LOW RISK for COVID-19?  Reduce your risk of any infection by using the same precautions used for avoiding the common cold or flu:  Marland Kitchen Wash your hands often with soap and warm water for at least 20 seconds.  If soap and water are not readily available, use an alcohol-based hand sanitizer with at least  60% alcohol.  . If coughing or sneezing, cover your mouth and nose by coughing or sneezing into the elbow areas of your shirt or coat, into a tissue or into your sleeve (not your hands). . Avoid shaking hands with others and consider head nods or verbal greetings only. . Avoid touching your eyes, nose, or mouth with unwashed hands.  . Avoid close contact with people who are sick. . Avoid places or events with large numbers of people in one location, like concerts or sporting events. . Carefully consider travel plans you have or  are making. . If you are planning any travel outside or inside the [place], visit the CDC's Travelers' Health webpage for the latest health notices. . If you have some symptoms but not all symptoms, continue to monitor at home and seek medical attention if your symptoms worsen. . If you are having a medical emergency, call 911.   [place] / e-Visit: [email or web address]         [facility]: [facility]: [phone]                   [facility]: [phone]           It is flu season:   >>> Best ways to protect herself from the flu: Receive the yearly flu vaccine, practice good hand hygiene washing with soap and also using hand sanitizer when available, eat a nutritious meals, get adequate rest, hydrate appropriately   Please contact the office if your symptoms worsen or you have concerns that you are not improving.   Thank you for choosing [facility] for your healthcare, and for allowing [place] to partner with you on your healthcare journey. I am thankful to be able to provide care to you today.   [name] FNP-C       CPAP and BPAP Information CPAP and BPAP are methods of helping a person breathe with the use of air pressure. CPAP stands for "continuous positive airway pressure." BPAP stands for "bi-level positive airway pressure." In both methods, air is blown through your nose or mouth and into your air passages to help you breathe well. CPAP and BPAP use different amounts of pressure to blow air. With CPAP, the amount of pressure stays the same while you breathe in and out. With BPAP, the amount of pressure is increased when you breathe in (inhale) so that you can take larger breaths. Your health care provider will recommend whether CPAP or BPAP would be more helpful for you. Why are CPAP and BPAP treatments  used? CPAP or BPAP can be helpful if you have:  Sleep apnea.  Chronic obstructive pulmonary disease (COPD).  Heart failure.  Medical conditions that weaken the muscles of the chest including muscular dystrophy, or neurological diseases such as amyotrophic lateral sclerosis (ALS).  Other problems that cause breathing to be weak, abnormal, or difficult. CPAP is most commonly used for obstructive sleep apnea (OSA) to keep the airways from collapsing when the muscles relax during sleep. How is CPAP or BPAP administered? Both CPAP and BPAP are provided by a small machine with a flexible plastic tube that attaches to a plastic mask. You wear the mask. Air is blown through the mask into your nose or mouth. The amount of pressure that is used to blow the air can be adjusted on the machine. Your health care provider will determine the pressure  setting that should be used based on your individual needs. When should CPAP or BPAP be used? In most cases, the mask only needs to be worn during sleep. Generally, the mask needs to be worn throughout the night and during any daytime naps. People with certain medical conditions may also need to wear the mask at other times when they are awake. Follow instructions from your health care provider about when to use the machine. What are some tips for using the mask?   Because the mask needs to be snug, some people feel trapped or closed-in (claustrophobic) when first using the mask. If you feel this way, you may need to get used to the mask. One way to do this is by holding the mask loosely over your nose or mouth and then gradually applying the mask more snugly. You can also gradually increase the amount of time that you use the mask.  Masks are available in various types and sizes. Some fit over your mouth and nose while others fit over just your nose. If your mask does not fit well, talk with your health care provider about getting a different one.  If you are  using a mask that fits over your nose and you tend to breathe through your mouth, a chin strap may be applied to help keep your mouth closed.  The CPAP and BPAP machines have alarms that may sound if the mask comes off or develops a leak.  If you have trouble with the mask, it is very important that you talk with your health care provider about finding a way to make the mask easier to tolerate. Do not stop using the mask. Stopping the use of the mask could have a negative impact on your health. What are some tips for using the machine?  Place your CPAP or BPAP machine on a secure table or stand near an electrical outlet.  Know where the on/off switch is located on the machine.  Follow instructions from your health care provider about how to set the pressure on your machine and when you should use it.  Do not eat or drink while the CPAP or BPAP machine is on. Food or fluids could get pushed into your lungs by the pressure of the CPAP or BPAP.  Do not smoke. Tobacco smoke residue can damage the machine.  For home use, CPAP and BPAP machines can be rented or purchased through home health care companies. Many different brands of machines are available. Renting a machine before purchasing may help you find out which particular machine works well for you.  Keep the CPAP or BPAP machine and attachments clean. Ask your health care provider for specific instructions. Get help right away if:  You have redness or open areas around your nose or mouth where the mask fits.  You have trouble using the CPAP or BPAP machine.  You cannot tolerate wearing the CPAP or BPAP mask.  You have pain, discomfort, and bloating in your abdomen. Summary  CPAP and BPAP are methods of helping a person breathe with the use of air pressure.  Both CPAP and BPAP are provided by a small machine with a flexible plastic tube that attaches to a plastic mask.  If you have trouble with the mask, it is very important that  you talk with your health care provider about finding a way to make the mask easier to tolerate. This information is not intended to replace advice given to you by your health  care provider. Make sure you discuss any questions you have with your health care provider. Document Released: 12/16/2003 Document Revised: 11/19/2017 Document Reviewed: 02/06/2016 Elsevier Interactive Patient Education  2019 Else[employer] Inc.      CPAP and BPAP Information CPAP and BPAP are methods of helping a person breathe with the use of air pressure. CPAP stands for "continuous positive airway pressure." BPAP stands for "bi-level positive airway pressure." In both methods, air is blown through your nose or mouth and into your air passages to help you breathe well. CPAP and BPAP use different amounts of pressure to blow air. With CPAP, the amount of pressure stays the same while you breathe in and out. With BPAP, the amount of pressure is increased when you breathe in (inhale) so that you can take larger breaths. Your health care provider will recommend whether CPAP or BPAP would be more helpful for you. Why are CPAP and BPAP treatments used? CPAP or BPAP can be helpful if you have:  Sleep apnea.  Chronic obstructive pulmonary disease (COPD).  Heart failure.  Medical conditions that weaken the muscles of the chest including muscular dystrophy, or neurological diseases such as amyotrophic lateral sclerosis (ALS).  Other problems that cause breathing to be weak, abnormal, or difficult. CPAP is most commonly used for obstructive sleep apnea (OSA) to keep the airways from collapsing when the muscles relax during sleep. How is CPAP or BPAP administered? Both CPAP and BPAP are provided by a small machine with a flexible plastic tube that attaches to a plastic mask. You wear the mask. Air is blown through the mask into your nose or mouth. The amount of pressure that is used to blow the air can be adjusted on the machine.  Your health care provider will determine the pressure setting that should be used based on your individual needs. When should CPAP or BPAP be used? In most cases, the mask only needs to be worn during sleep. Generally, the mask needs to be worn throughout the night and during any daytime naps. People with certain medical conditions may also need to wear the mask at other times when they are awake. Follow instructions from your health care provider about when to use the machine. What are some tips for using the mask?   Because the mask needs to be snug, some people feel trapped or closed-in (claustrophobic) when first using the mask. If you feel this way, you may need to get used to the mask. One way to do this is by holding the mask loosely over your nose or mouth and then gradually applying the mask more snugly. You can also gradually increase the amount of time that you use the mask.  Masks are available in various types and sizes. Some fit over your mouth and nose while others fit over just your nose. If your mask does not fit well, talk with your health care provider about getting a different one.  If you are using a mask that fits over your nose and you tend to breathe through your mouth, a chin strap may be applied to help keep your mouth closed.  The CPAP and BPAP machines have alarms that may sound if the mask comes off or develops a leak.  If you have trouble with the mask, it is very important that you talk with your health care provider about finding a way to make the mask easier to tolerate. Do not stop using the mask. Stopping the use of the mask could  have a negative impact on your health. What are some tips for using the machine?  Place your CPAP or BPAP machine on a secure table or stand near an electrical outlet.  Know where the on/off switch is located on the machine.  Follow instructions from your health care provider about how to set the pressure on your machine and when you  should use it.  Do not eat or drink while the CPAP or BPAP machine is on. Food or fluids could get pushed into your lungs by the pressure of the CPAP or BPAP.  Do not smoke. Tobacco smoke residue can damage the machine.  For home use, CPAP and BPAP machines can be rented or purchased through home health care companies. Many different brands of machines are available. Renting a machine before purchasing may help you find out which particular machine works well for you.  Keep the CPAP or BPAP machine and attachments clean. Ask your health care provider for specific instructions. Get help right away if:  You have redness or open areas around your nose or mouth where the mask fits.  You have trouble using the CPAP or BPAP machine.  You cannot tolerate wearing the CPAP or BPAP mask.  You have pain, discomfort, and bloating in your abdomen. Summary  CPAP and BPAP are methods of helping a person breathe with the use of air pressure.  Both CPAP and BPAP are provided by a small machine with a flexible plastic tube that attaches to a plastic mask.  If you have trouble with the mask, it is very important that you talk with your health care provider about finding a way to make the mask easier to tolerate. This information is not intended to replace advice given to you by your health care provider. Make sure you discuss any questions you have with your health care provider. Document Released: 12/16/2003 Document Revised: 11/19/2017 Document Reviewed: 02/06/2016 Elsevier Interactive Patient Education  2019 [employer].

## 2018-06-26 NOTE — Progress Notes (Signed)
Per orders of Dr. Duncan, injection of B12 given by Graeson Nouri M. Patient tolerated injection well. 

## 2018-06-26 NOTE — Telephone Encounter (Signed)
Spoke with pt. He has been scheduled for a telehealth visit today with Aaron Edelman at [facility]. Nothing further was needed at this time.

## 2018-06-26 NOTE — Assessment & Plan Note (Signed)
Assessment: Patient reports he uses BiPAP every night Patient reports that he can feel a difference when he does not use his BiPAP 2013 sleep study with an AHI of 91 Last weight in chart 276 pounds Patient would like to switch DME companies from [facility] to [facility]  Plan: Okay to switch DME companies I have contacted [facility]) and they will pull the patient's information and start to evaluate the switch. Coordinate starting telephonic outreach in 8 to 12 weeks to evaluate the patient has received BiPAP and things are going well

## 2018-06-26 NOTE — Progress Notes (Addendum)
Virtual Visit via Telephone Note  I connected with [name] on 06/26/18 at 10:00 AM EDT by telephone and verified that I am speaking with the correct person using two identifiers.   I discussed the limitations, risks, security and privacy concerns of performing an evaluation and management service by telephone and the availability of in person appointments. I also discussed with the patient that there may be a patient responsible charge related to this service. The patient expressed understanding and agreed to proceed.   History of Present Illness:  Time when patient was reached: 0839 Time when visit ended: 0856 Patient consented to consult via telephone: yes People present and their role in pt care: Pt   Chief complaint: OSA managed on Bipap  73 year old male followed in our office for severe obstructive sleep apnea managed on BiPAP.  Patient reports that he would like to switch his DME company from [facility] to [facility].  Patient reports he said no issues with using his BiPAP.  He reports he uses it every night.  He does earlier than usual as he has neuropathy and he puts his BiPAP on prior to falling asleep.  Patient has not been seen since May/2019 in office.  We are completing a telephonic visit today so that we can coordinate his switching of DME companies.  DME switching to [facility] >>> [facility]    Observations/Objective:  NPSG 02/2012: AHI 91/hr.  Auto 2014: Optimal pressure 15cm Poor CPAP tolerance  Bilevel titration study 05/2013: 11/7cm, +++limb movements with 7/hr with arousal or awakening.   PFT  08/07/16: FVC 3.27 L (75%) FEV1 2.82 L (88%) FEV1/FVC 0.86  negative bronchodilator response TLC 5.36 L (77%) DLCO uncorrected 53%  10/02/11: FVC 3.53 L (76%) FEV1 2.96 L (86%) FEV1/FVC 0.84 FEF 25-75 3.38 L (124%) negative bronchodilator response TLC 6.73 L (95%) RV 120%                DLCO uncorrected 64%  Assessment and  Plan:  Obstructive sleep apnea Assessment: Patient reports he uses BiPAP every night Patient reports that he can feel a difference when he does not use his BiPAP 2013 sleep study with an AHI of 91 Last weight in chart 276 pounds Patient would like to switch DME companies [facility]  Plan: Okay to switch DME companies I have contacted [facility]) and they will pull the patient's information and start to evaluate the switch. Coordinate starting telephonic outreach in 8 to 12 weeks to evaluate the patient has received BiPAP and things are going well    Morbid (severe) obesity due to excess calories ([place]) Plan: Continue to advocate for the patient to lose weight This will help with management of his severe obstructive sleep apnea as well as other comorbidities  Follow Up Instructions:  Follow up 8 - 12 weeks telephonically with [name] FNP    I discussed the assessment and treatment plan with the patient. The patient was provided an opportunity to ask questions and all were answered. The patient agreed with the plan and demonstrated an understanding of the instructions.   The patient was advised to call back or seek an in-person evaluation if the symptoms worsen or if the condition fails to improve as anticipated.  I provided 17 minutes of non-face-to-face time during this encounter.   Lauraine Rinne, NP   Addendum-06/26/2018 I have contacted [facility] and they said  that they will be able to care for the patient.  05/26/2018-06/24/2018- BiPAP compliance report-showing 30 out of 30 days use, all 30 those days greater than 4 hours, average usage 12 hours and 3 minutes, IPAP 11, EPAP 7, AHI 1.4

## 2018-07-10 ENCOUNTER — Other Ambulatory Visit: Payer: Self-pay

## 2018-07-10 ENCOUNTER — Ambulatory Visit (INDEPENDENT_AMBULATORY_CARE_PROVIDER_SITE_OTHER): Payer: Medicare Other

## 2018-07-10 DIAGNOSIS — E538 Deficiency of other specified B group vitamins: Secondary | ICD-10-CM | POA: Diagnosis not present

## 2018-07-10 DIAGNOSIS — G4733 Obstructive sleep apnea (adult) (pediatric): Secondary | ICD-10-CM

## 2018-07-10 MED ORDER — CYANOCOBALAMIN 1000 MCG/ML IJ SOLN
1000.0000 ug | Freq: Once | INTRAMUSCULAR | Status: AC
  Administered 2018-07-10: 1000 ug via INTRAMUSCULAR

## 2018-07-10 NOTE — Progress Notes (Signed)
Per orders of Dr. [name], injection of vitamin Q01 given by Brenton Grills. Patient tolerated injection well.

## 2018-07-15 NOTE — Telephone Encounter (Signed)
OK for Rx for supplies & change DME

## 2018-07-15 NOTE — Telephone Encounter (Signed)
Pt requesting to change from [facility] to new DME as order for DME change to [facility] was placed at last visit with [name], NP 06/26/2018 to a different DME due to receiving the wrong supplies for his BIPAP and per them, he was going to have to either go to the location in [place] or the location in [place] to be able to change out and get the correct supplies.  Pt is wanting to change now to [place]. Dr. Elsworth Soho, please advise if you are fine with [place] placing order for pt to be switched to [place] so he can be able to receive correct supplies for his machine. Thanks!

## 2018-07-16 NOTE — Addendum Note (Signed)
Addended by: Lorretta Harp on: 07/16/2018 10:03 AM   Modules accepted: Orders

## 2018-07-16 NOTE — Telephone Encounter (Signed)
Order placed to [facility] for pt to have DME changed so he could be able to get supplies from them.

## 2018-07-24 ENCOUNTER — Other Ambulatory Visit: Payer: Self-pay

## 2018-07-24 ENCOUNTER — Ambulatory Visit (INDEPENDENT_AMBULATORY_CARE_PROVIDER_SITE_OTHER): Payer: Medicare Other

## 2018-07-24 DIAGNOSIS — E538 Deficiency of other specified B group vitamins: Secondary | ICD-10-CM

## 2018-07-24 MED ORDER — CYANOCOBALAMIN 1000 MCG/ML IJ SOLN
1000.0000 ug | Freq: Once | INTRAMUSCULAR | Status: AC
  Administered 2018-07-24: 1000 ug via INTRAMUSCULAR

## 2018-07-24 NOTE — Progress Notes (Signed)
Per orders of Dr. [name], injection of B12 given by Kris Mouton. Patient tolerated injection well.  Patient gets B12 every 2 weeks right now

## 2018-08-07 ENCOUNTER — Ambulatory Visit (INDEPENDENT_AMBULATORY_CARE_PROVIDER_SITE_OTHER): Payer: Medicare Other

## 2018-08-07 DIAGNOSIS — E538 Deficiency of other specified B group vitamins: Secondary | ICD-10-CM | POA: Diagnosis not present

## 2018-08-07 MED ORDER — CYANOCOBALAMIN 1000 MCG/ML IJ SOLN
1000.0000 ug | Freq: Once | INTRAMUSCULAR | Status: AC
  Administered 2018-08-07: 1000 ug via INTRAMUSCULAR

## 2018-08-07 NOTE — Progress Notes (Signed)
Per orders of Dr. [name], injection of B12 given by Kris Mouton. Patient tolerated injection well.  Gets B12 injections every 2 weeks right now
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
## 2018-08-12 ENCOUNTER — Ambulatory Visit: Payer: Self-pay

## 2018-08-12 ENCOUNTER — Other Ambulatory Visit: Payer: Self-pay

## 2018-08-12 ENCOUNTER — Telehealth: Payer: Self-pay | Admitting: *Deleted

## 2018-08-12 ENCOUNTER — Ambulatory Visit (INDEPENDENT_AMBULATORY_CARE_PROVIDER_SITE_OTHER): Payer: Medicare Other

## 2018-08-12 ENCOUNTER — Ambulatory Visit (INDEPENDENT_AMBULATORY_CARE_PROVIDER_SITE_OTHER): Payer: Medicare Other | Admitting: Rheumatology

## 2018-08-12 ENCOUNTER — Encounter: Payer: Self-pay | Admitting: Rheumatology

## 2018-08-12 VITALS — BP 138/60 | HR 81 | Resp 18 | Ht 69.5 in | Wt 289.0 lb

## 2018-08-12 DIAGNOSIS — Z87448 Personal history of other diseases of urinary system: Secondary | ICD-10-CM

## 2018-08-12 DIAGNOSIS — Z79899 Other long term (current) drug therapy: Secondary | ICD-10-CM | POA: Diagnosis not present

## 2018-08-12 DIAGNOSIS — M19041 Primary osteoarthritis, right hand: Secondary | ICD-10-CM | POA: Diagnosis not present

## 2018-08-12 DIAGNOSIS — M19071 Primary osteoarthritis, right ankle and foot: Secondary | ICD-10-CM

## 2018-08-12 DIAGNOSIS — L4050 Arthropathic psoriasis, unspecified: Secondary | ICD-10-CM

## 2018-08-12 DIAGNOSIS — Z8679 Personal history of other diseases of the circulatory system: Secondary | ICD-10-CM | POA: Diagnosis not present

## 2018-08-12 DIAGNOSIS — G2581 Restless legs syndrome: Secondary | ICD-10-CM | POA: Diagnosis not present

## 2018-08-12 DIAGNOSIS — Z96653 Presence of artificial knee joint, bilateral: Secondary | ICD-10-CM

## 2018-08-12 DIAGNOSIS — I472 Ventricular tachycardia, unspecified: Secondary | ICD-10-CM

## 2018-08-12 DIAGNOSIS — L409 Psoriasis, unspecified: Secondary | ICD-10-CM

## 2018-08-12 DIAGNOSIS — Z8639 Personal history of other endocrine, nutritional and metabolic disease: Secondary | ICD-10-CM | POA: Diagnosis not present

## 2018-08-12 DIAGNOSIS — M19042 Primary osteoarthritis, left hand: Secondary | ICD-10-CM

## 2018-08-12 DIAGNOSIS — Z8669 Personal history of other diseases of the nervous system and sense organs: Secondary | ICD-10-CM

## 2018-08-12 DIAGNOSIS — Z862 Personal history of diseases of the blood and blood-forming organs and certain disorders involving the immune mechanism: Secondary | ICD-10-CM | POA: Diagnosis not present

## 2018-08-12 DIAGNOSIS — M19072 Primary osteoarthritis, left ankle and foot: Secondary | ICD-10-CM

## 2018-08-12 NOTE — Telephone Encounter (Signed)
Patient advised that the x-rays are unchanged from previous xrays. Patient verbalized understanding.

## 2018-08-12 NOTE — Telephone Encounter (Signed)
Attempted to contact the patient and left message for patient to call the office. Patient's x-ray's are unchanged from previous x-rays.

## 2018-08-19 ENCOUNTER — Telehealth: Payer: Self-pay | Admitting: Pulmonary Disease

## 2018-08-19 ENCOUNTER — Encounter: Payer: Self-pay | Admitting: Pulmonary Disease

## 2018-08-19 ENCOUNTER — Other Ambulatory Visit: Payer: Self-pay

## 2018-08-19 ENCOUNTER — Ambulatory Visit (INDEPENDENT_AMBULATORY_CARE_PROVIDER_SITE_OTHER): Payer: Medicare Other | Admitting: Pulmonary Disease

## 2018-08-19 DIAGNOSIS — G4733 Obstructive sleep apnea (adult) (pediatric): Secondary | ICD-10-CM | POA: Diagnosis not present

## 2018-08-19 NOTE — Patient Instructions (Addendum)
New BiPAP order  Same settings as before DME: [facility] / Mask as needed    Compliance check in 2 months   We recommend that you continue using your CPAP daily >>>Keep up the hard work using your device >>> Goal should be wearing this for the entire night that you are sleeping, at least 4 to 6 hours  Remember:   Do not drive or operate heavy machinery if tired or drowsy.   Please notify the supply company and office if you are unable to use your device regularly due to missing supplies or machine being broken.   Work on maintaining a healthy weight and following your recommended nutrition plan   Maintain proper daily exercise and movement   Maintaining proper use of your device can also help improve management of other chronic illnesses such as: Blood pressure, blood sugars, and weight management.   BiPAP/ CPAP Cleaning:  >>>Clean weekly, with Dawn soap, and bottle brush.  Set up to air dry.  Return in about 1 year (around 08/19/2019), or if symptoms worsen or fail to improve, for Follow up with Dr. Elsworth Soho, Follow up with [name] FNP-C.   Coronavirus (COVID-19) Are you at risk?  Are you at risk for the Coronavirus (COVID-19)?  To be considered HIGH RISK for Coronavirus (COVID-19), you have to meet the following criteria:  . Traveled to [place], [place], [place], [place] or [place]; or in the [place] to [place], [place], [place], or [place]; and have fever, cough, and shortness of breath within the last 2 weeks of travel OR . Been in close contact with a person diagnosed with COVID-19 within the last 2 weeks and have fever, cough, and shortness of breath . IF YOU DO NOT MEET THESE CRITERIA, YOU ARE CONSIDERED LOW RISK FOR COVID-19.  What to do if you are HIGH RISK for COVID-19?  Marland Kitchen If you are having a medical emergency, call 911. . Seek medical care right away. Before you go to a doctor's office, urgent care or emergency department, call ahead and tell  them about your recent travel, contact with someone diagnosed with COVID-19, and your symptoms. You should receive instructions from your physician's office regarding next steps of care.  . When you arrive at healthcare provider, tell the healthcare staff immediately you have returned from visiting [place], [place], [place], [place] or [place]; or traveled in the [place] to [place], [place], [place], or [place]; in the last two weeks or you have been in close contact with a person diagnosed with COVID-19 in the last 2 weeks.   . Tell the health care staff about your symptoms: fever, cough and shortness of breath. . After you have been seen by a medical provider, you will be either: o Tested for (COVID-19) and discharged home on quarantine except to seek medical care if symptoms worsen, and asked to  - Stay home and avoid contact with others until you get your results (4-5 days)  - Avoid travel on public transportation if possible (such as bus, train, or airplane) or o Sent to the Emergency Department by EMS for evaluation, COVID-19 testing, and possible admission depending on your condition and test results.  What to do if you are LOW RISK for COVID-19?  Reduce your risk of any infection by using the same precautions used for avoiding the common cold or flu:  Marland Kitchen Wash your hands often with soap and warm water for at least 20 seconds.  If soap and water are not readily available, use an alcohol-based hand sanitizer with at least 60% alcohol.  . If coughing or sneezing, cover your mouth and nose by coughing or sneezing into the elbow areas of your shirt or coat, into a tissue or into your sleeve (not your hands). . Avoid shaking hands with others and consider head nods or verbal greetings only. . Avoid touching your eyes, nose, or mouth with unwashed hands.  . Avoid close contact with people who are sick. . Avoid places or events with large numbers of people in one location, like concerts or  sporting events. . Carefully consider travel plans you have or are making. . If you are planning any travel outside or inside the [place], visit the CDC's Travelers' Health webpage for the latest health notices. . If you have some symptoms but not all symptoms, continue to monitor at home and seek medical attention if your symptoms worsen. . If you are having a medical emergency, call 911.   [place] / e-Visit: [email or web address]         [facility]: [facility]: [phone]                   [facility]: [phone]           It is flu season:   >>> Best ways to protect herself from the flu: Receive the yearly flu vaccine, practice good hand hygiene washing with soap and also using hand sanitizer when available, eat a nutritious meals, get adequate rest, hydrate appropriately   Please contact the office if your symptoms worsen or you have concerns that you are not improving.   Thank you for choosing [facility] for your healthcare, and for allowing [place] to partner with you on your healthcare journey. I am thankful to be able to provide care to you today.   [name] FNP-C

## 2018-08-19 NOTE — Telephone Encounter (Signed)
Pt states bipap broke last night. Needs new order and ov notes to support. Scheduled televisit this pm Nothing further needed.

## 2018-08-19 NOTE — Assessment & Plan Note (Signed)
Assessment: Patient reports that he uses BiPAP every night Patient reports his BiPAP stopped working is broken as of 08/18/2018 2013 sleep study with an AHI of 91 Patient reports that he feels a difference when he does not use his BiPAP  Plan: Patient needs a new BiPAP today We will order 68-month compliance report Patient can cancel upcoming appointment with me Follow-up with our office in 1 year

## 2018-08-19 NOTE — Progress Notes (Signed)
Virtual Visit via Telephone Note  I connected with [name] on 08/19/18 at  3:30 PM EDT by telephone and verified that I am speaking with the correct person using two identifiers.  Location: Patient: Home Provider: [facility] - [address] [place], [place], [place] [address]   I discussed the limitations, risks, security and privacy concerns of performing an evaluation and management service by telephone and the availability of in person appointments. I also discussed with the patient that there may be a patient responsible charge related to this service. The patient expressed understanding and agreed to proceed.  Patient consented to consult via telephone: Yes People present and their role in pt care: Pt   History of Present Illness: 73 year old former smoker followed in our office for severe obstructive sleep apnea managed on BiPAP therapy. Pt of: Dr Elsworth Soho   Chief complaint: Severe OSA   73 year old male former smoker on our office for severe obstructive sleep apnea completing a telephonic visit with our office today due to the fact that his BiPAP stopped working on 08/18/2018.  Patient reported this issue to his DME company and they reported that he needed an office visit as well as a compliance report documenting his use in order to obtain a new BiPAP.  Patient reporting that he did not sleep with his BiPAP for the first time in many years last night and he noticed immediately that he has very poor quality sleep.  He would like to resume BiPAP therapy as soon as possible.  CPAP compliance report shows excellent compliance.  See compliance report listed below:  07/20/2018-08/18/2018-CPAP compliance report-30 in the last 30 days use, 29 those days greater than 4 hours, average usage 12 hours and 9 minutes, IPAP 11, EPAP 7, AHI 2  Observations/Objective:  NPSG 02/2012: AHI 91/hr.  Auto 2014: Optimal pressure 15cm Poor CPAP tolerance  Bilevel titration study  05/2013: 11/7cm, +++limb movements with 7/hr with arousal or awakening.   PFT  08/07/16: FVC 3.27 L (75%) FEV1 2.82 L (88%) FEV1/FVC 0.86  negative bronchodilator response TLC 5.36 L (77%) DLCO uncorrected 53%  10/02/11: FVC 3.53 L (76%) FEV1 2.96 L (86%) FEV1/FVC 0.84 FEF 25-75 3.38 L (124%) negative bronchodilator response TLC 6.73 L (95%) RV 120%                DLCO uncorrected 64%  Assessment and Plan:  Obstructive sleep apnea Assessment: Patient reports that he uses BiPAP every night Patient reports his BiPAP stopped working is broken as of 08/18/2018 2013 sleep study with an AHI of 91 Patient reports that he feels a difference when he does not use his BiPAP  Plan: Patient needs a new BiPAP today We will order 94-month compliance report Patient can cancel upcoming appointment with me Follow-up with our office in 1 year   Follow Up Instructions:  Return in about 1 year (around 08/19/2019), or if symptoms worsen or fail to improve, for Follow up with Dr. Elsworth Soho, Follow up with [name] FNP-C.   I discussed the assessment and treatment plan with the patient. The patient was provided an opportunity to ask questions and all were answered. The patient agreed with the plan and demonstrated an understanding of the instructions.   The patient was advised to call back or seek an in-person evaluation if the symptoms worsen or if the condition fails to improve as anticipated.  I provided 15 minutes of non-face-to-face time during this encounter.  Note: Patient is adamant that DME company is [employer], our records show the patient switch to [facility].  [facility] also confirms that they have the patient.  Patient would like to remain with [employer] he would like the order sent to [place].  We will place the order as the patient is requesting.   Lauraine Rinne, NP

## 2018-08-20 ENCOUNTER — Telehealth: Payer: Self-pay | Admitting: Pulmonary Disease

## 2018-08-20 DIAGNOSIS — G4733 Obstructive sleep apnea (adult) (pediatric): Secondary | ICD-10-CM

## 2018-08-20 NOTE — Telephone Encounter (Signed)
New bipap order placed. Will cancel order placed under BM and enter another order under RA. Called Estill Bamberg at [facility] to make aware order placed. An APP is not able to sign order certain DME orders ie: new cpap must be signed by MD.

## 2018-08-20 NOTE — Telephone Encounter (Signed)
-  05/26/2018-06/24/2018- BiPAP compliance report-showing 30 out of 30 days use, all 30 those days greater than 4 hours, average usage 12 hours and 3 minutes, IPAP 11, EPAP 7, AHI 1.4 -07/20/2018-08/18/2018-CPAP compliance report-30 in the last 30 days use, 29 those days greater than 4 hours, average usage 12 hours and 9 minutes, IPAP 11, EPAP 7, AHI 2

## 2018-08-21 ENCOUNTER — Ambulatory Visit (INDEPENDENT_AMBULATORY_CARE_PROVIDER_SITE_OTHER): Payer: Medicare Other

## 2018-08-21 DIAGNOSIS — E538 Deficiency of other specified B group vitamins: Secondary | ICD-10-CM

## 2018-08-21 MED ORDER — CYANOCOBALAMIN 1000 MCG/ML IJ SOLN
1000.0000 ug | Freq: Once | INTRAMUSCULAR | Status: AC
  Administered 2018-08-21: 1000 ug via INTRAMUSCULAR

## 2018-08-21 NOTE — Progress Notes (Signed)
Per orders of Dr. [name], injection of Vitamin B12 given by Diamond Nickel, RN. Administered to Right deltoid.   Patient tolerated injection well.

## 2018-08-25 ENCOUNTER — Other Ambulatory Visit: Payer: Self-pay | Admitting: Family Medicine

## 2018-08-25 DIAGNOSIS — E538 Deficiency of other specified B group vitamins: Secondary | ICD-10-CM

## 2018-08-25 DIAGNOSIS — E1149 Type 2 diabetes mellitus with other diabetic neurological complication: Secondary | ICD-10-CM

## 2018-08-26 ENCOUNTER — Ambulatory Visit: Payer: Medicare Other | Admitting: Pulmonary Disease

## 2018-08-26 ENCOUNTER — Other Ambulatory Visit (INDEPENDENT_AMBULATORY_CARE_PROVIDER_SITE_OTHER): Payer: Medicare Other

## 2018-08-26 DIAGNOSIS — E1149 Type 2 diabetes mellitus with other diabetic neurological complication: Secondary | ICD-10-CM | POA: Diagnosis not present

## 2018-08-26 DIAGNOSIS — E538 Deficiency of other specified B group vitamins: Secondary | ICD-10-CM | POA: Diagnosis not present

## 2018-08-26 LAB — VITAMIN B12: Vitamin B-12: 986 pg/mL — ABNORMAL HIGH (ref 211–911)

## 2018-08-26 LAB — HEMOGLOBIN A1C: Hgb A1c MFr Bld: 6.9 % — ABNORMAL HIGH (ref 4.6–6.5)

## 2018-08-28 ENCOUNTER — Encounter: Payer: Self-pay | Admitting: Family Medicine

## 2018-08-28 ENCOUNTER — Ambulatory Visit (INDEPENDENT_AMBULATORY_CARE_PROVIDER_SITE_OTHER): Payer: Medicare Other | Admitting: Family Medicine

## 2018-08-28 DIAGNOSIS — D352 Benign neoplasm of pituitary gland: Secondary | ICD-10-CM | POA: Diagnosis not present

## 2018-08-28 DIAGNOSIS — E1149 Type 2 diabetes mellitus with other diabetic neurological complication: Secondary | ICD-10-CM

## 2018-08-28 DIAGNOSIS — E538 Deficiency of other specified B group vitamins: Secondary | ICD-10-CM

## 2018-08-28 DIAGNOSIS — R4586 Emotional lability: Secondary | ICD-10-CM

## 2018-08-28 DIAGNOSIS — G629 Polyneuropathy, unspecified: Secondary | ICD-10-CM | POA: Diagnosis not present

## 2018-08-28 MED ORDER — CYANOCOBALAMIN 1000 MCG/ML IJ SOLN: 1000.0000 ug | INTRAMUSCULAR | Status: DC

## 2018-08-28 MED ORDER — GABAPENTIN 300 MG PO CAPS: 600.0000 mg | ORAL_CAPSULE | Freq: Two times a day (BID) | ORAL | Status: DC

## 2018-08-28 NOTE — Progress Notes (Signed)
Virtual visit completed through WebEx or similar program Patient location: home  Provider location: [place] at [facility], office   Limitations and rationale for visit method d/w patient.  Patient agreed to proceed.   CC: f/u  HPI:  Pandemic consideration d/w pt.    Diabetes:  Using medications without difficulties:yes Hypoglycemic episodes: rare lower sugars, cautions d/w pt. If any more episodes then he can stop glipiZIDE and update me.  Hyperglycemic episodes: no Feet problems:  See below re: foot pain.  Blood Sugars averaging: usually ~130s or higher.   eye exam within last year: d/w pt. Can be done when possible.   A1c increased from prev but still controlled. D/w pt about schedule and diet and exercise changes with pandemic.    B12 not low, d/w pt. Improved from prev.  He could tell some improvement with B12 replacement, with more energy.  It may be reasonable to cut back to once a month for now with recheck later on.    Taking 2 gabapentin BID for foot pain but still with pain.  Burning pain, presumed from neuropathy.  Gabapentin helped the leg pain some but less with the feet.  B feet/BLE affected.    Mood d/w pt.  "doing pretty well" with accommodations given the pandemic.  D/w pt.  No ADE on med.     He has neurosurgery f/u pending for summer 2020.    PMH and SH reviewed  ROS: Per HPI unless specifically indicated in ROS section   Meds and allergies reviewed.   NAD Speech wnl  A/P:  B12 replacement.  It may be reasonable to cut back to once a month for now. We can recheck a level later on.  He agrees.    Neuropathy.  Reasonable to inc gabapentin to 3 tabs BID as needed.    Diabetes:  A1c increased from prev but still controlled. D/w pt about schedule and diet and exercise changes with pandemic.  Needs f/u in 3 months with labs ahead of time.    Mood d/w pt.  "doing pretty well" with accommodations given the pandemic.  D/w pt.  No ADE on med.     He has  neurosurgery f/u pending for summer 2020.  I'll defer.  He agrees.

## 2018-08-31 ENCOUNTER — Encounter: Payer: Self-pay | Admitting: Family Medicine

## 2018-08-31 NOTE — Assessment & Plan Note (Signed)
Reasonable to inc gabapentin to 3 tabs BID as needed.

## 2018-08-31 NOTE — Assessment & Plan Note (Signed)
A1c increased from prev but still controlled. D/w pt about schedule and diet and exercise changes with pandemic.  Needs f/u in 3 months with labs ahead of time.

## 2018-08-31 NOTE — Assessment & Plan Note (Signed)
He has neurosurgery f/u pending for summer 2020.  I'll defer.  He agrees.

## 2018-08-31 NOTE — Assessment & Plan Note (Signed)
Mood d/w pt.  "doing pretty well" with accommodations given the pandemic.  D/w pt.  No ADE on med.

## 2018-08-31 NOTE — Assessment & Plan Note (Signed)
B12 replacement.  It may be reasonable to cut back to once a month for now. We can recheck a level later on.  He agrees.

## 2018-09-01 ENCOUNTER — Telehealth: Payer: Self-pay | Admitting: Family Medicine

## 2018-09-01 NOTE — Telephone Encounter (Signed)
Lvm pt needs to call us back to schedule appt.   Message  Received: Yesterday  Message Contents  [name], MD  Genella Rife H        Needs f/u in 3 months with labs ahead of time

## 2018-09-04 ENCOUNTER — Ambulatory Visit (INDEPENDENT_AMBULATORY_CARE_PROVIDER_SITE_OTHER): Payer: Medicare Other

## 2018-09-04 DIAGNOSIS — E538 Deficiency of other specified B group vitamins: Secondary | ICD-10-CM | POA: Diagnosis not present

## 2018-09-04 MED ORDER — CYANOCOBALAMIN 1000 MCG/ML IJ SOLN
1000.0000 ug | Freq: Once | INTRAMUSCULAR | Status: AC
  Administered 2018-09-04: 1000 ug via INTRAMUSCULAR

## 2018-09-04 NOTE — Progress Notes (Signed)
Per orders of Dr. [name], injection of Vitamin B12 given by Diamond Nickel, RN.  Administered to Left deltoid.  Patient tolerated injection well.

## 2018-09-19 ENCOUNTER — Other Ambulatory Visit: Payer: Self-pay | Admitting: Family Medicine

## 2018-09-24 DIAGNOSIS — H353131 Nonexudative age-related macular degeneration, bilateral, early dry stage: Secondary | ICD-10-CM | POA: Diagnosis not present

## 2018-09-24 LAB — HM DIABETES EYE EXAM

## 2018-09-25 ENCOUNTER — Encounter: Payer: Self-pay | Admitting: Family Medicine

## 2018-09-26 ENCOUNTER — Other Ambulatory Visit: Payer: Self-pay | Admitting: Neurosurgery

## 2018-09-26 ENCOUNTER — Other Ambulatory Visit (HOSPITAL_COMMUNITY): Payer: Self-pay | Admitting: Neurosurgery

## 2018-09-26 DIAGNOSIS — D352 Benign neoplasm of pituitary gland: Secondary | ICD-10-CM

## 2018-09-30 ENCOUNTER — Other Ambulatory Visit: Payer: Self-pay | Admitting: Family Medicine

## 2018-09-30 MED ORDER — GABAPENTIN 300 MG PO CAPS: ORAL_CAPSULE | ORAL | 1 refills | Status: DC

## 2018-09-30 MED ORDER — PREGABALIN 25 MG PO CAPS: 25.0000 mg | ORAL_CAPSULE | Freq: Two times a day (BID) | ORAL | 1 refills | Status: DC

## 2018-09-30 MED ORDER — GABAPENTIN 300 MG PO CAPS: ORAL_CAPSULE | ORAL | Status: DC

## 2018-09-30 NOTE — Progress Notes (Signed)
See my chart message

## 2018-10-07 ENCOUNTER — Ambulatory Visit (INDEPENDENT_AMBULATORY_CARE_PROVIDER_SITE_OTHER): Payer: Medicare Other

## 2018-10-07 DIAGNOSIS — E538 Deficiency of other specified B group vitamins: Secondary | ICD-10-CM

## 2018-10-07 MED ORDER — CYANOCOBALAMIN 1000 MCG/ML IJ SOLN
1000.0000 ug | Freq: Once | INTRAMUSCULAR | Status: AC
  Administered 2018-10-07: 15:00:00 1000 ug via INTRAMUSCULAR

## 2018-10-07 NOTE — Progress Notes (Signed)
Pt received monthly B12 injection in right deltoid. Tolerated well.  Sent to Dr Danise Mina to sign in Dr Josefine Class absence.

## 2018-10-10 ENCOUNTER — Ambulatory Visit (HOSPITAL_COMMUNITY)
Admission: RE | Admit: 2018-10-10 | Discharge: 2018-10-10 | Disposition: A | Discharge status: Home / Self Care | Payer: Medicare Other | Source: Ambulatory Visit | Attending: Neurosurgery | Admitting: Neurosurgery

## 2018-10-10 ENCOUNTER — Other Ambulatory Visit: Payer: Self-pay

## 2018-10-10 DIAGNOSIS — D352 Benign neoplasm of pituitary gland: Secondary | ICD-10-CM | POA: Insufficient documentation

## 2018-10-10 LAB — POCT I-STAT CREATININE: Creatinine, Ser: 0.9 mg/dL (ref 0.61–1.24)

## 2018-10-10 MED ORDER — GADOBUTROL 1 MMOL/ML IV SOLN
10.0000 mL | Freq: Once | INTRAVENOUS | Status: AC | PRN
  Administered 2018-10-10: 16:00:00 10 mL via INTRAVENOUS

## 2018-10-17 ENCOUNTER — Other Ambulatory Visit: Payer: Self-pay | Admitting: Family Medicine

## 2018-10-17 DIAGNOSIS — D352 Benign neoplasm of pituitary gland: Secondary | ICD-10-CM | POA: Diagnosis not present

## 2018-10-20 NOTE — Telephone Encounter (Signed)
Electronic refill request. Gabapentin Last office visit:   08/28/2018 Last Filled:    30 capsule 1 09/30/2018  Please advise.

## 2018-10-21 ENCOUNTER — Encounter: Payer: Self-pay | Admitting: Family Medicine

## 2018-10-21 NOTE — Telephone Encounter (Signed)
Left detailed message on voicemail to return call with information. 

## 2018-10-21 NOTE — Telephone Encounter (Signed)
Please get update on patient.  I thought he was tapering off gabapentin and starting Lyrica.  Thanks.

## 2018-10-21 NOTE — Telephone Encounter (Signed)
Spoke with pt  He stated he is not taking gabapentin  He started lyrica.  He does not need refill on lyrica

## 2018-10-26 ENCOUNTER — Encounter: Payer: Self-pay | Admitting: Family Medicine

## 2018-10-27 ENCOUNTER — Other Ambulatory Visit: Payer: Self-pay | Admitting: Family Medicine

## 2018-10-27 MED ORDER — PREGABALIN 25 MG PO CAPS: ORAL_CAPSULE | ORAL | Status: DC

## 2018-10-28 ENCOUNTER — Ambulatory Visit (INDEPENDENT_AMBULATORY_CARE_PROVIDER_SITE_OTHER): Payer: Medicare Other | Admitting: Rheumatology

## 2018-10-28 ENCOUNTER — Ambulatory Visit (INDEPENDENT_AMBULATORY_CARE_PROVIDER_SITE_OTHER): Payer: Medicare Other

## 2018-10-28 ENCOUNTER — Other Ambulatory Visit: Payer: Self-pay

## 2018-10-28 ENCOUNTER — Encounter: Payer: Self-pay | Admitting: Rheumatology

## 2018-10-28 VITALS — BP 158/88 | HR 51 | Resp 15 | Ht 69.5 in | Wt 283.4 lb

## 2018-10-28 DIAGNOSIS — L409 Psoriasis, unspecified: Secondary | ICD-10-CM | POA: Diagnosis not present

## 2018-10-28 DIAGNOSIS — Z862 Personal history of diseases of the blood and blood-forming organs and certain disorders involving the immune mechanism: Secondary | ICD-10-CM

## 2018-10-28 DIAGNOSIS — Z87448 Personal history of other diseases of urinary system: Secondary | ICD-10-CM

## 2018-10-28 DIAGNOSIS — M19042 Primary osteoarthritis, left hand: Secondary | ICD-10-CM

## 2018-10-28 DIAGNOSIS — M19071 Primary osteoarthritis, right ankle and foot: Secondary | ICD-10-CM

## 2018-10-28 DIAGNOSIS — M19041 Primary osteoarthritis, right hand: Secondary | ICD-10-CM | POA: Diagnosis not present

## 2018-10-28 DIAGNOSIS — M19072 Primary osteoarthritis, left ankle and foot: Secondary | ICD-10-CM

## 2018-10-28 DIAGNOSIS — I472 Ventricular tachycardia, unspecified: Secondary | ICD-10-CM

## 2018-10-28 DIAGNOSIS — Z8669 Personal history of other diseases of the nervous system and sense organs: Secondary | ICD-10-CM

## 2018-10-28 DIAGNOSIS — M25531 Pain in right wrist: Secondary | ICD-10-CM

## 2018-10-28 DIAGNOSIS — L4050 Arthropathic psoriasis, unspecified: Secondary | ICD-10-CM

## 2018-10-28 DIAGNOSIS — Z8639 Personal history of other endocrine, nutritional and metabolic disease: Secondary | ICD-10-CM

## 2018-10-28 DIAGNOSIS — Z8679 Personal history of other diseases of the circulatory system: Secondary | ICD-10-CM | POA: Diagnosis not present

## 2018-10-28 DIAGNOSIS — G2581 Restless legs syndrome: Secondary | ICD-10-CM

## 2018-10-28 DIAGNOSIS — Z79899 Other long term (current) drug therapy: Secondary | ICD-10-CM

## 2018-10-28 DIAGNOSIS — Z96653 Presence of artificial knee joint, bilateral: Secondary | ICD-10-CM | POA: Diagnosis not present

## 2018-10-28 MED ORDER — LIDOCAINE HCL 1 % IJ SOLN
1.0000 mL | INTRAMUSCULAR | Status: AC | PRN
  Administered 2018-10-28: 1 mL

## 2018-10-28 MED ORDER — TRIAMCINOLONE ACETONIDE 40 MG/ML IJ SUSP
30.0000 mg | INTRAMUSCULAR | Status: AC | PRN
  Administered 2018-10-28: 30 mg via INTRA_ARTICULAR
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
## 2018-10-31 ENCOUNTER — Emergency Department (HOSPITAL_COMMUNITY): Payer: Medicare Other

## 2018-10-31 ENCOUNTER — Other Ambulatory Visit: Payer: Self-pay | Admitting: Family Medicine

## 2018-10-31 ENCOUNTER — Observation Stay (HOSPITAL_COMMUNITY)
Admission: EM | Admit: 2018-10-31 | Discharge: 2018-11-01 | Disposition: A | Discharge status: Home / Self Care | Payer: Medicare Other | Attending: Cardiovascular Disease | Admitting: Cardiovascular Disease

## 2018-10-31 DIAGNOSIS — K219 Gastro-esophageal reflux disease without esophagitis: Secondary | ICD-10-CM | POA: Insufficient documentation

## 2018-10-31 DIAGNOSIS — E1149 Type 2 diabetes mellitus with other diabetic neurological complication: Secondary | ICD-10-CM | POA: Diagnosis not present

## 2018-10-31 DIAGNOSIS — I5033 Acute on chronic diastolic (congestive) heart failure: Secondary | ICD-10-CM

## 2018-10-31 DIAGNOSIS — Z1159 Encounter for screening for other viral diseases: Secondary | ICD-10-CM | POA: Diagnosis not present

## 2018-10-31 DIAGNOSIS — Z79899 Other long term (current) drug therapy: Secondary | ICD-10-CM | POA: Diagnosis not present

## 2018-10-31 DIAGNOSIS — G4733 Obstructive sleep apnea (adult) (pediatric): Secondary | ICD-10-CM | POA: Insufficient documentation

## 2018-10-31 DIAGNOSIS — I472 Ventricular tachycardia: Secondary | ICD-10-CM

## 2018-10-31 DIAGNOSIS — I5023 Acute on chronic systolic (congestive) heart failure: Secondary | ICD-10-CM | POA: Diagnosis not present

## 2018-10-31 DIAGNOSIS — Z6841 Body Mass Index (BMI) 40.0 and over, adult: Secondary | ICD-10-CM | POA: Insufficient documentation

## 2018-10-31 DIAGNOSIS — E785 Hyperlipidemia, unspecified: Secondary | ICD-10-CM | POA: Insufficient documentation

## 2018-10-31 DIAGNOSIS — E559 Vitamin D deficiency, unspecified: Secondary | ICD-10-CM | POA: Insufficient documentation

## 2018-10-31 DIAGNOSIS — I4819 Other persistent atrial fibrillation: Secondary | ICD-10-CM | POA: Diagnosis not present

## 2018-10-31 DIAGNOSIS — I2510 Atherosclerotic heart disease of native coronary artery without angina pectoris: Secondary | ICD-10-CM | POA: Insufficient documentation

## 2018-10-31 DIAGNOSIS — I110 Hypertensive heart disease with heart failure: Principal | ICD-10-CM | POA: Insufficient documentation

## 2018-10-31 DIAGNOSIS — F329 Major depressive disorder, single episode, unspecified: Secondary | ICD-10-CM | POA: Diagnosis not present

## 2018-10-31 DIAGNOSIS — Z87891 Personal history of nicotine dependence: Secondary | ICD-10-CM | POA: Diagnosis not present

## 2018-10-31 DIAGNOSIS — M1990 Unspecified osteoarthritis, unspecified site: Secondary | ICD-10-CM | POA: Insufficient documentation

## 2018-10-31 DIAGNOSIS — I509 Heart failure, unspecified: Secondary | ICD-10-CM | POA: Diagnosis present

## 2018-10-31 DIAGNOSIS — Z20828 Contact with and (suspected) exposure to other viral communicable diseases: Secondary | ICD-10-CM | POA: Diagnosis not present

## 2018-10-31 DIAGNOSIS — Z8673 Personal history of transient ischemic attack (TIA), and cerebral infarction without residual deficits: Secondary | ICD-10-CM | POA: Diagnosis not present

## 2018-10-31 DIAGNOSIS — Z8249 Family history of ischemic heart disease and other diseases of the circulatory system: Secondary | ICD-10-CM | POA: Insufficient documentation

## 2018-10-31 DIAGNOSIS — Z7901 Long term (current) use of anticoagulants: Secondary | ICD-10-CM | POA: Insufficient documentation

## 2018-10-31 DIAGNOSIS — Z7951 Long term (current) use of inhaled steroids: Secondary | ICD-10-CM | POA: Diagnosis not present

## 2018-10-31 DIAGNOSIS — L4050 Arthropathic psoriasis, unspecified: Secondary | ICD-10-CM | POA: Insufficient documentation

## 2018-10-31 DIAGNOSIS — G2581 Restless legs syndrome: Secondary | ICD-10-CM | POA: Insufficient documentation

## 2018-10-31 DIAGNOSIS — R079 Chest pain, unspecified: Secondary | ICD-10-CM | POA: Diagnosis not present

## 2018-10-31 DIAGNOSIS — F4310 Post-traumatic stress disorder, unspecified: Secondary | ICD-10-CM | POA: Diagnosis not present

## 2018-10-31 DIAGNOSIS — I4510 Unspecified right bundle-branch block: Secondary | ICD-10-CM | POA: Insufficient documentation

## 2018-10-31 DIAGNOSIS — D352 Benign neoplasm of pituitary gland: Secondary | ICD-10-CM | POA: Insufficient documentation

## 2018-10-31 DIAGNOSIS — I482 Chronic atrial fibrillation, unspecified: Secondary | ICD-10-CM | POA: Diagnosis not present

## 2018-10-31 DIAGNOSIS — I5031 Acute diastolic (congestive) heart failure: Secondary | ICD-10-CM | POA: Diagnosis not present

## 2018-10-31 LAB — BASIC METABOLIC PANEL
Anion gap: 12 (ref 5–15)
BUN: 20 mg/dL (ref 8–23)
CO2: 22 mmol/L (ref 22–32)
Calcium: 9.1 mg/dL (ref 8.9–10.3)
Chloride: 105 mmol/L (ref 98–111)
Creatinine, Ser: 0.94 mg/dL (ref 0.61–1.24)
GFR calc Af Amer: >60 mL/min (ref >60)
GFR calc non Af Amer: >60 mL/min (ref >60)
Glucose, Bld: 146 mg/dL — ABNORMAL HIGH (ref 70–99)
Potassium: 3.9 mmol/L (ref 3.5–5.1)
Sodium: 139 mmol/L (ref 135–145)

## 2018-10-31 LAB — GLUCOSE, CAPILLARY: Glucose-Capillary: 165 mg/dL — ABNORMAL HIGH (ref 70–99)

## 2018-10-31 LAB — CBG MONITORING, ED: Glucose-Capillary: 158 mg/dL — ABNORMAL HIGH (ref 70–99)

## 2018-10-31 LAB — CBC
HCT: 35.8 % — ABNORMAL LOW (ref 39.0–52.0)
Hemoglobin: 12 g/dL — ABNORMAL LOW (ref 13.0–17.0)
MCH: 31.3 pg (ref 26.0–34.0)
MCHC: 33.5 g/dL (ref 30.0–36.0)
MCV: 93.5 fL (ref 80.0–100.0)
Platelets: 260 10*3/uL (ref 150–400)
RBC: 3.83 MIL/uL — ABNORMAL LOW (ref 4.22–5.81)
RDW: 12.7 % (ref 11.5–15.5)
WBC: 12.8 10*3/uL — ABNORMAL HIGH (ref 4.0–10.5)
nRBC: 0 % (ref 0.0–0.2)

## 2018-10-31 LAB — SARS CORONAVIRUS 2 BY RT PCR (HOSPITAL ORDER, PERFORMED IN ~~LOC~~ HOSPITAL LAB): SARS Coronavirus 2: NEGATIVE

## 2018-10-31 LAB — TROPONIN I (HIGH SENSITIVITY)
Troponin I (High Sensitivity): 21 ng/L — ABNORMAL HIGH (ref <18)
Troponin I (High Sensitivity): 19 ng/L — ABNORMAL HIGH (ref <18)

## 2018-10-31 LAB — BRAIN NATRIURETIC PEPTIDE: B Natriuretic Peptide: 450.9 pg/mL — ABNORMAL HIGH (ref 0.0–100.0)

## 2018-10-31 LAB — TSH: TSH: 3.544 u[IU]/mL (ref 0.350–4.500)

## 2018-10-31 MED ORDER — FLUOXETINE HCL 10 MG PO CAPS
30.0000 mg | ORAL_CAPSULE | Freq: Every morning | ORAL | Status: DC
  Administered 2018-11-01: 08:00:00 30 mg via ORAL
  Filled 2018-10-31: qty 2
  Filled 2018-10-31: qty 3

## 2018-10-31 MED ORDER — OCUVITE-LUTEIN PO CAPS
1.0000 | ORAL_CAPSULE | Freq: Two times a day (BID) | ORAL | Status: DC
  Filled 2018-10-31: qty 1

## 2018-10-31 MED ORDER — INSULIN ASPART 100 UNIT/ML ~~LOC~~ SOLN: 0.0000 [IU] | Freq: Every day | SUBCUTANEOUS | Status: DC

## 2018-10-31 MED ORDER — ZOLPIDEM TARTRATE 5 MG PO TABS: 5.0000 mg | ORAL_TABLET | Freq: Every evening | ORAL | Status: DC | PRN

## 2018-10-31 MED ORDER — METFORMIN HCL ER 750 MG PO TB24
750.0000 mg | ORAL_TABLET | Freq: Two times a day (BID) | ORAL | Status: DC
  Filled 2018-10-31 (×2): qty 1

## 2018-10-31 MED ORDER — SODIUM CHLORIDE 0.9% FLUSH
3.0000 mL | Freq: Two times a day (BID) | INTRAVENOUS | Status: DC
  Administered 2018-10-31 – 2018-11-01 (×2): 3 mL via INTRAVENOUS

## 2018-10-31 MED ORDER — LISINOPRIL 2.5 MG PO TABS
2.5000 mg | ORAL_TABLET | Freq: Every evening | ORAL | Status: DC
  Administered 2018-10-31: 2.5 mg via ORAL
  Filled 2018-10-31: qty 1

## 2018-10-31 MED ORDER — SODIUM CHLORIDE 0.9% FLUSH: 3.0000 mL | INTRAVENOUS | Status: DC | PRN

## 2018-10-31 MED ORDER — PREGABALIN 25 MG PO CAPS: 25.0000 mg | ORAL_CAPSULE | ORAL | Status: DC

## 2018-10-31 MED ORDER — FUROSEMIDE 10 MG/ML IJ SOLN
40.0000 mg | Freq: Two times a day (BID) | INTRAMUSCULAR | Status: DC
  Administered 2018-11-01: 08:00:00 40 mg via INTRAVENOUS
  Filled 2018-10-31: qty 4

## 2018-10-31 MED ORDER — SODIUM CHLORIDE 0.9 % IV SOLN: 250.0000 mL | INTRAVENOUS | Status: DC | PRN

## 2018-10-31 MED ORDER — FLUTICASONE PROPIONATE 50 MCG/ACT NA SUSP
2.0000 | Freq: Every day | NASAL | Status: DC | PRN
  Filled 2018-10-31: qty 16

## 2018-10-31 MED ORDER — CYANOCOBALAMIN 1000 MCG/ML IJ SOLN: 1000.0000 ug | INTRAMUSCULAR | Status: DC

## 2018-10-31 MED ORDER — APREMILAST 30 MG PO TABS: 1.0000 | ORAL_TABLET | Freq: Two times a day (BID) | ORAL | Status: DC

## 2018-10-31 MED ORDER — TAMSULOSIN HCL 0.4 MG PO CAPS
0.4000 mg | ORAL_CAPSULE | Freq: Every day | ORAL | Status: DC
  Administered 2018-10-31: 0.4 mg via ORAL
  Filled 2018-10-31: qty 1

## 2018-10-31 MED ORDER — PREGABALIN 25 MG PO CAPS
50.0000 mg | ORAL_CAPSULE | Freq: Every day | ORAL | Status: DC
  Administered 2018-10-31: 50 mg via ORAL
  Filled 2018-10-31: qty 2

## 2018-10-31 MED ORDER — ACEBUTOLOL HCL 200 MG PO CAPS
200.0000 mg | ORAL_CAPSULE | Freq: Two times a day (BID) | ORAL | Status: DC
  Administered 2018-10-31 – 2018-11-01 (×2): 200 mg via ORAL
  Filled 2018-10-31 (×3): qty 1

## 2018-10-31 MED ORDER — ONDANSETRON HCL 4 MG/2ML IJ SOLN: 4.0000 mg | Freq: Four times a day (QID) | INTRAMUSCULAR | Status: DC | PRN

## 2018-10-31 MED ORDER — ALPRAZOLAM 0.25 MG PO TABS
0.2500 mg | ORAL_TABLET | Freq: Two times a day (BID) | ORAL | Status: DC | PRN
  Administered 2018-10-31: 0.25 mg via ORAL
  Filled 2018-10-31: qty 1

## 2018-10-31 MED ORDER — SODIUM CHLORIDE 0.9% FLUSH: 3.0000 mL | Freq: Once | INTRAVENOUS | Status: DC

## 2018-10-31 MED ORDER — INSULIN ASPART 100 UNIT/ML ~~LOC~~ SOLN
0.0000 [IU] | Freq: Three times a day (TID) | SUBCUTANEOUS | Status: DC
  Administered 2018-11-01 (×2): 2 [IU] via SUBCUTANEOUS

## 2018-10-31 MED ORDER — PRAVASTATIN SODIUM 10 MG PO TABS
20.0000 mg | ORAL_TABLET | Freq: Every evening | ORAL | Status: DC
  Administered 2018-10-31: 20 mg via ORAL
  Filled 2018-10-31: qty 2

## 2018-10-31 MED ORDER — FUROSEMIDE 10 MG/ML IJ SOLN
40.0000 mg | Freq: Once | INTRAMUSCULAR | Status: AC
  Administered 2018-10-31: 40 mg via INTRAVENOUS
  Filled 2018-10-31: qty 4

## 2018-10-31 MED ORDER — PREGABALIN 25 MG PO CAPS
25.0000 mg | ORAL_CAPSULE | Freq: Every morning | ORAL | Status: DC
  Administered 2018-11-01: 08:00:00 25 mg via ORAL
  Filled 2018-10-31: qty 1

## 2018-10-31 MED ORDER — GLIPIZIDE 5 MG PO TABS
5.0000 mg | ORAL_TABLET | Freq: Every day | ORAL | Status: DC
  Administered 2018-11-01: 07:00:00 5 mg via ORAL
  Filled 2018-10-31: qty 1

## 2018-10-31 MED ORDER — PROSIGHT PO TABS
1.0000 | ORAL_TABLET | Freq: Two times a day (BID) | ORAL | Status: DC
  Administered 2018-10-31 – 2018-11-01 (×2): 1 via ORAL
  Filled 2018-10-31 (×2): qty 1

## 2018-10-31 MED ORDER — MELATONIN 3 MG PO TABS
3.0000 mg | ORAL_TABLET | Freq: Every day | ORAL | Status: DC
  Administered 2018-10-31: 3 mg via ORAL
  Filled 2018-10-31 (×2): qty 1

## 2018-10-31 MED ORDER — ROPINIROLE HCL 1 MG PO TABS
1.0000 mg | ORAL_TABLET | Freq: Every day | ORAL | Status: DC
  Administered 2018-10-31: 1 mg via ORAL
  Filled 2018-10-31: qty 1

## 2018-10-31 MED ORDER — POTASSIUM CHLORIDE CRYS ER 20 MEQ PO TBCR
20.0000 meq | EXTENDED_RELEASE_TABLET | Freq: Two times a day (BID) | ORAL | Status: DC
  Administered 2018-10-31 – 2018-11-01 (×2): 20 meq via ORAL
  Filled 2018-10-31 (×3): qty 1

## 2018-10-31 MED ORDER — LORATADINE 10 MG PO TABS
10.0000 mg | ORAL_TABLET | Freq: Every day | ORAL | Status: DC
  Administered 2018-11-01: 10 mg via ORAL
  Filled 2018-10-31: qty 1

## 2018-10-31 MED ORDER — ASPIRIN 81 MG PO CHEW
324.0000 mg | CHEWABLE_TABLET | Freq: Once | ORAL | Status: AC
  Administered 2018-10-31: 324 mg via ORAL
  Filled 2018-10-31: qty 4

## 2018-10-31 MED ORDER — APIXABAN 5 MG PO TABS
5.0000 mg | ORAL_TABLET | Freq: Two times a day (BID) | ORAL | Status: DC
  Administered 2018-10-31 – 2018-11-01 (×2): 5 mg via ORAL
  Filled 2018-10-31 (×2): qty 1

## 2018-10-31 MED ORDER — ACETAMINOPHEN 325 MG PO TABS
650.0000 mg | ORAL_TABLET | ORAL | Status: DC | PRN
  Administered 2018-10-31: 650 mg via ORAL
  Filled 2018-10-31: qty 2

## 2018-10-31 NOTE — ED Triage Notes (Signed)
Pt states he woke up this morning at 0400 feelings suddenly unable to breathe pt was brought to ED by his wife. Pt denies any pain just feels hard to breathe.

## 2018-10-31 NOTE — ED Notes (Signed)
Report called to louoie

## 2018-10-31 NOTE — ED Notes (Signed)
Dinner requested

## 2018-10-31 NOTE — Telephone Encounter (Signed)
Dose adjustment was recently made per mychart note. Need to check and see how patient is doing on the new dose and verify how much he is taking of Lyrica. Per epic patient is currently admitted. Will need to wait

## 2018-10-31 NOTE — ED Notes (Signed)
Dinner Tray Ordered @ 1825-per Gerald Stabs, RN called by Levada Dy
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
## 2018-10-31 NOTE — ED Notes (Signed)
Pt complains of SOB since 0400 this date. Pt reports a hx of CHF.
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
## 2018-10-31 NOTE — ED Notes (Signed)
Dr Acie Fredrickson has seen this pt in the ed

## 2018-11-01 ENCOUNTER — Other Ambulatory Visit: Payer: Self-pay

## 2018-11-01 ENCOUNTER — Encounter (HOSPITAL_COMMUNITY): Payer: Self-pay

## 2018-11-01 ENCOUNTER — Inpatient Hospital Stay (HOSPITAL_COMMUNITY): Payer: Medicare Other

## 2018-11-01 DIAGNOSIS — I509 Heart failure, unspecified: Secondary | ICD-10-CM

## 2018-11-01 DIAGNOSIS — I5031 Acute diastolic (congestive) heart failure: Secondary | ICD-10-CM

## 2018-11-01 DIAGNOSIS — I361 Nonrheumatic tricuspid (valve) insufficiency: Secondary | ICD-10-CM | POA: Diagnosis not present

## 2018-11-01 DIAGNOSIS — I482 Chronic atrial fibrillation, unspecified: Secondary | ICD-10-CM | POA: Diagnosis not present

## 2018-11-01 DIAGNOSIS — I110 Hypertensive heart disease with heart failure: Secondary | ICD-10-CM | POA: Diagnosis not present

## 2018-11-01 LAB — GLUCOSE, CAPILLARY
Glucose-Capillary: 130 mg/dL — ABNORMAL HIGH (ref 70–99)
Glucose-Capillary: 132 mg/dL — ABNORMAL HIGH (ref 70–99)

## 2018-11-01 LAB — BASIC METABOLIC PANEL
Anion gap: 13 (ref 5–15)
BUN: 20 mg/dL (ref 8–23)
CO2: 25 mmol/L (ref 22–32)
Calcium: 8.9 mg/dL (ref 8.9–10.3)
Chloride: 100 mmol/L (ref 98–111)
Creatinine, Ser: 1.01 mg/dL (ref 0.61–1.24)
GFR calc Af Amer: >60 mL/min (ref >60)
GFR calc non Af Amer: >60 mL/min (ref >60)
Glucose, Bld: 100 mg/dL — ABNORMAL HIGH (ref 70–99)
Potassium: 3.4 mmol/L — ABNORMAL LOW (ref 3.5–5.1)
Sodium: 138 mmol/L (ref 135–145)

## 2018-11-01 LAB — TROPONIN I (HIGH SENSITIVITY): Troponin I (High Sensitivity): 24 ng/L — ABNORMAL HIGH (ref <18)

## 2018-11-01 LAB — ECHOCARDIOGRAM COMPLETE
Height: 69 in
Weight: 4345.71 oz

## 2018-11-01 LAB — MRSA PCR SCREENING: MRSA by PCR: NEGATIVE

## 2018-11-01 MED ORDER — TORSEMIDE 20 MG PO TABS: 20.0000 mg | ORAL_TABLET | Freq: Every day | ORAL | 2 refills | Status: DC

## 2018-11-01 MED ORDER — TORSEMIDE 20 MG PO TABS
20.0000 mg | ORAL_TABLET | Freq: Every day | ORAL | Status: DC
  Administered 2018-11-01: 13:00:00 20 mg via ORAL
  Filled 2018-11-01 (×2): qty 1

## 2018-11-01 MED ORDER — POTASSIUM CHLORIDE CRYS ER 20 MEQ PO TBCR: 20.0000 meq | EXTENDED_RELEASE_TABLET | Freq: Every day | ORAL | 1 refills | Status: DC

## 2018-11-01 NOTE — Progress Notes (Signed)
Echocardiogram 2D Echocardiogram has been performed.  [facility] Deputy Geraldina Parrott 11/01/2018, 9:16 AM

## 2018-11-01 NOTE — Plan of Care (Signed)
  Problem: Health Behavior/Discharge Planning: Goal: Ability to manage health-related needs will improve Outcome: Progressing   Problem: Clinical Measurements: Goal: Ability to maintain clinical measurements within normal limits will improve Outcome: Progressing Goal: Will remain free from infection Outcome: Progressing Goal: Diagnostic test results will improve Outcome: Progressing Goal: Respiratory complications will improve Outcome: Progressing Goal: Cardiovascular complication will be avoided Outcome: Progressing   Problem: Activity: Goal: Risk for activity intolerance will decrease Outcome: Progressing   Problem: Coping: Goal: Level of anxiety will decrease Outcome: Progressing   Problem: Elimination: Goal: Will not experience complications related to bowel motility Outcome: Progressing Goal: Will not experience complications related to urinary retention Outcome: Progressing   Problem: Pain Managment: Goal: General experience of comfort will improve Outcome: Progressing   Problem: Safety: Goal: Ability to remain free from injury will improve Outcome: Progressing   Problem: Skin Integrity: Goal: Risk for impaired skin integrity will decrease Outcome: Progressing   Problem: Education: Goal: Ability to demonstrate management of disease process will improve Outcome: Progressing Goal: Ability to verbalize understanding of medication therapies will improve Outcome: Progressing Goal: Individualized Educational Video(s) Outcome: Progressing   Problem: Activity: Goal: Capacity to carry out activities will improve Outcome: Progressing   Problem: Cardiac: Goal: Ability to achieve and maintain adequate cardiopulmonary perfusion will improve Outcome: Progressing

## 2018-11-01 NOTE — Discharge Instructions (Signed)

## 2018-11-01 NOTE — Plan of Care (Signed)
  Problem: Education: Goal: Knowledge of General Education information will improve Description: Including pain rating scale, medication(s)/side effects and non-pharmacologic comfort measures Outcome: Progressing   Problem: Health Behavior/Discharge Planning: Goal: Ability to manage health-related needs will improve Outcome: Progressing   Problem: Clinical Measurements: Goal: Ability to maintain clinical measurements within normal limits will improve Outcome: Progressing Goal: Will remain free from infection Outcome: Progressing Goal: Diagnostic test results will improve Outcome: Progressing Goal: Respiratory complications will improve Outcome: Progressing Goal: Cardiovascular complication will be avoided Outcome: Progressing   Problem: Activity: Goal: Risk for activity intolerance will decrease Outcome: Progressing   Problem: Nutrition: Goal: Adequate nutrition will be maintained Outcome: Progressing   Problem: Coping: Goal: Level of anxiety will decrease Outcome: Progressing   Problem: Pain Managment: Goal: General experience of comfort will improve Outcome: Progressing   Problem: Elimination: Goal: Will not experience complications related to bowel motility Outcome: Progressing Goal: Will not experience complications related to urinary retention Outcome: Progressing   Problem: Safety: Goal: Ability to remain free from injury will improve Outcome: Progressing   Problem: Education: Goal: Ability to demonstrate management of disease process will improve Outcome: Progressing Goal: Ability to verbalize understanding of medication therapies will improve Outcome: Progressing Goal: Individualized Educational Video(s) Outcome: Progressing   Problem: Skin Integrity: Goal: Risk for impaired skin integrity will decrease Outcome: Progressing   Problem: Activity: Goal: Capacity to carry out activities will improve Outcome: Progressing   Problem: Cardiac: Goal:  Ability to achieve and maintain adequate cardiopulmonary perfusion will improve Outcome: Progressing

## 2018-11-01 NOTE — Discharge Summary (Addendum)
Discharge Summary    Patient ID: [name],  MRN: [id number], DOB/AGE: 1945/09/16 73 y.o.  Admit date: 10/31/2018 Discharge date: 11/01/2018  Primary Care Provider: [name] Primary Cardiologist: Cristopher Peru, MD  Discharge Diagnoses    Active Problems:   Acute diastolic heart failure (HCC)   Allergies Allergies  Allergen Reactions  . 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 Verapamil Shortness Of Breath and Other (See Comments)    CP, irregular/slow HR, dizziness, heartburn, drowsiness, weakness  . Zithromax [Azithromycin Dihydrate] Swelling    Swelling (arms/legs/scrotum)  . Oxycodone Itching and Other (See Comments)    Sweats and itching- tolerates hydrocodone  . Acyclovir And Related Other (See Comments)    Told by MD  . Amiodarone Other (See Comments)    Acute lung toxicity  . Latex Itching  . Adhesive [Tape] Itching and Rash  . Amlodipine Other (See Comments)    Muscle pain    Diagnostic Studies/Procedures    TTE: 11/01/18  IMPRESSIONS    1. The left ventricle has low normal systolic function, with an ejection fraction of 50-55%. The cavity size was normal. Left ventricular diastolic function could not be evaluated secondary to atrial fibrillation.  2. The right ventricle has normal systolic function. The cavity was normal. There is no increase in right ventricular wall thickness.  3. Left atrial size was severely dilated.  4. Right atrial size was moderately dilated.  5. The mitral valve is grossly normal. Mild thickening of the mitral valve leaflet.  6. The tricuspid valve is grossly normal.  7. The aortic valve is abnormal. Mild thickening of the aortic valve. Mild calcification of the aortic valve. No stenosis of the aortic valve.  8. The aorta is normal in size and structure.  9. The aortic root and ascending  aorta are normal in size and structure. 10. The inferior vena cava was dilated in size with >50% respiratory variability. 11. The interatrial septum was not assessed. _____________   History of Present Illness     [name] is a 73 y.o. male with a hx of chronic diastolic HF (Echo 04/4479 85-63%) , Nonobstructive CAD (cath 2018), HTN, obesity, Hyperlipidemia, OSA on biPAP, Chronic AFib on eliquis,  who presented with shortness of breath.   Patient followed with Dr. Lovena Le for above cardiac problems. 07/2016 Right and left heart cath showed nonobstructive CAD Ost 1st Mrg to 1st Mrg lesion, 55% stenosed, mid Cx lesion, 30% stenosed, normal left ventricular systolic function, normal LV end pressure. Cath 5 years prior also showed Nonobstructive CAD. Patient continued to follow-up for management of chronic problems.   Patient was last seen in the office 6 months ago 03/10/2018 for follow-up for afib. Patient had 2-3 unsuccessful cardioversion in the past. Patient was doing well on amiodarone but it had to be discontinued due to toxicity. Patient also tried Verapamil but had to be discontinued for side effects. Chronically been taking Acebutolol for ventricular rhythm control. Patient had asked about ablation but was not thought to be a good candidate due to obesity. At that visit Dr. Lovena Le decided to rate control with no plan for rhythm control.   Follow-up echo 04/2017  Showed EF 50-55%.Doppler parameters are consistent with restrictive physiology, indicative of decreased left ventricular diastolic compliance and/or increased left atrial pressure. Doppler parameters are consistent with elevated  ventricular end-diastolic filling pressure. Mild AS, left atrium severely dilated, Right atrium mildly dilated, mild TR, and trivial PR.   [name] presented to the ED for progressive shortness of breath that started that morning. He was awakened out of bed at 4:00 am unable to catch his breath. Shortness of  breath progressed over a couple hours. He had associated chest tightness. He denies fever, cough, or recent illness. Denies chest pain. Admitted he had gained 5 lbs in the last couple days (281-286, baseline 276).  Potassium 3.9, Glucose 146, Creatinine 0.94, BNP 450, HS troponin 21>19, Hgb 12.0, WBC 12.8. EKG is VT vs. is afib with aberrancy, 94 bpm, RBB. COVID negative. CXR suggested mild CHF.  Patient was reportedly taking Lasix 74m daily at home. Denied missing doses of Eliquis. Patient received Lasix Injection 471min the ER.   Patient quit smoking 40 years ago. Has significant family history of heart disease. Says his activity level has been very low due to the heat. Admitted to diet noncompliance. Denies recent complications with bipap. He was admitted with plans for diuresis.  Hospital Course     1.   Acute on chronic diastolic congestive heart failure: Presented with symptoms of shortness of breath, PND, orthopnea.  He was having increased palpitations.  He has chronic atrial fibrillation with aberrant conduction but the atrial fibrillation is basically asymptomatic. He was placed on torsemide 207mn admission with good diuresis, 3.5L. Weight dropped by 3lbs. Plan to discharge home with torsemide instead of lasix with K+ supplement. CHF precautions and education given.   2.  Chronic atrial fibrillation: stable on admission. Continued on Eliquis.   3.  History of nonsustained ventricular tachycardia: He is on acebutolol.  No evidence of ventricular tachycardia during this admission.  He does have apparent conduction of his atrial fibrillation on occasion. Overall, no complications during this admission.   4..  Morbid obesity: Encouraged to work on a weight loss program.  This will definitely help with all of his medical problems.  BarWynema Birchker was seen by Dr. NahAcie Fredricksond determined stable for discharge home. Follow up in the office has been arranged. Medications are listed below.    _____________  Discharge Vitals Blood pressure 123/62, pulse (!) 52, temperature (!) 97.5 F (36.4 C), temperature source Oral, resp. rate (!) 26, height 5' 9" (1.753 m), weight 123.2 kg, SpO2 96 %.  Filed Weights   10/31/18 2036 11/01/18 0657  Weight: 124.1 kg 123.2 kg    Labs & Radiologic Studies    CBC Recent Labs    10/31/18 1026  WBC 12.8*  HGB 12.0*  HCT 35.8*  MCV 93.5  PLT [id number] Metabolic Panel Recent Labs    10/31/18 1026 11/01/18 0001  NA 139 138  K 3.9 3.4*  CL 105 100  CO2 22 25  GLUCOSE 146* 100*  BUN 20 20  CREATININE 0.94 1.01  CALCIUM 9.1 8.9   Liver Function Tests No results for input(s): AST, ALT, ALKPHOS, BILITOT, PROT, ALBUMIN in the last 72 hours. No results for input(s): LIPASE, AMYLASE in the last 72 hours. Cardiac Enzymes No results for input(s): CKTOTAL, CKMB, CKMBINDEX, TROPONINI in the last 72 hours. BNP Invalid input(s): POCBNP D-Dimer No results for input(s): DDIMER in the last 72 hours. Hemoglobin A1C No results for input(s): HGBA1C in the last 72 hours. Fasting Lipid Panel No results for input(s): CHOL, HDL, LDLCALC, TRIG, CHOLHDL, LDLDIRECT in the last 72 hours. Thyroid Function Tests Recent Labs  10/31/18 1612  TSH 3.544   _____________  Dg Chest 2 View  Result Date: 10/31/2018 CLINICAL DATA:  Chest pain EXAM: CHEST - 2 VIEW COMPARISON:  08/01/17 FINDINGS: Chronic cardiomegaly. Vascular pedicle widening. Mild interstitial coarsening that is generalized. Large lung volumes. Spondylosis. IMPRESSION: Mild CHF. Electronically Signed   By: Monte Fantasia M.D.   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 EXAM: MRI HEAD WITHOUT AND WITH CONTRAST TECHNIQUE: Multiplanar, multiecho pulse sequences of the brain and surrounding structures were obtained without and with intravenous  contrast. CONTRAST:  10 mL Gadavist COMPARISON:  None. FINDINGS: Brain: Sellar and suprasellar mass lesion is stable in size measuring 14.5 x 14.5 x 12 mm. This extends into the suprasellar region. Pituitary stalk is midline. There is no invasion of the cavernous sinus. Although the lesion is mildly heterogeneous, it enhances symmetrically. No focal micro adenoma is evident. Conventional imaging the brain is stable. Mild atrophy and white matter changes are similar the prior study. The ventricles are proportionate to the degree of atrophy. No significant extraaxial fluid collection is present. Dilated perivascular spaces are present throughout the basal ganglia. Remote lacunar infarcts are present in the thalami bilaterally. White matter changes extend into the brainstem. The brainstem and cerebellum are otherwise within normal limits. Vascular: Flow is present in the major intracranial arteries. Skull and upper cervical spine: The craniocervical junction is normal. Upper cervical spine is within normal limits. Marrow signal is unremarkable. Sinuses/Orbits: The paranasal sinuses and mastoid air cells are clear. Bilateral lens replacements are noted. Globes and orbits are otherwise unremarkable. IMPRESSION: 1. 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 There is no significant growth or invasion of adjacent structures. 2. Stable atrophy and white matter change. This likely reflects the sequela of chronic microvascular ischemia. Electronically Signed   By: San Morelle M.D.   On: 10/11/2018 09:37   Xr Wrist Complete Right  Result Date: 10/28/2018 Spurring is noted over the radial head.  No acute fracture was noted.  No erosive changes were noted.  Disposition   Pt is being discharged home today in good condition.  Follow-up Plans & Appointments    Follow-up Information    Evans Lance, MD Follow up.   Specialty: Cardiology Why: Office will call you with  a follow up appt.  Contact information: [address] [address] [phone]          Discharge Instructions    Diet - low sodium heart healthy   Complete by: As directed    Discharge instructions   Complete by: As directed    For patients with congestive heart failure, we give them these special instructions:  1. Follow a low-salt diet and watch your fluid intake. In general, you should not be taking in more than 2 liters of fluid per day (no more than 8 glasses per day). Some patients are restricted to less than 1.5 liters of fluid per day (no more than 6 glasses per day). This includes sources of water in foods like soup, coffee, tea, milk, etc. 2. Weigh yourself on the same scale at same time of day and keep a log. 3. Call your doctor: (Anytime you feel any of the following symptoms)  - 3-4 pound weight gain in 1-2 days or 2 pounds overnight  - Shortness of breath, with or without  a dry hacking cough  - Swelling in the hands, feet or stomach  - If you have to sleep on extra pillows at night in order to breathe   IT IS IMPORTANT TO LET YOUR DOCTOR KNOW EARLY ON IF YOU ARE HAVING SYMPTOMS SO WE CAN HELP YOU!   Increase activity slowly   Complete by: As directed        Discharge Medications     Medication List    STOP taking these medications   furosemide 40 MG tablet Commonly known as: LASIX     TAKE these medications   Accu-Chek FastClix Lancets Misc USE ONCE DAILY   Accu-Chek Guide Control Liqd USE AS DIRECTED   Accu-Chek Guide w/Device Kit 1 kit by In Vitro route daily.   acebutolol 200 MG capsule Commonly known as: SECTRAL TAKE 1 CAPSULE BY MOUTH TWICE DAILY   Apremilast 30 MG Tabs Commonly known as: Otezla Take 1 tablet by mouth 2 (two) times daily. What changed: how much to take   cetirizine 10 MG tablet Commonly known as: ZYRTEC Take 10 mg by mouth at bedtime.   CITRACAL + D PO Take 1 tablet by mouth daily.    cyanocobalamin 1000 MCG/ML injection Commonly known as: (VITAMIN B-12) Inject 1 mL (1,000 mcg total) into the muscle every 30 (thirty) days.   Eliquis 5 MG Tabs tablet Generic drug: apixaban Take 1 tablet (5 mg total) by mouth 2 (two) times daily.   FLUoxetine 10 MG tablet Commonly known as: PROZAC Take 3 tablets (30 mg total) by mouth every morning.   fluticasone 50 MCG/ACT nasal spray Commonly known as: FLONASE Place 2 sprays into both nostrils daily as needed for rhinitis.   glipiZIDE 5 MG tablet Commonly known as: GLUCOTROL Take 1 tablet (5 mg total) by mouth daily before breakfast.   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   ibuprofen 200 MG tablet Commonly known as: ADVIL Take 800 mg by mouth every 8 (eight) hours as needed for headache or mild pain.   lisinopril 2.5 MG tablet Commonly known as: ZESTRIL TAKE 1 TABLET BY MOUTH EVERY EVENING   MELATONIN GUMMIES PO Take 2 tablets by mouth at bedtime. CHEW   metFORMIN 750 MG 24 hr tablet Commonly known as: GLUCOPHAGE-XR TAKE 1 TABLET BY MOUTH TWICE DAILY What changed: when to take this   potassium chloride SA 20 MEQ tablet Commonly known as: K-DUR Take 1 tablet (20 mEq total) by mouth daily.   pravastatin 20 MG tablet Commonly known as: PRAVACHOL TAKE 1 TABLET BY MOUTH EVERY EVENING   pregabalin 25 MG capsule Commonly known as: Lyrica Take up to 3 tabs a day What changed:   how much to take  how to take this  when to take this  additional instructions   PreserVision AREDS 2 Caps Take 1 capsule by mouth 2 (two) times daily.   rOPINIRole 0.5 MG tablet Commonly known as: REQUIP TAKE 2 TABLETS BY MOUTH AT BEDTIME   tamsulosin 0.4 MG Caps capsule Commonly known as: FLOMAX Take 0.4 mg at bedtime by mouth.   torsemide 20 MG tablet Commonly known as: DEMADEX Take 1 tablet (20 mg total) by mouth daily. Start  taking on: November 02, 2018   Vitamin D3 125 MCG (5000 UT) Tabs Take 5,000 Units every morning by mouth.       Outstanding Labs/Studies   BM[facility]  at [facility] appt.   Duration of Discharge Encounter   Greater than 30 minutes including physician time.  Signed, Reino Bellis NP-C 11/01/2018, 1:25 PM    Attending Note:   The patient was seen and examined.  Agree with assessment and plan as noted above.  Changes made to the above note as needed.  Patient seen and independently examined with Reino Bellis, NP .   We discussed all aspects of the encounter. I agree with the assessment and plan as stated above.  1.   Acute on chronic diastolic CHF.   Pt diuresed 3.5 liters and is feeling much better.    Will change the lasix to Torsemide 20 mg a day .  He weighs himself and can take extra if needed   2.  Atrial fib;  Chronic .  Continue eliquis   3.  Morbid obesity :   Encourage weight loss.    I have spent a total of 40 minutes with patient reviewing hospital  notes , telemetry, EKGs, labs and examining patient as well as establishing an assessment and plan that was discussed with the patient. > 50% of time was spent in direct patient care.    Thayer Headings, Brooke Bonito., MD, [facility] 11/01/2018, 4:00 PM [address]  [place] Pager [phone]

## 2018-11-01 NOTE — Plan of Care (Signed)
  Problem: Education: Goal: Knowledge of General Education information will improve Description: Including pain rating scale, medication(s)/side effects and non-pharmacologic comfort measures Outcome: Completed/Met   Problem: Health Behavior/Discharge Planning: Goal: Ability to manage health-related needs will improve 11/01/2018 1309 by Don Perking, RN Outcome: Completed/Met 11/01/2018 0956 by Don Perking, RN Outcome: Progressing   Problem: Clinical Measurements: Goal: Ability to maintain clinical measurements within normal limits will improve 11/01/2018 1309 by Don Perking, RN Outcome: Completed/Met 11/01/2018 0956 by Don Perking, RN Outcome: Progressing Goal: Will remain free from infection 11/01/2018 1309 by Don Perking, RN Outcome: Completed/Met 11/01/2018 0956 by Don Perking, RN Outcome: Progressing Goal: Diagnostic test results will improve 11/01/2018 1309 by Don Perking, RN Outcome: Completed/Met 11/01/2018 0956 by Don Perking, RN Outcome: Progressing Goal: Respiratory complications will improve 11/01/2018 1309 by Don Perking, RN Outcome: Completed/Met 11/01/2018 0956 by Don Perking, RN Outcome: Progressing Goal: Cardiovascular complication will be avoided 11/01/2018 1309 by Don Perking, RN Outcome: Completed/Met 11/01/2018 0956 by Don Perking, RN Outcome: Progressing   Problem: Activity: Goal: Risk for activity intolerance will decrease 11/01/2018 1309 by Don Perking, RN Outcome: Completed/Met 11/01/2018 0956 by Don Perking, RN Outcome: Progressing   Problem: Nutrition: Goal: Adequate nutrition will be maintained Outcome: Completed/Met   Problem: Coping: Goal: Level of anxiety will decrease 11/01/2018 1309 by Don Perking, RN Outcome: Completed/Met 11/01/2018 0956 by Don Perking, RN Outcome: Progressing   Problem: Elimination: Goal: Will not experience complications related  to bowel motility 11/01/2018 1309 by Don Perking, RN Outcome: Completed/Met 11/01/2018 0956 by Don Perking, RN Outcome: Progressing Goal: Will not experience complications related to urinary retention 11/01/2018 1309 by Don Perking, RN Outcome: Completed/Met 11/01/2018 0956 by Don Perking, RN Outcome: Progressing   Problem: Pain Managment: Goal: General experience of comfort will improve 11/01/2018 1309 by Don Perking, RN Outcome: Completed/Met 11/01/2018 0956 by Don Perking, RN Outcome: Progressing   Problem: Safety: Goal: Ability to remain free from injury will improve 11/01/2018 1309 by Don Perking, RN Outcome: Completed/Met 11/01/2018 0956 by Don Perking, RN Outcome: Progressing   Problem: Skin Integrity: Goal: Risk for impaired skin integrity will decrease 11/01/2018 1309 by Don Perking, RN Outcome: Completed/Met 11/01/2018 0956 by Don Perking, RN Outcome: Progressing   Problem: Education: Goal: Ability to demonstrate management of disease process will improve 11/01/2018 1309 by Don Perking, RN Outcome: Completed/Met 11/01/2018 0956 by Don Perking, RN Outcome: Progressing Goal: Ability to verbalize understanding of medication therapies will improve 11/01/2018 1309 by Don Perking, RN Outcome: Completed/Met 11/01/2018 0956 by Don Perking, RN Outcome: Progressing Goal: Individualized Educational Video(s) 11/01/2018 1309 by Don Perking, RN Outcome: Completed/Met 11/01/2018 0956 by Don Perking, RN Outcome: Progressing   Problem: Activity: Goal: Capacity to carry out activities will improve 11/01/2018 1309 by Don Perking, RN Outcome: Completed/Met 11/01/2018 0956 by Don Perking, RN Outcome: Progressing   Problem: Cardiac: Goal: Ability to achieve and maintain adequate cardiopulmonary perfusion will improve 11/01/2018 1309 by Don Perking, RN Outcome:  Completed/Met 11/01/2018 0956 by Don Perking, RN Outcome: Progressing

## 2018-11-01 NOTE — Progress Notes (Signed)
Discussed and explained discharge instructions, f/u appt and prescription sent electronically to pharmacy but pharmacy closed at 2pm and do not open till Monday morning. Therefore gave pt a torsemide and potassium tablet to take for tomorrow morning. Pt belongings and cellphone went home with pt via w/c with wife.

## 2018-11-01 NOTE — Care Management CC44 (Signed)
Condition Code 44 Documentation Completed  Patient Details  Name: ODEAN FESTER MRN: [id number] Date of Birth: [birth year]   Condition Code 44 given:  Yes Patient signature on Condition Code 44 notice:  Yes Documentation of 2 MD's agreement:  Yes Code 44 added to claim:  Yes    Claudie Leach, RN 11/01/2018, 2:25 PM

## 2018-11-01 NOTE — Progress Notes (Signed)
Progress Note  Patient Name: [name] Date of Encounter: 11/01/2018  Primary Cardiologist: Cristopher Peru, MD   Subjective   73 year old gentleman who was admitted last night with palpitations and shortness of breath.  I suspected he had acute on chronic diastolic congestive heart failure.  He has been diuresed.  He has diuresed a total of 3.3 L so far during this admission.  Echocardiogram was performed this morning.  Normal LV systolic function .   B atrial enlargement .   Inpatient Medications    Scheduled Meds: . acebutolol  200 mg Oral BID  . apixaban  5 mg Oral BID  . FLUoxetine  30 mg Oral q morning - 10a  . furosemide  40 mg Intravenous BID  . glipiZIDE  5 mg Oral QAC breakfast  . insulin aspart  0-15 Units Subcutaneous TID WC  . insulin aspart  0-5 Units Subcutaneous QHS  . lisinopril  2.5 mg Oral QPM  . loratadine  10 mg Oral Daily  . Melatonin  3 mg Oral QHS  . metFORMIN  750 mg Oral BID WC  . multivitamin  1 tablet Oral BID  . potassium chloride  20 mEq Oral BID  . pravastatin  20 mg Oral QPM  . pregabalin  25 mg Oral q morning - 10a  . pregabalin  50 mg Oral QHS  . rOPINIRole  1 mg Oral QHS  . sodium chloride flush  3 mL Intravenous Once  . sodium chloride flush  3 mL Intravenous Q12H  . tamsulosin  0.4 mg Oral QHS   Continuous Infusions: . sodium chloride     PRN Meds: sodium chloride, acetaminophen, ALPRAZolam, fluticasone, ondansetron (ZOFRAN) IV, sodium chloride flush, zolpidem   Vital Signs    Vitals:   11/01/18 0313 11/01/18 0657 11/01/18 0732 11/01/18 1119  BP: (!) 146/88  (!) 142/83 123/62  Pulse: 61  62 (!) 52  Resp: (!) 29  15 (!) 26  Temp: 98.2 F (36.8 C)  98.9 F (37.2 C) (!) 97.5 F (36.4 C)  TempSrc: Oral  Oral Oral  SpO2: 93%  93% 96%  Weight:  123.2 kg    Height:        Intake/Output Summary (Last 24 hours) at 11/01/2018 1222 Last data filed at 11/01/2018 1200 Gross per 24 hour  Intake 603 ml  Output 3950 ml  Net -3347  ml   Last 3 Weights 11/01/2018 10/31/2018 10/28/2018  Weight (lbs) 271 lb 9.7 oz 273 lb 9.5 oz 283 lb 6.4 oz  Weight (kg) 123.2 kg 124.1 kg 128.549 kg      Telemetry    Atrial fib - Personally Reviewed  ECG     atrial fib  - Personally Reviewed  Physical Exam    GEN: middle age male,  Morbidly obese  Neck: No JVD Cardiac:   Irreg. Irreg.  Respiratory: Clear to auscultation bilaterally. GI: Soft, nontender, non-distended  MS: No edema; No deformity. Neuro:  Nonfocal  Psych: Normal affect   Labs    High Sensitivity Troponin:   Recent Labs  Lab 10/31/18 1026 10/31/18 1224 11/01/18 0001  TROPONINIHS 21* 19* 24*      Cardiac EnzymesNo results for input(s): TROPONINI in the last 168 hours. No results for input(s): TROPIPOC in the last 168 hours.   Chemistry Recent Labs  Lab 10/31/18 1026 11/01/18 0001  NA 139 138  K 3.9 3.4*  CL 105 100  CO2 22 25  GLUCOSE 146* 100*  BUN 20  20  CREATININE 0.94 1.01  CALCIUM 9.1 8.9  GFRNONAA >60 >60  GFRAA >60 >60  ANIONGAP 12 13     Hematology Recent Labs  Lab 10/31/18 1026  WBC 12.8*  RBC 3.83*  HGB 12.0*  HCT 35.8*  MCV 93.5  MCH 31.3  MCHC 33.5  RDW 12.7  PLT 260    BNP Recent Labs  Lab 10/31/18 1026  BNP 450.9*     DDimer No results for input(s): DDIMER in the last 168 hours.   Radiology    Dg Chest 2 View  Result Date: 10/31/2018 CLINICAL DATA:  Chest pain EXAM: CHEST - 2 VIEW COMPARISON:  08/01/17 FINDINGS: Chronic cardiomegaly. Vascular pedicle widening. Mild interstitial coarsening that is generalized. Large lung volumes. Spondylosis. IMPRESSION: Mild CHF. Electronically Signed   By: Monte Fantasia M.D.   On: 10/31/2018 10:43    Cardiac Studies     Patient Profile     73 y.o. male with chronic diastolic CHF   Assessment & Plan    1.   Acute on chronic diastolic congestive heart failure: Mr. Hreha presents with symptoms of shortness of breath, PND, orthopnea.  He was having increased  palpitations.  He has chronic atrial fibrillation with aberrant conduction but the atrial fibrillation is basically asymptomatic.  We diuresed him approximately 3.5 L.  He is feeling much better.  We will send him home on torsemide instead of furosemide.  He weighs himself every day.  He will call us if he gains more than 3 pounds in a day or 5 pounds in a week.  2.  Chronic atrial fibrillation: This is a stable problem.  Continue current anticoagulation.  3.  History of nonsustained ventricular tachycardia: He is on acebutolol.  No evidence of ventricular tachycardia during this admission.  He does have apparent conduction of his atrial fibrillation on occasion.  4..  Morbid obesity: I encouraged him to work on a weight loss program.  This will definitely help with all of his medical problems.  He appears to be stable.  We will discharge him to home today.  He will follow-up with Dr. Lovena Le.  For questions or updates, please contact [place] Please consult [email or web address] for contact info under        Signed, Mertie Moores, MD  11/01/2018, 12:22 PM

## 2018-11-03 ENCOUNTER — Telehealth: Payer: Self-pay

## 2018-11-03 NOTE — Telephone Encounter (Signed)
Noted. Thanks.  If he has trouble getting set up with cardiology for follow up, then please let me know.

## 2018-11-03 NOTE — Telephone Encounter (Signed)
Spoke with patient-TCM completed separately. Patient states he is taking Gabapentin 1 tablet in the morning and 2 tablets in the evening. Patient did not notice swelling issue with increase of medication that he could tell. His pain and swelling is better now. He did have to go to [employer] to stay with his brother for about a week and they ate out a lot. But he has not intentionally increased salt intake, etc. That he knows of. Patient requests 90 day supply with updated directions for Gabapentin to be sent in.

## 2018-11-03 NOTE — Telephone Encounter (Signed)
Transition Care Management Follow-up Telephone Call   Date discharged? 11/01/2018   How have you been since you were released from the hospital? He is doing ok. Patient wanted to make sure Dr. [name] is aware his Lasix is changed to Torsemide and started on Pottassium.   Do you understand why you were in the hospital? yes   Do you understand the discharge instructions? yes   Where were you discharged to? home   Items Reviewed:  Medications reviewed: yes  Allergies reviewed: yes  Dietary changes reviewed: yes  Referrals reviewed:yes-follow up with cardiologist-this is not scheduled yet   Functional Questionnaire:   Activities of Daily Living (ADLs):   He states they are independent in the following: ambulation, feeding, bathing, dressing, toileting, hygiene. States they require assistance with the following: none   Any transportation issues/concerns?: no   Any patient concerns? Not at this time   Confirmed importance and date/time of follow-up visits scheduled-no.    Confirmed with patient if condition begins to worsen call PCP or go to  the ER.  Patient was given the office number and encouraged to call back with question or concerns.  : yes

## 2018-11-03 NOTE — Telephone Encounter (Signed)
For clarification, you mean lyrica/pregabalin, not gabapentin, correct?  If so, then I'll send.  Thanks.

## 2018-11-03 NOTE — Telephone Encounter (Signed)
Please check on patient.  See if he had inc in edema on med at any dose.  He had recent CHF exacerbation.  I need to know if he was off his diet (ie inc salt intake to explain the situation) or if the lyrica could have made his fluid retention worse.  If a person is going to have fluid retention on this med, it is usually benign peripheral fluid retention (ie puffy ankles) and usually only on the higher doses.  Let me know.  Thanks.

## 2018-11-03 NOTE — Telephone Encounter (Signed)
Sent.  Thanks for checking.  Okay to continue as is.

## 2018-11-03 NOTE — Telephone Encounter (Signed)
Yes, I meant Lyrica. I am sorry.

## 2018-11-11 ENCOUNTER — Other Ambulatory Visit: Payer: Self-pay

## 2018-11-11 ENCOUNTER — Ambulatory Visit (INDEPENDENT_AMBULATORY_CARE_PROVIDER_SITE_OTHER): Payer: Medicare Other

## 2018-11-11 DIAGNOSIS — E538 Deficiency of other specified B group vitamins: Secondary | ICD-10-CM

## 2018-11-11 MED ORDER — CYANOCOBALAMIN 1000 MCG/ML IJ SOLN
1000.0000 ug | Freq: Once | INTRAMUSCULAR | Status: AC
  Administered 2018-11-11: 1000 ug via INTRAMUSCULAR

## 2018-11-11 NOTE — Progress Notes (Signed)
Pt given monthly B12 in left deltoid. Tolerated well. States he has next month scheduled already.

## 2018-11-12 ENCOUNTER — Encounter: Payer: Self-pay | Admitting: Internal Medicine

## 2018-11-14 ENCOUNTER — Other Ambulatory Visit: Payer: Self-pay | Admitting: *Deleted

## 2018-11-14 MED ORDER — OTEZLA 30 MG PO TABS: 1.0000 | ORAL_TABLET | Freq: Two times a day (BID) | ORAL | 0 refills | Status: DC

## 2018-11-14 NOTE — Telephone Encounter (Signed)
Last Visit: 10/28/18 Next Visit: 02/09/19  Okay to refill per Dr. Estanislado Pandy

## 2018-12-02 DIAGNOSIS — R0600 Dyspnea, unspecified: Secondary | ICD-10-CM | POA: Diagnosis not present

## 2018-12-02 DIAGNOSIS — I480 Paroxysmal atrial fibrillation: Secondary | ICD-10-CM | POA: Diagnosis not present

## 2018-12-02 DIAGNOSIS — I5032 Chronic diastolic (congestive) heart failure: Secondary | ICD-10-CM | POA: Diagnosis not present

## 2018-12-05 ENCOUNTER — Encounter: Payer: Self-pay | Admitting: Family Medicine

## 2018-12-05 ENCOUNTER — Ambulatory Visit (INDEPENDENT_AMBULATORY_CARE_PROVIDER_SITE_OTHER): Payer: Medicare Other | Admitting: Family Medicine

## 2018-12-05 ENCOUNTER — Other Ambulatory Visit: Payer: Self-pay

## 2018-12-05 VITALS — BP 110/60 | HR 69 | Temp 98.0°F | Ht 69.0 in | Wt 286.0 lb

## 2018-12-05 DIAGNOSIS — E119 Type 2 diabetes mellitus without complications: Secondary | ICD-10-CM

## 2018-12-05 DIAGNOSIS — Z23 Encounter for immunization: Secondary | ICD-10-CM

## 2018-12-05 DIAGNOSIS — G4733 Obstructive sleep apnea (adult) (pediatric): Secondary | ICD-10-CM

## 2018-12-05 DIAGNOSIS — E1149 Type 2 diabetes mellitus with other diabetic neurological complication: Secondary | ICD-10-CM

## 2018-12-05 DIAGNOSIS — I10 Essential (primary) hypertension: Secondary | ICD-10-CM | POA: Diagnosis not present

## 2018-12-05 LAB — POCT GLYCOSYLATED HEMOGLOBIN (HGB A1C): Hemoglobin A1C: 6.4 % — AB (ref 4.0–5.6)

## 2018-12-05 NOTE — Progress Notes (Signed)
Occ lightheaded, lower BP noted.  He has AF but not CP.  He just had dose reduction in acebutolol.  He has cardiology f/u pending.  It is likely reasonable to give a few more days to see if lower dose affects lightheadedness.  Discussed with patient.  He agrees.  Diabetes:  Using medications without difficulties: yes Hypoglycemic episodes: rare, cautions d/w pt. Not in the last 2 months.  Only if prolonged fasting.  Hyperglycemic episodes: no Feet problems: feet are clearly hurting less "hardly any" with lyrica.  No edema on lyrica.  He can walk better with med.  Blood Sugars averaging: usually ~130. eye exam within last year: yes A1c 6.4.  Lab discussed with patient.  Prev edema was attributed to inc salt intake when eating out more when he had plumbing trouble and no functional kitchen at home.   OSA.  Still using nightly.  Compliant, 100% of nights. He can sleep w/o it.  He wakes up feeling better with use.  He benefits from use.  PMH and SH reviewed  Meds, vitals, and allergies reviewed.   ROS: Per HPI unless specifically indicated in ROS section   GEN: nad, alert and oriented HEENT: mucous membranes moist NECK: supple w/o LA CV: IRR not tachy PULM: ctab, no inc wob ABD: soft, +bs EXT: no edema SKIN: no acute rash  Diabetic foot exam: Normal inspection No skin breakdown No calluses  Normal DP pulses Normal sensation to light touch and monofilament Nails normal

## 2018-12-05 NOTE — Patient Instructions (Signed)
Don't change your meds for now.   Keep the 02/2019 appointment here.  I'll await the cardiology notes.  Take care.  Glad to see you.

## 2018-12-08 ENCOUNTER — Encounter: Payer: Self-pay | Admitting: Family Medicine

## 2018-12-08 NOTE — Assessment & Plan Note (Signed)
  OSA.  Still using nightly.  Compliant, 100% of nights. He can sleep w/o it.  He wakes up feeling better with use.  He benefits from use.

## 2018-12-08 NOTE — Assessment & Plan Note (Signed)
Occ lightheaded, lower BP noted.  He has AF but not CP.  He just had dose reduction in acebutolol.  He has cardiology f/u pending.  It is likely reasonable to give a few more days to see if lower dose affects lightheadedness.  Discussed with patient.  He agrees.  Prev edema was attributed to inc salt intake when eating out more when he had plumbing trouble and no functional kitchen at home.

## 2018-12-08 NOTE — Assessment & Plan Note (Signed)
A1c 6.4.  Lab discussed with patient. His feet are clearly hurting less "hardly any" with lyrica.  No edema on lyrica.  He can walk better with med.  Blood Sugars averaging: usually ~130. No change in meds at this point.  He agrees.  See after visit summary.

## 2018-12-09 ENCOUNTER — Other Ambulatory Visit: Payer: Self-pay | Admitting: Family Medicine

## 2018-12-16 ENCOUNTER — Ambulatory Visit (INDEPENDENT_AMBULATORY_CARE_PROVIDER_SITE_OTHER): Payer: Medicare Other

## 2018-12-16 DIAGNOSIS — E538 Deficiency of other specified B group vitamins: Secondary | ICD-10-CM | POA: Diagnosis not present

## 2018-12-16 MED ORDER — CYANOCOBALAMIN 1000 MCG/ML IJ SOLN
1000.0000 ug | Freq: Once | INTRAMUSCULAR | Status: AC
  Administered 2018-12-16: 1000 ug via INTRAMUSCULAR

## 2018-12-16 NOTE — Progress Notes (Signed)
Pt given monthly B 12 in Left Deltoid. Tolerated well.

## 2018-12-30 DIAGNOSIS — I472 Ventricular tachycardia: Secondary | ICD-10-CM | POA: Diagnosis not present

## 2018-12-30 DIAGNOSIS — R5383 Other fatigue: Secondary | ICD-10-CM | POA: Diagnosis not present

## 2018-12-30 DIAGNOSIS — I5032 Chronic diastolic (congestive) heart failure: Secondary | ICD-10-CM | POA: Diagnosis not present

## 2018-12-30 DIAGNOSIS — I2510 Atherosclerotic heart disease of native coronary artery without angina pectoris: Secondary | ICD-10-CM | POA: Diagnosis not present

## 2018-12-30 DIAGNOSIS — R0600 Dyspnea, unspecified: Secondary | ICD-10-CM | POA: Diagnosis not present

## 2019-01-15 DIAGNOSIS — I2510 Atherosclerotic heart disease of native coronary artery without angina pectoris: Secondary | ICD-10-CM | POA: Diagnosis not present

## 2019-01-15 DIAGNOSIS — I472 Ventricular tachycardia: Secondary | ICD-10-CM | POA: Diagnosis not present

## 2019-01-15 DIAGNOSIS — R5383 Other fatigue: Secondary | ICD-10-CM | POA: Diagnosis not present

## 2019-01-20 DIAGNOSIS — I472 Ventricular tachycardia: Secondary | ICD-10-CM | POA: Diagnosis not present

## 2019-01-21 ENCOUNTER — Ambulatory Visit: Payer: Medicare Other

## 2019-01-21 DIAGNOSIS — Z01810 Encounter for preprocedural cardiovascular examination: Secondary | ICD-10-CM | POA: Diagnosis not present

## 2019-01-21 DIAGNOSIS — G4733 Obstructive sleep apnea (adult) (pediatric): Secondary | ICD-10-CM | POA: Diagnosis not present

## 2019-01-21 DIAGNOSIS — I480 Paroxysmal atrial fibrillation: Secondary | ICD-10-CM | POA: Diagnosis not present

## 2019-01-21 DIAGNOSIS — Z6841 Body Mass Index (BMI) 40.0 and over, adult: Secondary | ICD-10-CM | POA: Diagnosis not present

## 2019-01-21 DIAGNOSIS — Z01818 Encounter for other preprocedural examination: Secondary | ICD-10-CM | POA: Diagnosis not present

## 2019-01-21 DIAGNOSIS — Z8679 Personal history of other diseases of the circulatory system: Secondary | ICD-10-CM | POA: Diagnosis not present

## 2019-01-21 DIAGNOSIS — I472 Ventricular tachycardia: Secondary | ICD-10-CM | POA: Diagnosis not present

## 2019-01-21 DIAGNOSIS — I493 Ventricular premature depolarization: Secondary | ICD-10-CM | POA: Diagnosis not present

## 2019-01-21 DIAGNOSIS — Z20828 Contact with and (suspected) exposure to other viral communicable diseases: Secondary | ICD-10-CM | POA: Diagnosis not present

## 2019-01-21 DIAGNOSIS — Z7901 Long term (current) use of anticoagulants: Secondary | ICD-10-CM | POA: Diagnosis not present

## 2019-01-21 DIAGNOSIS — I2510 Atherosclerotic heart disease of native coronary artery without angina pectoris: Secondary | ICD-10-CM | POA: Diagnosis not present

## 2019-01-21 DIAGNOSIS — I517 Cardiomegaly: Secondary | ICD-10-CM | POA: Diagnosis not present

## 2019-01-22 DIAGNOSIS — I509 Heart failure, unspecified: Secondary | ICD-10-CM | POA: Diagnosis not present

## 2019-01-22 DIAGNOSIS — Z6841 Body Mass Index (BMI) 40.0 and over, adult: Secondary | ICD-10-CM | POA: Diagnosis not present

## 2019-01-22 DIAGNOSIS — I10 Essential (primary) hypertension: Secondary | ICD-10-CM | POA: Diagnosis not present

## 2019-01-22 DIAGNOSIS — I493 Ventricular premature depolarization: Secondary | ICD-10-CM | POA: Diagnosis not present

## 2019-01-22 DIAGNOSIS — E1122 Type 2 diabetes mellitus with diabetic chronic kidney disease: Secondary | ICD-10-CM | POA: Diagnosis not present

## 2019-01-22 DIAGNOSIS — I472 Ventricular tachycardia: Secondary | ICD-10-CM | POA: Diagnosis not present

## 2019-01-22 DIAGNOSIS — Z87891 Personal history of nicotine dependence: Secondary | ICD-10-CM | POA: Diagnosis not present

## 2019-01-22 DIAGNOSIS — I480 Paroxysmal atrial fibrillation: Secondary | ICD-10-CM | POA: Diagnosis not present

## 2019-01-22 DIAGNOSIS — N183 Chronic kidney disease, stage 3 unspecified: Secondary | ICD-10-CM | POA: Diagnosis not present

## 2019-01-22 DIAGNOSIS — Z7984 Long term (current) use of oral hypoglycemic drugs: Secondary | ICD-10-CM | POA: Diagnosis not present

## 2019-01-22 DIAGNOSIS — I130 Hypertensive heart and chronic kidney disease with heart failure and stage 1 through stage 4 chronic kidney disease, or unspecified chronic kidney disease: Secondary | ICD-10-CM | POA: Diagnosis not present

## 2019-01-26 ENCOUNTER — Encounter: Payer: Self-pay | Admitting: Family Medicine
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
## 2019-01-29 ENCOUNTER — Ambulatory Visit: Payer: Medicare Other

## 2019-02-09 ENCOUNTER — Encounter: Payer: Self-pay | Admitting: Rheumatology

## 2019-02-09 ENCOUNTER — Telehealth: Payer: Self-pay | Admitting: Pharmacy Technician

## 2019-02-09 ENCOUNTER — Ambulatory Visit (INDEPENDENT_AMBULATORY_CARE_PROVIDER_SITE_OTHER): Payer: Medicare Other | Admitting: Rheumatology

## 2019-02-09 ENCOUNTER — Other Ambulatory Visit: Payer: Self-pay

## 2019-02-09 VITALS — BP 142/86 | HR 70 | Resp 14 | Ht 69.5 in | Wt 285.0 lb

## 2019-02-09 DIAGNOSIS — L4050 Arthropathic psoriasis, unspecified: Secondary | ICD-10-CM

## 2019-02-09 DIAGNOSIS — M19042 Primary osteoarthritis, left hand: Secondary | ICD-10-CM

## 2019-02-09 DIAGNOSIS — Z8679 Personal history of other diseases of the circulatory system: Secondary | ICD-10-CM

## 2019-02-09 DIAGNOSIS — Z96653 Presence of artificial knee joint, bilateral: Secondary | ICD-10-CM | POA: Diagnosis not present

## 2019-02-09 DIAGNOSIS — Z79899 Other long term (current) drug therapy: Secondary | ICD-10-CM

## 2019-02-09 DIAGNOSIS — I472 Ventricular tachycardia, unspecified: Secondary | ICD-10-CM

## 2019-02-09 DIAGNOSIS — G2581 Restless legs syndrome: Secondary | ICD-10-CM

## 2019-02-09 DIAGNOSIS — Z8669 Personal history of other diseases of the nervous system and sense organs: Secondary | ICD-10-CM | POA: Diagnosis not present

## 2019-02-09 DIAGNOSIS — M19071 Primary osteoarthritis, right ankle and foot: Secondary | ICD-10-CM

## 2019-02-09 DIAGNOSIS — L409 Psoriasis, unspecified: Secondary | ICD-10-CM | POA: Diagnosis not present

## 2019-02-09 DIAGNOSIS — M19041 Primary osteoarthritis, right hand: Secondary | ICD-10-CM

## 2019-02-09 DIAGNOSIS — Z862 Personal history of diseases of the blood and blood-forming organs and certain disorders involving the immune mechanism: Secondary | ICD-10-CM

## 2019-02-09 DIAGNOSIS — Z87448 Personal history of other diseases of urinary system: Secondary | ICD-10-CM

## 2019-02-09 DIAGNOSIS — Z8639 Personal history of other endocrine, nutritional and metabolic disease: Secondary | ICD-10-CM | POA: Diagnosis not present

## 2019-02-09 DIAGNOSIS — M19072 Primary osteoarthritis, left ankle and foot: Secondary | ICD-10-CM

## 2019-02-09 NOTE — Telephone Encounter (Signed)
Patient had visit at office today, was provided [facility] renewal application. Will bring to office once completed.  10:24 AM Beatriz Chancellor, CPhT

## 2019-02-12 ENCOUNTER — Other Ambulatory Visit: Payer: Self-pay

## 2019-02-12 ENCOUNTER — Ambulatory Visit (INDEPENDENT_AMBULATORY_CARE_PROVIDER_SITE_OTHER): Payer: Medicare Other | Admitting: *Deleted

## 2019-02-12 DIAGNOSIS — E538 Deficiency of other specified B group vitamins: Secondary | ICD-10-CM

## 2019-02-12 MED ORDER — CYANOCOBALAMIN 1000 MCG/ML IJ SOLN
1000.0000 ug | Freq: Once | INTRAMUSCULAR | Status: AC
  Administered 2019-02-12: 1000 ug via INTRAMUSCULAR

## 2019-02-12 NOTE — Progress Notes (Signed)
Per orders of Dr. [name], injection of 1000 mcg/mL given by [facility]. Patient tolerated injection well.

## 2019-02-19 ENCOUNTER — Encounter: Payer: Self-pay | Admitting: Family Medicine

## 2019-02-19 ENCOUNTER — Ambulatory Visit: Payer: Medicare Other | Admitting: Psychiatry

## 2019-02-23 ENCOUNTER — Other Ambulatory Visit: Payer: Medicare Other

## 2019-02-23 ENCOUNTER — Other Ambulatory Visit: Payer: Self-pay | Admitting: Family Medicine

## 2019-02-23 ENCOUNTER — Ambulatory Visit: Payer: Medicare Other

## 2019-02-23 DIAGNOSIS — E538 Deficiency of other specified B group vitamins: Secondary | ICD-10-CM

## 2019-02-23 DIAGNOSIS — E119 Type 2 diabetes mellitus without complications: Secondary | ICD-10-CM

## 2019-02-23 DIAGNOSIS — E559 Vitamin D deficiency, unspecified: Secondary | ICD-10-CM

## 2019-02-25 ENCOUNTER — Other Ambulatory Visit: Payer: Self-pay

## 2019-03-02 ENCOUNTER — Encounter: Payer: Medicare Other | Admitting: Family Medicine

## 2019-03-02 ENCOUNTER — Ambulatory Visit: Payer: Medicare Other

## 2019-03-04 ENCOUNTER — Encounter: Payer: Self-pay | Admitting: Psychiatry

## 2019-03-04 ENCOUNTER — Ambulatory Visit (INDEPENDENT_AMBULATORY_CARE_PROVIDER_SITE_OTHER): Payer: Medicare Other | Admitting: Psychiatry

## 2019-03-04 ENCOUNTER — Other Ambulatory Visit: Payer: Self-pay

## 2019-03-04 DIAGNOSIS — F4310 Post-traumatic stress disorder, unspecified: Secondary | ICD-10-CM | POA: Diagnosis not present

## 2019-03-04 DIAGNOSIS — F330 Major depressive disorder, recurrent, mild: Secondary | ICD-10-CM | POA: Diagnosis not present

## 2019-03-04 MED ORDER — FLUOXETINE HCL 10 MG PO TABS: 30.0000 mg | ORAL_TABLET | Freq: Every morning | ORAL | 3 refills | Status: DC

## 2019-03-04 NOTE — Progress Notes (Signed)
[name] [id number] 12-26-1945 73 y.o.   Virtual Visit via Telephone Note  I connected with pt by telephone and verified that I am speaking with the correct person using two identifiers.   I discussed the limitations, risks, security and privacy concerns of performing an evaluation and management service by telephone and the availability of in person appointments. I also discussed with the patient that there may be a patient responsible charge related to this service. The patient expressed understanding and agreed to proceed.  I discussed the assessment and treatment plan with the patient. The patient was provided an opportunity to ask questions and all were answered. The patient agreed with the plan and demonstrated an understanding of the instructions.   The patient was advised to call back or seek an in-person evaluation if the symptoms worsen or if the condition fails to improve as anticipated.  I provided 10 minutes of non-face-to-face time during this encounter. The call started at 12:00 and ended at 1210. The patient was located at home and the provider was located office.   Subjective:   Patient ID:  [name] is a 73 y.o. (DOB 1945/11/23) male.  Chief Complaint:  Chief Complaint  Patient presents with  . Follow-up    Medication Management  . Depression    Medication Management  . Other    PTSD    Depression        Associated symptoms include no decreased concentration and no suicidal ideas.  Arvil Utz [name] presents to the office today for follow-up of depression and anxiety.  He has been under my care for many years, 86.  On Prozac all those years.  We had increased the fluoxetine in June over some worsening depression DT health matters.  The increase in the fluoxetine has helped.   Last seen Feb without med changes.  Served custody papers with F trying to get GD.  Left to live with her F in July.  Depressing time for me but handled it.  Had a supportive  friends which helped.  Read [name]'s book which helped.  [place] with it.  She's 72 yo.  Still loves them and sees them.  Patient reports stable mood and denies depressed or irritable moods.  Patient denies any recent difficulty with anxiety.  Patient denies difficulty with sleep initiation or maintenance. Denies appetite disturbance.  Patient reports that energy and motivation have been good.  Patient denies any difficulty with concentration.  Patient denies any suicidal ideation.  W tested positive Covid but no sx.  He's negative.    Feels so much better after ablation for VT.  Energy is better. Energy is night and day better.  Can go for walks now and other's notice.  .Past Psychiatric Medication Trials: Zoloft, Trazodone, fluoxetine 30  Review of Systems:  Review of Systems  Cardiovascular: Negative for palpitations.  Neurological: Positive for numbness. Negative for tremors and weakness.  Psychiatric/Behavioral: Positive for depression. Negative for agitation, behavioral problems, confusion, decreased concentration, dysphoric mood, hallucinations, self-injury, sleep disturbance and suicidal ideas. The patient is not nervous/anxious and is not hyperactive.     Medications: I have reviewed the patient's current medications.  Current Outpatient Medications  Medication Sig Dispense Refill  . ACCU-CHEK FASTCLIX LANCETS MISC USE ONCE DAILY 102 each 3  . 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 100 strip 3  . Apremilast (OTEZLA) 30 MG TABS Take 1 tablet by mouth 2 (two) times daily.  180 tablet 0  . Blood Glucose Calibration (ACCU-CHEK GUIDE CONTROL) LIQD USE AS DIRECTED 1 each 0  . Calcium Citrate-Vitamin D (CITRACAL + D PO) Take 1 tablet by mouth daily.     . cetirizine (ZYRTEC) 10 MG tablet Take 10 mg by mouth at bedtime.    . Cholecalciferol (VITAMIN D3) 5000 UNITS TABS Take 5,000 Units every morning by mouth.     . cyanocobalamin  (,VITAMIN B-12,) 1000 MCG/ML injection Inject 1 mL (1,000 mcg total) into the muscle every 30 (thirty) days.    [name] ELIQUIS 5 MG TABS tablet Take 1 tablet (5 mg total) by mouth 2 (two) times daily. 180 tablet 3  . FLUoxetine (PROZAC) 10 MG tablet Take 3 tablets (30 mg total) by mouth every morning. 270 tablet 3  . fluticasone (FLONASE) 50 MCG/ACT nasal spray Place 2 sprays into both nostrils daily as needed for rhinitis.     [name] glipiZIDE (GLUCOTROL) 5 MG tablet Take 1 tablet (5 mg total) by mouth daily before breakfast. 90 tablet 3  . ibuprofen (ADVIL) 200 MG tablet Take 800 mg by mouth every 8 (eight) hours as needed for headache or mild pain.    [name] lisinopril (PRINIVIL,ZESTRIL) 2.5 MG tablet TAKE 1 TABLET BY MOUTH EVERY EVENING (Patient taking differently: Take 2.5 mg by mouth every evening. ) 90 tablet 2  . MELATONIN GUMMIES PO Take 2 tablets by mouth at bedtime. CHEW    . metFORMIN (GLUCOPHAGE-XR) 750 MG 24 hr tablet TAKE 1 TABLET BY MOUTH TWICE DAILY 180 tablet 3  . Multiple Vitamins-Minerals (PRESERVISION AREDS 2) CAPS Take 1 capsule by mouth 2 (two) times daily.     . pravastatin (PRAVACHOL) 20 MG tablet TAKE 1 TABLET BY MOUTH EVERY EVENING (Patient taking differently: Take 20 mg by mouth every evening. ) 90 tablet 2  . pregabalin (LYRICA) 25 MG capsule Takes 1 capsule in the morning and 2 capsules in the evening 270 capsule 1  . rOPINIRole (REQUIP) 0.5 MG tablet TAKE 2 TABLETS BY MOUTH AT BEDTIME (Patient taking differently: Take 1 mg by mouth at bedtime. ) 180 tablet 1  . tamsulosin (FLOMAX) 0.4 MG CAPS capsule Take 0.4 mg at bedtime by mouth.     . torsemide (DEMADEX) 20 MG tablet Take 1 tablet (20 mg total) by mouth daily. 30 tablet 2  . Blood Glucose Monitoring Suppl (ACCU-CHEK GUIDE) w/Device KIT 1 kit by In Vitro route daily. 1 kit 0   No current facility-administered medications for this visit.     Medication Side Effects: None  Allergies:  Allergies  Allergen Reactions  . Cheese  Anaphylaxis    Bacteria in aged cheeses cause Anaphylactic reaction Patient can tolerate cheese that is not aged, such as ricotta, cream cheese and cottage cheese  . Verapamil Shortness Of Breath and Other (See Comments)    CP, irregular/slow HR, dizziness, heartburn, drowsiness, weakness  . Zithromax [Azithromycin Dihydrate] Swelling    Swelling (arms/legs/scrotum)  . Oxycodone Itching and Other (See Comments)    Sweats and itching- tolerates hydrocodone  . Acyclovir And Related Other (See Comments)    Told by MD  . Amiodarone Other (See Comments)    Acute lung toxicity  . Latex Itching  . Adhesive [Tape] Itching and Rash  . Amlodipine Other (See Comments)    Muscle pain    Past Medical History:  Diagnosis Date  . Allergic rhinitis   . Anticoagulant long-term use    eliquis  . Anxiety   .  Arthritis    WRISTS, KNEES, ANKLES  . CAD (coronary artery disease) CARDIOLOGIST-  DR GREGG TAYLOR   Nonobstructive CAD by cath 2006;  HEART CATH AGAIN ON 06/08/13 AFTER CHEST DISCOMFORT / ADMISSION TO [facility] - "MILD NON-OBSTRUCTIVE CAD, NORMAL LV SYSTOLIC FUNCTION"  . Depression   . Diastolic CHF [facility]) dx 07/2776--- cardiologist-  dr gregg taylor   EF 50-55%  per echo 05/2016  . GERD (gastroesophageal reflux disease)   . History of kidney stones   . HTN (hypertension)   . Hyperlipidemia   . OSA treated with BiPAP followed dr Elsworth Soho (previously dr clance)   per study 02-04-2012 very severe osa AHI 90/hr--  currently uses Bi-Pap every night per pt   . Paroxysmal VT [facility]) cardiologist--  dr Carleene Overlie taylor   RVOT VT diagnosed in 2006 by holter monitor;  VT from LV noted 4/13 - amiodarone started  . Persistent atrial fibrillation [facility]) cardiologist-  dr gregg taylor   dx [phone]--  s/p  DCCV's 2013 & 2014 --  currently taking eliquis daily  . PONV (postoperative nausea and vomiting)   . Psoriasis   . Psoriatic arthritis ([place])    rheumotologist-  dr s. Estanislado Pandy  . PTSD (post-traumatic stress disorder)    . Restless leg syndrome   . Stroke ([place])   . Type 2 diabetes mellitus (H[facility])     Family History  Problem Relation Age of Onset  . Sudden death Father 75  . Heart disease Father        MI at 57  . Heart disease Brother        stents and PPM  . Cancer Mother        benign tumor, died from surgery complications  . Other Mother        pituitary adenoma  . Heart disease Brother   . Cancer Brother        multiple myeloma  . Sudden death Paternal Grandmother 21  . Sudden death Paternal Uncle 58  . Heart disease Paternal Uncle   . Heart disease Maternal Uncle   . COPD Paternal Uncle   . Colon cancer Neg Hx   . Prostate cancer Neg Hx     Social History   Socioeconomic History  . Marital status: Married    Spouse name: Not on file  . Number of children: 1  . Years of education: Not on file  . Highest education level: Not on file  Occupational History  . Occupation: retired    Comment: Cytogeneticist  . Financial resource strain: Not hard at all  . Food insecurity    Worry: Never true    Inability: Never true  . Transportation needs    Medical: No    Non-medical: No  Tobacco Use  . Smoking status: Former Smoker    Packs/day: 3.00    Years: 2.00    Pack years: 6.00    Types: Cigarettes    Quit date: 08/21/1980    Years since quitting: 38.5  . Smokeless tobacco: Never Used  Substance and Sexual Activity  . Alcohol use: Yes    Alcohol/week: 0.0 standard drinks    Comment: occasional  . Drug use: No  . Sexual activity: Not Currently  Lifestyle  . Physical activity    Days per week: Patient refused    Minutes per session: Patient refused  . Stress: Not on file  Relationships  . Social connections    Talks on phone: Not on  file    Gets together: Not on file    Attends religious service: Not on file    Active member of club or organization: Not on file    Attends meetings of clubs or organizations: Not on file    Relationship status: Not on  file  . Intimate partner violence    Fear of current or ex partner: No    Emotionally abused: No    Physically abused: No    Forced sexual activity: No  Other Topics Concern  . 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 Previously lived in [place] shortly. Previously was a [employer]. He worked mostly with non-profit groups. Does have a cat currently. Remote bird exposure. No hot tub or mold exposure. Remote travel to [place], [place], [place], & [place].     Past Medical History, Surgical history, Social history, and Family history were reviewed and updated as appropriate.   Please see review of systems for further details on the patient's review from today.   Objective:   Physical Exam:  There were no vitals taken for this visit.  Physical Exam Neurological:     Mental Status: He is alert and oriented to person, place, and time.     Cranial Nerves: No dysarthria.  Psychiatric:        Attention and Perception: Attention normal.        Mood and Affect: Mood normal.        Speech: Speech normal.        Behavior: Behavior is cooperative.        Thought Content: Thought content normal. Thought content is not paranoid or delusional. Thought content does not include homicidal or suicidal ideation. Thought content does not include homicidal or suicidal plan.        Cognition and Memory: Cognition and memory normal.        Judgment: Judgment normal.     Lab Review:     Component Value Date/Time   NA 138 11/01/2018 0001   NA 141 08/17/2016 0825   NA 139 09/03/2014 1039   K 3.4 (L) 11/01/2018 0001   K 4.3 09/03/2014 1039   CL 100 11/01/2018 0001   CO2 25 11/01/2018 0001   CO2 23 09/03/2014 1039   GLUCOSE 100 (H) 11/01/2018 0001   GLUCOSE 156 (H) 09/03/2014 1039    BUN 20 11/01/2018 0001   BUN 17 08/17/2016 0825   BUN 23.0 09/03/2014 1039   CREATININE 1.01 11/01/2018 0001   CREATININE 1.0 09/03/2014 1039   CALCIUM 8.9 11/01/2018 0001   CALCIUM 9.3 09/03/2014 1039   PROT 7.3 01/27/2018 1001   PROT 7.5 09/03/2014 1039   ALBUMIN 4.0 01/27/2018 1001   ALBUMIN 3.5 09/03/2014 1039   AST 12 01/27/2018 1001   AST 15 09/03/2014 1039   ALT 12 01/27/2018 1001   ALT 14 09/03/2014 1039   ALKPHOS 85 01/27/2018 1001   ALKPHOS 125 09/03/2014 1039   BILITOT 0.8 01/27/2018 1001   BILITOT 1.10 09/03/2014 1039   GFRNONAA >60 11/01/2018 0001   GFRAA >60 11/01/2018 0001       Component Value Date/Time   WBC 12.8 (H) 10/31/2018 1026   RBC 3.83 (L) 10/31/2018 1026   HGB 12.0 (L) 10/31/2018  1026   HGB 12.7 (L) 08/17/2016 0825   HGB 12.3 (L) 09/03/2014 1037   HCT 35.8 (L) 10/31/2018 1026   HCT 37.5 08/17/2016 0825   HCT 36.1 (L) 09/03/2014 1037   PLT 260 10/31/2018 1026   PLT 229 08/17/2016 0825   MCV 93.5 10/31/2018 1026   MCV 91 08/17/2016 0825   MCV 88.6 09/03/2014 1037   MCH 31.3 10/31/2018 1026   MCHC 33.5 10/31/2018 1026   RDW 12.7 10/31/2018 1026   RDW 13.3 08/17/2016 0825   RDW 13.9 09/03/2014 1037   LYMPHSABS 1.5 01/27/2018 1001   LYMPHSABS 1.7 08/17/2016 0825   LYMPHSABS 1.2 09/03/2014 1037   MONOABS 0.5 01/27/2018 1001   MONOABS 0.4 09/03/2014 1037   EOSABS 0.3 01/27/2018 1001   EOSABS 0.3 08/17/2016 0825   BASOSABS 0.1 01/27/2018 1001   BASOSABS 0.0 08/17/2016 0825   BASOSABS 0.1 09/03/2014 1037    No results found for: POCLITH, LITHIUM   No results found for: PHENYTOIN, PHENOBARB, VALPROATE, CBMZ   .res Assessment: Plan:    Mild recurrent major depression ([place]) - Plan: FLUoxetine (PROZAC) 10 MG tablet  PTSD (post-traumatic stress disorder) - Plan: FLUoxetine (PROZAC) 10 MG tablet   Supportive therapy  Good response to fluoxetine 30 over 20 mg daily.  Irritability has been under control better at this dosage.   No clear  indication for change.  Call if there is a change in sx.  No med changes indicated.  FU 9 mos  Lynder Parents, MD, DFAPA   Please see After Visit Summary for patient specific instructions.  Future Appointments  Date Time Provider [place]  03/16/2019 10:45 AM [facility] LAB [facility]  03/16/2019  2:00 PM [facility] NURSE HEALTH ADVISOR [facility]  03/23/2019  2:00 PM [name], MD [facility]  08/10/2019 10:15 AM Bo Merino, MD [facility] None    No orders of the defined types were placed in this encounter.     -------------------------------

## 2019-03-05 ENCOUNTER — Telehealth: Payer: Self-pay

## 2019-03-05 MED ORDER — OTEZLA 30 MG PO TABS: 1.0000 | ORAL_TABLET | Freq: Two times a day (BID) | ORAL | 0 refills | Status: DC

## 2019-03-05 NOTE — Telephone Encounter (Signed)
Refill request received via fax from [facility] for [facility].   Last Visit: 02/09/2019 Next Visit: 08/10/2019  Okay to refill per Dr. Estanislado Pandy.

## 2019-03-16 ENCOUNTER — Other Ambulatory Visit: Payer: Self-pay

## 2019-03-16 ENCOUNTER — Ambulatory Visit (INDEPENDENT_AMBULATORY_CARE_PROVIDER_SITE_OTHER): Payer: Medicare Other

## 2019-03-16 ENCOUNTER — Other Ambulatory Visit (INDEPENDENT_AMBULATORY_CARE_PROVIDER_SITE_OTHER): Payer: Medicare Other

## 2019-03-16 DIAGNOSIS — Z0000 Encounter for general adult medical examination without abnormal findings: Secondary | ICD-10-CM | POA: Diagnosis not present

## 2019-03-16 DIAGNOSIS — E119 Type 2 diabetes mellitus without complications: Secondary | ICD-10-CM | POA: Diagnosis not present

## 2019-03-16 DIAGNOSIS — E538 Deficiency of other specified B group vitamins: Secondary | ICD-10-CM

## 2019-03-16 DIAGNOSIS — E559 Vitamin D deficiency, unspecified: Secondary | ICD-10-CM | POA: Diagnosis not present

## 2019-03-16 LAB — COMPREHENSIVE METABOLIC PANEL
ALT: 13 U/L (ref 0–53)
AST: 12 U/L (ref 0–37)
Albumin: 4.1 g/dL (ref 3.5–5.2)
Alkaline Phosphatase: 109 U/L (ref 39–117)
BUN: 32 mg/dL — ABNORMAL HIGH (ref 6–23)
CO2: 24 mEq/L (ref 19–32)
Calcium: 9.6 mg/dL (ref 8.4–10.5)
Chloride: 107 mEq/L (ref 96–112)
Creatinine, Ser: 1.39 mg/dL (ref 0.40–1.50)
GFR: 50.14 mL/min — ABNORMAL LOW (ref >60.00)
Glucose, Bld: 161 mg/dL — ABNORMAL HIGH (ref 70–99)
Potassium: 4.1 mEq/L (ref 3.5–5.1)
Sodium: 141 mEq/L (ref 135–145)
Total Bilirubin: 0.6 mg/dL (ref 0.2–1.2)
Total Protein: 7 g/dL (ref 6.0–8.3)

## 2019-03-16 LAB — CBC WITH DIFFERENTIAL/PLATELET
Basophils Absolute: 0.1 10*3/uL (ref 0.0–0.1)
Basophils Relative: 0.9 % (ref 0.0–3.0)
Eosinophils Absolute: 0.4 10*3/uL (ref 0.0–0.7)
Eosinophils Relative: 5.2 % — ABNORMAL HIGH (ref 0.0–5.0)
HCT: 35.2 % — ABNORMAL LOW (ref 39.0–52.0)
Hemoglobin: 12 g/dL — ABNORMAL LOW (ref 13.0–17.0)
Lymphocytes Relative: 17.6 % (ref 12.0–46.0)
Lymphs Abs: 1.3 10*3/uL (ref 0.7–4.0)
MCHC: 34 g/dL (ref 30.0–36.0)
MCV: 94.4 fl (ref 78.0–100.0)
Monocytes Absolute: 0.4 10*3/uL (ref 0.1–1.0)
Monocytes Relative: 5.6 % (ref 3.0–12.0)
Neutro Abs: 5.4 10*3/uL (ref 1.4–7.7)
Neutrophils Relative %: 70.7 % (ref 43.0–77.0)
Platelets: 204 10*3/uL (ref 150.0–400.0)
RBC: 3.73 Mil/uL — ABNORMAL LOW (ref 4.22–5.81)
RDW: 13.3 % (ref 11.5–15.5)
WBC: 7.7 10*3/uL (ref 4.0–10.5)

## 2019-03-16 LAB — LIPID PANEL
Cholesterol: 167 mg/dL (ref 0–200)
HDL: 31 mg/dL — ABNORMAL LOW (ref >39.00)
NonHDL: 135.58
Total CHOL/HDL Ratio: 5
Triglycerides: 214 mg/dL — ABNORMAL HIGH (ref 0.0–149.0)
VLDL: 42.8 mg/dL — ABNORMAL HIGH (ref 0.0–40.0)

## 2019-03-16 LAB — HEMOGLOBIN A1C: Hgb A1c MFr Bld: 6 % (ref 4.6–6.5)

## 2019-03-16 LAB — LDL CHOLESTEROL, DIRECT: Direct LDL: 98 mg/dL

## 2019-03-16 LAB — TSH: TSH: 3.03 u[IU]/mL (ref 0.35–4.50)

## 2019-03-16 LAB — VITAMIN D 25 HYDROXY (VIT D DEFICIENCY, FRACTURES): VITD: 50.53 ng/mL (ref 30.00–100.00)

## 2019-03-16 LAB — VITAMIN B12: Vitamin B-12: 317 pg/mL (ref 211–911)
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
## 2019-03-16 NOTE — Patient Instructions (Signed)
[name] , Thank you for taking time to come for your Medicare Wellness Visit. I appreciate your ongoing commitment to your health goals. Please review the following plan we discussed and let me know if I can assist you in the future.   Screening recommendations/referrals: Colonoscopy: Up to date, completed Cologuard 04/29/2018 Recommended yearly ophthalmology/optometry visit for glaucoma screening and checkup Recommended yearly dental visit for hygiene and checkup  Vaccinations: Influenza vaccine: Up to date, completed 12/05/2018 Pneumococcal vaccine: Completed series Tdap vaccine: decline Shingles vaccine: will check with insurance    Advanced directives: Please bring a copy of your POA (Power of Attorney) and/or Living Will to your next appointment.   Conditions/risks identified: diabetes, hypertension, hyperlipidemia  Next appointment: 03/23/2019 @ 2 pm   Preventive Care 65 Years and Older, Male Preventive care refers to lifestyle choices and visits with your health care provider that can promote health and wellness. What does preventive care include?  A yearly physical exam. This is also called an annual well check.  Dental exams once or twice a year.  Routine eye exams. Ask your health care provider how often you should have your eyes checked.  Personal lifestyle choices, including:  Daily care of your teeth and gums.  Regular physical activity.  Eating a healthy diet.  Avoiding tobacco and drug use.  Limiting alcohol use.  Practicing safe sex.  Taking low doses of aspirin every day.  Taking vitamin and mineral supplements as recommended by your health care provider. What happens during an annual well check? The services and screenings done by your health care provider during your annual well check will depend on your age, overall health, lifestyle risk factors, and family history of disease. Counseling  Your health care provider may ask you questions about  your:  Alcohol use.  Tobacco use.  Drug use.  Emotional well-being.  Home and relationship well-being.  Sexual activity.  Eating habits.  History of falls.  Memory and ability to understand (cognition).  Work and work Statistician. Screening  You may have the following tests or measurements:  Height, weight, and BMI.  Blood pressure.  Lipid and cholesterol levels. These may be checked every 5 years, or more frequently if you are over 71 years old.  Skin check.  Lung cancer screening. You may have this screening every year starting at age 28 if you have a 30-pack-year history of smoking and currently smoke or have quit within the past 15 years.  Fecal occult blood test (FOBT) of the stool. You may have this test every year starting at age 15.  Flexible sigmoidoscopy or colonoscopy. You may have a sigmoidoscopy every 5 years or a colonoscopy every 10 years starting at age 42.  Prostate cancer screening. Recommendations will vary depending on your family history and other risks.  Hepatitis C blood test.  Hepatitis B blood test.  Sexually transmitted disease (STD) testing.  Diabetes screening. This is done by checking your blood sugar (glucose) after you have not eaten for a while (fasting). You may have this done every 1-3 years.  Abdominal aortic aneurysm (AAA) screening. You may need this if you are a current or former smoker.  Osteoporosis. You may be screened starting at age 10 if you are at high risk. Talk with your health care provider about your test results, treatment options, and if necessary, the need for more tests. Vaccines  Your health care provider may recommend certain vaccines, such as:  Influenza vaccine. This is recommended every year.  Tetanus, diphtheria, and acellular pertussis (Tdap, Td) vaccine. You may need a Td booster every 10 years.  Zoster vaccine. You may need this after age 3.  Pneumococcal 13-valent conjugate (PCV13) vaccine.  One dose is recommended after age 82.  Pneumococcal polysaccharide (PPSV23) vaccine. One dose is recommended after age 60. Talk to your health care provider about which screenings and vaccines you need and how often you need them. This information is not intended to replace advice given to you by your health care provider. Make sure you discuss any questions you have with your health care provider. Document Released: 04/15/2015 Document Revised: 12/07/2015 Document Reviewed: 01/18/2015 Elsevier Interactive Patient Education  2017 [place] Prevention in the Home Falls can cause injuries. They can happen to people of all ages. There are many things you can do to make your home safe and to help prevent falls. What can I do on the outside of my home?  Regularly fix the edges of walkways and driveways and fix any cracks.  Remove anything that might make you trip as you walk through a door, such as a raised step or threshold.  Trim any bushes or trees on the path to your home.  Use bright outdoor lighting.  Clear any walking paths of anything that might make someone trip, such as rocks or tools.  Regularly check to see if handrails are loose or broken. Make sure that both sides of any steps have handrails.  Any raised decks and porches should have guardrails on the edges.  Have any leaves, snow, or ice cleared regularly.  Use sand or salt on walking paths during winter.  Clean up any spills in your garage right away. This includes oil or grease spills. What can I do in the bathroom?  Use night lights.  Install grab bars by the toilet and in the tub and shower. Do not use towel bars as grab bars.  Use non-skid mats or decals in the tub or shower.  If you need to sit down in the shower, use a plastic, non-slip stool.  Keep the floor dry. Clean up any water that spills on the floor as soon as it happens.  Remove soap buildup in the tub or shower regularly.  Attach bath  mats securely with double-sided non-slip rug tape.  Do not have throw rugs and other things on the floor that can make you trip. What can I do in the bedroom?  Use night lights.  Make sure that you have a light by your bed that is easy to reach.  Do not use any sheets or blankets that are too big for your bed. They should not hang down onto the floor.  Have a firm chair that has side arms. You can use this for support while you get dressed.  Do not have throw rugs and other things on the floor that can make you trip. What can I do in the kitchen?  Clean up any spills right away.  Avoid walking on wet floors.  Keep items that you use a lot in easy-to-reach places.  If you need to reach something above you, use a strong step stool that has a grab bar.  Keep electrical cords out of the way.  Do not use floor polish or wax that makes floors slippery. If you must use wax, use non-skid floor wax.  Do not have throw rugs and other things on the floor that can make you trip. What can I do  with my stairs?  Do not leave any items on the stairs.  Make sure that there are handrails on both sides of the stairs and use them. Fix handrails that are broken or loose. Make sure that handrails are as long as the stairways.  Check any carpeting to make sure that it is firmly attached to the stairs. Fix any carpet that is loose or worn.  Avoid having throw rugs at the top or bottom of the stairs. If you do have throw rugs, attach them to the floor with carpet tape.  Make sure that you have a light switch at the top of the stairs and the bottom of the stairs. If you do not have them, ask someone to add them for you. What else can I do to help prevent falls?  Wear shoes that:  Do not have high heels.  Have rubber bottoms.  Are comfortable and fit you well.  Are closed at the toe. Do not wear sandals.  If you use a stepladder:  Make sure that it is fully opened. Do not climb a closed  stepladder.  Make sure that both sides of the stepladder are locked into place.  Ask someone to hold it for you, if possible.  Clearly mark and make sure that you can see:  Any grab bars or handrails.  First and last steps.  Where the edge of each step is.  Use tools that help you move around (mobility aids) if they are needed. These include:  Canes.  Walkers.  Scooters.  Crutches.  Turn on the lights when you go into a dark area. Replace any light bulbs as soon as they burn out.  Set up your furniture so you have a clear path. Avoid moving your furniture around.  If any of your floors are uneven, fix them.  If there are any pets around you, be aware of where they are.  Review your medicines with your doctor. Some medicines can make you feel dizzy. This can increase your chance of falling. Ask your doctor what other things that you can do to help prevent falls. This information is not intended to replace advice given to you by your health care provider. Make sure you discuss any questions you have with your health care provider. Document Released: 01/13/2009 Document Revised: 08/25/2015 Document Reviewed: 04/23/2014 Elsevier Interactive Patient Education  2017 [employer].

## 2019-03-16 NOTE — Progress Notes (Signed)
PCP notes:  Health Maintenance: No gaps   Abnormal Screenings: none   Patient concerns: none   Nurse concerns: none   Next PCP appt.: 03/23/2019 @ 2 pm

## 2019-03-23 ENCOUNTER — Encounter: Payer: Self-pay | Admitting: Family Medicine

## 2019-03-23 ENCOUNTER — Other Ambulatory Visit: Payer: Self-pay

## 2019-03-23 ENCOUNTER — Ambulatory Visit (INDEPENDENT_AMBULATORY_CARE_PROVIDER_SITE_OTHER): Payer: Medicare Other | Admitting: Family Medicine

## 2019-03-23 VITALS — BP 122/66 | HR 86 | Temp 96.9°F | Ht 69.5 in | Wt 289.3 lb

## 2019-03-23 DIAGNOSIS — E785 Hyperlipidemia, unspecified: Secondary | ICD-10-CM | POA: Diagnosis not present

## 2019-03-23 DIAGNOSIS — G4733 Obstructive sleep apnea (adult) (pediatric): Secondary | ICD-10-CM

## 2019-03-23 DIAGNOSIS — Z7189 Other specified counseling: Secondary | ICD-10-CM

## 2019-03-23 DIAGNOSIS — E538 Deficiency of other specified B group vitamins: Secondary | ICD-10-CM

## 2019-03-23 DIAGNOSIS — D352 Benign neoplasm of pituitary gland: Secondary | ICD-10-CM

## 2019-03-23 DIAGNOSIS — Z0000 Encounter for general adult medical examination without abnormal findings: Secondary | ICD-10-CM

## 2019-03-23 DIAGNOSIS — E1149 Type 2 diabetes mellitus with other diabetic neurological complication: Secondary | ICD-10-CM

## 2019-03-23 DIAGNOSIS — I10 Essential (primary) hypertension: Secondary | ICD-10-CM

## 2019-03-23 DIAGNOSIS — R4586 Emotional lability: Secondary | ICD-10-CM

## 2019-03-23 DIAGNOSIS — Z125 Encounter for screening for malignant neoplasm of prostate: Secondary | ICD-10-CM

## 2019-03-23 DIAGNOSIS — E119 Type 2 diabetes mellitus without complications: Secondary | ICD-10-CM

## 2019-03-23 LAB — BASIC METABOLIC PANEL
BUN: 28 mg/dL — ABNORMAL HIGH (ref 6–23)
CO2: 23 mEq/L (ref 19–32)
Calcium: 9.7 mg/dL (ref 8.4–10.5)
Chloride: 107 mEq/L (ref 96–112)
Creatinine, Ser: 1.3 mg/dL (ref 0.40–1.50)
GFR: 54.16 mL/min — ABNORMAL LOW (ref >60.00)
Glucose, Bld: 99 mg/dL (ref 70–99)
Potassium: 4.2 mEq/L (ref 3.5–5.1)
Sodium: 140 mEq/L (ref 135–145)

## 2019-03-23 MED ORDER — CYANOCOBALAMIN 1000 MCG/ML IJ SOLN
1000.0000 ug | Freq: Once | INTRAMUSCULAR | Status: AC
  Administered 2019-03-23: 1000 ug via INTRAMUSCULAR

## 2019-03-23 NOTE — Progress Notes (Signed)
This visit occurred during the SARS-CoV-2 public health emergency.  Safety protocols were in place, including screening questions prior to the visit, additional usage of staff PPE, and extensive cleaning of exam room while observing appropriate contact time as indicated for disinfecting solutions.  Flu shot 2020 [facility] UTD Shingrix d/w pt.   Tetanus may be cheaper at pharmacy, d/w pt.  Advance directive- wife designated if patient were incapacitated.  Cologuard 2020.  PSA prev per urology, d/w pt. D/w pt about recheck later, with next set of labs.   Covid considerations d/w pt.   He is still seeing Dr. Elsworth Soho with pulmonary re: OSA.    He has seen Dr. Clovis Pu with psych.  Mood is good.  I will defer.  He agrees.  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  He'll have f/u in 09/2020.    B12 def.  On replacement.  Due today.  Compliant. Labs d/w pt.    Hypertension/AF/VT s/p ablation.  He is off acebutolol and clearly feels better in the meantime. Using medication without problems or lightheadedness: yes Chest pain with exertion:no Edema:no Short of breath:no  Elevated Cholesterol: Using medications without problems:yes Muscle aches: no Diet compliance: yes Exercise: yes, improved since ablation.    Diabetes:  Using medications without difficulties:yes Hypoglycemic episodes: only very rarely, d/w pt about cautions.  Hyperglycemic episodes:no Feet problems: sig better with lyrica.   Blood Sugars averaging: usually ~ 120-130s eye exam within last year: yes  RLS.  Sx improved on requip.  No ADE on med.    His psoriatic arthritis is clearly better on current meds.    Meds, vitals, and allergies reviewed.   PMH and SH reviewed  ROS: Per HPI unless specifically indicated in ROS section   GEN: nad, alert and oriented HEENT: ncat NECK: supple w/o LA CV: rrr. PULM: ctab, no inc wob ABD: soft, +bs EXT: no edema SKIN: no acute rash  Diabetic foot exam: Normal inspection No  skin breakdown No calluses  Normal DP pulses Normal sensation to light touch and monofilament Nails normal

## 2019-03-23 NOTE — Patient Instructions (Addendum)
Check with your insurance to see if they will cover the shingles and tetanus shots.   Go to the lab on the way out.  We'll contact you with your lab report. Recheck in about 3-4 months with labs ahead of a visit.  Take care.  Glad to see you.  B12 shot today today.  Repeat B12 monthly. I'll work on your refills.

## 2019-03-25 ENCOUNTER — Other Ambulatory Visit: Payer: Self-pay | Admitting: Family Medicine

## 2019-03-25 DIAGNOSIS — I10 Essential (primary) hypertension: Secondary | ICD-10-CM

## 2019-03-25 MED ORDER — GLIPIZIDE 5 MG PO TABS: 5.0000 mg | ORAL_TABLET | Freq: Every day | ORAL | 3 refills | Status: DC

## 2019-03-25 MED ORDER — PRAVASTATIN SODIUM 20 MG PO TABS: 20.0000 mg | ORAL_TABLET | Freq: Every evening | ORAL | 3 refills | Status: DC

## 2019-03-25 MED ORDER — PREGABALIN 25 MG PO CAPS: ORAL_CAPSULE | ORAL | 1 refills | Status: DC

## 2019-03-25 MED ORDER — TORSEMIDE 20 MG PO TABS: 20.0000 mg | ORAL_TABLET | Freq: Every day | ORAL | Status: DC | PRN

## 2019-03-25 MED ORDER — LISINOPRIL 2.5 MG PO TABS: 2.5000 mg | ORAL_TABLET | Freq: Every evening | ORAL | 3 refills | Status: DC

## 2019-03-25 NOTE — Assessment & Plan Note (Signed)
No change in meds.  Continue statin.  He will try to work more on exercise since he is feeling better in the meantime.  We can recheck periodically.  He agrees.

## 2019-03-25 NOTE — Assessment & Plan Note (Signed)
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  He'll have f/u in 09/2020.   No new symptoms.  I will defer.  He agrees.

## 2019-03-25 NOTE — Assessment & Plan Note (Signed)
Advance directive- wife designated if patient were incapacitated.  

## 2019-03-25 NOTE — Assessment & Plan Note (Signed)
He is still seeing Dr. Elsworth Soho with pulmonary re: OSA.   Compliant.  I will defer.  He agrees.

## 2019-03-25 NOTE — Assessment & Plan Note (Signed)
On replacement.  Due today.  Compliant. Labs d/w pt.   he agrees.

## 2019-03-25 NOTE — Assessment & Plan Note (Signed)
Hypertension/AF/VT s/p ablation.  He is off acebutolol and clearly feels better in the meantime.  Continue as is.  I appreciate cardiology input.  Labs discussed with patient.

## 2019-03-25 NOTE — Assessment & Plan Note (Signed)
He has seen Dr. Clovis Pu with psych.  Mood is good.  I will defer.  He agrees.

## 2019-03-25 NOTE — Assessment & Plan Note (Signed)
Flu shot 2020 PNA UTD Shingrix d/w pt.   Tetanus may be cheaper at pharmacy, d/w pt.  Advance directive- wife designated if patient were incapacitated.  Cologuard 2020.  PSA prev per urology, d/w pt. D/w pt about recheck later, with next set of labs.

## 2019-03-25 NOTE — Assessment & Plan Note (Signed)
No change in meds.  He will try to work more on exercise since he is feeling better in the meantime.  We can recheck periodically.  He agrees.  His neuropathy pain is clearly better in the meantime with Lyrica.  No adverse effect on medication. >25 minutes spent in face to face time with patient, >50% spent in counselling or coordination of care

## 2019-03-30 ENCOUNTER — Telehealth: Payer: Self-pay | Admitting: Rheumatology

## 2019-03-30 NOTE — Telephone Encounter (Signed)
Patient called requesting a return call with the status of his [place] application.  Patient states he dropped off the application at the office after Thanksgiving and was told they would give the application to [place].

## 2019-03-30 NOTE — Telephone Encounter (Signed)
Returned patient's call,left message.  I called [employer] [name]) and they did not received fax of patient's portion from office on 03/05/19. Refaxed application and received confirmation.   3:48 PM Beatriz Chancellor, CPhT

## 2019-04-07 NOTE — Telephone Encounter (Signed)
Received fax from [place] requesting prescription for patient's application. Called rep Clarise Cruz confirmed that they did receive the prescription. Application will be updated and processed

## 2019-04-08 NOTE — Telephone Encounter (Signed)
Received fax from [employer] through [employer], patient's renewal application has been APPROVED. Coverage dates are from 05/29/19 to 04/01/20.   Will send documents to [place].   Phone# [phone] Fax# [phone]

## 2019-04-10 DIAGNOSIS — I472 Ventricular tachycardia: Secondary | ICD-10-CM | POA: Diagnosis not present

## 2019-04-10 DIAGNOSIS — Z7901 Long term (current) use of anticoagulants: Secondary | ICD-10-CM | POA: Diagnosis not present

## 2019-04-10 DIAGNOSIS — I10 Essential (primary) hypertension: Secondary | ICD-10-CM | POA: Diagnosis not present

## 2019-04-10 DIAGNOSIS — I5032 Chronic diastolic (congestive) heart failure: Secondary | ICD-10-CM | POA: Diagnosis not present

## 2019-04-10 DIAGNOSIS — I2510 Atherosclerotic heart disease of native coronary artery without angina pectoris: Secondary | ICD-10-CM | POA: Diagnosis not present

## 2019-04-10 DIAGNOSIS — I5033 Acute on chronic diastolic (congestive) heart failure: Secondary | ICD-10-CM | POA: Diagnosis not present

## 2019-04-23 ENCOUNTER — Other Ambulatory Visit: Payer: Self-pay | Admitting: Family Medicine

## 2019-04-27 MED ORDER — ROPINIROLE HCL 0.5 MG PO TABS: 1.0000 mg | ORAL_TABLET | Freq: Every day | ORAL | 1 refills | Status: DC

## 2019-04-28 ENCOUNTER — Ambulatory Visit: Payer: Medicare Other

## 2019-04-28 DIAGNOSIS — I472 Ventricular tachycardia: Secondary | ICD-10-CM | POA: Diagnosis not present

## 2019-05-04 ENCOUNTER — Telehealth: Payer: Self-pay

## 2019-05-04 NOTE — Telephone Encounter (Signed)
Pt has NV on 2/3 for B12 injection. Pt needs to be screened for covid. LVM

## 2019-05-06 ENCOUNTER — Other Ambulatory Visit: Payer: Self-pay

## 2019-05-06 ENCOUNTER — Ambulatory Visit (INDEPENDENT_AMBULATORY_CARE_PROVIDER_SITE_OTHER): Payer: Medicare Other | Admitting: *Deleted

## 2019-05-06 DIAGNOSIS — E538 Deficiency of other specified B group vitamins: Secondary | ICD-10-CM

## 2019-05-06 MED ORDER — CYANOCOBALAMIN 1000 MCG/ML IJ SOLN
1000.0000 ug | Freq: Once | INTRAMUSCULAR | Status: AC
  Administered 2019-05-06: 1000 ug via INTRAMUSCULAR

## 2019-05-06 NOTE — Progress Notes (Signed)
Per orders of Alma Friendly, NP in Dr Josefine Class absence, injection of Vit B12 given by Virl Cagey. Patient tolerated injection well.

## 2019-05-23 ENCOUNTER — Other Ambulatory Visit: Payer: Self-pay | Admitting: Rheumatology

## 2019-05-25 NOTE — Telephone Encounter (Signed)
Last Visit: 02/09/2019 Next Visit: 08/10/2019  Okay to refill per Dr. Estanislado Pandy.

## 2019-05-29 ENCOUNTER — Other Ambulatory Visit: Payer: Self-pay | Admitting: *Deleted

## 2019-05-29 ENCOUNTER — Encounter: Payer: Self-pay | Admitting: Family Medicine

## 2019-05-29 MED ORDER — ACCU-CHEK FASTCLIX LANCETS MISC: 3 refills | Status: DC

## 2019-05-29 MED ORDER — ACCU-CHEK GUIDE VI STRP: ORAL_STRIP | 3 refills | Status: DC

## 2019-06-08 ENCOUNTER — Telehealth: Payer: Self-pay

## 2019-06-08 NOTE — Telephone Encounter (Signed)
Pt has nurse visit tomorrow. Needs screened for covid. LVM

## 2019-06-09 ENCOUNTER — Ambulatory Visit (INDEPENDENT_AMBULATORY_CARE_PROVIDER_SITE_OTHER): Payer: Medicare Other | Admitting: *Deleted

## 2019-06-09 ENCOUNTER — Other Ambulatory Visit: Payer: Self-pay

## 2019-06-09 DIAGNOSIS — E538 Deficiency of other specified B group vitamins: Secondary | ICD-10-CM | POA: Diagnosis not present

## 2019-06-09 MED ORDER — CYANOCOBALAMIN 1000 MCG/ML IJ SOLN
1000.0000 ug | Freq: Once | INTRAMUSCULAR | Status: AC
  Administered 2019-06-09: 1000 ug via INTRAMUSCULAR

## 2019-06-09 NOTE — Telephone Encounter (Signed)
Called patient back and left a voicemail for patient to give [place] a call back before coming in office if possible!

## 2019-06-09 NOTE — Telephone Encounter (Signed)
[facility] - Client Nonclinical Telephone Record AccessNurse Client [facility] - Client Client Site [place] Physician Renford Dills - MD Contact Type Call Who Is Calling Patient / Member / Family / Caregiver Caller Name [name] [place] Phone Number n/a Call Type Message Only Information Provided Reason for Call Returning a Call from the Office Initial [name] states he is returning a call. [name] called about a Covid 19 test. Additional Comment [name] called back but did not leave his phone number or date of birth. Disp. Time Disposition Final User 06/08/2019 5:28:44 PM General Information Provided Yes [place], Berdi Call Closed By: Bonnita Nasuti Transaction Date/Time: 06/08/2019 5:25:58 PM (ET)

## 2019-06-09 NOTE — Telephone Encounter (Signed)
Noted  

## 2019-06-09 NOTE — Progress Notes (Signed)
Per orders of Dr. [name], injection of Vit B12 given by Nyoka Cowden, Vennela Jutte M. Patient tolerated injection well.

## 2019-06-18 ENCOUNTER — Other Ambulatory Visit: Payer: Self-pay

## 2019-06-18 ENCOUNTER — Other Ambulatory Visit (INDEPENDENT_AMBULATORY_CARE_PROVIDER_SITE_OTHER): Payer: Medicare Other

## 2019-06-18 DIAGNOSIS — Z125 Encounter for screening for malignant neoplasm of prostate: Secondary | ICD-10-CM

## 2019-06-18 DIAGNOSIS — E119 Type 2 diabetes mellitus without complications: Secondary | ICD-10-CM | POA: Diagnosis not present

## 2019-06-18 LAB — BASIC METABOLIC PANEL
BUN: 20 mg/dL (ref 6–23)
CO2: 26 mEq/L (ref 19–32)
Calcium: 9 mg/dL (ref 8.4–10.5)
Chloride: 104 mEq/L (ref 96–112)
Creatinine, Ser: 1.05 mg/dL (ref 0.40–1.50)
GFR: 69.26 mL/min (ref >60.00)
Glucose, Bld: 163 mg/dL — ABNORMAL HIGH (ref 70–99)
Potassium: 3.9 mEq/L (ref 3.5–5.1)
Sodium: 138 mEq/L (ref 135–145)

## 2019-06-18 LAB — LIPID PANEL
Cholesterol: 132 mg/dL (ref 0–200)
HDL: 26.4 mg/dL — ABNORMAL LOW (ref >39.00)
NonHDL: 105.24
Total CHOL/HDL Ratio: 5
Triglycerides: 201 mg/dL — ABNORMAL HIGH (ref 0.0–149.0)
VLDL: 40.2 mg/dL — ABNORMAL HIGH (ref 0.0–40.0)

## 2019-06-18 LAB — HEMOGLOBIN A1C: Hgb A1c MFr Bld: 6.1 % (ref 4.6–6.5)

## 2019-06-18 LAB — LDL CHOLESTEROL, DIRECT: Direct LDL: 75 mg/dL

## 2019-06-18 LAB — PSA, MEDICARE: PSA: 0.8 ng/ml (ref 0.10–4.00)

## 2019-06-19 ENCOUNTER — Other Ambulatory Visit: Payer: Self-pay | Admitting: Psychiatry

## 2019-06-22 ENCOUNTER — Ambulatory Visit (INDEPENDENT_AMBULATORY_CARE_PROVIDER_SITE_OTHER): Payer: Medicare Other | Admitting: Family Medicine

## 2019-06-22 ENCOUNTER — Other Ambulatory Visit: Payer: Self-pay

## 2019-06-22 ENCOUNTER — Encounter: Payer: Self-pay | Admitting: Family Medicine

## 2019-06-22 DIAGNOSIS — I5033 Acute on chronic diastolic (congestive) heart failure: Secondary | ICD-10-CM

## 2019-06-22 DIAGNOSIS — E1149 Type 2 diabetes mellitus with other diabetic neurological complication: Secondary | ICD-10-CM

## 2019-06-22 DIAGNOSIS — I509 Heart failure, unspecified: Secondary | ICD-10-CM | POA: Diagnosis not present

## 2019-06-22 MED ORDER — PREGABALIN 25 MG PO CAPS: ORAL_CAPSULE | ORAL | 1 refills | Status: DC

## 2019-06-22 NOTE — Patient Instructions (Signed)
Try taking an extra tab of lyrica in the AM but if you have trouble/more swelling then let me know and cut back on the dose.   Recheck in about 3 months.  Labs at the visit if needed.  Keep working on diet and exercise as that can help your lipids and sugar.  Take care.  Glad to see you.

## 2019-06-22 NOTE — Progress Notes (Signed)
is visit occurred during the SARS-CoV-2 public health emergency.  Safety protocols were in place, including screening questions prior to the visit, additional usage of staff PPE, and extensive cleaning of exam room while observing appropriate contact time as indicated for disinfecting solutions.  S/p ablation, off torsemide and back on lasix.  Cr improved from 1.3 down to 1.05.  No CP.  Not SOB.    Diabetes:  Using medications without difficulties: yes Hypoglycemic episodes: not unless prolonged fasting and this is very rare, cautions d/w pt.   Hyperglycemic episodes: no Feet problems: burning pain.  Legs are clearly better he improved with lyrica but the feet are still bothersome.   Taking lyrica 1 in the AM and 2 in the PM.   Blood Sugars averaging: ~120s eye exam within last year: yes A1c controlled. D/w pt at OV.    He had covid vaccine, d/w pt.    He'll work more on diet and exercise.    Meds, vitals, and allergies reviewed.   ROS: Per HPI unless specifically indicated in ROS section   GEN: nad, alert and oriented HEENT: ncat NECK: supple w/o LA CV: rrr. PULM: ctab, no inc wob ABD: soft, +bs EXT: no edema SKIN: no acute rash

## 2019-06-24 NOTE — Assessment & Plan Note (Signed)
A1c controlled. D/w pt at OV.  Continue glipizide/Metformin.  Reasonable to increase Lyrica to 2 tablets in the morning and 2 tablets in the evening.  He will update me as needed.  Routine cautions given to patient.  He had covid vaccine, d/w pt.    He'll work more on diet and exercise.

## 2019-06-24 NOTE — Assessment & Plan Note (Signed)
S/p ablation, off torsemide and back on lasix.  Cr improved from 1.3 down to 1.05.  No CP.  Not SOB.  Doing well.  Continue as is.  He agrees.

## 2019-07-14 ENCOUNTER — Telehealth: Payer: Self-pay

## 2019-07-14 NOTE — Telephone Encounter (Signed)
Pt has NV tomorrow and needs screened for covid symptoms.  LVM

## 2019-07-15 ENCOUNTER — Ambulatory Visit (INDEPENDENT_AMBULATORY_CARE_PROVIDER_SITE_OTHER): Payer: Medicare Other

## 2019-07-15 DIAGNOSIS — E538 Deficiency of other specified B group vitamins: Secondary | ICD-10-CM

## 2019-07-15 MED ORDER — CYANOCOBALAMIN 1000 MCG/ML IJ SOLN
1000.0000 ug | Freq: Once | INTRAMUSCULAR | Status: AC
  Administered 2019-07-15: 1000 ug via INTRAMUSCULAR

## 2019-07-15 NOTE — Progress Notes (Signed)
Per orders of Dr. Earl Lagos in his absence by Dr Danise Mina, injection of monthly B12 given by Kris Mouton. Patient tolerated injection well.
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
## 2019-08-10 ENCOUNTER — Telehealth: Payer: Self-pay

## 2019-08-10 ENCOUNTER — Ambulatory Visit (INDEPENDENT_AMBULATORY_CARE_PROVIDER_SITE_OTHER): Payer: Medicare Other | Admitting: Rheumatology

## 2019-08-10 ENCOUNTER — Encounter: Payer: Self-pay | Admitting: Rheumatology

## 2019-08-10 ENCOUNTER — Other Ambulatory Visit: Payer: Self-pay

## 2019-08-10 VITALS — BP 151/84 | HR 64 | Resp 20 | Ht 69.5 in | Wt 288.8 lb

## 2019-08-10 DIAGNOSIS — M7662 Achilles tendinitis, left leg: Secondary | ICD-10-CM

## 2019-08-10 DIAGNOSIS — Z79899 Other long term (current) drug therapy: Secondary | ICD-10-CM

## 2019-08-10 DIAGNOSIS — Z87448 Personal history of other diseases of urinary system: Secondary | ICD-10-CM | POA: Diagnosis not present

## 2019-08-10 DIAGNOSIS — L4050 Arthropathic psoriasis, unspecified: Secondary | ICD-10-CM | POA: Diagnosis not present

## 2019-08-10 DIAGNOSIS — M19042 Primary osteoarthritis, left hand: Secondary | ICD-10-CM

## 2019-08-10 DIAGNOSIS — L409 Psoriasis, unspecified: Secondary | ICD-10-CM | POA: Diagnosis not present

## 2019-08-10 DIAGNOSIS — Z8679 Personal history of other diseases of the circulatory system: Secondary | ICD-10-CM | POA: Diagnosis not present

## 2019-08-10 DIAGNOSIS — I472 Ventricular tachycardia, unspecified: Secondary | ICD-10-CM

## 2019-08-10 DIAGNOSIS — Z8669 Personal history of other diseases of the nervous system and sense organs: Secondary | ICD-10-CM

## 2019-08-10 DIAGNOSIS — M19041 Primary osteoarthritis, right hand: Secondary | ICD-10-CM | POA: Diagnosis not present

## 2019-08-10 DIAGNOSIS — M19071 Primary osteoarthritis, right ankle and foot: Secondary | ICD-10-CM

## 2019-08-10 DIAGNOSIS — M25472 Effusion, left ankle: Secondary | ICD-10-CM

## 2019-08-10 DIAGNOSIS — M19072 Primary osteoarthritis, left ankle and foot: Secondary | ICD-10-CM

## 2019-08-10 DIAGNOSIS — G2581 Restless legs syndrome: Secondary | ICD-10-CM

## 2019-08-10 DIAGNOSIS — Z96653 Presence of artificial knee joint, bilateral: Secondary | ICD-10-CM | POA: Diagnosis not present

## 2019-08-10 DIAGNOSIS — Z8639 Personal history of other endocrine, nutritional and metabolic disease: Secondary | ICD-10-CM

## 2019-08-10 NOTE — Patient Instructions (Signed)
Standing Labs We placed an order today for your standing lab work.    Please come back and get your standing labs in 2 weeks x2, 2 months and then every 3 months.   We have open lab daily Monday through Thursday from 8:30-12:30 PM and 1:30-4:30 PM and Friday from 8:30-12:30 PM and 1:30-4:00 PM at the office of Dr. Bo Merino.   You may experience shorter wait times on Monday and Friday afternoons. The office is located at [address], [place], [place], [place] [address] No appointment is necessary.   Labs are drawn by [facility].  You may receive a bill from [facility] for your lab work.  If you wish to have your labs drawn at another location, please call the office 24 hours in advance to send orders.  If you have any questions regarding directions or hours of operation,  please call [phone].   Just as a reminder please drink plenty of water prior to coming for your lab work. Thanks!   Methotrexate tablets What is this medicine? METHOTREXATE (METH oh TREX ate) is a chemotherapy drug used to treat cancer including breast cancer, leukemia, and lymphoma. This medicine can also be used to treat psoriasis and certain kinds of arthritis. This medicine may be used for other purposes; ask your health care provider or pharmacist if you have questions. COMMON BRAND NAME(S): Rheumatrex, Trexall What should I tell my health care provider before I take this medicine? They need to know if you have any of these conditions:  fluid in the stomach area or lungs  if you often drink alcohol  infection or immune system problems  kidney disease or on hemodialysis  liver disease  low blood counts, like low white cell, platelet, or red cell counts  lung disease  radiation therapy  stomach ulcers  ulcerative colitis  an unusual or allergic reaction to methotrexate, other medicines, foods, dyes, or preservatives  pregnant or trying to get pregnant  breast-feeding How should I  use this medicine? Take this medicine by mouth with a glass of water. Follow the directions on the prescription label. Take your medicine at regular intervals. Do not take it more often than directed. Do not stop taking except on your doctor's advice. Make sure you know why you are taking this medicine and how often you should take it. If this medicine is used for a condition that is not cancer, like arthritis or psoriasis, it should be taken weekly, NOT daily. Taking this medicine more often than directed can cause serious side effects, even death. Talk to your healthcare provider about safe handling and disposal of this medicine. You may need to take special precautions. Talk to your pediatrician regarding the use of this medicine in children. While this drug may be prescribed for selected conditions, precautions do apply. Overdosage: If you think you have taken too much of this medicine contact a poison control center or emergency room at once. NOTE: This medicine is only for you. Do not share this medicine with others. What if I miss a dose? If you miss a dose, talk with your doctor or health care professional. Do not take double or extra doses. What may interact with this medicine? This medicine may interact with the following medication:  acitretin  aspirin and aspirin-like medicines including salicylates  azathioprine  certain antibiotics like penicillins, tetracycline, and chloramphenicol  cyclosporine  gold  hydroxychloroquine  live virus vaccines  NSAIDs, medicines for pain and inflammation, like ibuprofen or naproxen  other cytotoxic agents  penicillamine  phenylbutazone  phenytoin  probenecid  retinoids such as isotretinoin and tretinoin  steroid medicines like prednisone or cortisone  sulfonamides like sulfasalazine and trimethoprim/sulfamethoxazole  theophylline This list may not describe all possible interactions. Give your health care provider a list of  all the medicines, herbs, non-prescription drugs, or dietary supplements you use. Also tell them if you smoke, drink alcohol, or use illegal drugs. Some items may interact with your medicine. What should I watch for while using this medicine? Avoid alcoholic drinks. This medicine can make you more sensitive to the sun. Keep out of the sun. If you cannot avoid being in the sun, wear protective clothing and use sunscreen. Do not use sun lamps or tanning beds/booths. You may need blood work done while you are taking this medicine. Call your doctor or health care professional for advice if you get a fever, chills or sore throat, or other symptoms of a cold or flu. Do not treat yourself. This drug decreases your body's ability to fight infections. Try to avoid being around people who are sick. This medicine may increase your risk to bruise or bleed. Call your doctor or health care professional if you notice any unusual bleeding. Check with your doctor or health care professional if you get an attack of severe diarrhea, nausea and vomiting, or if you sweat a lot. The loss of too much body fluid can make it dangerous for you to take this medicine. Talk to your doctor about your risk of cancer. You may be more at risk for certain types of cancers if you take this medicine. Both men and women must use effective birth control with this medicine. Do not become pregnant while taking this medicine or until at least 1 normal menstrual cycle has occurred after stopping it. Women should inform their doctor if they wish to become pregnant or think they might be pregnant. Men should not father a child while taking this medicine and for 3 months after stopping it. There is a potential for serious side effects to an unborn child. Talk to your health care professional or pharmacist for more information. Do not breast-feed an infant while taking this medicine. What side effects may I notice from receiving this medicine? Side  effects that you should report to your doctor or health care professional as soon as possible:  allergic reactions like skin rash, itching or hives, swelling of the face, lips, or tongue  breathing problems or shortness of breath  diarrhea  dry, nonproductive cough  low blood counts - this medicine may decrease the number of white blood cells, red blood cells and platelets. You may be at increased risk for infections and bleeding.  mouth sores  redness, blistering, peeling or loosening of the skin, including inside the mouth  signs of infection - fever or chills, cough, sore throat, pain or trouble passing urine  signs and symptoms of bleeding such as bloody or black, tarry stools; red or dark-brown urine; spitting up blood or brown material that looks like coffee grounds; red spots on the skin; unusual bruising or bleeding from the eye, gums, or nose  signs and symptoms of kidney injury like trouble passing urine or change in the amount of urine  signs and symptoms of liver injury like dark yellow or brown urine; general ill feeling or flu-like symptoms; light-colored stools; loss of appetite; nausea; right upper belly pain; unusually weak or tired; yellowing of the eyes or skin Side effects that  usually do not require medical attention (report to your doctor or health care professional if they continue or are bothersome):  dizziness  hair loss  tiredness  upset stomach  vomiting This list may not describe all possible side effects. Call your doctor for medical advice about side effects. You may report side effects to FDA at 1-800-FDA-1088. Where should I keep my medicine? Keep out of the reach of children. Store at room temperature between 20 and 25 degrees C (68 and 77 degrees F). Protect from light. Throw away any unused medicine after the expiration date. NOTE: This sheet is a summary. It may not cover all possible information. If you have questions about this medicine,  talk to your doctor, pharmacist, or health care provider.  2020 Elsevier/Gold Standard (2016-11-08 13:38:43)

## 2019-08-10 NOTE — Telephone Encounter (Addendum)
Per Dr. Estanislado Pandy, once labs result, patient will be starting methotrexate and folic acid.  Patient requested that both prescriptions be sent to [facility] in [place].

## 2019-08-12 LAB — COMPLETE METABOLIC PANEL WITH GFR
AG Ratio: 1.3 (calc) (ref 1.0–2.5)
ALT: 9 U/L (ref 9–46)
AST: 10 U/L (ref 10–35)
Albumin: 3.9 g/dL (ref 3.6–5.1)
Alkaline phosphatase (APISO): 117 U/L (ref 35–144)
BUN: 18 mg/dL (ref 7–25)
CO2: 27 mmol/L (ref 20–32)
Calcium: 9.3 mg/dL (ref 8.6–10.3)
Chloride: 105 mmol/L (ref 98–110)
Creat: 1.07 mg/dL (ref 0.70–1.18)
GFR, Est African American: 79 mL/min/{1.73_m2} (ref >=60)
GFR, Est Non African American: 68 mL/min/{1.73_m2} (ref >=60)
Globulin: 3 g/dL (calc) (ref 1.9–3.7)
Glucose, Bld: 122 mg/dL — ABNORMAL HIGH (ref 65–99)
Potassium: 3.6 mmol/L (ref 3.5–5.3)
Sodium: 141 mmol/L (ref 135–146)
Total Bilirubin: 0.8 mg/dL (ref 0.2–1.2)
Total Protein: 6.9 g/dL (ref 6.1–8.1)

## 2019-08-12 LAB — IGG, IGA, IGM
IgG (Immunoglobin G), Serum: 1213 mg/dL (ref 600–1540)
IgM, Serum: 46 mg/dL — ABNORMAL LOW (ref 50–300)
Immunoglobulin A: 481 mg/dL — ABNORMAL HIGH (ref 70–320)

## 2019-08-12 LAB — CBC WITH DIFFERENTIAL/PLATELET
Absolute Monocytes: 459 cells/uL (ref 200–950)
Basophils Absolute: 49 cells/uL (ref 0–200)
Basophils Relative: 0.6 %
Eosinophils Absolute: 328 cells/uL (ref 15–500)
Eosinophils Relative: 4 %
HCT: 33.1 % — ABNORMAL LOW (ref 38.5–50.0)
Hemoglobin: 11 g/dL — ABNORMAL LOW (ref 13.2–17.1)
Lymphs Abs: 1189 cells/uL (ref 850–3900)
MCH: 30.5 pg (ref 27.0–33.0)
MCHC: 33.2 g/dL (ref 32.0–36.0)
MCV: 91.7 fL (ref 80.0–100.0)
MPV: 10.6 fL (ref 7.5–12.5)
Monocytes Relative: 5.6 %
Neutro Abs: 6175 cells/uL (ref 1500–7800)
Neutrophils Relative %: 75.3 %
Platelets: 238 10*3/uL (ref 140–400)
RBC: 3.61 10*6/uL — ABNORMAL LOW (ref 4.20–5.80)
RDW: 13.3 % (ref 11.0–15.0)
Total Lymphocyte: 14.5 %
WBC: 8.2 10*3/uL (ref 3.8–10.8)

## 2019-08-12 LAB — QUANTIFERON-TB GOLD PLUS
Mitogen-NIL: >10 IU/mL
NIL: 0.02 IU/mL
QuantiFERON-TB Gold Plus: NEGATIVE
TB1-NIL: 0.01 IU/mL
TB2-NIL: 0 IU/mL

## 2019-08-12 LAB — HIV ANTIBODY (ROUTINE TESTING W REFLEX): HIV 1&2 Ab, 4th Generation: NONREACTIVE

## 2019-08-12 MED ORDER — FOLIC ACID 1 MG PO TABS: 2.0000 mg | ORAL_TABLET | Freq: Every day | ORAL | 3 refills | Status: DC

## 2019-08-12 MED ORDER — METHOTREXATE 2.5 MG PO TABS: ORAL_TABLET | ORAL | 0 refills | Status: DC

## 2019-08-12 NOTE — Telephone Encounter (Signed)
Left message to advise patient Mild anemia noted.  All other labs are stable. Advised would be sending prescription to the [facility] in [place] as requested. Reminded patient he will be due for labs in 2 weeks.   Per office note on 08/10/2019: Once the labs are available we will start him on methotrexate 6 tablets p.o. weekly for 2 weeks if labs are stable we will increase it to 8 tablets p.o. weekly.   He will also need folic acid 2 mg p.o. daily

## 2019-08-12 NOTE — Progress Notes (Signed)
Mild anemia noted.  All other labs are stable.  Okay to start on methotrexate as mentioned in the last office note.

## 2019-08-12 NOTE — Addendum Note (Signed)
Addended by: Carole Binning on: 08/12/2019 09:17 AM   Modules accepted: Orders

## 2019-08-17 ENCOUNTER — Encounter: Payer: Self-pay | Admitting: Rheumatology

## 2019-08-18 ENCOUNTER — Ambulatory Visit (INDEPENDENT_AMBULATORY_CARE_PROVIDER_SITE_OTHER): Payer: Medicare Other | Admitting: *Deleted

## 2019-08-18 DIAGNOSIS — E538 Deficiency of other specified B group vitamins: Secondary | ICD-10-CM

## 2019-08-18 MED ORDER — CYANOCOBALAMIN 1000 MCG/ML IJ SOLN
1000.0000 ug | Freq: Once | INTRAMUSCULAR | Status: AC
  Administered 2019-08-18: 1000 ug via INTRAMUSCULAR

## 2019-08-18 NOTE — Progress Notes (Signed)
Pt received B12 1071mcg/ml 1 ml in right deltoid. Pt tolerated well. Sent to Dr Einar Pheasant in Dr Josefine Class absence.

## 2019-08-18 NOTE — Telephone Encounter (Signed)
Fatigue is not unusual after methotrexate injection.  Although low-grade fever and chills are unusual.  It seems patient is sensitive to methotrexate and may not be able to continue it.  We can switch him to leflunomide.  If he wants to read about leflunomide he can do some online reading and then we can schedule an appointment to start him on the medication.  He declined [facility].

## 2019-08-18 NOTE — Telephone Encounter (Signed)
Patient caled in reference to My Chart message. Patient states he is not feeling well at all, and requests a call back ASAP.

## 2019-08-18 NOTE — Telephone Encounter (Signed)
Advised patient Fatigue is not unusual after methotrexate injection.  Although low-grade fever and chills are unusual.  It seems patient is sensitive to methotrexate and may not be able to continue it.  We can switch him to leflunomide.  If he wants to read about leflunomide he can do some online reading and then we can schedule an appointment to start him on the medication. Patient verbalized understanding.   Patient states he has also had diarrhea since Sunday and has overall felt bad since taking the methotrexate. He will read about leflunomide and call to schedule an appointment to start the medication.

## 2019-08-20 ENCOUNTER — Other Ambulatory Visit: Payer: Self-pay | Admitting: Rheumatology

## 2019-08-20 NOTE — Telephone Encounter (Signed)
Last Visit: 08/10/2019 Next Visit: 09/22/2019  Okay to refill per Dr. Estanislado Pandy.
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
## 2019-09-14 ENCOUNTER — Other Ambulatory Visit: Payer: Self-pay | Admitting: Psychiatry

## 2019-09-22 ENCOUNTER — Ambulatory Visit (INDEPENDENT_AMBULATORY_CARE_PROVIDER_SITE_OTHER): Payer: Medicare Other | Admitting: Family Medicine

## 2019-09-22 ENCOUNTER — Other Ambulatory Visit: Payer: Self-pay

## 2019-09-22 ENCOUNTER — Encounter: Payer: Self-pay | Admitting: Family Medicine

## 2019-09-22 ENCOUNTER — Ambulatory Visit: Payer: Medicare Other | Admitting: Rheumatology

## 2019-09-22 VITALS — BP 136/70 | HR 76 | Temp 97.1°F | Ht 69.25 in | Wt 283.3 lb

## 2019-09-22 DIAGNOSIS — E119 Type 2 diabetes mellitus without complications: Secondary | ICD-10-CM

## 2019-09-22 DIAGNOSIS — E1149 Type 2 diabetes mellitus with other diabetic neurological complication: Secondary | ICD-10-CM | POA: Diagnosis not present

## 2019-09-22 DIAGNOSIS — E538 Deficiency of other specified B group vitamins: Secondary | ICD-10-CM

## 2019-09-22 LAB — POCT GLYCOSYLATED HEMOGLOBIN (HGB A1C): Hemoglobin A1C: 6.1 % — AB (ref 4.0–5.6)

## 2019-09-22 MED ORDER — CYANOCOBALAMIN 1000 MCG/ML IJ SOLN
1000.0000 ug | Freq: Once | INTRAMUSCULAR | Status: AC
  Administered 2019-09-22: 1000 ug via INTRAMUSCULAR

## 2019-09-22 NOTE — Progress Notes (Signed)
This visit occurred during the SARS-CoV-2 public health emergency.  Safety protocols were in place, including screening questions prior to the visit, additional usage of staff PPE, and extensive cleaning of exam room while observing appropriate contact time as indicated for disinfecting solutions.  Diabetes:  Using medications without difficulties: yes Hypoglycemic episodes:not unless prolonged fasting.   Hyperglycemic episodes: no Feet problems: some pain and tinging, slightly worse than prev but not at the point of needing to change his meds.  Some days are better than others.  voltaren gel helped.   Blood Sugars averaging: 120-140s.  Max 170, none >200.   eye exam within last year: yes A1c 6.1.   Metformin and glipizide at baseline.   Due for B12 shot.  Done at office visit.  Meds, vitals, and allergies reviewed.   ROS: Per HPI unless specifically indicated in ROS section   GEN: nad, alert and oriented HEENT: ncat NECK: supple w/o LA CV: rrr. PULM: ctab, no inc wob ABD: soft, +bs EXT: trace BLE edema SKIN: no acute rash

## 2019-09-22 NOTE — Patient Instructions (Signed)
Recheck A1c at a visit in about 4 months. You don't have to fast.  Continue monthly B12 shots.  1 dose today.   Update me as needed.  Take care.  Glad to see you.

## 2019-09-23 ENCOUNTER — Other Ambulatory Visit: Payer: Self-pay | Admitting: Family Medicine

## 2019-09-23 NOTE — Assessment & Plan Note (Signed)
Continue monthly B12 shots.  

## 2019-09-23 NOTE — Assessment & Plan Note (Signed)
No change in meds at this point.  A1c reasonable Recheck A1c at a visit in about 4 months. Update me as needed.  He agrees to plan.
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
## 2019-10-16 ENCOUNTER — Other Ambulatory Visit: Payer: Self-pay

## 2019-10-16 ENCOUNTER — Encounter: Payer: Self-pay | Admitting: Rheumatology

## 2019-10-16 ENCOUNTER — Ambulatory Visit (INDEPENDENT_AMBULATORY_CARE_PROVIDER_SITE_OTHER): Payer: Medicare Other | Admitting: Rheumatology

## 2019-10-16 VITALS — BP 128/76 | HR 73 | Resp 17 | Ht 69.5 in | Wt 286.2 lb

## 2019-10-16 DIAGNOSIS — Z8639 Personal history of other endocrine, nutritional and metabolic disease: Secondary | ICD-10-CM

## 2019-10-16 DIAGNOSIS — L409 Psoriasis, unspecified: Secondary | ICD-10-CM

## 2019-10-16 DIAGNOSIS — G2581 Restless legs syndrome: Secondary | ICD-10-CM

## 2019-10-16 DIAGNOSIS — I472 Ventricular tachycardia, unspecified: Secondary | ICD-10-CM

## 2019-10-16 DIAGNOSIS — Z8669 Personal history of other diseases of the nervous system and sense organs: Secondary | ICD-10-CM

## 2019-10-16 DIAGNOSIS — Z87448 Personal history of other diseases of urinary system: Secondary | ICD-10-CM | POA: Diagnosis not present

## 2019-10-16 DIAGNOSIS — M19071 Primary osteoarthritis, right ankle and foot: Secondary | ICD-10-CM

## 2019-10-16 DIAGNOSIS — M25532 Pain in left wrist: Secondary | ICD-10-CM | POA: Diagnosis not present

## 2019-10-16 DIAGNOSIS — Z79899 Other long term (current) drug therapy: Secondary | ICD-10-CM

## 2019-10-16 DIAGNOSIS — M19072 Primary osteoarthritis, left ankle and foot: Secondary | ICD-10-CM

## 2019-10-16 DIAGNOSIS — Z96653 Presence of artificial knee joint, bilateral: Secondary | ICD-10-CM

## 2019-10-16 DIAGNOSIS — Z8679 Personal history of other diseases of the circulatory system: Secondary | ICD-10-CM | POA: Diagnosis not present

## 2019-10-16 DIAGNOSIS — M19041 Primary osteoarthritis, right hand: Secondary | ICD-10-CM | POA: Diagnosis not present

## 2019-10-16 DIAGNOSIS — L4050 Arthropathic psoriasis, unspecified: Secondary | ICD-10-CM

## 2019-10-16 DIAGNOSIS — M19042 Primary osteoarthritis, left hand: Secondary | ICD-10-CM

## 2019-10-16 MED ORDER — LIDOCAINE HCL 1 % IJ SOLN
1.0000 mL | INTRAMUSCULAR | Status: AC | PRN
  Administered 2019-10-16: 1 mL

## 2019-10-16 MED ORDER — TRIAMCINOLONE ACETONIDE 40 MG/ML IJ SUSP
20.0000 mg | INTRAMUSCULAR | Status: AC | PRN
  Administered 2019-10-16: 20 mg via INTRA_ARTICULAR

## 2019-10-16 NOTE — Telephone Encounter (Signed)
Pending lab results, patient will be starting arava per Dr. Estanislado Pandy. Consent obtained and sent to the scan center.

## 2019-10-16 NOTE — Patient Instructions (Signed)
Standing Labs We placed an order today for your standing lab work.   Please have your standing labs drawn in 2 weeks x 2 and then in 2 months.  If possible, please have your labs drawn 2 weeks prior to your appointment so that the provider can discuss your results at your appointment.  We have open lab daily Monday through Thursday from 8:30-12:30 PM and 1:30-4:30 PM and Friday from 8:30-12:30 PM and 1:30-4:00 PM at the office of Dr. Bo Merino, [facility].   Please be advised, patients with office appointments requiring lab work will take precedents over walk-in lab work.  If possible, please come for your lab work on Monday and Friday afternoons, as you may experience shorter wait times. The office is located at [address], [place], [place], [place] [address] No appointment is necessary.   Labs are drawn by [facility]. Please bring your co-pay at the time of your lab draw.  You may receive a bill from [facility] for your lab work.  If you wish to have your labs drawn at another location, please call the office 24 hours in advance to send orders.  If you have any questions regarding directions or hours of operation,  please call [phone].   As a reminder, please drink plenty of water prior to coming for your lab work. Thanks!   Leflunomide tablets What is this medicine? LEFLUNOMIDE (le FLOO na mide) is for rheumatoid arthritis. This medicine may be used for other purposes; ask your health care provider or pharmacist if you have questions. COMMON BRAND NAME(S): Arava What should I tell my health care provider before I take this medicine? They need to know if you have any of these conditions:  diabetes  have a fever or infection  high blood pressure  immune system problems  kidney disease  liver disease  low blood cell counts, like low white cell, platelet, or red cell counts  lung or breathing disease, like asthma  recently received or scheduled to  receive a vaccine  receiving treatment for cancer  skin conditions or sensitivity  tingling of the fingers or toes, or other nerve disorder  tuberculosis  an unusual or allergic reaction to leflunomide, teriflunomide, other medicines, food, dyes, or preservatives  pregnant or trying to get pregnant  breast-feeding How should I use this medicine? Take this medicine by mouth with a full glass of water. Follow the directions on the prescription label. Take your medicine at regular intervals. Do not take your medicine more often than directed. Do not stop taking except on your doctor's advice. Talk to your pediatrician regarding the use of this medicine in children. Special care may be needed. Overdosage: If you think you have taken too much of this medicine contact a poison control center or emergency room at once. NOTE: This medicine is only for you. Do not share this medicine with others. What if I miss a dose? If you miss a dose, take it as soon as you can. If it is almost time for your next dose, take only that dose. Do not take double or extra doses. What may interact with this medicine? Do not take this medicine with any of the following medications:  teriflunomide This medicine may also interact with the following medications:  alosetron  birth control pills  caffeine  cefaclor  certain medicines for diabetes like nateglinide, repaglinide, rosiglitazone, pioglitazone  certain medicines for high cholesterol like atorvastatin, pravastatin, rosuvastatin, simvastatin  charcoal  cholestyramine  ciprofloxacin  duloxetine  furosemide  ketoprofen  live virus vaccines  medicines that increase your risk for infection  methotrexate  mitoxantrone  paclitaxel  penicillin  theophylline  tizanidine  warfarin This list may not describe all possible interactions. Give your health care provider a list of all the medicines, herbs, non-prescription drugs, or  dietary supplements you use. Also tell them if you smoke, drink alcohol, or use illegal drugs. Some items may interact with your medicine. What should I watch for while using this medicine? Visit your health care provider for regular checks on your progress. Tell your doctor or health care provider if your symptoms do not start to get better or if they get worse. You may need blood work done while you are taking this medicine. This medicine may cause serious skin reactions. They can happen weeks to months after starting the medicine. Contact your health care provider right away if you notice fevers or flu-like symptoms with a rash. The rash may be red or purple and then turn into blisters or peeling of the skin. Or, you might notice a red rash with swelling of the face, lips or lymph nodes in your neck or under your arms. This medicine may stay in your body for up to 2 years after your last dose. Tell your doctor about any unusual side effects or symptoms. A medicine can be given to help lower your blood levels of this medicine more quickly. Women must use effective birth control with this medicine. There is a potential for serious side effects to an unborn child. Do not become pregnant while taking this medicine. Inform your doctor if you wish to become pregnant. This medicine remains in your blood after you stop taking it. You must continue using effective birth control until the blood levels have been checked and they are low enough. A medicine can be given to help lower your blood levels of this medicine more quickly. Immediately talk to your doctor if you think you may be pregnant. You may need a pregnancy test. Talk to your health care provider or pharmacist for more information. You should not receive certain vaccines during your treatment and for a certain time after your treatment with this medication ends. Talk to your health care provider for more information. What side effects may I notice from  receiving this medicine? Side effects that you should report to your doctor or health care professional as soon as possible:  allergic reactions like skin rash, itching or hives, swelling of the face, lips, or tongue  breathing problems  cough  increased blood pressure  low blood counts - this medicine may decrease the number of white blood cells and platelets. You may be at increased risk for infections and bleeding.  pain, tingling, numbness in the hands or feet  rash, fever, and swollen lymph nodes  redness, blistering, peeing or loosening of the skin, including inside the mouth  signs of decreased platelets or bleeding - bruising, pinpoint red spots on the skin, black, tarry stools, blood in urine  signs of infection - fever or chills, cough, sore throat, pain or trouble passing urine  signs and symptoms of liver injury like dark yellow or brown urine; general ill feeling or flu-like symptoms; light-colored stools; loss of appetite; nausea; right upper belly pain; unusually weak or tired; yellowing of the eyes or skin  trouble passing urine or change in the amount of urine  vomiting Side effects that usually do not require medical attention (report to your  doctor or health care professional if they continue or are bothersome):  diarrhea  hair thinning or loss  headache  nausea  tiredness This list may not describe all possible side effects. Call your doctor for medical advice about side effects. You may report side effects to FDA at 1-800-FDA-1088. Where should I keep my medicine? Keep out of the reach of children. Store at room temperature between 15 and 30 degrees C (59 and 86 degrees F). Protect from moisture and light. Throw away any unused medicine after the expiration date. NOTE: This sheet is a summary. It may not cover all possible information. If you have questions about this medicine, talk to your doctor, pharmacist, or health care provider.  2020  Elsevier/Gold Standard (2018-06-20 15:06:48)

## 2019-10-17 LAB — COMPLETE METABOLIC PANEL WITH GFR
AG Ratio: 1.3 (calc) (ref 1.0–2.5)
ALT: 14 U/L (ref 9–46)
AST: 15 U/L (ref 10–35)
Albumin: 4.1 g/dL (ref 3.6–5.1)
Alkaline phosphatase (APISO): 114 U/L (ref 35–144)
BUN/Creatinine Ratio: 20 (calc) (ref 6–22)
BUN: 25 mg/dL (ref 7–25)
CO2: 21 mmol/L (ref 20–32)
Calcium: 9.8 mg/dL (ref 8.6–10.3)
Chloride: 109 mmol/L (ref 98–110)
Creat: 1.28 mg/dL — ABNORMAL HIGH (ref 0.70–1.18)
GFR, Est African American: 64 mL/min/{1.73_m2} (ref >=60)
GFR, Est Non African American: 55 mL/min/{1.73_m2} — ABNORMAL LOW (ref >=60)
Globulin: 3.2 g/dL (calc) (ref 1.9–3.7)
Glucose, Bld: 149 mg/dL — ABNORMAL HIGH (ref 65–99)
Potassium: 4.2 mmol/L (ref 3.5–5.3)
Sodium: 143 mmol/L (ref 135–146)
Total Bilirubin: 0.9 mg/dL (ref 0.2–1.2)
Total Protein: 7.3 g/dL (ref 6.1–8.1)

## 2019-10-17 LAB — CBC WITH DIFFERENTIAL/PLATELET
Absolute Monocytes: 418 cells/uL (ref 200–950)
Basophils Absolute: 61 cells/uL (ref 0–200)
Basophils Relative: 0.8 %
Eosinophils Absolute: 319 cells/uL (ref 15–500)
Eosinophils Relative: 4.2 %
HCT: 33.4 % — ABNORMAL LOW (ref 38.5–50.0)
Hemoglobin: 11.1 g/dL — ABNORMAL LOW (ref 13.2–17.1)
Lymphs Abs: 1284 cells/uL (ref 850–3900)
MCH: 31.3 pg (ref 27.0–33.0)
MCHC: 33.2 g/dL (ref 32.0–36.0)
MCV: 94.1 fL (ref 80.0–100.0)
MPV: 10.6 fL (ref 7.5–12.5)
Monocytes Relative: 5.5 %
Neutro Abs: 5518 cells/uL (ref 1500–7800)
Neutrophils Relative %: 72.6 %
Platelets: 233 10*3/uL (ref 140–400)
RBC: 3.55 10*6/uL — ABNORMAL LOW (ref 4.20–5.80)
RDW: 13.3 % (ref 11.0–15.0)
Total Lymphocyte: 16.9 %
WBC: 7.6 10*3/uL (ref 3.8–10.8)

## 2019-10-17 NOTE — Progress Notes (Signed)
Anemia noted which is a stable.  Glucose is elevated.  GFR is low, most likely due to the use of diuretics.

## 2019-10-19 MED ORDER — LEFLUNOMIDE 10 MG PO TABS
ORAL_TABLET | ORAL | 0 refills | Status: DC
Start: 2019-10-19 — End: 2020-02-09

## 2019-10-19 NOTE — Telephone Encounter (Signed)
Labs resulted: Anemia noted which is a stable. Glucose is elevated. GFR is low, most likely due to the use of diuretics.   Per office note on 10/16/2019: leflunomide 10 mg p.o. daily for 2 weeks if his labs are normal then he will increase it to 20 mg p.o. daily.

## 2019-10-22 ENCOUNTER — Ambulatory Visit (INDEPENDENT_AMBULATORY_CARE_PROVIDER_SITE_OTHER): Payer: Medicare Other

## 2019-10-22 ENCOUNTER — Other Ambulatory Visit: Payer: Self-pay

## 2019-10-22 DIAGNOSIS — E538 Deficiency of other specified B group vitamins: Secondary | ICD-10-CM | POA: Diagnosis not present

## 2019-10-22 MED ORDER — CYANOCOBALAMIN 1000 MCG/ML IJ SOLN
1000.0000 ug | Freq: Once | INTRAMUSCULAR | Status: AC
  Administered 2019-10-22: 1000 ug via INTRAMUSCULAR

## 2019-10-22 NOTE — Progress Notes (Signed)
Pt received monthly B12 injection, per Dr Josefine Class orders, in left deltoid. Pt tolerated well.

## 2019-10-26 ENCOUNTER — Ambulatory Visit (INDEPENDENT_AMBULATORY_CARE_PROVIDER_SITE_OTHER): Payer: Medicare Other | Admitting: Family Medicine

## 2019-10-26 ENCOUNTER — Encounter: Payer: Self-pay | Admitting: Family Medicine

## 2019-10-26 ENCOUNTER — Other Ambulatory Visit: Payer: Self-pay

## 2019-10-26 VITALS — BP 150/76 | HR 88 | Temp 97.1°F | Ht 69.5 in | Wt 289.1 lb

## 2019-10-26 DIAGNOSIS — R42 Dizziness and giddiness: Secondary | ICD-10-CM

## 2019-10-26 DIAGNOSIS — H8110 Benign paroxysmal vertigo, unspecified ear: Secondary | ICD-10-CM
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
## 2019-10-26 NOTE — Patient Instructions (Signed)
Likely BPV.  Use the bedside exercises and update me as needed.  Take care.  Glad to see you.  

## 2019-10-28 DIAGNOSIS — R42 Dizziness and giddiness: Secondary | ICD-10-CM | POA: Insufficient documentation

## 2019-10-28 NOTE — Assessment & Plan Note (Signed)
Likely BPV.  Anatomy and pathophysiology discussed with patient.  He can use the bedside exercises and update me as needed.  He agrees with plan.  Okay for outpatient follow-up.

## 2019-10-30 ENCOUNTER — Other Ambulatory Visit: Payer: Self-pay | Admitting: Rheumatology

## 2019-10-30 MED ORDER — PREDNISONE 5 MG PO TABS
ORAL_TABLET | ORAL | 0 refills | Status: DC
Start: 2019-10-30 — End: 2019-11-23

## 2019-10-30 NOTE — Telephone Encounter (Signed)
Please notify patient that you are sending him prescription.  Also advised him to monitor his blood sugar closely.

## 2019-10-30 NOTE — Telephone Encounter (Signed)
Patient advised it is okay for him to wait until Tuesday.  He is diabetic.  Patient states he can monitor his blood sugar closely. Patient advised Dr. Estanislado Pandy advised we can give him prednisone 15 mg p.o. daily for 2 days then taper by 5 mg every 2 days.
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
## 2019-10-30 NOTE — Telephone Encounter (Signed)
Patient called stating his left ankle is swollen, bruised, and painful.  Patient states he has had no injury and scheduled an appointment for Tuesday, 11/03/19 at 9:30 am.  Patient requested a return call from the nurse to let him know it was okay to wait until Tuesday or if he needs to call his PCP.

## 2019-10-30 NOTE — Telephone Encounter (Signed)
He was having a flare of psoriatic arthritis.  It is okay for him to wait until Tuesday.  He is diabetic.  If he can monitor his blood sugar closely we can give him prednisone 15 mg p.o. daily for 2 days then taper by 5 mg every 2 days.

## 2019-10-30 NOTE — Telephone Encounter (Signed)
Patient aware to keep a close eye on his blood sugars while on prednisone.

## 2019-11-03 ENCOUNTER — Encounter: Payer: Self-pay | Admitting: Rheumatology

## 2019-11-03 ENCOUNTER — Ambulatory Visit (INDEPENDENT_AMBULATORY_CARE_PROVIDER_SITE_OTHER): Payer: Medicare Other | Admitting: Rheumatology

## 2019-11-03 ENCOUNTER — Other Ambulatory Visit: Payer: Self-pay

## 2019-11-03 VITALS — BP 128/84 | HR 87 | Resp 16 | Ht 69.5 in | Wt 283.0 lb

## 2019-11-03 DIAGNOSIS — Z87448 Personal history of other diseases of urinary system: Secondary | ICD-10-CM | POA: Diagnosis not present

## 2019-11-03 DIAGNOSIS — G2581 Restless legs syndrome: Secondary | ICD-10-CM | POA: Diagnosis not present

## 2019-11-03 DIAGNOSIS — Z8669 Personal history of other diseases of the nervous system and sense organs: Secondary | ICD-10-CM

## 2019-11-03 DIAGNOSIS — M7662 Achilles tendinitis, left leg: Secondary | ICD-10-CM

## 2019-11-03 DIAGNOSIS — M19042 Primary osteoarthritis, left hand: Secondary | ICD-10-CM

## 2019-11-03 DIAGNOSIS — Z8639 Personal history of other endocrine, nutritional and metabolic disease: Secondary | ICD-10-CM

## 2019-11-03 DIAGNOSIS — Z8679 Personal history of other diseases of the circulatory system: Secondary | ICD-10-CM

## 2019-11-03 DIAGNOSIS — I472 Ventricular tachycardia, unspecified: Secondary | ICD-10-CM

## 2019-11-03 DIAGNOSIS — Z96653 Presence of artificial knee joint, bilateral: Secondary | ICD-10-CM

## 2019-11-03 DIAGNOSIS — M19072 Primary osteoarthritis, left ankle and foot: Secondary | ICD-10-CM

## 2019-11-03 DIAGNOSIS — Z79899 Other long term (current) drug therapy: Secondary | ICD-10-CM | POA: Diagnosis not present

## 2019-11-03 DIAGNOSIS — L409 Psoriasis, unspecified: Secondary | ICD-10-CM

## 2019-11-03 DIAGNOSIS — M19041 Primary osteoarthritis, right hand: Secondary | ICD-10-CM

## 2019-11-03 DIAGNOSIS — L4050 Arthropathic psoriasis, unspecified: Secondary | ICD-10-CM

## 2019-11-03 DIAGNOSIS — M19071 Primary osteoarthritis, right ankle and foot: Secondary | ICD-10-CM | POA: Diagnosis not present

## 2019-11-03 DIAGNOSIS — M25532 Pain in left wrist: Secondary | ICD-10-CM | POA: Diagnosis not present
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
## 2019-11-03 NOTE — Patient Instructions (Addendum)
Standing Labs We placed an order today for your standing lab work.   Please have your standing labs drawn in 2 weeks x 2 after leflunomide dose then November and every 3 months  If possible, please have your labs drawn 2 weeks prior to your appointment so that the provider can discuss your results at your appointment.  We have open lab daily Monday through Thursday from 8:30-12:30 PM and 1:30-4:30 PM and Friday from 8:30-12:30 PM and 1:30-4:00 PM at the office of Dr. Bo Merino, [facility].   Please be advised, patients with office appointments requiring lab work will take precedents over walk-in lab work.  If possible, please come for your lab work on Monday and Friday afternoons, as you may experience shorter wait times. The office is located at [address] [place], [place], [place] [address] No appointment is necessary.   Labs are drawn by [facility]. Please bring your co-pay at the time of your lab draw.  You may receive a bill from [facility] for your lab work.  If you wish to have your labs drawn at another location, please call the office 24 hours in advance to send orders.  COVID-19 vaccine recommendations:   COVID-19 vaccine is recommended for everyone (unless you are allergic to a vaccine component), even if you are on a medication that suppresses your immune system.   If you are on Methotrexate, Cellcept (mycophenolate), Rinvoq, Morrie Sheldon, and Olumiant- hold the medication for 1 week after each vaccine. Hold Methotrexate for 2 weeks after the single dose COVID-19 vaccine.   If you are on Orencia subcutaneous injection - hold medication one week prior to and one week after the first COVID-19 vaccine dose (only).   If you are on Orencia IV infusions- time vaccination administration so that the first COVID-19 vaccination will occur four weeks after the infusion and postpone the subsequent infusion by one week.   If you are on Cyclophosphamide or Rituxan infusions  please contact your doctor prior to receiving the COVID-19 vaccine.   Do not take Tylenol or ant anti-inflammatory medications (NSAIDs) 24 hours prior to the COVID-19 vaccination.   There is no direct evidence about the efficacy of the COVID-19 vaccine in individuals who are on medications that suppress the immune system.   Even if you are fully vaccinated, and you are on any medications that suppress your immune system, please continue to wear a mask, maintain at least six feet social distance and practice hand hygiene.   If you develop a COVID-19 infection, please contact your PCP or our office to determine if you need antibody infusion.  We anticipate that a booster vaccine will be available soon for immunosuppressed individuals. Please cal our office before receiving your booster dose to make adjustments to your medication regimen.   If you have any questions regarding directions or hours of operation,  please call [phone].   As a reminder, please drink plenty of water prior to coming for your lab work. Thanks!

## 2019-11-04 ENCOUNTER — Other Ambulatory Visit: Payer: Self-pay | Admitting: Rheumatology

## 2019-11-06 DIAGNOSIS — I10 Essential (primary) hypertension: Secondary | ICD-10-CM | POA: Diagnosis not present

## 2019-11-06 DIAGNOSIS — I472 Ventricular tachycardia: Secondary | ICD-10-CM | POA: Diagnosis not present

## 2019-11-06 DIAGNOSIS — I2510 Atherosclerotic heart disease of native coronary artery without angina pectoris: Secondary | ICD-10-CM | POA: Diagnosis not present

## 2019-11-10 ENCOUNTER — Telehealth: Payer: Self-pay | Admitting: Rheumatology

## 2019-11-10 DIAGNOSIS — Z79899 Other long term (current) drug therapy: Secondary | ICD-10-CM

## 2019-11-10 NOTE — Telephone Encounter (Signed)
Lab Orders released.  

## 2019-11-10 NOTE — Telephone Encounter (Signed)
Patient called requesting his labwork orders be sent to Luis M. Cintron at [facility] in [place]. Patient states their phone [phone]  Fax [phone]  Patient states he will be going tomorrow morning 10/11/19.

## 2019-11-11 DIAGNOSIS — Z79899 Other long term (current) drug therapy: Secondary | ICD-10-CM | POA: Diagnosis not present

## 2019-11-12 LAB — CMP14+EGFR
ALT: 17 IU/L (ref 0–44)
AST: 11 IU/L (ref 0–40)
Albumin/Globulin Ratio: 1.3 (ref 1.2–2.2)
Albumin: 4 g/dL (ref 3.7–4.7)
Alkaline Phosphatase: 134 IU/L — ABNORMAL HIGH (ref 48–121)
BUN/Creatinine Ratio: 20 (ref 10–24)
BUN: 22 mg/dL (ref 8–27)
Bilirubin Total: 0.6 mg/dL (ref 0.0–1.2)
CO2: 20 mmol/L (ref 20–29)
Calcium: 9.4 mg/dL (ref 8.6–10.2)
Chloride: 103 mmol/L (ref 96–106)
Creatinine, Ser: 1.12 mg/dL (ref 0.76–1.27)
GFR calc Af Amer: 75 mL/min/{1.73_m2} (ref >59)
GFR calc non Af Amer: 65 mL/min/{1.73_m2} (ref >59)
Globulin, Total: 3.2 g/dL (ref 1.5–4.5)
Glucose: 168 mg/dL — ABNORMAL HIGH (ref 65–99)
Potassium: 4.3 mmol/L (ref 3.5–5.2)
Sodium: 140 mmol/L (ref 134–144)
Total Protein: 7.2 g/dL (ref 6.0–8.5)

## 2019-11-12 LAB — CBC WITH DIFFERENTIAL/PLATELET
Basophils Absolute: 0.1 10*3/uL (ref 0.0–0.2)
Basos: 1 %
EOS (ABSOLUTE): 0.4 10*3/uL (ref 0.0–0.4)
Eos: 3 %
Hematocrit: 34.6 % — ABNORMAL LOW (ref 37.5–51.0)
Hemoglobin: 11.9 g/dL — ABNORMAL LOW (ref 13.0–17.7)
Immature Grans (Abs): 0 10*3/uL (ref 0.0–0.1)
Immature Granulocytes: 0 %
Lymphocytes Absolute: 1.5 10*3/uL (ref 0.7–3.1)
Lymphs: 13 %
MCH: 31.9 pg (ref 26.6–33.0)
MCHC: 34.4 g/dL (ref 31.5–35.7)
MCV: 93 fL (ref 79–97)
Monocytes Absolute: 0.6 10*3/uL (ref 0.1–0.9)
Monocytes: 5 %
Neutrophils Absolute: 9.1 10*3/uL — ABNORMAL HIGH (ref 1.4–7.0)
Neutrophils: 78 %
Platelets: 247 10*3/uL (ref 150–450)
RBC: 3.73 x10E6/uL — ABNORMAL LOW (ref 4.14–5.80)
RDW: 12.9 % (ref 11.6–15.4)
WBC: 11.6 10*3/uL — ABNORMAL HIGH (ref 3.4–10.8)

## 2019-11-13 NOTE — Telephone Encounter (Signed)
[name] had prednisone taper which increased WBC count.  Glucose is elevated and he is diabetic.  Alkaline phosphatase is mildly elevated.  We will continue to monitor.

## 2019-11-23 ENCOUNTER — Other Ambulatory Visit: Payer: Self-pay | Admitting: Pulmonary Disease

## 2019-11-23 ENCOUNTER — Other Ambulatory Visit: Payer: Self-pay

## 2019-11-23 ENCOUNTER — Encounter: Payer: Self-pay | Admitting: Pulmonary Disease

## 2019-11-23 ENCOUNTER — Ambulatory Visit (INDEPENDENT_AMBULATORY_CARE_PROVIDER_SITE_OTHER): Payer: Medicare Other | Admitting: Pulmonary Disease

## 2019-11-23 DIAGNOSIS — G2581 Restless legs syndrome: Secondary | ICD-10-CM | POA: Diagnosis not present

## 2019-11-23 DIAGNOSIS — G4733 Obstructive sleep apnea (adult) (pediatric): Secondary | ICD-10-CM | POA: Diagnosis not present

## 2019-11-23 MED ORDER — ROPINIROLE HCL 0.5 MG PO TABS: 1.0000 mg | ORAL_TABLET | Freq: Every day | ORAL | 5 refills | Status: DC

## 2019-11-23 NOTE — Assessment & Plan Note (Signed)
Symptoms are well controlled on Requip. We will continue 1 mg 2 hours prior to bedtime

## 2019-11-23 NOTE — Assessment & Plan Note (Signed)
BiPAP is working well at current settings 11/7. He is very compliant and events are well controlled. This is only helped improve his daytime somnolence and fatigue  Weight loss encouraged, compliance with goal of at least 4-6 hrs every night is the expectation. Advised against medications with sedative side effects Cautioned against driving when sleepy - understanding that sleepiness will vary on a day to day basis

## 2019-11-23 NOTE — Patient Instructions (Signed)
  Refills on Requip 1 mg at bedtime BiPAP is working well on current settings

## 2019-11-23 NOTE — Progress Notes (Signed)
   Subjective:    Patient ID: [name], male    DOB: 07/11/1945, 74 y.o.   MRN: [id number]  HPI 74 yo man for follow-up of  OSA and RLS  He could not tolerate  CPAP, hence has been maintained on BiPAP 11/7 since 2015 with excellent results   PMH - AF on  Apixaban, status post ablation. Rheumatoid arthritis on [place]   Annual follow-up, ambulates with a cane due to arthritis. Restless leg symptoms are well controlled on Requip. He puts on his BiPAP when he gets into bed to watch TV, has not missed a single night, confirmed on download which shows excellent compliance, good control of events and no leak on 11/7 cm.  No problems with mask or pressure, minimal dryness Getting supplies on time from DME.   Significant tests/ events reviewed  NPSG 02/2012: AHI 91/hr.  Auto 2014: Optimal pressure 15cm Poor CPAP tolerance Bilevel titration study 05/2013: 11/7cm, +++limb movements with 7/hr with arousal or awakening.  PFT  08/07/16:FVC 3.27 L (75%) FEV1 2.82 L (88%) FEV1/FVC 0.86  negative bronchodilator response TLC 5.36 L (77%) DLCO uncorrected 53%  10/02/11: FVC 3.53 L (76%) FEV1 2.96 L (86%) FEV1/FVC 0.84 FEF 25-75 3.38L (124%) negative bronchodilator response TLC 6.73 L (95%) RV 120% DLCO uncorrected 64%   Review of Systems Patient denies significant dyspnea,cough, hemoptysis,  chest pain, palpitations, pedal edema, orthopnea, paroxysmal nocturnal dyspnea, lightheadedness, nausea, vomiting, abdominal or  leg pains      Objective:   Physical Exam  Gen. Pleasant, obese,elderly, in no distress ENT - no lesions, no post nasal drip Neck: No JVD, no thyromegaly, no carotid bruits Lungs: no use of accessory muscles, no dullness to percussion, decreased without rales or rhonchi  Cardiovascular: Rhythm regular, heart sounds  normal, no murmurs or gallops, no peripheral edema Musculoskeletal: No deformities, no cyanosis or clubbing , no tremors, uses  cane        Assessment & Plan:

## 2019-11-24 ENCOUNTER — Ambulatory Visit (INDEPENDENT_AMBULATORY_CARE_PROVIDER_SITE_OTHER): Payer: Medicare Other

## 2019-11-24 ENCOUNTER — Other Ambulatory Visit: Payer: Self-pay

## 2019-11-24 DIAGNOSIS — E538 Deficiency of other specified B group vitamins: Secondary | ICD-10-CM | POA: Diagnosis not present

## 2019-11-24 MED ORDER — CYANOCOBALAMIN 1000 MCG/ML IJ SOLN
1000.0000 ug | Freq: Once | INTRAMUSCULAR | Status: AC
  Administered 2019-11-24: 1000 ug via INTRAMUSCULAR

## 2019-11-24 NOTE — Progress Notes (Signed)
Per orders of Dr. [name], injection of B12 given by Randall An. Patient tolerated injection well.
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
## 2019-12-15 ENCOUNTER — Other Ambulatory Visit: Payer: Self-pay | Admitting: Psychiatry

## 2019-12-15 NOTE — Telephone Encounter (Signed)
Pt. Has not been seen since 03/2019.

## 2019-12-15 NOTE — Telephone Encounter (Signed)
Review.

## 2019-12-21 ENCOUNTER — Ambulatory Visit: Payer: Medicare Other | Admitting: Rheumatology

## 2019-12-22 ENCOUNTER — Other Ambulatory Visit: Payer: Self-pay | Admitting: Family Medicine

## 2019-12-22 NOTE — Telephone Encounter (Signed)
Electronic refill request. Pregabalin Last office visit:   10/26/2019 Last Filled:    360 capsule 1 06/22/2019

## 2019-12-23 NOTE — Telephone Encounter (Signed)
Sent. Thanks.   

## 2019-12-28 LAB — HM DIABETES EYE EXAM

## 2019-12-29 ENCOUNTER — Other Ambulatory Visit: Payer: Self-pay

## 2019-12-29 ENCOUNTER — Ambulatory Visit (INDEPENDENT_AMBULATORY_CARE_PROVIDER_SITE_OTHER): Payer: Medicare Other | Admitting: *Deleted

## 2019-12-29 DIAGNOSIS — E538 Deficiency of other specified B group vitamins: Secondary | ICD-10-CM

## 2019-12-29 DIAGNOSIS — D3131 Benign neoplasm of right choroid: Secondary | ICD-10-CM | POA: Diagnosis not present

## 2019-12-29 DIAGNOSIS — D3130 Benign neoplasm of unspecified choroid: Secondary | ICD-10-CM | POA: Diagnosis not present

## 2019-12-29 MED ORDER — CYANOCOBALAMIN 1000 MCG/ML IJ SOLN
1000.0000 ug | Freq: Once | INTRAMUSCULAR | Status: AC
  Administered 2019-12-29: 1000 ug via INTRAMUSCULAR

## 2019-12-29 NOTE — Progress Notes (Signed)
Per orders of Dr. Duncan, injection of B12 given by Maicee Ullman M. Patient tolerated injection well. 

## 2020-01-04 DIAGNOSIS — Z23 Encounter for immunization: Secondary | ICD-10-CM | POA: Diagnosis not present

## 2020-01-07 ENCOUNTER — Encounter: Payer: Self-pay | Admitting: Family Medicine

## 2020-01-11 ENCOUNTER — Ambulatory Visit: Payer: Medicare Other | Admitting: Family Medicine
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
## 2020-01-17 ENCOUNTER — Other Ambulatory Visit: Payer: Self-pay | Admitting: Family Medicine

## 2020-01-18 ENCOUNTER — Ambulatory Visit: Payer: Medicare Other | Admitting: Family Medicine

## 2020-01-26 ENCOUNTER — Ambulatory Visit: Payer: Medicare Other | Admitting: Family Medicine

## 2020-01-28 ENCOUNTER — Ambulatory Visit: Payer: Medicare Other | Admitting: Rheumatology

## 2020-01-28 DIAGNOSIS — Z8679 Personal history of other diseases of the circulatory system: Secondary | ICD-10-CM

## 2020-01-28 DIAGNOSIS — Z79899 Other long term (current) drug therapy: Secondary | ICD-10-CM

## 2020-01-28 DIAGNOSIS — L409 Psoriasis, unspecified: Secondary | ICD-10-CM

## 2020-01-28 DIAGNOSIS — I472 Ventricular tachycardia: Secondary | ICD-10-CM

## 2020-01-28 DIAGNOSIS — M19071 Primary osteoarthritis, right ankle and foot: Secondary | ICD-10-CM

## 2020-01-28 DIAGNOSIS — L4050 Arthropathic psoriasis, unspecified: Secondary | ICD-10-CM

## 2020-01-28 DIAGNOSIS — Z96653 Presence of artificial knee joint, bilateral: Secondary | ICD-10-CM

## 2020-01-28 DIAGNOSIS — M25532 Pain in left wrist: Secondary | ICD-10-CM

## 2020-01-28 DIAGNOSIS — Z8669 Personal history of other diseases of the nervous system and sense organs: Secondary | ICD-10-CM

## 2020-01-28 DIAGNOSIS — Z8639 Personal history of other endocrine, nutritional and metabolic disease: Secondary | ICD-10-CM

## 2020-01-28 DIAGNOSIS — M7662 Achilles tendinitis, left leg: Secondary | ICD-10-CM

## 2020-01-28 DIAGNOSIS — Z87448 Personal history of other diseases of urinary system: Secondary | ICD-10-CM

## 2020-01-28 DIAGNOSIS — M19042 Primary osteoarthritis, left hand: Secondary | ICD-10-CM

## 2020-01-28 DIAGNOSIS — G2581 Restless legs syndrome: Secondary | ICD-10-CM

## 2020-02-09 ENCOUNTER — Other Ambulatory Visit: Payer: Self-pay

## 2020-02-09 ENCOUNTER — Encounter: Payer: Self-pay | Admitting: Family Medicine

## 2020-02-09 ENCOUNTER — Ambulatory Visit (INDEPENDENT_AMBULATORY_CARE_PROVIDER_SITE_OTHER): Payer: Medicare Other | Admitting: Family Medicine

## 2020-02-09 VITALS — BP 114/68 | HR 71 | Temp 97.7°F | Ht 69.25 in | Wt 275.1 lb

## 2020-02-09 DIAGNOSIS — E1149 Type 2 diabetes mellitus with other diabetic neurological complication: Secondary | ICD-10-CM

## 2020-02-09 DIAGNOSIS — Z23 Encounter for immunization: Secondary | ICD-10-CM

## 2020-02-09 DIAGNOSIS — E538 Deficiency of other specified B group vitamins: Secondary | ICD-10-CM

## 2020-02-09 DIAGNOSIS — M129 Arthropathy, unspecified: Secondary | ICD-10-CM

## 2020-02-09 DIAGNOSIS — I10 Essential (primary) hypertension: Secondary | ICD-10-CM

## 2020-02-09 LAB — POCT GLYCOSYLATED HEMOGLOBIN (HGB A1C): Hemoglobin A1C: 6.4 % — AB (ref 4.0–5.6)

## 2020-02-09 MED ORDER — CYANOCOBALAMIN 1000 MCG/ML IJ SOLN
1000.0000 ug | Freq: Once | INTRAMUSCULAR | Status: AC
  Administered 2020-02-09: 1000 ug via INTRAMUSCULAR

## 2020-02-09 MED ORDER — PREGABALIN 50 MG PO CAPS
50.0000 mg | ORAL_CAPSULE | Freq: Two times a day (BID) | ORAL | 1 refills | Status: DC
Start: 2020-02-09 — End: 2020-08-18

## 2020-02-09 MED ORDER — METFORMIN HCL ER 750 MG PO TB24
750.0000 mg | ORAL_TABLET | Freq: Two times a day (BID) | ORAL | 3 refills | Status: DC
Start: 2020-02-09 — End: 2021-04-04

## 2020-02-09 MED ORDER — GLIPIZIDE 5 MG PO TABS
5.0000 mg | ORAL_TABLET | Freq: Every day | ORAL | 3 refills | Status: DC
Start: 2020-02-09 — End: 2021-01-01

## 2020-02-09 MED ORDER — LISINOPRIL 2.5 MG PO TABS: 2.5000 mg | ORAL_TABLET | Freq: Every evening | ORAL | 3 refills | Status: DC

## 2020-02-09 MED ORDER — PRAVASTATIN SODIUM 20 MG PO TABS
20.0000 mg | ORAL_TABLET | Freq: Every evening | ORAL | 3 refills | Status: DC
Start: 2020-02-09 — End: 2020-05-26

## 2020-02-09 NOTE — Progress Notes (Signed)
This visit occurred during the SARS-CoV-2 public health emergency.  Safety protocols were in place, including screening questions prior to the visit, additional usage of staff PPE, and extensive cleaning of exam room while observing appropriate contact time as indicated for disinfecting solutions.  Diabetes:  Using medications without difficulties: yes Hypoglycemic episodes: no Hyperglycemic episodes:no Feet problems: clearly better with 50mg  BID lyrica.   Blood Sugars averaging: see below.  eye exam within last year: yes.  A1c done at office visit.  Discussed with patient.  B12 due, d/w pt. done at office visit.  Flu shot done at office visit.  He didn't tolerate Arava and stopped in the meantime.  He has rheum f/u pending.  He had prednisone taper, done with that now.  Sugar 160-180 after prednisone.  Prev was 140-160.  He has been off prednisone for >1 month.    His brother recently died of Parkinson's.  Condolences offered.  Meds, vitals, and allergies reviewed.   ROS: Per HPI unless specifically indicated in ROS section   GEN: nad, alert and oriented HEENT: ncat NECK: supple w/o LA CV: rrr. PULM: ctab, no inc wob ABD: soft, +bs EXT: no edema SKIN: well perfused.    Diabetic foot exam: Normal inspection No skin breakdown No calluses  Normal DP pulses Normal sensation to light touch and monofilament Nails normal

## 2020-02-09 NOTE — Patient Instructions (Addendum)
Note the strength change on lyrica.  No change in total daily dose.  Take care.  Glad to see you. B12 and flu shot today.   Get through the rheumatology appointment.  Plan on recheck here in about 3-4 months with labs ahead of time of possible for a yearly visit.
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
## 2020-02-10 NOTE — Assessment & Plan Note (Signed)
We can consolidate his Lyrica dosing to 50 mg tablet twice a day.  That would be fewer pills for the patient but the same total daily dose.  He agrees.  Recheck A1c periodically.  See after visit summary.  A1c is still reasonable.  Foot care discussed with patient.  Continue glipizide and Metformin as is.

## 2020-02-10 NOTE — Assessment & Plan Note (Signed)
Injection done today.  Continue with replacement as is.

## 2020-02-10 NOTE — Assessment & Plan Note (Signed)
He didn't tolerate Arava and stopped in the meantime.  He has rheum f/u pending.  He had prednisone taper, done with that now.  Sugar 160-180 after prednisone.  Prev was 140-160.  He has been off prednisone for >1 month.  I will defer to rheumatology.  He agrees.

## 2020-02-11 ENCOUNTER — Telehealth: Payer: Self-pay | Admitting: Pharmacist

## 2020-02-11 ENCOUNTER — Other Ambulatory Visit: Payer: Self-pay

## 2020-02-11 ENCOUNTER — Ambulatory Visit (INDEPENDENT_AMBULATORY_CARE_PROVIDER_SITE_OTHER): Payer: Medicare Other | Admitting: Rheumatology

## 2020-02-11 ENCOUNTER — Encounter: Payer: Self-pay | Admitting: Rheumatology

## 2020-02-11 VITALS — BP 138/74 | HR 80 | Resp 17 | Ht 69.5 in | Wt 276.0 lb

## 2020-02-11 DIAGNOSIS — Z8669 Personal history of other diseases of the nervous system and sense organs: Secondary | ICD-10-CM

## 2020-02-11 DIAGNOSIS — G2581 Restless legs syndrome: Secondary | ICD-10-CM | POA: Diagnosis not present

## 2020-02-11 DIAGNOSIS — Z87448 Personal history of other diseases of urinary system: Secondary | ICD-10-CM

## 2020-02-11 DIAGNOSIS — Z8679 Personal history of other diseases of the circulatory system: Secondary | ICD-10-CM

## 2020-02-11 DIAGNOSIS — Z79899 Other long term (current) drug therapy: Secondary | ICD-10-CM

## 2020-02-11 DIAGNOSIS — M19041 Primary osteoarthritis, right hand: Secondary | ICD-10-CM

## 2020-02-11 DIAGNOSIS — L4050 Arthropathic psoriasis, unspecified: Secondary | ICD-10-CM | POA: Diagnosis not present

## 2020-02-11 DIAGNOSIS — I472 Ventricular tachycardia, unspecified: Secondary | ICD-10-CM

## 2020-02-11 DIAGNOSIS — M19042 Primary osteoarthritis, left hand: Secondary | ICD-10-CM

## 2020-02-11 DIAGNOSIS — M19071 Primary osteoarthritis, right ankle and foot: Secondary | ICD-10-CM

## 2020-02-11 DIAGNOSIS — M7662 Achilles tendinitis, left leg: Secondary | ICD-10-CM | POA: Diagnosis not present

## 2020-02-11 DIAGNOSIS — L409 Psoriasis, unspecified: Secondary | ICD-10-CM | POA: Diagnosis not present

## 2020-02-11 DIAGNOSIS — M25532 Pain in left wrist: Secondary | ICD-10-CM

## 2020-02-11 DIAGNOSIS — Z96653 Presence of artificial knee joint, bilateral: Secondary | ICD-10-CM | POA: Diagnosis not present

## 2020-02-11 DIAGNOSIS — M19072 Primary osteoarthritis, left ankle and foot: Secondary | ICD-10-CM

## 2020-02-11 DIAGNOSIS — Z8639 Personal history of other endocrine, nutritional and metabolic disease: Secondary | ICD-10-CM

## 2020-02-11 NOTE — Patient Instructions (Signed)
Guselkumab injection What is this medicine? GUSELKUMAB (goo ZELK ue mab) is used to treat plaque psoriasis and psoriatic arthritis. This medicine may be used for other purposes; ask your health care provider or pharmacist if you have questions. COMMON BRAND NAME(S): Tremfya What should I tell my health care provider before I take this medicine? They need to know if you have any of these conditions:  immune system problems  infection (especially a virus infection such as chickenpox, cold sores, or herpes) or history of infections  recently received or scheduled to receive a vaccine  tuberculosis, a positive skin test for tuberculosis, or have recently been in close contact with someone who has tuberculosis  an unusual or allergic reaction to guselkumab, other medicines, foods, dyes, or preservatives  pregnant or trying to get pregnant  breast-feeding How should I use this medicine? This medicine is for injection under the skin. It may be administered by a healthcare professional in a hospital or clinic setting or at home. If you get this medicine at home, you will be taught how to prepare and give this medicine. Use exactly as directed. Take your medicine at regular intervals. Do not take your medicine more often than directed. It is important that you put your used injectors, needles and syringes in a special sharps container. Do not put them in a trash can. If you do not have a sharps container, call your pharmacist or healthcare provider to get one. A special MedGuide will be given to you by the pharmacist with each prescription and refill. Be sure to read this information carefully each time. Talk to your pediatrician regarding the use of this medicine in children. Special care may be needed. Overdosage: If you think you have taken too much of this medicine contact a poison control center or emergency room at once. NOTE: This medicine is only for you. Do not share this medicine with  others. What if I miss a dose? It is important not to miss your dose. Call your doctor of health care professional if you are unable to keep an appointment. If you give yourself the medicine and you miss a dose, take it as soon as you can. Then, take your next dose at your regular scheduled time. What may interact with this medicine? Do not take this medicine with any of the following medications:  live virus vaccines This medicine may also interact with the following medications:  amoxapine  certain medicines for depression, anxiety, or psychotic disturbances like amitriptyline, clomipramine, desipramine, doxepin, imipramine, maprotiline, nortriptyline, protriptyline, trimipramine  codeine  inactivated vaccines  methadone  pimozide  thioridazine This list may not describe all possible interactions. Give your health care provider a list of all the medicines, herbs, non-prescription drugs, or dietary supplements you use. Also tell them if you smoke, drink alcohol, or use illegal drugs. Some items may interact with your medicine. What should I watch for while using this medicine? Your condition will be monitored carefully while you are receiving this medicine. Tell your doctor or healthcare professional if your symptoms do not start to get better or if they get worse. You will be tested for tuberculosis (TB) before you start this medicine. If your doctor prescribes any medicine for TB, you should start taking the TB medicine before starting this medicine. Make sure to finish the full course of TB medicine. Call your doctor or health care professional if you get a cold or other infection while receiving this medicine. Do not treat yourself. This  medicine may decrease your body's ability to fight infection. What side effects may I notice from receiving this medicine? Side effects that you should report to your doctor or health care professional as soon as possible:  allergic reactions like  skin rash, itching or hives, swelling of the face, lips, or tongue  breathing problems  chest pain or chest tightness  dizziness, feeling faint or lightheaded  signs and symptoms of infection like fever or chills; cough; sore throat; pain or trouble passing urine Side effects that usually do not require medical attention (report these to your doctor or health care professional if they continue or are bothersome):  diarrhea  headache  joint pain  pain, redness, irritation at site where injected This list may not describe all possible side effects. Call your doctor for medical advice about side effects. You may report side effects to FDA at 1-800-FDA-1088. Where should I keep my medicine? Keep out of the reach of children. Store unopened syringes or injectors in a refrigerator between 2 to 8 degrees C (36 to 46 degrees F). Keep in the original container until ready for use. Protect from light. Do not freeze. Do not shake. Prior to use, remove the syringe or injector from the refrigerator and use after 30 minutes at room temperature. Throw away any unused medicine after the expiration date on the label. NOTE: This sheet is a summary. It may not cover all possible information. If you have questions about this medicine, talk to your doctor, pharmacist, or health care provider.  2020 Elsevier/Gold Standard (2018-10-15 15:10:10)

## 2020-02-11 NOTE — Telephone Encounter (Signed)
Patient had visit at rheumatology office today. Patient signed and completed Otezla renewal application (maintenance dose of Otezla 30mg  twice daily). Faxed application with prescription to [employer] [facility].  Faxed to [place] at [place] phone: [phone]  [name], PharmD, MPH Clinical Pharmacist (Rheumatology and Pulmonology)

## 2020-02-12 LAB — COMPLETE METABOLIC PANEL WITH GFR
AG Ratio: 1.3 (calc) (ref 1.0–2.5)
ALT: 12 U/L (ref 9–46)
AST: 13 U/L (ref 10–35)
Albumin: 3.9 g/dL (ref 3.6–5.1)
Alkaline phosphatase (APISO): 110 U/L (ref 35–144)
BUN: 19 mg/dL (ref 7–25)
CO2: 29 mmol/L (ref 20–32)
Calcium: 9.6 mg/dL (ref 8.6–10.3)
Chloride: 105 mmol/L (ref 98–110)
Creat: 1.1 mg/dL (ref 0.70–1.18)
GFR, Est African American: 77 mL/min/{1.73_m2} (ref >=60)
GFR, Est Non African American: 66 mL/min/{1.73_m2} (ref >=60)
Globulin: 2.9 g/dL (calc) (ref 1.9–3.7)
Glucose, Bld: 105 mg/dL — ABNORMAL HIGH (ref 65–99)
Potassium: 3.6 mmol/L (ref 3.5–5.3)
Sodium: 144 mmol/L (ref 135–146)
Total Bilirubin: 0.8 mg/dL (ref 0.2–1.2)
Total Protein: 6.8 g/dL (ref 6.1–8.1)

## 2020-02-12 LAB — CBC WITH DIFFERENTIAL/PLATELET
Absolute Monocytes: 704 cells/uL (ref 200–950)
Basophils Absolute: 63 cells/uL (ref 0–200)
Basophils Relative: 0.6 %
Eosinophils Absolute: 273 cells/uL (ref 15–500)
Eosinophils Relative: 2.6 %
HCT: 34.2 % — ABNORMAL LOW (ref 38.5–50.0)
Hemoglobin: 11.7 g/dL — ABNORMAL LOW (ref 13.2–17.1)
Lymphs Abs: 1596 cells/uL (ref 850–3900)
MCH: 30.8 pg (ref 27.0–33.0)
MCHC: 34.2 g/dL (ref 32.0–36.0)
MCV: 90 fL (ref 80.0–100.0)
MPV: 10.7 fL (ref 7.5–12.5)
Monocytes Relative: 6.7 %
Neutro Abs: 7865 cells/uL — ABNORMAL HIGH (ref 1500–7800)
Neutrophils Relative %: 74.9 %
Platelets: 274 10*3/uL (ref 140–400)
RBC: 3.8 10*6/uL — ABNORMAL LOW (ref 4.20–5.80)
RDW: 13.2 % (ref 11.0–15.0)
Total Lymphocyte: 15.2 %
WBC: 10.5 10*3/uL (ref 3.8–10.8)

## 2020-02-12 NOTE — Progress Notes (Signed)
Anemia is a stable.  CMP is normal.

## 2020-02-15 ENCOUNTER — Telehealth: Payer: Self-pay

## 2020-02-15 NOTE — Telephone Encounter (Signed)
Mardene Celeste from [employer] left a voicemail (on 02/12/20 at 7:50 pm) stating we received an application for patient's renewal of Otezla.   We are missing signatures off the signature pages (page 2 & 3 authorization and certification pages).  Please call back at [phone]

## 2020-02-15 NOTE — Telephone Encounter (Signed)
Re-faxed [employer].Entered dates that were missing on Pages 2 and 3.  Faxed to [facility] at [place] phone: [phone]  [name], PharmD, MPH Clinical Pharmacist (Rheumatology and Pulmonology)

## 2020-02-15 NOTE — Telephone Encounter (Signed)
Patient's signature pages were not dated. Pharmacist will date and refax.

## 2020-03-15 ENCOUNTER — Ambulatory Visit (INDEPENDENT_AMBULATORY_CARE_PROVIDER_SITE_OTHER): Payer: Medicare Other

## 2020-03-15 ENCOUNTER — Other Ambulatory Visit: Payer: Self-pay

## 2020-03-15 DIAGNOSIS — E538 Deficiency of other specified B group vitamins: Secondary | ICD-10-CM | POA: Diagnosis not present

## 2020-03-15 MED ORDER — CYANOCOBALAMIN 1000 MCG/ML IJ SOLN
1000.0000 ug | Freq: Once | INTRAMUSCULAR | Status: AC
  Administered 2020-03-15: 1000 ug via INTRAMUSCULAR

## 2020-03-15 NOTE — Progress Notes (Signed)
Per orders of Dr. [name], injection of B12, given by Aneta Mins, RN in [place] Delto[place]. Patient tolerated injection well.

## 2020-03-22 ENCOUNTER — Encounter: Payer: Self-pay | Admitting: Psychiatry

## 2020-03-22 ENCOUNTER — Ambulatory Visit (INDEPENDENT_AMBULATORY_CARE_PROVIDER_SITE_OTHER): Payer: Medicare Other | Admitting: Psychiatry

## 2020-03-22 ENCOUNTER — Other Ambulatory Visit: Payer: Self-pay

## 2020-03-22 DIAGNOSIS — F331 Major depressive disorder, recurrent, moderate: Secondary | ICD-10-CM | POA: Diagnosis not present

## 2020-03-22 DIAGNOSIS — F4310 Post-traumatic stress disorder, unspecified: Secondary | ICD-10-CM

## 2020-03-22 MED ORDER — FLUOXETINE HCL 20 MG PO CAPS: 40.0000 mg | ORAL_CAPSULE | Freq: Every day | ORAL | 0 refills | Status: DC

## 2020-03-22 NOTE — Progress Notes (Signed)
[name] [id number] 13-Dec-1945 74 y.o.    Subjective:   Patient ID:  [name] is a 74 y.o. (DOB 1946-02-07) male.  Chief Complaint:  Chief Complaint  Patient presents with  . Follow-up  . Depression    Depression        Associated symptoms include no decreased concentration and no suicidal ideas.  [name] presents to the office today for follow-up of depression and anxiety.  He has been under my care for many years, 22.  On Prozac all those years.    Last seen Dec 2020 without med changes and the folllowing noted: Served custody papers with F trying to get GD.  Left to live with her F in July.  Depressing time for me but handled it.  Had a supportive friends which helped.  Read [name]'s book which helped.  [place] with it.  She's 73 yo.  Still loves them and sees them.  03/22/20 appt with following noted: Last half of the year not good.  Early June lost dear friend unexpectedly.  Late 02/04/2023 only brother died.  Noticed more depression after brother's death.   Wonders about increased fluoxetine from 30 without SE.  Tended to lay in bed longer than he should.  Less motivation and energy and enjoyment. Mild irritability. 73 yo GD doing well in driving and school.    Patient denies any recent difficulty with anxiety.  Patient denies difficulty with sleep initiation or maintenance. Denies appetite disturbance.  Patient reports that energy and motivation have been good.  Patient denies any difficulty with concentration.  Patient denies any suicidal ideation.  Feels so much better after ablation for VT.   Marland KitchenPast Psychiatric Medication Trials: Zoloft, Trazodone, fluoxetine 30  Review of Systems:  Review of Systems  Cardiovascular: Negative for palpitations.  Musculoskeletal: Positive for arthralgias and gait problem.  Neurological: Positive for numbness. Negative for tremors and weakness.  Psychiatric/Behavioral: Positive for depression and dysphoric mood. Negative for  agitation, behavioral problems, confusion, decreased concentration, hallucinations, self-injury, sleep disturbance and suicidal ideas. The patient is not nervous/anxious and is not hyperactive.     Medications: I have reviewed the patient's current medications.  Current Outpatient Medications  Medication Sig Dispense Refill  . Accu-Chek FastClix Lancets MISC Test blood sugar once daily.  Diagnosis:  E11.49  Non insulin dependent. 102 each 3  . 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 100 strip 3  . Blood Glucose Calibration (ACCU-CHEK GUIDE CONTROL) LIQD USE AS DIRECTED 1 each 0  . Blood Glucose Monitoring Suppl (ACCU-CHEK GUIDE) w/Device KIT 1 kit by In Vitro route daily. 1 kit 0  . Calcium Citrate-Vitamin D (CITRACAL + D PO) Take 1 tablet by mouth daily.     . cetirizine (ZYRTEC) 10 MG tablet Take 10 mg by mouth at bedtime.    . Cholecalciferol (VITAMIN D3) 5000 UNITS TABS Take 5,000 Units every morning by mouth.     . cyanocobalamin (,VITAMIN B-12,) 1000 MCG/ML injection Inject 1 mL (1,000 mcg total) into the muscle every 30 (thirty) days.    Marland Kitchen ELIQUIS 5 MG TABS tablet Take 1 tablet (5 mg total) by mouth 2 (two) times daily. 180 tablet 3  . fluticasone (FLONASE) 50 MCG/ACT nasal spray Place 2 sprays into both nostrils daily as needed for rhinitis.     Marland Kitchen glipiZIDE (GLUCOTROL) 5 MG tablet Take 1 tablet (5 mg total) by mouth daily before breakfast. 90 tablet 3  .  ibuprofen (ADVIL) 200 MG tablet Take 800 mg by mouth every 8 (eight) hours as needed for headache or mild pain.    Marland Kitchen lisinopril (ZESTRIL) 2.5 MG tablet Take 1 tablet (2.5 mg total) by mouth every evening. 90 tablet 3  . MELATONIN GUMMIES PO Take 2 tablets by mouth at bedtime. CHEW    . metFORMIN (GLUCOPHAGE-XR) 750 MG 24 hr tablet Take 1 tablet (750 mg total) by mouth 2 (two) times daily. 180 tablet 3  . Multiple Vitamins-Minerals (PRESERVISION AREDS 2) CAPS Take 1 capsule by mouth 2  (two) times daily.    Marland Kitchen OTEZLA 30 MG TABS TAKE 1 TABLET BY MOUTH TWICE DAILY 180 tablet 0  . pravastatin (PRAVACHOL) 20 MG tablet Take 1 tablet (20 mg total) by mouth every evening. 90 tablet 3  . pregabalin (LYRICA) 50 MG capsule Take 1 capsule (50 mg total) by mouth 2 (two) times daily. 180 capsule 1  . rOPINIRole (REQUIP) 0.5 MG tablet Take 2 tablets (1 mg total) by mouth at bedtime. 180 tablet 5  . torsemide (DEMADEX) 20 MG tablet Take 20 mg by mouth daily.    Marland Kitchen FLUoxetine (PROZAC) 20 MG capsule Take 2 capsules (40 mg total) by mouth daily. 180 capsule 0   No current facility-administered medications for this visit.    Medication Side Effects: None  Allergies:  Allergies  Allergen Reactions  . 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 Verapamil Shortness Of Breath and Other (See Comments)    CP, irregular/slow HR, dizziness, heartburn, drowsiness, weakness  . Zithromax [Azithromycin Dihydrate] Swelling    Swelling (arms/legs/scrotum)  . Oxycodone Itching and Other (See Comments)    Sweats and itching- tolerates hydrocodone  . Acyclovir And Related Other (See Comments)    Told by MD  . Amiodarone Other (See Comments)    Acute lung toxicity  . Arava [Leflunomide]     Lack of effect  . Latex Itching  . Methotrexate Derivatives     Intolerant- chills, aches, diarrhea.    . Adhesive [Tape] Itching and Rash  . Amlodipine Other (See Comments)    Muscle pain    Past Medical History:  Diagnosis Date  . Allergic rhinitis   . Anticoagulant long-term use    eliquis  . Anxiety   . Arthritis    WRISTS, KNEES, ANKLES  . CAD (coronary artery disease) CARDIOLOGIST-  DR GREGG TAYLOR   Nonobstructive CAD by cath 2006;  HEART CATH AGAIN ON 06/08/13 AFTER CHEST DISCOMFORT / ADMISSION TO [facility] - "MILD NON-OBSTRUCTIVE CAD, NORMAL LV SYSTOLIC FUNCTION"  .  Depression   . Diastolic CHF [facility]) dx 05/3005--- cardiologist-  dr gregg taylor   EF 50-55%  per echo 05/2016  . GERD (gastroesophageal reflux disease)   . H/O cardiac radiofrequency ablation   . History of kidney stones   . HTN (hypertension)   . Hyperlipidemia   . OSA treated with BiPAP followed dr Elsworth Soho (previously dr clance)   per study 02-04-2012 very severe osa AHI 90/hr--  currently uses Bi-Pap every night per pt   . Paroxysmal VT [facility]) cardiologist--  dr Carleene Overlie taylor   RVOT VT diagnosed in 2006 by holter monitor;  VT from LV noted 4/13 - amiodarone started  . Persistent atrial fibrillation [facility]) cardiologist-  dr Carleene Overlie taylor   dx [phone]--  s/p  DCCV's 2013 & 2014 --  currently taking eliquis daily  . PONV (postoperative nausea and vomiting)   . Psoriasis   . Psoriatic arthritis ([place])    rheumotologist-  dr s. Estanislado Pandy  . PTSD (post-traumatic stress disorder)   . Restless leg syndrome   . Stroke ([place])   . Type 2 diabetes mellitus ([place])   . Vertigo    dx by Dr. [name] per patient     Family History  Problem Relation Age of Onset  . Sudden death Father 72  . Heart disease Father        MI at 7  . Heart disease Brother        stents and PPM  . Parkinson's disease Brother   . Cancer Mother        benign tumor, died from surgery complications  . Other Mother        pituitary adenoma  . Heart disease Brother   . Cancer Brother        multiple myeloma  . Sudden death Paternal Grandmother 76  . Sudden death Paternal Uncle 40  . Heart disease Paternal Uncle   . Heart disease Maternal Uncle   . COPD Paternal Uncle   . Colon cancer Neg Hx   . Prostate cancer Neg Hx     Social History   Socioeconomic History  . Marital status: Married    Spouse name: Not on file  . Number of children: 1  . Years of education: Not on file  . Highest education level: Not on file  Occupational History  . Occupation: retired    Comment: Designer, television/film set  Tobacco Use  . Smoking  status: Former Smoker    Packs/day: 3.00    Years: 2.00    Pack years: 6.00    Types: Cigarettes    Quit date: 08/21/1980    Years since quitting: 39.6  . Smokeless tobacco: Never Used  Vaping Use  . Vaping Use: Never used  Substance and Sexual Activity  . Alcohol use: Yes    Alcohol/week: 0.0 standard drinks    Comment: occasional  . Drug use: No  . Sexual activity: Not Currently  Other Topics Concern  . Not on file  Social History Narrative   Married 1971   [employer] grad   1 daughter   Pt's granddaughter lives with her father.    Retired as Designer, television/film set and nonprofit/financial work with [employer] (10/23/16):   Originally from [facility]. Previously lived in [place] shortly. Previously was a [employer]. He worked mostly with non-profit groups. Does have a cat currently. Remote bird exposure. No hot tub or mold exposure. Remote travel to [place], [place], [place], & [place].    Social Determinants of Health   Financial Resource Strain: Not on file  Food Insecurity: Not on file  Transportation Needs: Not on file  Physical Activity: Not on file  Stress: Not on file  Social Connections: Not on file  Intimate Partner Violence: Not on file    Past Medical History, Surgical history, Social history, and Family history were reviewed and updated as appropriate.   Please see review of systems for further details on the patient's review from today.   Objective:   Physical Exam:  There were no vitals taken for this visit.  Physical Exam Neurological:     Mental Status: He is alert and oriented to person, place, and time.     Cranial Nerves: No dysarthria.  Psychiatric:  Attention and Perception: Attention normal.        Mood and Affect: Mood is depressed. Mood is not anxious.        Speech: Speech normal.        Behavior: Behavior is cooperative.        Thought Content: Thought content normal. Thought content is not paranoid or delusional.  Thought content does not include homicidal or suicidal ideation. Thought content does not include homicidal or suicidal plan.        Cognition and Memory: Cognition and memory normal.        Judgment: Judgment normal.     Lab Review:     Component Value Date/Time   NA 144 02/11/2020 1441   NA 140 11/11/2019 1418   NA 139 09/03/2014 1039   K 3.6 02/11/2020 1441   K 4.3 09/03/2014 1039   CL 105 02/11/2020 1441   CO2 29 02/11/2020 1441   CO2 23 09/03/2014 1039   GLUCOSE 105 (H) 02/11/2020 1441   GLUCOSE 156 (H) 09/03/2014 1039   BUN 19 02/11/2020 1441   BUN 22 11/11/2019 1418   BUN 23.0 09/03/2014 1039   CREATININE 1.10 02/11/2020 1441   CREATININE 1.0 09/03/2014 1039   CALCIUM 9.6 02/11/2020 1441   CALCIUM 9.3 09/03/2014 1039   PROT 6.8 02/11/2020 1441   PROT 7.2 11/11/2019 1418   PROT 7.5 09/03/2014 1039   ALBUMIN 4.0 11/11/2019 1418   ALBUMIN 3.5 09/03/2014 1039   AST 13 02/11/2020 1441   AST 15 09/03/2014 1039   ALT 12 02/11/2020 1441   ALT 14 09/03/2014 1039   ALKPHOS 134 (H) 11/11/2019 1418   ALKPHOS 125 09/03/2014 1039   BILITOT 0.8 02/11/2020 1441   BILITOT 0.6 11/11/2019 1418   BILITOT 1.10 09/03/2014 1039   GFRNONAA 66 02/11/2020 1441   GFRAA 77 02/11/2020 1441       Component Value Date/Time   WBC 10.5 02/11/2020 1441   RBC 3.80 (L) 02/11/2020 1441   HGB 11.7 (L) 02/11/2020 1441   HGB 11.9 (L) 11/11/2019 1418   HGB 12.3 (L) 09/03/2014 1037   HCT 34.2 (L) 02/11/2020 1441   HCT 34.6 (L) 11/11/2019 1418   HCT 36.1 (L) 09/03/2014 1037   PLT 274 02/11/2020 1441   PLT 247 11/11/2019 1418   MCV 90.0 02/11/2020 1441   MCV 93 11/11/2019 1418   MCV 88.6 09/03/2014 1037   MCH 30.8 02/11/2020 1441   MCHC 34.2 02/11/2020 1441   RDW 13.2 02/11/2020 1441   RDW 12.9 11/11/2019 1418   RDW 13.9 09/03/2014 1037   LYMPHSABS 1,596 02/11/2020 1441   LYMPHSABS 1.5 11/11/2019 1418   LYMPHSABS 1.2 09/03/2014 1037   MONOABS 0.4 03/16/2019 1053   MONOABS 0.4  09/03/2014 1037   EOSABS 273 02/11/2020 1441   EOSABS 0.4 11/11/2019 1418   BASOSABS 63 02/11/2020 1441   BASOSABS 0.1 11/11/2019 1418   BASOSABS 0.1 09/03/2014 1037    No results found for: POCLITH, LITHIUM   No results found for: PHENYTOIN, PHENOBARB, VALPROATE, CBMZ   .res Assessment: Plan:    Major depressive disorder, recurrent episode, moderate (HCC) - Plan: FLUoxetine (PROZAC) 20 MG capsule  PTSD (post-traumatic stress disorder) - Plan: FLUoxetine (PROZAC) 20 MG capsule   Supportive therapy over losses.  Encourage activity.  Good response to fluoxetine 30 over 20 mg daily until loss of brother.  No SE.  More depression.  Agree to increase fluoxetine to 40 mg daily.   FU 2 mos  Lynder Parents, MD, DFAPA   Please see After Visit Summary for patient specific instructions.  Future Appointments  Date Time Provider [place]  05/12/2020  1:20 PM Ofilia Neas, PA-C [facility] None  06/02/2020 10:00 AM [facility]  06/09/2020 11:00 AM [name], MD [facility]    No orders of the defined types were placed in this encounter.     -------------------------------

## 2020-03-29 ENCOUNTER — Encounter: Payer: Self-pay | Admitting: Rheumatology

## 2020-04-27 ENCOUNTER — Ambulatory Visit (INDEPENDENT_AMBULATORY_CARE_PROVIDER_SITE_OTHER): Payer: Medicare Other

## 2020-04-27 DIAGNOSIS — E538 Deficiency of other specified B group vitamins: Secondary | ICD-10-CM | POA: Diagnosis not present

## 2020-04-27 MED ORDER — CYANOCOBALAMIN 1000 MCG/ML IJ SOLN
1000.0000 ug | Freq: Once | INTRAMUSCULAR | Status: AC
  Administered 2020-04-27: 1000 ug via INTRAMUSCULAR

## 2020-04-27 NOTE — Progress Notes (Signed)
Per orders of Dr. Danise Mina, in Dr. Josefine Class absence, monthly injection of B12, given in R deltoid by Loreen Freud. Patient tolerated injection well.
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
## 2020-05-10 DIAGNOSIS — I4819 Other persistent atrial fibrillation: Secondary | ICD-10-CM | POA: Diagnosis not present

## 2020-05-12 ENCOUNTER — Ambulatory Visit (INDEPENDENT_AMBULATORY_CARE_PROVIDER_SITE_OTHER): Payer: Medicare Other | Admitting: Physician Assistant

## 2020-05-12 ENCOUNTER — Encounter: Payer: Self-pay | Admitting: Physician Assistant

## 2020-05-12 ENCOUNTER — Other Ambulatory Visit: Payer: Self-pay

## 2020-05-12 VITALS — BP 125/68 | HR 76 | Ht 69.5 in | Wt 277.0 lb

## 2020-05-12 DIAGNOSIS — L409 Psoriasis, unspecified: Secondary | ICD-10-CM | POA: Diagnosis not present

## 2020-05-12 DIAGNOSIS — Z8639 Personal history of other endocrine, nutritional and metabolic disease: Secondary | ICD-10-CM

## 2020-05-12 DIAGNOSIS — M25532 Pain in left wrist: Secondary | ICD-10-CM | POA: Diagnosis not present

## 2020-05-12 DIAGNOSIS — L4050 Arthropathic psoriasis, unspecified: Secondary | ICD-10-CM | POA: Diagnosis not present

## 2020-05-12 DIAGNOSIS — M19041 Primary osteoarthritis, right hand: Secondary | ICD-10-CM

## 2020-05-12 DIAGNOSIS — M19071 Primary osteoarthritis, right ankle and foot: Secondary | ICD-10-CM

## 2020-05-12 DIAGNOSIS — G2581 Restless legs syndrome: Secondary | ICD-10-CM

## 2020-05-12 DIAGNOSIS — Z79899 Other long term (current) drug therapy: Secondary | ICD-10-CM | POA: Diagnosis not present

## 2020-05-12 DIAGNOSIS — I472 Ventricular tachycardia, unspecified: Secondary | ICD-10-CM

## 2020-05-12 DIAGNOSIS — Z96653 Presence of artificial knee joint, bilateral: Secondary | ICD-10-CM

## 2020-05-12 DIAGNOSIS — Z8679 Personal history of other diseases of the circulatory system: Secondary | ICD-10-CM

## 2020-05-12 DIAGNOSIS — Z87448 Personal history of other diseases of urinary system: Secondary | ICD-10-CM

## 2020-05-12 DIAGNOSIS — M7662 Achilles tendinitis, left leg: Secondary | ICD-10-CM | POA: Diagnosis not present

## 2020-05-12 DIAGNOSIS — M19072 Primary osteoarthritis, left ankle and foot: Secondary | ICD-10-CM

## 2020-05-12 DIAGNOSIS — Z8669 Personal history of other diseases of the nervous system and sense organs: Secondary | ICD-10-CM

## 2020-05-12 DIAGNOSIS — M19042 Primary osteoarthritis, left hand: Secondary | ICD-10-CM

## 2020-05-18 ENCOUNTER — Other Ambulatory Visit: Payer: Self-pay | Admitting: Family Medicine

## 2020-05-18 DIAGNOSIS — E1149 Type 2 diabetes mellitus with other diabetic neurological complication: Secondary | ICD-10-CM

## 2020-05-18 DIAGNOSIS — E538 Deficiency of other specified B group vitamins: Secondary | ICD-10-CM

## 2020-05-18 DIAGNOSIS — E559 Vitamin D deficiency, unspecified: Secondary | ICD-10-CM

## 2020-05-18 DIAGNOSIS — Z125 Encounter for screening for malignant neoplasm of prostate: Secondary | ICD-10-CM

## 2020-05-26 ENCOUNTER — Other Ambulatory Visit: Payer: Self-pay

## 2020-05-26 ENCOUNTER — Encounter: Payer: Self-pay | Admitting: Psychiatry

## 2020-05-26 ENCOUNTER — Ambulatory Visit (INDEPENDENT_AMBULATORY_CARE_PROVIDER_SITE_OTHER): Payer: Medicare Other | Admitting: Psychiatry

## 2020-05-26 DIAGNOSIS — F331 Major depressive disorder, recurrent, moderate: Secondary | ICD-10-CM | POA: Diagnosis not present

## 2020-05-26 DIAGNOSIS — F4310 Post-traumatic stress disorder, unspecified: Secondary | ICD-10-CM

## 2020-05-26 DIAGNOSIS — F330 Major depressive disorder, recurrent, mild: Secondary | ICD-10-CM | POA: Diagnosis not present

## 2020-05-26 MED ORDER — FLUOXETINE HCL 20 MG PO CAPS: 40.0000 mg | ORAL_CAPSULE | Freq: Every day | ORAL | 1 refills | Status: DC

## 2020-05-26 NOTE — Progress Notes (Signed)
[name] [id number] July 22, 1945 75 y.o.    Subjective:   Patient ID:  [name] is a 75 y.o. (DOB 1945/10/07) male.  Chief Complaint:  Chief Complaint  Patient presents with  . Major depressive disorder, recurrent episode, moderate ([place])  . Follow-up    Depression        Associated symptoms include no decreased concentration and no suicidal ideas.  [name] presents to the office today for follow-up of depression and anxiety.  He has been under my care for many years, 75.  On Prozac all those years.    Last seen Dec 2020 without med changes and the folllowing noted: Served custody papers with F trying to get GD.  Left to live with her F in July.  Depressing time for me but handled it.  Had a supportive friends which helped.  Read [name]'s book which helped.  [place] with it.  She's 74 yo.  Still loves them and sees them.  03/22/20 appt with following noted: Last half of the year not good.  Early June lost dear friend unexpectedly.  Late 02/10/23 only brother died.  Noticed more depression after brother's death.   Wonders about increased fluoxetine from 30 without SE.  Tended to lay in bed longer than he should.  Less motivation and energy and enjoyment. Mild irritability. 74 yo GD doing well in driving and school. Plan: Agree to increase fluoxetine to 40 mg daily.  05/26/20 appt noted: Saw benefit with anxiety after increase fluoxetine and feels better physically.  No SE. Better interest and enjoyment with things.  Not sig depressed now.  More productive.  W noticed he's better. Cardiology said he was boring.  Lost 20# last year.   B was last sibling.   Patient denies difficulty with sleep initiation or maintenance. Denies appetite disturbance.  Patient reports that energy and motivation have been good.  Patient denies any difficulty with concentration.  Patient denies any suicidal ideation.  Feels so much better after ablation for VT.   Marland KitchenPast Psychiatric Medication  Trials: Zoloft, Trazodone, fluoxetine 30  Review of Systems:  Review of Systems  Cardiovascular: Negative for palpitations.  Musculoskeletal: Positive for arthralgias and gait problem.  Neurological: Positive for weakness and numbness. Negative for tremors.  Psychiatric/Behavioral: Positive for depression and dysphoric mood. Negative for agitation, behavioral problems, confusion, decreased concentration, hallucinations, self-injury, sleep disturbance and suicidal ideas. The patient is not nervous/anxious and is not hyperactive.     Medications: I have reviewed the patient's current medications.  Current Outpatient Medications  Medication Sig Dispense Refill  . Accu-Chek FastClix Lancets MISC Test blood sugar once daily.  Diagnosis:  E11.49  Non insulin dependent. 102 each 3  . 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 100 strip 3  . Blood Glucose Calibration (ACCU-CHEK GUIDE CONTROL) LIQD USE AS DIRECTED 1 each 0  . Blood Glucose Monitoring Suppl (ACCU-CHEK GUIDE) w/Device KIT 1 kit by In Vitro route daily. 1 kit 0  . Calcium Citrate-Vitamin D (CITRACAL + D PO) Take 1 tablet by mouth daily.     . cetirizine (ZYRTEC) 10 MG tablet Take 10 mg by mouth at bedtime.    . Cholecalciferol (VITAMIN D3) 5000 UNITS TABS Take 5,000 Units every morning by mouth.     . cyanocobalamin (,VITAMIN B-12,) 1000 MCG/ML injection Inject 1 mL (1,000 mcg total) into the muscle every 30 (thirty) days.    Marland Kitchen ELIQUIS 5 MG TABS tablet  Take 1 tablet (5 mg total) by mouth 2 (two) times daily. 180 tablet 3  . fluticasone (FLONASE) 50 MCG/ACT nasal spray Place 2 sprays into both nostrils daily as needed for rhinitis.     Marland Kitchen glipiZIDE (GLUCOTROL) 5 MG tablet Take 1 tablet (5 mg total) by mouth daily before breakfast. 90 tablet 3  . ibuprofen (ADVIL) 200 MG tablet Take 800 mg by mouth every 8 (eight) hours as needed for headache or mild pain.    Marland Kitchen lisinopril (ZESTRIL) 2.5 MG  tablet Take 1 tablet (2.5 mg total) by mouth every evening. 90 tablet 3  . MELATONIN GUMMIES PO Take 2 tablets by mouth at bedtime. CHEW    . metFORMIN (GLUCOPHAGE-XR) 750 MG 24 hr tablet Take 1 tablet (750 mg total) by mouth 2 (two) times daily. 180 tablet 3  . Multiple Vitamins-Minerals (PRESERVISION AREDS 2) CAPS Take 1 capsule by mouth 2 (two) times daily.    Marland Kitchen OTEZLA 30 MG TABS TAKE 1 TABLET BY MOUTH TWICE DAILY 180 tablet 0  . pravastatin (PRAVACHOL) 80 MG tablet Take 80 mg by mouth daily.    . pregabalin (LYRICA) 50 MG capsule Take 1 capsule (50 mg total) by mouth 2 (two) times daily. 180 capsule 1  . FLUoxetine (PROZAC) 20 MG capsule Take 2 capsules (40 mg total) by mouth daily. 180 capsule 1  . rOPINIRole (REQUIP) 0.5 MG tablet Take 2 tablets (1 mg total) by mouth at bedtime. (Patient not taking: Reported on 05/26/2020) 180 tablet 5   No current facility-administered medications for this visit.    Medication Side Effects: None  Allergies:  Allergies  Allergen Reactions  . 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 Verapamil Shortness Of Breath and Other (See Comments)    CP, irregular/slow HR, dizziness, heartburn, drowsiness, weakness  . Zithromax [Azithromycin Dihydrate] Swelling    Swelling (arms/legs/scrotum)  . Oxycodone Itching and Other (See Comments)    Sweats and itching- tolerates hydrocodone  . Acyclovir And Related Other (See Comments)    Told by MD  . Amiodarone Other (See Comments)    Acute lung toxicity  . Arava [Leflunomide]     Lack of effect  . Latex Itching  . Methotrexate Derivatives     Intolerant- chills, aches, diarrhea.    . Adhesive [Tape] Itching and Rash  . Amlodipine Other (See Comments)    Muscle pain    Past Medical History:  Diagnosis Date  . Allergic rhinitis   . Anticoagulant long-term use    eliquis  .  Anxiety   . Arthritis    WRISTS, KNEES, ANKLES  . CAD (coronary artery disease) CARDIOLOGIST-  DR GREGG TAYLOR   Nonobstructive CAD by cath 2006;  HEART CATH AGAIN ON 06/08/13 AFTER CHEST DISCOMFORT / ADMISSION TO [facility] - "MILD NON-OBSTRUCTIVE CAD, NORMAL LV SYSTOLIC FUNCTION"  . Depression   . Diastolic CHF [facility]) dx 11/8278--- cardiologist-  dr gregg taylor   EF 50-55%  per echo 05/2016  . GERD (gastroesophageal reflux disease)   . H/O cardiac radiofrequency ablation   . History of kidney stones   . HTN (hypertension)   . Hyperlipidemia   . OSA treated with BiPAP followed dr Elsworth Soho (previously dr clance)   per study 02-04-2012 very severe osa AHI 90/hr--  currently uses Bi-Pap every night per pt   . 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 Persistent atrial fibrillation [facility]) cardiologist-  dr gregg taylor   dx [phone]--  s/p  DCCV's 2013 & 2014 --  currently taking eliquis daily  . PONV (postoperative nausea and vomiting)   . Psoriasis   . Psoriatic arthritis ([place])    rheumotologist-  dr s. Estanislado Pandy  . PTSD (post-traumatic stress disorder)   . Restless leg syndrome   . Stroke ([place])   . Type 2 diabetes mellitus ([place])   . Vertigo    dx by Dr. [name] per patient     Family History  Problem Relation Age of Onset  . Sudden death Father 74  . Heart disease Father        MI at 28  . Heart disease Brother        stents and PPM  . Parkinson's disease Brother   . Cancer Mother        benign tumor, died from surgery complications  . Other Mother        pituitary adenoma  . Heart disease Brother   . Cancer Brother        multiple myeloma  . Sudden death Paternal Grandmother 45  . Sudden death Paternal Uncle 20  . Heart disease Paternal Uncle   . Heart disease Maternal Uncle   . COPD Paternal Uncle   . Colon cancer Neg Hx   . Prostate cancer Neg Hx     Social History    Socioeconomic History  . Marital status: Married    Spouse name: Not on file  . Number of children: 1  . Years of education: Not on file  . Highest education level: Not on file  Occupational History  . Occupation: retired    Comment: Designer, television/film set  Tobacco Use  . Smoking status: Former Smoker    Packs/day: 3.00    Years: 2.00    Pack years: 6.00    Types: Cigarettes    Quit date: 08/21/1980    Years since quitting: 39.7  . Smokeless tobacco: Never Used  Vaping Use  . Vaping Use: Never used  Substance and Sexual Activity  . Alcohol use: Yes    Alcohol/week: 0.0 standard drinks    Comment: occasional  . Drug use: No  . Sexual activity: Not Currently  Other Topics Concern  . Not on file  Social History Narrative   Married 1971   [employer] grad   1 daughter   Pt's granddaughter lives with her father.    Retired as Designer, television/film set and nonprofit/financial work with [employer] (10/23/16):   Originally from [facility] [place] [facility]. Previously lived in [place] shortly. Previously was a [employer]. He worked mostly with non-profit groups. Does have a cat currently. Remote bird exposure. No hot tub or mold exposure. Remote travel to [place], [place], [place], & [place].    Social Determinants of Health   Financial Resource Strain: Not on file  Food Insecurity: Not on file  Transportation Needs: Not on file  Physical Activity: Not on file  Stress: Not on file  Social Connections: Not on file  Intimate Partner Violence: Not on file    Past Medical History, Surgical history, Social history, and Family history were reviewed and updated as appropriate.   Please see review of systems for further details on the patient's review from today.   Objective:  Physical Exam:  There were no vitals taken for this visit.  Physical Exam Neurological:     Mental Status: He is alert and oriented to person, place, and time.     Cranial Nerves: No dysarthria.   Psychiatric:        Attention and Perception: Attention normal.        Mood and Affect: Mood is not anxious or depressed.        Speech: Speech normal.        Behavior: Behavior is cooperative.        Thought Content: Thought content normal. Thought content is not paranoid or delusional. Thought content does not include homicidal or suicidal ideation. Thought content does not include homicidal or suicidal plan.        Cognition and Memory: Cognition and memory normal.        Judgment: Judgment normal.     Lab Review:     Component Value Date/Time   NA 144 02/11/2020 1441   NA 140 11/11/2019 1418   NA 139 09/03/2014 1039   K 3.6 02/11/2020 1441   K 4.3 09/03/2014 1039   CL 105 02/11/2020 1441   CO2 29 02/11/2020 1441   CO2 23 09/03/2014 1039   GLUCOSE 105 (H) 02/11/2020 1441   GLUCOSE 156 (H) 09/03/2014 1039   BUN 19 02/11/2020 1441   BUN 22 11/11/2019 1418   BUN 23.0 09/03/2014 1039   CREATININE 1.10 02/11/2020 1441   CREATININE 1.0 09/03/2014 1039   CALCIUM 9.6 02/11/2020 1441   CALCIUM 9.3 09/03/2014 1039   PROT 6.8 02/11/2020 1441   PROT 7.2 11/11/2019 1418   PROT 7.5 09/03/2014 1039   ALBUMIN 4.0 11/11/2019 1418   ALBUMIN 3.5 09/03/2014 1039   AST 13 02/11/2020 1441   AST 15 09/03/2014 1039   ALT 12 02/11/2020 1441   ALT 14 09/03/2014 1039   ALKPHOS 134 (H) 11/11/2019 1418   ALKPHOS 125 09/03/2014 1039   BILITOT 0.8 02/11/2020 1441   BILITOT 0.6 11/11/2019 1418   BILITOT 1.10 09/03/2014 1039   GFRNONAA 66 02/11/2020 1441   GFRAA 77 02/11/2020 1441       Component Value Date/Time   WBC 10.5 02/11/2020 1441   RBC 3.80 (L) 02/11/2020 1441   HGB 11.7 (L) 02/11/2020 1441   HGB 11.9 (L) 11/11/2019 1418   HGB 12.3 (L) 09/03/2014 1037   HCT 34.2 (L) 02/11/2020 1441   HCT 34.6 (L) 11/11/2019 1418   HCT 36.1 (L) 09/03/2014 1037   PLT 274 02/11/2020 1441   PLT 247 11/11/2019 1418   MCV 90.0 02/11/2020 1441   MCV 93 11/11/2019 1418   MCV 88.6 09/03/2014 1037    MCH 30.8 02/11/2020 1441   MCHC 34.2 02/11/2020 1441   RDW 13.2 02/11/2020 1441   RDW 12.9 11/11/2019 1418   RDW 13.9 09/03/2014 1037   LYMPHSABS 1,596 02/11/2020 1441   LYMPHSABS 1.5 11/11/2019 1418   LYMPHSABS 1.2 09/03/2014 1037   MONOABS 0.4 03/16/2019 1053   MONOABS 0.4 09/03/2014 1037   EOSABS 273 02/11/2020 1441   EOSABS 0.4 11/11/2019 1418   BASOSABS 63 02/11/2020 1441   BASOSABS 0.1 11/11/2019 1418   BASOSABS 0.1 09/03/2014 1037    No results found for: POCLITH, LITHIUM   No results found for: PHENYTOIN, PHENOBARB, VALPROATE, CBMZ   .res Assessment: Plan:    Mild recurrent major depression ([place])  PTSD (post-traumatic stress disorder) - Plan: FLUoxetine (PROZAC) 20 MG capsule  Major depressive disorder, recurrent  episode, moderate ([facility]) - Plan: FLUoxetine (PROZAC) 20 MG capsule   Supportive therapy over losses.  Encourage activity.  Good response to fluoxetine 30 over 20 mg daily until loss of brother.  No SE.  More depression.  Agree to increase fluoxetine to 40 mg daily.   FU 2 mos  Lynder Parents, MD, DFAPA   Please see After Visit Summary for patient specific instructions.  Future Appointments  Date Time Provider [place]  06/02/2020 10:00 AM [facility]  06/17/2020 10:30 AM [name], MD [facility]  08/18/2020  1:15 PM Bo Merino, MD [facility] None    No orders of the defined types were placed in this encounter.     -------------------------------

## 2020-06-02 ENCOUNTER — Other Ambulatory Visit (INDEPENDENT_AMBULATORY_CARE_PROVIDER_SITE_OTHER): Payer: Medicare Other

## 2020-06-02 ENCOUNTER — Other Ambulatory Visit: Payer: Self-pay

## 2020-06-02 DIAGNOSIS — E1149 Type 2 diabetes mellitus with other diabetic neurological complication: Secondary | ICD-10-CM

## 2020-06-02 DIAGNOSIS — Z125 Encounter for screening for malignant neoplasm of prostate: Secondary | ICD-10-CM

## 2020-06-02 DIAGNOSIS — E559 Vitamin D deficiency, unspecified: Secondary | ICD-10-CM | POA: Diagnosis not present

## 2020-06-02 DIAGNOSIS — E538 Deficiency of other specified B group vitamins: Secondary | ICD-10-CM | POA: Diagnosis not present

## 2020-06-02 LAB — CBC WITH DIFFERENTIAL/PLATELET
Basophils Absolute: 0.1 10*3/uL (ref 0.0–0.1)
Basophils Relative: 0.8 % (ref 0.0–3.0)
Eosinophils Absolute: 0.3 10*3/uL (ref 0.0–0.7)
Eosinophils Relative: 4 % (ref 0.0–5.0)
HCT: 33.5 % — ABNORMAL LOW (ref 39.0–52.0)
Hemoglobin: 11.4 g/dL — ABNORMAL LOW (ref 13.0–17.0)
Lymphocytes Relative: 19.3 % (ref 12.0–46.0)
Lymphs Abs: 1.6 10*3/uL (ref 0.7–4.0)
MCHC: 34.2 g/dL (ref 30.0–36.0)
MCV: 91.8 fl (ref 78.0–100.0)
Monocytes Absolute: 0.6 10*3/uL (ref 0.1–1.0)
Monocytes Relative: 7 % (ref 3.0–12.0)
Neutro Abs: 5.7 10*3/uL (ref 1.4–7.7)
Neutrophils Relative %: 68.9 % (ref 43.0–77.0)
Platelets: 206 10*3/uL (ref 150.0–400.0)
RBC: 3.64 Mil/uL — ABNORMAL LOW (ref 4.22–5.81)
RDW: 14.7 % (ref 11.5–15.5)
WBC: 8.3 10*3/uL (ref 4.0–10.5)

## 2020-06-02 LAB — LIPID PANEL
Cholesterol: 133 mg/dL (ref 0–200)
HDL: 31.5 mg/dL — ABNORMAL LOW (ref >39.00)
LDL Cholesterol: 70 mg/dL (ref 0–99)
NonHDL: 101.72
Total CHOL/HDL Ratio: 4
Triglycerides: 157 mg/dL — ABNORMAL HIGH (ref 0.0–149.0)
VLDL: 31.4 mg/dL (ref 0.0–40.0)

## 2020-06-02 LAB — VITAMIN B12: Vitamin B-12: 359 pg/mL (ref 211–911)

## 2020-06-02 LAB — COMPREHENSIVE METABOLIC PANEL
ALT: 15 U/L (ref 0–53)
AST: 13 U/L (ref 0–37)
Albumin: 3.9 g/dL (ref 3.5–5.2)
Alkaline Phosphatase: 99 U/L (ref 39–117)
BUN: 27 mg/dL — ABNORMAL HIGH (ref 6–23)
CO2: 27 mEq/L (ref 19–32)
Calcium: 9.7 mg/dL (ref 8.4–10.5)
Chloride: 106 mEq/L (ref 96–112)
Creatinine, Ser: 1.14 mg/dL (ref 0.40–1.50)
GFR: 63.65 mL/min (ref >60.00)
Glucose, Bld: 136 mg/dL — ABNORMAL HIGH (ref 70–99)
Potassium: 4.1 mEq/L (ref 3.5–5.1)
Sodium: 142 mEq/L (ref 135–145)
Total Bilirubin: 0.6 mg/dL (ref 0.2–1.2)
Total Protein: 6.9 g/dL (ref 6.0–8.3)

## 2020-06-02 LAB — PSA, MEDICARE: PSA: 0.77 ng/ml (ref 0.10–4.00)

## 2020-06-02 LAB — VITAMIN D 25 HYDROXY (VIT D DEFICIENCY, FRACTURES): VITD: 57.15 ng/mL (ref 30.00–100.00)

## 2020-06-02 LAB — HEMOGLOBIN A1C: Hgb A1c MFr Bld: 6.2 % (ref 4.6–6.5)

## 2020-06-09 ENCOUNTER — Encounter: Payer: Medicare Other | Admitting: Family Medicine

## 2020-06-16 ENCOUNTER — Other Ambulatory Visit: Payer: Self-pay | Admitting: Family Medicine

## 2020-06-16 NOTE — Telephone Encounter (Signed)
Refill request for Lyrica 50 mg capsule   LOV - 02/09/20 Next OV - 06/17/20 Last refill - 02/09/20 #180/1

## 2020-06-17 ENCOUNTER — Other Ambulatory Visit: Payer: Self-pay

## 2020-06-17 ENCOUNTER — Encounter: Payer: Self-pay | Admitting: Family Medicine

## 2020-06-17 ENCOUNTER — Ambulatory Visit (INDEPENDENT_AMBULATORY_CARE_PROVIDER_SITE_OTHER): Payer: Medicare Other | Admitting: Family Medicine

## 2020-06-17 VITALS — BP 132/84 | HR 65 | Temp 97.2°F | Ht 70.0 in | Wt 279.0 lb

## 2020-06-17 DIAGNOSIS — G4733 Obstructive sleep apnea (adult) (pediatric): Secondary | ICD-10-CM | POA: Diagnosis not present

## 2020-06-17 DIAGNOSIS — G2581 Restless legs syndrome: Secondary | ICD-10-CM | POA: Diagnosis not present

## 2020-06-17 DIAGNOSIS — Z0000 Encounter for general adult medical examination without abnormal findings: Secondary | ICD-10-CM | POA: Diagnosis not present

## 2020-06-17 DIAGNOSIS — R4586 Emotional lability: Secondary | ICD-10-CM

## 2020-06-17 DIAGNOSIS — D638 Anemia in other chronic diseases classified elsewhere: Secondary | ICD-10-CM | POA: Diagnosis not present

## 2020-06-17 DIAGNOSIS — E1149 Type 2 diabetes mellitus with other diabetic neurological complication: Secondary | ICD-10-CM

## 2020-06-17 DIAGNOSIS — E538 Deficiency of other specified B group vitamins: Secondary | ICD-10-CM

## 2020-06-17 DIAGNOSIS — Z7189 Other specified counseling: Secondary | ICD-10-CM

## 2020-06-17 DIAGNOSIS — G629 Polyneuropathy, unspecified: Secondary | ICD-10-CM | POA: Diagnosis not present

## 2020-06-17 MED ORDER — CYANOCOBALAMIN 1000 MCG/ML IJ SOLN
1000.0000 ug | Freq: Once | INTRAMUSCULAR | Status: AC
  Administered 2020-06-17: 1000 ug via INTRAMUSCULAR

## 2020-06-17 MED ORDER — ROPINIROLE HCL 1 MG PO TABS: 1.0000 mg | ORAL_TABLET | Freq: Every day | ORAL | 3 refills | Status: DC

## 2020-06-17 NOTE — Patient Instructions (Addendum)
See if the B12 injection today helps with foot troubles.  If not let me know so we can see about options with lyrica.   Update me as needed.  Plan on recheck in about 6 months.

## 2020-06-17 NOTE — Progress Notes (Signed)
This visit occurred during the SARS-CoV-2 public health emergency.  Safety protocols were in place, including screening questions prior to the visit, additional usage of staff PPE, and extensive cleaning of exam room while observing appropriate contact time as indicated for disinfecting solutions.  I have personally reviewed the Medicare Annual Wellness questionnaire and have noted 1. The patient's medical and social history 2. Their use of alcohol, tobacco or illicit drugs 3. Their current medications and supplements 4. The patient's functional ability including ADL's, fall risks, home safety risks and hearing or visual             impairment. 5. Diet and physical activities 6. Evidence for depression or mood disorders  The patients weight, height, BMI have been recorded in the chart and visual acuity is per eye clinic.  I have made referrals, counseling and provided education to the patient based review of the above and I have provided the pt with a written personalized care plan for preventive services.  Provider list updated- see scanned forms.  Routine anticipatory guidance given to patient.  See health maintenance. The possibility exists that previously documented standard health maintenance information may have been brought forward from a previous encounter into this note.  If needed, that same information has been updated to reflect the current situation based on today's encounter.    Flu 2021 Shingles discussed with patient PNA up-to-date. Tetanus discussed with patient COVID vaccine up-to-date. Cologuard previously done. Prostate cancer screening 2022 Advance directive-wife designated patient were incapacitated. Cognitive function addressed- see scanned forms- and if abnormal then additional documentation follows.   Fall cautions discussed with patient.  Using a cane at baseline  He had psych f/u, with Dr. Clovis Pu.  Mood is better.  2021 was tough for patient, his brother and  another friend died.  Condolences offered.    OSA compliant with CPAP.  Used nightly.  History of restless leg.  requip helps, 1mg  at night.  No ADE on med.    He had cards f/u in the meantime, back in 05/2020.  Discussed.  No chest pain.  He feels better on current medications.  Diabetes:  Using medications without difficulties: yes Hypoglycemic episodes: two episodes, only if prolonged fasting.  Cautions d/w pt.   Hyperglycemic episodes:no Feet problems: Still on lyrica at baseline.  Still with burning.   Blood Sugars averaging: ~115-140 eye exam within last year: yes  B12 on replacement.  He'll if dose today helps with food sx. May need to change lyrica.    Anemia.  Stable.  No bleeding.  No CP.  SOB is stable with a lot of stairs, but no sx o/w.   PMH and SH reviewed  Meds, vitals, and allergies reviewed.   ROS: Per HPI.  Unless specifically indicated otherwise in HPI, the patient denies:  General: fever. Eyes: acute vision changes ENT: sore throat Cardiovascular: chest pain Respiratory: SOB GI: vomiting GU: dysuria Musculoskeletal: acute back pain Derm: acute rash Neuro: acute motor dysfunction Psych: worsening mood Endocrine: polydipsia Heme: bleeding Allergy: hayfever  GEN: nad, alert and oriented HEENT: NCAT NECK: supple w/o LA CV: rrr. PULM: ctab, no inc wob ABD: soft, +bs EXT: no edema SKIN: Well-perfused.  Diabetic foot exam: Normal inspection No skin breakdown No calluses  Normal DP pulses Normal sensation to light touch and monofilament Nails normal

## 2020-06-19 NOTE — Telephone Encounter (Signed)
Sent. Thanks.   

## 2020-06-19 NOTE — Assessment & Plan Note (Signed)
Stable.  No bleeding.  No CP.  SOB is stable with a lot of stairs, but no sx o/w.  Would continue with B12 replacement.

## 2020-06-19 NOTE — Assessment & Plan Note (Signed)
OSA compliant with CPAP.  Used nightly.  Would continue as is.

## 2020-06-19 NOTE — Assessment & Plan Note (Signed)
He had psych f/u, with Dr. Clovis Pu.  Mood is better.  2021 was tough for patient, his brother and another friend died.  Condolences offered.

## 2020-06-19 NOTE — Assessment & Plan Note (Signed)
Would continue Lyrica for now.  Foot cautions discussed with patient.  I want him to see if his B12 shot today significantly improves his symptoms in the next few days.  If not we may need to adjust his Lyrica dose.  He will update me as needed.

## 2020-06-19 NOTE — Assessment & Plan Note (Signed)
Advance directive-wife designated patient were incapacitated. 

## 2020-06-19 NOTE — Assessment & Plan Note (Signed)
A1c controlled.  Continue glipizide Metformin.  Labs discussed with patient.  He agrees with plan.  Recheck periodically.

## 2020-06-19 NOTE — Assessment & Plan Note (Signed)
  Flu 2021 Shingles discussed with patient PNA up-to-date. Tetanus discussed with patient COVID vaccine up-to-date. Cologuard previously done. Prostate cancer screening 2022 Advance directive-wife designated patient were incapacitated. Cognitive function addressed- see scanned forms- and if abnormal then additional documentation follows.

## 2020-06-19 NOTE — Assessment & Plan Note (Signed)
Continue replacement.  Dose done today.

## 2020-06-19 NOTE — Assessment & Plan Note (Signed)
requip helps, 1mg  at night.  No ADE on med.  Would continue as is.

## 2020-07-19 ENCOUNTER — Ambulatory Visit (INDEPENDENT_AMBULATORY_CARE_PROVIDER_SITE_OTHER): Payer: Medicare Other | Admitting: *Deleted

## 2020-07-19 ENCOUNTER — Other Ambulatory Visit: Payer: Self-pay

## 2020-07-19 DIAGNOSIS — E538 Deficiency of other specified B group vitamins: Secondary | ICD-10-CM | POA: Diagnosis not present

## 2020-07-19 MED ORDER — CYANOCOBALAMIN 1000 MCG/ML IJ SOLN
1000.0000 ug | Freq: Once | INTRAMUSCULAR | Status: AC
  Administered 2020-07-19: 1000 ug via INTRAMUSCULAR

## 2020-07-19 NOTE — Progress Notes (Signed)
Per orders of Allie Bossier, NP, injection of B12 given by Tammi Sou. Patient tolerated injection well.  PCP out of the office
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
## 2020-08-10 DIAGNOSIS — M545 Low back pain, unspecified: Secondary | ICD-10-CM | POA: Diagnosis not present

## 2020-08-10 DIAGNOSIS — M5459 Other low back pain: Secondary | ICD-10-CM | POA: Diagnosis not present

## 2020-08-10 DIAGNOSIS — M25552 Pain in left hip: Secondary | ICD-10-CM | POA: Diagnosis not present

## 2020-08-12 ENCOUNTER — Telehealth: Payer: Self-pay | Admitting: Family Medicine

## 2020-08-12 NOTE — Chronic Care Management (AMB) (Signed)
  Chronic Care Management   Outreach Note  08/12/2020 Name: [name] MRN: [id number] DOB: 24-Oct-1945  Referred by: [name], MD Reason for referral : No chief complaint on file.   An unsuccessful telephone outreach was attempted today. The patient was referred to the pharmacist for assistance with care management and care coordination.   Follow Up Plan:   Lauretta Grill Upstream Scheduler

## 2020-08-18 ENCOUNTER — Ambulatory Visit (INDEPENDENT_AMBULATORY_CARE_PROVIDER_SITE_OTHER): Payer: Medicare Other | Admitting: Rheumatology

## 2020-08-18 ENCOUNTER — Encounter: Payer: Self-pay | Admitting: Rheumatology

## 2020-08-18 ENCOUNTER — Other Ambulatory Visit: Payer: Self-pay

## 2020-08-18 VITALS — BP 156/92 | HR 75 | Resp 16 | Ht 69.5 in | Wt 275.2 lb

## 2020-08-18 DIAGNOSIS — Z96653 Presence of artificial knee joint, bilateral: Secondary | ICD-10-CM

## 2020-08-18 DIAGNOSIS — M722 Plantar fascial fibromatosis: Secondary | ICD-10-CM | POA: Diagnosis not present

## 2020-08-18 DIAGNOSIS — Z8669 Personal history of other diseases of the nervous system and sense organs: Secondary | ICD-10-CM | POA: Diagnosis not present

## 2020-08-18 DIAGNOSIS — M19071 Primary osteoarthritis, right ankle and foot: Secondary | ICD-10-CM

## 2020-08-18 DIAGNOSIS — Z8639 Personal history of other endocrine, nutritional and metabolic disease: Secondary | ICD-10-CM | POA: Diagnosis not present

## 2020-08-18 DIAGNOSIS — G8929 Other chronic pain: Secondary | ICD-10-CM

## 2020-08-18 DIAGNOSIS — Z87448 Personal history of other diseases of urinary system: Secondary | ICD-10-CM

## 2020-08-18 DIAGNOSIS — M5441 Lumbago with sciatica, right side: Secondary | ICD-10-CM

## 2020-08-18 DIAGNOSIS — I472 Ventricular tachycardia, unspecified: Secondary | ICD-10-CM

## 2020-08-18 DIAGNOSIS — M25532 Pain in left wrist: Secondary | ICD-10-CM

## 2020-08-18 DIAGNOSIS — L409 Psoriasis, unspecified: Secondary | ICD-10-CM | POA: Diagnosis not present

## 2020-08-18 DIAGNOSIS — Z79899 Other long term (current) drug therapy: Secondary | ICD-10-CM | POA: Diagnosis not present

## 2020-08-18 DIAGNOSIS — M7662 Achilles tendinitis, left leg: Secondary | ICD-10-CM

## 2020-08-18 DIAGNOSIS — L4050 Arthropathic psoriasis, unspecified: Secondary | ICD-10-CM

## 2020-08-18 DIAGNOSIS — Z8679 Personal history of other diseases of the circulatory system: Secondary | ICD-10-CM | POA: Diagnosis not present

## 2020-08-18 DIAGNOSIS — G2581 Restless legs syndrome: Secondary | ICD-10-CM

## 2020-08-18 DIAGNOSIS — Z7901 Long term (current) use of anticoagulants: Secondary | ICD-10-CM

## 2020-08-18 DIAGNOSIS — M19042 Primary osteoarthritis, left hand: Secondary | ICD-10-CM

## 2020-08-18 DIAGNOSIS — M19041 Primary osteoarthritis, right hand: Secondary | ICD-10-CM | POA: Diagnosis not present

## 2020-08-18 DIAGNOSIS — M19072 Primary osteoarthritis, left ankle and foot: Secondary | ICD-10-CM

## 2020-08-18 NOTE — Patient Instructions (Signed)
Plantar Fasciitis Rehab Ask your health care provider which exercises are safe for you. Do exercises exactly as told by your health care provider and adjust them as directed. It is normal to feel mild stretching, pulling, tightness, or discomfort as you do these exercises. Stop right away if you feel sudden pain or your pain gets worse. Do not begin these exercises until told by your health care provider. Stretching and range-of-motion exercises These exercises warm up your muscles and joints and improve the movement and flexibility of your foot. These exercises also help to relieve pain. Plantar fascia stretch 1. Sit with your left / right leg crossed over your opposite knee. 2. Hold your heel with one hand with that thumb near your arch. With your other hand, hold your toes and gently pull them back toward the top of your foot. You should feel a stretch on the base (bottom) of your toes, or the bottom of your foot (plantar fascia), or both. 3. Hold this stretch for__________ seconds. 4. Slowly release your toes and return to the starting position. Repeat __________ times. Complete this exercise __________ times a day.   Gastrocnemius stretch, standing This exercise is also called a calf (gastroc) stretch. It stretches the muscles in the back of the upper calf. 1. Stand with your hands against a wall. 2. Extend your left / right leg behind you, and bend your front knee slightly. 3. Keeping your heels on the floor, your toes facing forward, and your back knee straight, shift your weight toward the wall. Do not arch your back. You should feel a gentle stretch in your upper calf. 4. Hold this position for __________ seconds. Repeat __________ times. Complete this exercise __________ times a day.   Soleus stretch, standing This exercise is also called a calf (soleus) stretch. It stretches the muscles in the back of the lower calf. 1. Stand with your hands against a wall. 2. Extend your left / right  leg behind you, and bend your front knee slightly. 3. Keeping your heels on the floor and your toes facing forward, bend your back knee and shift your weight slightly over your back leg. You should feel a gentle stretch deep in your lower calf. 4. Hold this position for __________ seconds. Repeat __________ times. Complete this exercise __________ times a day. Gastroc and soleus stretch, standing step This exercise stretches the muscles in the back of the lower leg. These muscles are in the upper calf (gastrocnemius) and the lower calf (soleus). 1. Stand with the ball of your left / right foot on the front of a step. The ball of your foot is on the walking surface, right under your toes. 2. Keep your other foot firmly on the same step. 3. Hold on to the wall or a railing for balance. 4. Slowly lift your other foot, allowing your body weight to press your heel down over the edge of the front of the step. Keep knee straight and unbent. You should feel a stretch in your calf. 5. Hold this position for __________ seconds. 6. Return both feet to the step. 7. Repeat this exercise with a slight bend in your left / right knee. Repeat __________ times with your left / right knee straight and __________ times with your left / right knee bent. Complete this exercise __________ times a day. Balance exercise This exercise builds your balance and strength control of your arch to help take pressure off your plantar fascia. Single leg stand If this exercise   is too easy, you can try it with your eyes closed or while standing on a pillow. 1. Without shoes, stand near a railing or in a doorway. You may hold on to the railing or door frame as needed. 2. Stand on your left / right foot. Keep your big toe down on the floor and lift the arch of your foot. You should feel a stretch across the bottom of your foot and your arch. Do not let your foot roll inward. 3. Hold this position for __________ seconds. Repeat  __________ times. Complete this exercise __________ times a day. This information is not intended to replace advice given to you by your health care provider. Make sure you discuss any questions you have with your health care provider. Document Revised: 12/31/2019 Document Reviewed: 12/31/2019 Elsevier Patient Education  2021 [place]. Back Exercises The following exercises strengthen the muscles that help to support the trunk and back. They also help to keep the lower back flexible. Doing these exercises can help to prevent back pain or lessen existing pain.  If you have back pain or discomfort, try doing these exercises 2-3 times each day or as told by your health care provider.  As your pain improves, do them once each day, but increase the number of times that you repeat the steps for each exercise (do more repetitions).  To prevent the recurrence of back pain, continue to do these exercises once each day or as told by your health care provider. Do exercises exactly as told by your health care provider and adjust them as directed. It is normal to feel mild stretching, pulling, tightness, or discomfort as you do these exercises, but you should stop right away if you feel sudden pain or your pain gets worse. Exercises Single knee to chest Repeat these steps 3-5 times for each leg: 5. Lie on your back on a firm bed or the floor with your legs extended. 6. Bring one knee to your chest. Your other leg should stay extended and in contact with the floor. 7. Hold your knee in place by grabbing your knee or thigh with both hands and hold. 8. Pull on your knee until you feel a gentle stretch in your lower back or buttocks. 9. Hold the stretch for 10-30 seconds. 10. Slowly release and straighten your leg. Pelvic tilt Repeat these steps 5-10 times: 5. Lie on your back on a firm bed or the floor with your legs extended. 6. Bend your knees so they are pointing toward the ceiling and your feet are  flat on the floor. 7. Tighten your lower abdominal muscles to press your lower back against the floor. This motion will tilt your pelvis so your tailbone points up toward the ceiling instead of pointing to your feet or the floor. 8. With gentle tension and even breathing, hold this position for 5-10 seconds. Cat-cow Repeat these steps until your lower back becomes more flexible: 8. Get into a hands-and-knees position on a firm surface. Keep your hands under your shoulders, and keep your knees under your hips. You may place padding under your knees for comfort. 9. Let your head hang down toward your chest. Contract your abdominal muscles and point your tailbone toward the floor so your lower back becomes rounded like the back of a cat. 10. Hold this position for 5 seconds. 11. Slowly lift your head, let your abdominal muscles relax and point your tailbone up toward the ceiling so your back forms a sagging arch  like the back of a cow. 12. Hold this position for 5 seconds.   Press-ups Repeat these steps 5-10 times: 4. Lie on your abdomen (face-down) on the floor. 5. Place your palms near your head, about shoulder-width apart. 6. Keeping your back as relaxed as possible and keeping your hips on the floor, slowly straighten your arms to raise the top half of your body and lift your shoulders. Do not use your back muscles to raise your upper torso. You may adjust the placement of your hands to make yourself more comfortable. 7. Hold this position for 5 seconds while you keep your back relaxed. 8. Slowly return to lying flat on the floor.   Bridges Repeat these steps 10 times: 1. Lie on your back on a firm surface. 2. Bend your knees so they are pointing toward the ceiling and your feet are flat on the floor. Your arms should be flat at your sides, next to your body. 3. Tighten your buttocks muscles and lift your buttocks off the floor until your waist is at almost the same height as your knees. You  should feel the muscles working in your buttocks and the back of your thighs. If you do not feel these muscles, slide your feet 1-2 inches farther away from your buttocks. 4. Hold this position for 3-5 seconds. 5. Slowly lower your hips to the starting position, and allow your buttocks muscles to relax completely. If this exercise is too easy, try doing it with your arms crossed over your chest.   Abdominal crunches Repeat these steps 5-10 times: 1. Lie on your back on a firm bed or the floor with your legs extended. 2. Bend your knees so they are pointing toward the ceiling and your feet are flat on the floor. 3. Cross your arms over your chest. 4. Tip your chin slightly toward your chest without bending your neck. 5. Tighten your abdominal muscles and slowly raise your trunk (torso) high enough to lift your shoulder blades a tiny bit off the floor. Avoid raising your torso higher than that because it can put too much stress on your low back and does not help to strengthen your abdominal muscles. 6. Slowly return to your starting position. Back lifts Repeat these steps 5-10 times: 1. Lie on your abdomen (face-down) with your arms at your sides, and rest your forehead on the floor. 2. Tighten the muscles in your legs and your buttocks. 3. Slowly lift your chest off the floor while you keep your hips pressed to the floor. Keep the back of your head in line with the curve in your back. Your eyes should be looking at the floor. 4. Hold this position for 3-5 seconds. 5. Slowly return to your starting position. Contact a health care provider if:  Your back pain or discomfort gets much worse when you do an exercise.  Your worsening back pain or discomfort does not lessen within 2 hours after you exercise. If you have any of these problems, stop doing these exercises right away. Do not do them again unless your health care provider says that you can. Get help right away if:  You develop sudden,  severe back pain. If this happens, stop doing the exercises right away. Do not do them again unless your health care provider says that you can. This information is not intended to replace advice given to you by your health care provider. Make sure you discuss any questions you have with your health care provider.  Document Revised: 07/24/2018 Document Reviewed: 12/19/2017 Elsevier Patient Education  [place].

## 2020-08-22 ENCOUNTER — Telehealth: Payer: Self-pay | Admitting: Family Medicine

## 2020-08-22 NOTE — Chronic Care Management (AMB) (Signed)
  Chronic Care Management   Outreach Note  08/22/2020 Name: [name] MRN: [id number] DOB: 17-Sep-1945  Referred by: [name], MD Reason for referral : No chief complaint on file.   A second unsuccessful telephone outreach was attempted today. The patient was referred to pharmacist for assistance with care management and care coordination.  Follow Up Plan:   Tatjana Dellinger [employer] scheduler

## 2020-08-23 ENCOUNTER — Encounter: Payer: Self-pay | Admitting: Family Medicine

## 2020-08-24 ENCOUNTER — Telehealth: Payer: Self-pay | Admitting: Family Medicine

## 2020-08-24 ENCOUNTER — Encounter: Payer: Self-pay | Admitting: Family Medicine

## 2020-08-24 ENCOUNTER — Other Ambulatory Visit: Payer: Self-pay

## 2020-08-24 ENCOUNTER — Telehealth (INDEPENDENT_AMBULATORY_CARE_PROVIDER_SITE_OTHER): Payer: Medicare Other | Admitting: Family Medicine

## 2020-08-24 VITALS — HR 62 | Ht 69.5 in | Wt 273.0 lb

## 2020-08-24 DIAGNOSIS — U071 COVID-19: Secondary | ICD-10-CM

## 2020-08-24 MED ORDER — NIRMATRELVIR/RITONAVIR (PAXLOVID)TABLET
3.0000 | ORAL_TABLET | Freq: Two times a day (BID) | ORAL | 0 refills | Status: AC
  Filled 2020-08-24: qty 30, 5d supply, fill #0
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
## 2020-08-24 NOTE — Telephone Encounter (Signed)
Please check on patient regarding COVID diagnosis/treatment/condition.  Thanks.

## 2020-08-26 NOTE — Telephone Encounter (Signed)
Spoke with patient and he was tested Saturday and Monday and both covid tests were positive. He had a virtual appt with Dr. Gena Fray on Wednesday and was given the antiviral. Patient states he is about 10-15% better right now.

## 2020-08-26 NOTE — Telephone Encounter (Signed)
LMTCB

## 2020-08-29 NOTE — Telephone Encounter (Signed)
Noted. Thanks.

## 2020-09-10 ENCOUNTER — Other Ambulatory Visit: Payer: Self-pay | Admitting: Family Medicine

## 2020-09-13 ENCOUNTER — Ambulatory Visit: Payer: Medicare Other

## 2020-09-14 ENCOUNTER — Other Ambulatory Visit: Payer: Self-pay | Admitting: Family Medicine

## 2020-09-14 MED ORDER — ACCU-CHEK FASTCLIX LANCETS MISC: 3 refills | Status: DC

## 2020-09-14 NOTE — Addendum Note (Signed)
Addended by: Sherrilee Gilles B on: 09/14/2020 02:35 PM   Modules accepted: Orders

## 2020-09-15 ENCOUNTER — Other Ambulatory Visit: Payer: Self-pay | Admitting: Psychiatry

## 2020-09-15 ENCOUNTER — Telehealth: Payer: Self-pay | Admitting: Family Medicine

## 2020-09-15 DIAGNOSIS — F331 Major depressive disorder, recurrent, moderate: Secondary | ICD-10-CM

## 2020-09-15 DIAGNOSIS — F4310 Post-traumatic stress disorder, unspecified: Secondary | ICD-10-CM

## 2020-09-15 NOTE — Chronic Care Management (AMB) (Signed)
  Chronic Care Management   Outreach Note  09/15/2020 Name: [name] MRN: [id number] DOB: 1946-02-21  Referred by: [name], MD Reason for referral : No chief complaint on file.   Third unsuccessful telephone outreach was attempted today. The patient was referred to the pharmacist for assistance with care management and care coordination.   Follow Up Plan:   Tatjana Dellinger Upstream Scheduler

## 2020-09-20 ENCOUNTER — Other Ambulatory Visit (HOSPITAL_COMMUNITY): Payer: Self-pay | Admitting: Neurosurgery

## 2020-09-20 ENCOUNTER — Other Ambulatory Visit: Payer: Self-pay | Admitting: Neurosurgery

## 2020-09-20 DIAGNOSIS — D352 Benign neoplasm of pituitary gland: Secondary | ICD-10-CM

## 2020-09-21 ENCOUNTER — Ambulatory Visit (INDEPENDENT_AMBULATORY_CARE_PROVIDER_SITE_OTHER): Payer: Medicare Other

## 2020-09-21 ENCOUNTER — Other Ambulatory Visit: Payer: Self-pay

## 2020-09-21 DIAGNOSIS — E538 Deficiency of other specified B group vitamins: Secondary | ICD-10-CM | POA: Diagnosis not present

## 2020-09-21 MED ORDER — CYANOCOBALAMIN 1000 MCG/ML IJ SOLN
1000.0000 ug | Freq: Once | INTRAMUSCULAR | Status: AC
  Administered 2020-09-21: 1000 ug via INTRAMUSCULAR

## 2020-09-21 NOTE — Progress Notes (Signed)
Per orders of Dr. Gutierrez, injection of vit B12 given by Luz Burcher. Patient tolerated injection well.  

## 2020-10-12 ENCOUNTER — Other Ambulatory Visit: Payer: Self-pay

## 2020-10-12 ENCOUNTER — Ambulatory Visit (HOSPITAL_COMMUNITY)
Admission: RE | Admit: 2020-10-12 | Discharge: 2020-10-12 | Disposition: A | Discharge status: Home / Self Care | Payer: Medicare Other | Source: Ambulatory Visit | Attending: Neurosurgery | Admitting: Neurosurgery

## 2020-10-12 DIAGNOSIS — D352 Benign neoplasm of pituitary gland: Secondary | ICD-10-CM | POA: Diagnosis not present

## 2020-10-12 DIAGNOSIS — G9389 Other specified disorders of brain: Secondary | ICD-10-CM | POA: Diagnosis not present

## 2020-10-12 MED ORDER — GADOBUTROL 1 MMOL/ML IV SOLN
10.0000 mL | Freq: Once | INTRAVENOUS | Status: AC | PRN
  Administered 2020-10-12: 10 mL via INTRAVENOUS

## 2020-11-02 ENCOUNTER — Other Ambulatory Visit: Payer: Self-pay

## 2020-11-02 ENCOUNTER — Ambulatory Visit (INDEPENDENT_AMBULATORY_CARE_PROVIDER_SITE_OTHER): Payer: Medicare Other

## 2020-11-02 DIAGNOSIS — E538 Deficiency of other specified B group vitamins: Secondary | ICD-10-CM | POA: Diagnosis not present

## 2020-11-02 MED ORDER — CYANOCOBALAMIN 1000 MCG/ML IJ SOLN
1000.0000 ug | Freq: Once | INTRAMUSCULAR | Status: AC
  Administered 2020-11-02: 1000 ug via INTRAMUSCULAR

## 2020-11-02 NOTE — Progress Notes (Signed)
Patient presented for B 12 injection given by Mcadoo Muzquiz, CMA to left deltoid, patient voiced no concerns nor showed any signs of distress during injection.  

## 2020-11-23 ENCOUNTER — Ambulatory Visit: Payer: Medicare Other | Admitting: Psychiatry

## 2020-12-07 ENCOUNTER — Other Ambulatory Visit: Payer: Self-pay

## 2020-12-07 ENCOUNTER — Ambulatory Visit (INDEPENDENT_AMBULATORY_CARE_PROVIDER_SITE_OTHER): Payer: Medicare Other

## 2020-12-07 DIAGNOSIS — E538 Deficiency of other specified B group vitamins: Secondary | ICD-10-CM

## 2020-12-07 MED ORDER — CYANOCOBALAMIN 1000 MCG/ML IJ SOLN
1000.0000 ug | Freq: Once | INTRAMUSCULAR | Status: AC
  Administered 2020-12-07: 1000 ug via INTRAMUSCULAR

## 2020-12-07 NOTE — Progress Notes (Signed)
Per orders of Dr. [name], injection of B12 was given by Ophelia Shoulder CMA. Patient tolerated injection well.

## 2020-12-14 ENCOUNTER — Encounter: Payer: Self-pay | Admitting: Family Medicine

## 2020-12-16 NOTE — Telephone Encounter (Signed)
Appt has been changed to VV.

## 2020-12-16 NOTE — Telephone Encounter (Signed)
Please change this to a virtual visit.  MyChart message sent to patient.  Thanks.

## 2020-12-19 ENCOUNTER — Encounter: Payer: Self-pay | Admitting: Family Medicine

## 2020-12-19 ENCOUNTER — Other Ambulatory Visit: Payer: Self-pay

## 2020-12-19 ENCOUNTER — Telehealth (INDEPENDENT_AMBULATORY_CARE_PROVIDER_SITE_OTHER): Payer: Medicare Other | Admitting: Family Medicine

## 2020-12-19 ENCOUNTER — Telehealth: Payer: Self-pay

## 2020-12-19 VITALS — HR 67 | Ht 70.0 in | Wt 272.0 lb

## 2020-12-19 DIAGNOSIS — E1149 Type 2 diabetes mellitus with other diabetic neurological complication: Secondary | ICD-10-CM

## 2020-12-19 DIAGNOSIS — E538 Deficiency of other specified B group vitamins: Secondary | ICD-10-CM | POA: Diagnosis not present

## 2020-12-19 MED ORDER — CYANOCOBALAMIN 1000 MCG/ML IJ SOLN: 1000.0000 ug | INTRAMUSCULAR | Status: DC

## 2020-12-19 NOTE — Progress Notes (Signed)
This visit occurred during the SARS-CoV-2 public health emergency.  Safety protocols were in place, including screening questions prior to the visit, additional usage of staff PPE, and extensive cleaning of exam room while observing appropriate contact time as indicated for disinfecting solutions.  Virtual visit completed through WebEx or similar program Patient location: home  Provider location: [place] [facility], office  Participants: Patient and me (unless stated otherwise below)  Pandemic considerations d/w pt.   Limitations and rationale for visit method d/w patient.  Patient agreed to proceed.   CC: DM  HPI:  Diabetes:  Using medications without difficulties: yes Hypoglycemic episodes: very rare- only if prolonged fasting.  Cautions d/w pt.   Hyperglycemic episodes:no Feet problems: still with pain in the B feet.  Burning/tingling.  Still on lyrica, that helped some.  No ADE on med.  D/w pt about inc to '50mg'$  in the AM and '100mg'$  in the PM with lyrica but reasonable to try more frequently B12 replacement.   Blood Sugars averaging: ~120-140s.  Rarely higher.   eye exam within last year: yes A1c pending.  We'll check about getting A1c done at [facility].    He notes that foot pain is better about 1 week after the the B12 shots.  D/w pt about B12 shot every 14 days for 3 months and he'll update me at that point.    Meds and allergies reviewed.   ROS: Per HPI unless specifically indicated in ROS section   NAD Speech wnl  A/P: Diabetes. A1c pending.  We'll check about getting A1c done at [facility].  We will get the results and go from there.  Hypoglycemia cautions discussed with patient.  In the meantime continue glipizide and metformin.  B12 deficiency.  He notes that foot pain is better about 1 week after the the B12 shots.  D/w pt about B12 shot every 14 days for 3 months and he'll update me at that point.

## 2020-12-19 NOTE — Telephone Encounter (Signed)
Spoke with Beatriz Stallion, CMA about scheduling patient for lab and B12 injection at the same time in [place] office. Please call patient to get him set up when able.

## 2020-12-19 NOTE — Telephone Encounter (Signed)
-----   Message from [name], MD sent at 12/19/2020 11:12 AM EDT ----- Please see about about scheduling B12 and A1c concurrently at [facility].   Needs to change to q14day B12 injection for 3 months.    Thanks.   Brigitte Pulse

## 2020-12-21 ENCOUNTER — Encounter: Payer: Self-pay | Admitting: Psychiatry

## 2020-12-21 ENCOUNTER — Telehealth (INDEPENDENT_AMBULATORY_CARE_PROVIDER_SITE_OTHER): Payer: Medicare Other | Admitting: Psychiatry

## 2020-12-21 DIAGNOSIS — F331 Major depressive disorder, recurrent, moderate: Secondary | ICD-10-CM

## 2020-12-21 DIAGNOSIS — F4310 Post-traumatic stress disorder, unspecified: Secondary | ICD-10-CM | POA: Diagnosis not present

## 2020-12-21 DIAGNOSIS — F330 Major depressive disorder, recurrent, mild: Secondary | ICD-10-CM | POA: Diagnosis not present

## 2020-12-21 MED ORDER — FLUOXETINE HCL 20 MG PO CAPS: 40.0000 mg | ORAL_CAPSULE | Freq: Every day | ORAL | 3 refills | Status: DC

## 2020-12-21 NOTE — Assessment & Plan Note (Signed)
A1c pending.  We'll check about getting A1c done at [facility].  We will get the results and go from there.  Hypoglycemia cautions discussed with patient.  In the meantime continue glipizide and metformin.

## 2020-12-21 NOTE — Assessment & Plan Note (Signed)
  B12 deficiency.  He notes that foot pain is better about 1 week after the the B12 shots.  D/w pt about B12 shot every 14 days for 3 months and he'll update me at that point.

## 2020-12-21 NOTE — Progress Notes (Signed)
[name] [id number] 02/24/1946 75 y.o.    Subjective:   Patient ID:  [name] is a 75 y.o. (DOB Dec 03, 1945) male.  Chief Complaint:  Chief Complaint  Patient presents with   Follow-up   Depression   Anxiety    Depression        Associated symptoms include no decreased concentration and no suicidal ideas. [name] presents to the office today for follow-up of depression and anxiety.  He has been under my care for many years, 54.  On Prozac all those years.    Last seen Dec 2020 without med changes and the folllowing noted: Served custody papers with F trying to get GD.  Left to live with her F in July.  Depressing time for me but handled it.  Had a supportive friends which helped.  Read [name]'s book which helped.  [place] with it.  She's 74 yo.  Still loves them and sees them.  03/22/20 appt with following noted: Last half of the year not good.  Early June lost dear friend unexpectedly.  Late 01-09-2023 only brother died.  Noticed more depression after brother's death.   Wonders about increased fluoxetine from 30 without SE.  Tended to lay in bed longer than he should.  Less motivation and energy and enjoyment. Mild irritability. 74 yo GD doing well in driving and school. Plan: Agree to increase fluoxetine to 40 mg daily.  05/26/20 appt noted: Saw benefit with anxiety after increase fluoxetine and feels better physically.  No SE. Better interest and enjoyment with things.  Not sig depressed now.  More productive.  W noticed he's better. Cardiology said he was boring.  Lost 20# last year.   B was last sibling.  12/21/20 appt noted: GD moved back in with them and F giving up custody.  F agreed for her to move back.  With added stress continuing this dose is desired.  Going well with GD.  She's in counseling.  She's doing well with school.  Less irritable with increase fluoxetine to 40 mg daily and wife notices.  Patient reports stable mood and denies depressed or irritable  moods.  Patient denies any recent difficulty with anxiety.  Patient denies difficulty with sleep initiation or maintenance. RLS managed with ropinirole.  Denies appetite disturbance.  Patient reports that energy and motivation have been good.  Patient denies any difficulty with concentration.  Patient denies any suicidal ideation.  Feels so much better after ablation for VT.   Marland KitchenPast Psychiatric Medication Trials: Zoloft, Trazodone, fluoxetine 30  Review of Systems:  Review of Systems  Cardiovascular:  Negative for palpitations.  Musculoskeletal:  Positive for arthralgias and gait problem.  Neurological:  Positive for weakness. Negative for tremors and numbness.  Psychiatric/Behavioral:  Positive for dysphoric mood. Negative for agitation, behavioral problems, confusion, decreased concentration, hallucinations, self-injury, sleep disturbance and suicidal ideas. The patient is not nervous/anxious and is not hyperactive.    Medications: I have reviewed the patient's current medications.  Current Outpatient Medications  Medication Sig Dispense Refill   Accu-Chek FastClix Lancets MISC Use as directed to check blood sugars. DX. E11.9 102 each 3   ACCU-CHEK GUIDE test strip TEST SUGAR ONCE DAILY DIAGNOSIS: E11.49 NON INSULIN DEPENDENT. 100 strip 3   Blood Glucose Calibration (ACCU-CHEK GUIDE CONTROL) LIQD USE AS DIRECTED 1 each 0   Blood Glucose Monitoring Suppl (ACCU-CHEK GUIDE) w/Device KIT 1 kit by In Vitro route daily. 1 kit 0   Calcium Citrate-Vitamin D (CITRACAL +  D PO) Take 1 tablet by mouth daily.      cetirizine (ZYRTEC) 10 MG tablet Take 10 mg by mouth at bedtime.     Cholecalciferol (VITAMIN D3) 5000 UNITS TABS Take 5,000 Units every morning by mouth.      cyanocobalamin (,VITAMIN B-12,) 1000 MCG/ML injection Inject 1 mL (1,000 mcg total) into the muscle every 14 (fourteen) days. 1 mL    ELIQUIS 5 MG TABS tablet Take 1 tablet (5 mg total) by mouth 2 (two) times daily. 180 tablet 3    fluticasone (FLONASE) 50 MCG/ACT nasal spray Place 2 sprays into both nostrils daily as needed for rhinitis.      glipiZIDE (GLUCOTROL) 5 MG tablet Take 1 tablet (5 mg total) by mouth daily before breakfast. 90 tablet 3   lisinopril (ZESTRIL) 2.5 MG tablet Take 1 tablet (2.5 mg total) by mouth every evening. 90 tablet 3   MELATONIN GUMMIES PO Take 2 tablets by mouth at bedtime. CHEW     metFORMIN (GLUCOPHAGE-XR) 750 MG 24 hr tablet Take 1 tablet (750 mg total) by mouth 2 (two) times daily. 180 tablet 3   Multiple Vitamins-Minerals (PRESERVISION AREDS 2) CAPS Take 1 capsule by mouth 2 (two) times daily.     OTEZLA 30 MG TABS TAKE 1 TABLET BY MOUTH TWICE DAILY 180 tablet 0   pravastatin (PRAVACHOL) 80 MG tablet Take 80 mg by mouth daily.     pregabalin (LYRICA) 50 MG capsule Take 50 mg by mouth 2 (two) times daily.     rOPINIRole (REQUIP) 1 MG tablet Take 1 tablet (1 mg total) by mouth at bedtime. 90 tablet 3   FLUoxetine (PROZAC) 20 MG capsule Take 2 capsules (40 mg total) by mouth daily. 180 capsule 3   No current facility-administered medications for this visit.    Medication Side Effects: None  Allergies:  Allergies  Allergen Reactions   Cheese Anaphylaxis    Bacteria in aged cheeses cause Anaphylactic reaction Patient can tolerate cheese that is not aged, such as ricotta, cream cheese and cottage cheese   Verapamil Shortness Of Breath and Other (See Comments)    CP, irregular/slow HR, dizziness, heartburn, drowsiness, weakness   Zithromax [Azithromycin Dihydrate] Swelling    Swelling (arms/legs/scrotum)   Oxycodone Itching and Other (See Comments)    Sweats and itching- tolerates hydrocodone   Acyclovir And Related Other (See Comments)    Told by MD   Amiodarone Other (See Comments)    Acute lung toxicity   Arava [Leflunomide]     Lack of effect   Latex Itching   Methotrexate Derivatives     Intolerant- chills, aches, diarrhea.     Adhesive [Tape] Itching and Rash    Amlodipine Other (See Comments)    Muscle pain    Past Medical History:  Diagnosis Date   Allergic rhinitis    Anticoagulant long-term use    eliquis   Anxiety    Arthritis    WRISTS, KNEES, ANKLES   CAD (coronary artery disease) CARDIOLOGIST-  DR GREGG TAYLOR   Nonobstructive CAD by cath 2006;  HEART CATH AGAIN ON 06/08/13 AFTER CHEST DISCOMFORT / ADMISSION TO [place] - "MILD NON-OBSTRUCTIVE CAD, NORMAL LV SYSTOLIC FUNCTION"   Depression    Diastolic CHF ([place]) dx 0/3546--- cardiologist-  dr gregg taylor   EF 50-55%  per echo 05/2016   GERD (gastroesophageal reflux disease)    H/O cardiac radiofrequency ablation    History of kidney stones    HTN (hypertension)  Hyperlipidemia    OSA treated with BiPAP followed dr Elsworth Soho (previously dr clance)   per study 02-04-2012 very severe osa AHI 90/hr--  currently uses Bi-Pap every night per pt    Paroxysmal VT [facility]) cardiologist--  dr Carleene Overlie taylor   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 Estanislado Pandy   PTSD (post-traumatic stress disorder)    Restless leg syndrome    Stroke ([place])    Type 2 diabetes mellitus ([place])    Vertigo    dx by Dr. [name] per patient     Family History  Problem Relation Age of Onset   Sudden death Father 80   Heart disease Father        MI at 63   Heart disease Brother        stents and PPM   Parkinson's disease Brother    Cancer Mother        benign tumor, died from surgery complications   Other Mother        pituitary adenoma   Heart disease Brother    Cancer Brother        multiple myeloma   Sudden death Paternal Grandmother 7   Sudden death Paternal Uncle 36   Heart disease Paternal Uncle    Heart  disease Maternal Uncle    COPD Paternal Uncle    Colon cancer Neg Hx    Prostate cancer Neg Hx     Social History   Socioeconomic History   Marital status: Married    Spouse name: Not on file   Number of children: 1   Years of education: Not on file   Highest education level: Not on file  Occupational History   Occupation: retired    Comment: Designer, television/film set  Tobacco Use   Smoking status: Former    Packs/day: 3.00    Years: 2.00    Pack years: 6.00    Types: Cigarettes    Quit date: 08/21/1980    Years since quitting: 40.3   Smokeless tobacco: Never  Vaping Use   Vaping Use: Never used  Substance and Sexual Activity   Alcohol use: Yes    Alcohol/week: 0.0 standard drinks    Comment: occasional   Drug use: No   Sexual activity: Not Currently  Other Topics Concern   Not on file  Social History Narrative   Married 1971   [employer] grad   1 daughter   Pt's granddaughter lives with her father.    Retired as Designer, television/film set and nonprofit/financial work with [employer] Pulmonary (10/23/16):   Originally from [place] [facility]. Previously lived in [place] shortly. Previously was a [employer]. He worked mostly with non-profit groups. Does have a cat currently. Remote bird exposure. No hot tub or mold exposure. Remote travel to [place], [place], [place], & [place].    Social Determinants of Health   Financial Resource Strain: Not on file  Food Insecurity: Not on file  Transportation Needs: Not on file  Physical Activity: Not on file  Stress: Not on file  Social Connections: Not on file  Intimate Partner Violence: Not on file  Past Medical History, Surgical history, Social history, and Family history were reviewed and updated as appropriate.   Please see review of systems for further details on the patient's review from today.   Objective:   Physical Exam:  There were no vitals taken for this visit.  Physical Exam Neurological:     Mental Status: He  is alert and oriented to person, place, and time.     Cranial Nerves: No dysarthria.  Psychiatric:        Attention and Perception: Attention normal.        Mood and Affect: Mood is not anxious or depressed.        Speech: Speech normal.        Behavior: Behavior is not slowed. Behavior is cooperative.        Thought Content: Thought content normal. Thought content is not paranoid or delusional. Thought content does not include homicidal or suicidal ideation. Thought content does not include homicidal or suicidal plan.        Cognition and Memory: Cognition and memory normal.        Judgment: Judgment normal.    Lab Review:     Component Value Date/Time   NA 142 06/02/2020 0943   NA 140 11/11/2019 1418   NA 139 09/03/2014 1039   K 4.1 06/02/2020 0943   K 4.3 09/03/2014 1039   CL 106 06/02/2020 0943   CO2 27 06/02/2020 0943   CO2 23 09/03/2014 1039   GLUCOSE 136 (H) 06/02/2020 0943   GLUCOSE 156 (H) 09/03/2014 1039   BUN 27 (H) 06/02/2020 0943   BUN 22 11/11/2019 1418   BUN 23.0 09/03/2014 1039   CREATININE 1.14 06/02/2020 0943   CREATININE 1.10 02/11/2020 1441   CREATININE 1.0 09/03/2014 1039   CALCIUM 9.7 06/02/2020 0943   CALCIUM 9.3 09/03/2014 1039   PROT 6.9 06/02/2020 0943   PROT 7.2 11/11/2019 1418   PROT 7.5 09/03/2014 1039   ALBUMIN 3.9 06/02/2020 0943   ALBUMIN 4.0 11/11/2019 1418   ALBUMIN 3.5 09/03/2014 1039   AST 13 06/02/2020 0943   AST 15 09/03/2014 1039   ALT 15 06/02/2020 0943   ALT 14 09/03/2014 1039   ALKPHOS 99 06/02/2020 0943   ALKPHOS 125 09/03/2014 1039   BILITOT 0.6 06/02/2020 0943   BILITOT 0.6 11/11/2019 1418   BILITOT 1.10 09/03/2014 1039   GFRNONAA 66 02/11/2020 1441   GFRAA 77 02/11/2020 1441       Component Value Date/Time   WBC 8.3 06/02/2020 0943   RBC 3.64 (L) 06/02/2020 0943   HGB 11.4 (L) 06/02/2020 0943   HGB 11.9 (L) 11/11/2019 1418   HGB 12.3 (L) 09/03/2014 1037   HCT 33.5 (L) 06/02/2020 0943   HCT 34.6 (L) 11/11/2019  1418   HCT 36.1 (L) 09/03/2014 1037   PLT 206.0 06/02/2020 0943   PLT 247 11/11/2019 1418   MCV 91.8 06/02/2020 0943   MCV 93 11/11/2019 1418   MCV 88.6 09/03/2014 1037   MCH 30.8 02/11/2020 1441   MCHC 34.2 06/02/2020 0943   RDW 14.7 06/02/2020 0943   RDW 12.9 11/11/2019 1418   RDW 13.9 09/03/2014 1037   LYMPHSABS 1.6 06/02/2020 0943   LYMPHSABS 1.5 11/11/2019 1418   LYMPHSABS 1.2 09/03/2014 1037   MONOABS 0.6 06/02/2020 0943   MONOABS 0.4 09/03/2014 1037   EOSABS 0.3 06/02/2020 0943   EOSABS 0.4 11/11/2019 1418   BASOSABS 0.1 06/02/2020 0943   BASOSABS 0.1 11/11/2019 1418   BASOSABS  0.1 09/03/2014 1037    No results found for: POCLITH, LITHIUM   No results found for: PHENYTOIN, PHENOBARB, VALPROATE, CBMZ   .res Assessment: Plan:    Mild recurrent major depression ([place])  PTSD (post-traumatic stress disorder) - Plan: FLUoxetine (PROZAC) 20 MG capsule  Major depressive disorder, recurrent episode, moderate ([facility]) - Plan: FLUoxetine (PROZAC) 20 MG capsule   Supportive therapy over situation with GD.  Encourage activity. And exercise.  continiue fluoxetine to 40 mg daily. More even keeled after the increase.    FU 12 mos  Lynder Parents, MD, DFAPA   Please see After Visit Summary for patient specific instructions.  Future Appointments  Date Time Provider [place]  02/16/2021  1:00 PM Ofilia Neas, PA-C [facility] None    No orders of the defined types were placed in this encounter.     -------------------------------

## 2020-12-22 ENCOUNTER — Encounter: Payer: Self-pay | Admitting: Family Medicine

## 2020-12-22 NOTE — Telephone Encounter (Signed)
LMTCB to schedule lab and B12

## 2020-12-22 NOTE — Telephone Encounter (Signed)
Mychart sent to schedule.

## 2020-12-27 ENCOUNTER — Other Ambulatory Visit: Payer: Medicare Other

## 2020-12-28 DIAGNOSIS — H35372 Puckering of macula, left eye: Secondary | ICD-10-CM | POA: Diagnosis not present

## 2020-12-28 LAB — HM DIABETES EYE EXAM

## 2020-12-29 ENCOUNTER — Ambulatory Visit (INDEPENDENT_AMBULATORY_CARE_PROVIDER_SITE_OTHER): Payer: Medicare Other

## 2020-12-29 ENCOUNTER — Other Ambulatory Visit (INDEPENDENT_AMBULATORY_CARE_PROVIDER_SITE_OTHER): Payer: Medicare Other

## 2020-12-29 ENCOUNTER — Other Ambulatory Visit: Payer: Self-pay

## 2020-12-29 DIAGNOSIS — E1149 Type 2 diabetes mellitus with other diabetic neurological complication: Secondary | ICD-10-CM

## 2020-12-29 DIAGNOSIS — Z23 Encounter for immunization: Secondary | ICD-10-CM | POA: Diagnosis not present

## 2020-12-29 DIAGNOSIS — E538 Deficiency of other specified B group vitamins: Secondary | ICD-10-CM

## 2020-12-29 LAB — POCT GLYCOSYLATED HEMOGLOBIN (HGB A1C): Hemoglobin A1C: 5.8 % — AB (ref 4.0–5.6)

## 2020-12-29 MED ORDER — CYANOCOBALAMIN 1000 MCG/ML IJ SOLN
1000.0000 ug | Freq: Once | INTRAMUSCULAR | Status: AC
  Administered 2020-12-29: 1000 ug via INTRAMUSCULAR

## 2020-12-29 NOTE — Progress Notes (Signed)
Per orders of Dr. [name], injection of every 2 weeks B12 1000 mcg/ml given by Pilar Grammes, CMA in Left Deltoid. Patient tolerated injection well.

## 2020-12-29 NOTE — Progress Notes (Signed)
8

## 2021-01-01 ENCOUNTER — Other Ambulatory Visit: Payer: Self-pay | Admitting: Family Medicine

## 2021-01-12 ENCOUNTER — Ambulatory Visit (INDEPENDENT_AMBULATORY_CARE_PROVIDER_SITE_OTHER): Payer: Medicare Other

## 2021-01-12 ENCOUNTER — Other Ambulatory Visit: Payer: Self-pay

## 2021-01-12 DIAGNOSIS — E538 Deficiency of other specified B group vitamins: Secondary | ICD-10-CM | POA: Diagnosis not present

## 2021-01-12 MED ORDER — CYANOCOBALAMIN 1000 MCG/ML IJ SOLN
1000.0000 ug | Freq: Once | INTRAMUSCULAR | Status: AC
  Administered 2021-01-12: 1000 ug via INTRAMUSCULAR

## 2021-01-12 NOTE — Progress Notes (Addendum)
Per orders of Dr. [name], injection of every 14 days B12 1000 mcg/ml given by Pilar Grammes, CMA in Left Deltoid. Patient tolerated injection well.

## 2021-01-26 ENCOUNTER — Ambulatory Visit: Payer: Medicare Other

## 2021-02-02 ENCOUNTER — Ambulatory Visit (INDEPENDENT_AMBULATORY_CARE_PROVIDER_SITE_OTHER): Payer: Medicare Other

## 2021-02-02 ENCOUNTER — Other Ambulatory Visit: Payer: Self-pay

## 2021-02-02 DIAGNOSIS — E538 Deficiency of other specified B group vitamins: Secondary | ICD-10-CM | POA: Diagnosis not present

## 2021-02-02 MED ORDER — CYANOCOBALAMIN 1000 MCG/ML IJ SOLN
1000.0000 ug | Freq: Once | INTRAMUSCULAR | Status: AC
  Administered 2021-02-02: 1000 ug via INTRAMUSCULAR

## 2021-02-02 NOTE — Progress Notes (Addendum)
Per orders of Dr. [name], injection of every 2 weeks B12 1000 mcg/ml given by Pilar Grammes, CMA in Left Deltoid. Patient tolerated injection well.
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
## 2021-02-04 ENCOUNTER — Other Ambulatory Visit: Payer: Self-pay | Admitting: Family Medicine

## 2021-02-16 ENCOUNTER — Ambulatory Visit: Payer: Self-pay

## 2021-02-16 ENCOUNTER — Ambulatory Visit (INDEPENDENT_AMBULATORY_CARE_PROVIDER_SITE_OTHER): Payer: Medicare Other

## 2021-02-16 ENCOUNTER — Ambulatory Visit (INDEPENDENT_AMBULATORY_CARE_PROVIDER_SITE_OTHER): Payer: Medicare Other | Admitting: Physician Assistant

## 2021-02-16 ENCOUNTER — Other Ambulatory Visit: Payer: Self-pay

## 2021-02-16 ENCOUNTER — Encounter: Payer: Self-pay | Admitting: Physician Assistant

## 2021-02-16 VITALS — BP 149/80 | HR 76 | Resp 12 | Ht 69.5 in | Wt 274.0 lb

## 2021-02-16 DIAGNOSIS — Z8679 Personal history of other diseases of the circulatory system: Secondary | ICD-10-CM

## 2021-02-16 DIAGNOSIS — L409 Psoriasis, unspecified: Secondary | ICD-10-CM

## 2021-02-16 DIAGNOSIS — E538 Deficiency of other specified B group vitamins: Secondary | ICD-10-CM | POA: Diagnosis not present

## 2021-02-16 DIAGNOSIS — M19042 Primary osteoarthritis, left hand: Secondary | ICD-10-CM

## 2021-02-16 DIAGNOSIS — Z79899 Other long term (current) drug therapy: Secondary | ICD-10-CM

## 2021-02-16 DIAGNOSIS — Z8669 Personal history of other diseases of the nervous system and sense organs: Secondary | ICD-10-CM | POA: Diagnosis not present

## 2021-02-16 DIAGNOSIS — I472 Ventricular tachycardia, unspecified: Secondary | ICD-10-CM

## 2021-02-16 DIAGNOSIS — M19072 Primary osteoarthritis, left ankle and foot: Secondary | ICD-10-CM

## 2021-02-16 DIAGNOSIS — M19041 Primary osteoarthritis, right hand: Secondary | ICD-10-CM

## 2021-02-16 DIAGNOSIS — Z87448 Personal history of other diseases of urinary system: Secondary | ICD-10-CM

## 2021-02-16 DIAGNOSIS — Z8639 Personal history of other endocrine, nutritional and metabolic disease: Secondary | ICD-10-CM | POA: Diagnosis not present

## 2021-02-16 DIAGNOSIS — L4050 Arthropathic psoriasis, unspecified: Secondary | ICD-10-CM

## 2021-02-16 DIAGNOSIS — Z96653 Presence of artificial knee joint, bilateral: Secondary | ICD-10-CM | POA: Diagnosis not present

## 2021-02-16 DIAGNOSIS — M722 Plantar fascial fibromatosis: Secondary | ICD-10-CM

## 2021-02-16 DIAGNOSIS — G2581 Restless legs syndrome: Secondary | ICD-10-CM

## 2021-02-16 DIAGNOSIS — M19071 Primary osteoarthritis, right ankle and foot: Secondary | ICD-10-CM | POA: Diagnosis not present

## 2021-02-16 MED ORDER — CYANOCOBALAMIN 1000 MCG/ML IJ SOLN
1000.0000 ug | Freq: Once | INTRAMUSCULAR | Status: AC
  Administered 2021-02-16: 11:00:00 1000 ug via INTRAMUSCULAR

## 2021-02-16 NOTE — Progress Notes (Signed)
Please notify the patient that there has been no radiographic progression in x-rays of both hands and feet since 2020.  Findings consistent with OA and PsA overlap.

## 2021-02-16 NOTE — Progress Notes (Signed)
Per orders of Dr. [name], injection of every 2 weeks B12 1000 mcg/ml injection given by Pilar Grammes, CMA in Right Deltoid. Patient tolerated injection well.

## 2021-02-17 ENCOUNTER — Other Ambulatory Visit: Payer: Self-pay | Admitting: Family Medicine

## 2021-02-17 DIAGNOSIS — I10 Essential (primary) hypertension: Secondary | ICD-10-CM

## 2021-02-17 DIAGNOSIS — E119 Type 2 diabetes mellitus without complications: Secondary | ICD-10-CM

## 2021-02-17 DIAGNOSIS — Z125 Encounter for screening for malignant neoplasm of prostate: Secondary | ICD-10-CM

## 2021-02-17 DIAGNOSIS — E538 Deficiency of other specified B group vitamins: Secondary | ICD-10-CM

## 2021-02-17 DIAGNOSIS — E559 Vitamin D deficiency, unspecified: Secondary | ICD-10-CM

## 2021-02-17 DIAGNOSIS — D638 Anemia in other chronic diseases classified elsewhere: Secondary | ICD-10-CM

## 2021-02-17 LAB — CBC WITH DIFFERENTIAL/PLATELET
Absolute Monocytes: 451 cells/uL (ref 200–950)
Basophils Absolute: 34 cells/uL (ref 0–200)
Basophils Relative: 0.4 %
Eosinophils Absolute: 238 cells/uL (ref 15–500)
Eosinophils Relative: 2.8 %
HCT: 30.8 % — ABNORMAL LOW (ref 38.5–50.0)
Hemoglobin: 10.3 g/dL — ABNORMAL LOW (ref 13.2–17.1)
Lymphs Abs: 1148 cells/uL (ref 850–3900)
MCH: 30.1 pg (ref 27.0–33.0)
MCHC: 33.4 g/dL (ref 32.0–36.0)
MCV: 90.1 fL (ref 80.0–100.0)
MPV: 10.7 fL (ref 7.5–12.5)
Monocytes Relative: 5.3 %
Neutro Abs: 6630 cells/uL (ref 1500–7800)
Neutrophils Relative %: 78 %
Platelets: 257 10*3/uL (ref 140–400)
RBC: 3.42 10*6/uL — ABNORMAL LOW (ref 4.20–5.80)
RDW: 12.9 % (ref 11.0–15.0)
Total Lymphocyte: 13.5 %
WBC: 8.5 10*3/uL (ref 3.8–10.8)

## 2021-02-17 LAB — COMPLETE METABOLIC PANEL WITH GFR
AG Ratio: 1.2 (calc) (ref 1.0–2.5)
ALT: 11 U/L (ref 9–46)
AST: 11 U/L (ref 10–35)
Albumin: 3.7 g/dL (ref 3.6–5.1)
Alkaline phosphatase (APISO): 124 U/L (ref 35–144)
BUN: 14 mg/dL (ref 7–25)
CO2: 25 mmol/L (ref 20–32)
Calcium: 9 mg/dL (ref 8.6–10.3)
Chloride: 107 mmol/L (ref 98–110)
Creat: 1.15 mg/dL (ref 0.70–1.28)
Globulin: 3.2 g/dL (calc) (ref 1.9–3.7)
Glucose, Bld: 158 mg/dL — ABNORMAL HIGH (ref 65–99)
Potassium: 3.3 mmol/L — ABNORMAL LOW (ref 3.5–5.3)
Sodium: 143 mmol/L (ref 135–146)
Total Bilirubin: 0.7 mg/dL (ref 0.2–1.2)
Total Protein: 6.9 g/dL (ref 6.1–8.1)
eGFR: 67 mL/min/{1.73_m2} (ref >=60)

## 2021-02-17 LAB — SEDIMENTATION RATE: Sed Rate: 48 mm/h — ABNORMAL HIGH (ref 0–20)

## 2021-02-17 LAB — C-REACTIVE PROTEIN: CRP: 27.8 mg/L — ABNORMAL HIGH (ref <8.0)

## 2021-02-17 NOTE — Progress Notes (Signed)
Anemia noted.  Please advise patient to take multivitamin with iron.  Glucose is elevated.  Potassium is low.  Sed rate is elevated CRP is elevated which indicates active disease.

## 2021-02-20 ENCOUNTER — Telehealth: Payer: Self-pay | Admitting: Pharmacist

## 2021-02-20 ENCOUNTER — Encounter: Payer: Self-pay | Admitting: Rheumatology

## 2021-02-20 NOTE — Telephone Encounter (Signed)
Patient scheduled for 02/21/2021 at 11:00 am.

## 2021-02-20 NOTE — Telephone Encounter (Signed)
Received provider portion of patient's Otezla PAP renewal application through [employer]. Signed provider form, med list, and insurance card copy placed in "PAP pending info" folder in pharmacy office.  ATC patient to review if he plans to submit his part of application on his own. Unable to reach but left VM requesting return call for clarification prior to submitting provider portion  [name], PharmD, MPH, BCPS Clinical Pharmacist (Rheumatology and Pulmonology)

## 2021-02-20 NOTE — Telephone Encounter (Signed)
I returned the patient's call for further details regarding the symptoms he is experiencing.  According to the patient he started to have increased pain at the back of his left heel extending through his Achilles tendon starting last night.  He states that he did not have any injury or change in activity yesterday to trigger his symptoms.  He states he has noticed some swelling in the left ankle.  He denies any discoloration or rash.  He states he is having difficulty bearing weight due to the pain in the back of his heel.  He states that his symptoms due to neuropathy are unchanged.  He tried taking ibuprofen as well as applying Biofreeze with some relief.  He has not supposed to take NSAIDs due to being on Eliquis.  His blood sugars have not been well controlled so he is not a good candidate for prednisone for possible cortisone injection. He continues to take [name] as prescribed.  Discussed that he will likely require more aggressive treatment specially since his sed rate and CRP were elevated with his most recent lab work.  Please call the patient to fit him in to be seen tomorrow morning or Wednesday morning to discuss treatment options while [name], [facility] is here.
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
## 2021-02-21 ENCOUNTER — Encounter: Payer: Self-pay | Admitting: Physician Assistant

## 2021-02-21 ENCOUNTER — Ambulatory Visit (INDEPENDENT_AMBULATORY_CARE_PROVIDER_SITE_OTHER): Payer: Medicare Other | Admitting: Physician Assistant

## 2021-02-21 ENCOUNTER — Telehealth: Payer: Self-pay | Admitting: Pharmacist

## 2021-02-21 ENCOUNTER — Other Ambulatory Visit: Payer: Self-pay

## 2021-02-21 VITALS — BP 170/83 | HR 73 | Resp 12 | Ht 69.5 in | Wt 270.2 lb

## 2021-02-21 DIAGNOSIS — Z8639 Personal history of other endocrine, nutritional and metabolic disease: Secondary | ICD-10-CM

## 2021-02-21 DIAGNOSIS — Z111 Encounter for screening for respiratory tuberculosis: Secondary | ICD-10-CM | POA: Diagnosis not present

## 2021-02-21 DIAGNOSIS — M19072 Primary osteoarthritis, left ankle and foot: Secondary | ICD-10-CM

## 2021-02-21 DIAGNOSIS — L409 Psoriasis, unspecified: Secondary | ICD-10-CM

## 2021-02-21 DIAGNOSIS — M5441 Lumbago with sciatica, right side: Secondary | ICD-10-CM

## 2021-02-21 DIAGNOSIS — M722 Plantar fascial fibromatosis: Secondary | ICD-10-CM

## 2021-02-21 DIAGNOSIS — Z87448 Personal history of other diseases of urinary system: Secondary | ICD-10-CM

## 2021-02-21 DIAGNOSIS — Z7901 Long term (current) use of anticoagulants: Secondary | ICD-10-CM | POA: Diagnosis not present

## 2021-02-21 DIAGNOSIS — M19042 Primary osteoarthritis, left hand: Secondary | ICD-10-CM | POA: Diagnosis not present

## 2021-02-21 DIAGNOSIS — M19071 Primary osteoarthritis, right ankle and foot: Secondary | ICD-10-CM

## 2021-02-21 DIAGNOSIS — Z79899 Other long term (current) drug therapy: Secondary | ICD-10-CM

## 2021-02-21 DIAGNOSIS — M533 Sacrococcygeal disorders, not elsewhere classified: Secondary | ICD-10-CM

## 2021-02-21 DIAGNOSIS — G8929 Other chronic pain: Secondary | ICD-10-CM

## 2021-02-21 DIAGNOSIS — I472 Ventricular tachycardia, unspecified: Secondary | ICD-10-CM

## 2021-02-21 DIAGNOSIS — Z8679 Personal history of other diseases of the circulatory system: Secondary | ICD-10-CM

## 2021-02-21 DIAGNOSIS — G2581 Restless legs syndrome: Secondary | ICD-10-CM

## 2021-02-21 DIAGNOSIS — M19041 Primary osteoarthritis, right hand: Secondary | ICD-10-CM

## 2021-02-21 DIAGNOSIS — Z96653 Presence of artificial knee joint, bilateral: Secondary | ICD-10-CM | POA: Diagnosis not present

## 2021-02-21 DIAGNOSIS — L4050 Arthropathic psoriasis, unspecified: Secondary | ICD-10-CM

## 2021-02-21 DIAGNOSIS — M7662 Achilles tendinitis, left leg: Secondary | ICD-10-CM | POA: Diagnosis not present

## 2021-02-21 DIAGNOSIS — Z8669 Personal history of other diseases of the nervous system and sense organs: Secondary | ICD-10-CM

## 2021-02-21 MED ORDER — PREDNISONE 5 MG PO TABS: ORAL_TABLET | ORAL | 0 refills | Status: DC

## 2021-02-21 NOTE — Progress Notes (Signed)
Pharmacy Note  Subjective:  Patient presents today to [facility] for follow up office visit.  Patient was seen by the pharmacist for counseling on [place] for psoriatic arthritis and plaque psoriasis..  Prior therapy includes: MTX, [place]rav[place], otezla.  History of inflammatory bowel disease: No  Objective:  CBC    Component Value Date/Time   WBC 8.5 02/16/2021 1332   RBC 3.42 (L) 02/16/2021 1332   HGB 10.3 (L) 02/16/2021 1332   HGB 11.9 (L) 11/11/2019 1418   HGB 12.3 (L) 09/03/2014 1037   HCT 30.8 (L) 02/16/2021 1332   HCT 34.6 (L) 11/11/2019 1418   HCT 36.1 (L) 09/03/2014 1037   PLT 257 02/16/2021 1332   PLT 247 11/11/2019 1418   MCV 90.1 02/16/2021 1332   MCV 93 11/11/2019 1418   MCV 88.6 09/03/2014 1037   MCH 30.1 02/16/2021 1332   MCHC 33.4 02/16/2021 1332   RDW 12.9 02/16/2021 1332   RDW 12.9 11/11/2019 1418   RDW 13.9 09/03/2014 1037   LYMPHSABS 1,148 02/16/2021 1332   LYMPHSABS 1.5 11/11/2019 1418   LYMPHSABS 1.2 09/03/2014 1037   MONOABS 0.6 06/02/2020 0943   MONOABS 0.4 09/03/2014 1037   EOSABS 238 02/16/2021 1332   EOSABS 0.4 11/11/2019 1418   BASOSABS 34 02/16/2021 1332   BASOSABS 0.1 11/11/2019 1418   BASOSABS 0.1 09/03/2014 1037    CMP     Component Value Date/Time   NA 143 02/16/2021 1332   NA 140 11/11/2019 1418   NA 139 09/03/2014 1039   K 3.3 (L) 02/16/2021 1332   K 4.3 09/03/2014 1039   CL 107 02/16/2021 1332   CO2 25 02/16/2021 1332   CO2 23 09/03/2014 1039   GLUCOSE 158 (H) 02/16/2021 1332   GLUCOSE 156 (H) 09/03/2014 1039   BUN 14 02/16/2021 1332   BUN 22 11/11/2019 1418   BUN 23.0 09/03/2014 1039   CREATININE 1.15 02/16/2021 1332   CREATININE 1.0 09/03/2014 1039   CALCIUM 9.0 02/16/2021 1332   CALCIUM 9.3 09/03/2014 1039   PROT 6.9 02/16/2021 1332   PROT 7.2 11/11/2019 1418   PROT 7.5 09/03/2014 1039   ALBUMIN 3.9 06/02/2020 0943   ALBUMIN 4.0 11/11/2019 1418   ALBUMIN 3.5 09/03/2014 1039   AST 11 02/16/2021 1332   AST 15  09/03/2014 1039   ALT 11 02/16/2021 1332   ALT 14 09/03/2014 1039   ALKPHOS 99 06/02/2020 0943   ALKPHOS 125 09/03/2014 1039   BILITOT 0.7 02/16/2021 1332   BILITOT 0.6 11/11/2019 1418   BILITOT 1.10 09/03/2014 1039   GFRNONAA 66 02/11/2020 1441   GFRAA 77 02/11/2020 1441    Baseline Immunosuppressant Therapy Labs  Quantiferon TB Gold Latest Ref Rng & Units 08/10/2019  Quantiferon TB Gold Plus NEGATIVE NEGATIVE    Hepatitis Latest Ref Rng & Units 04/29/2015  Hep C Ab NEGATIVE NEGATIVE    Lab Results  Component Value Date   HIV NON-REACTIVE 08/10/2019   HIV Non Reactive 10/18/2014    Immunoglobulin Electrophoresis Latest Ref Rng & Units 08/10/2019  IgA  70 - 320 mg/dL 481(H)  IgG 600 - 1,540 mg/dL 1,213  IgM 50 - 300 mg/dL 46(L)    Serum Protein Electrophoresis Latest Ref Rng & Units 02/16/2021  Total Protein 6.1 - 8.1 g/dL 6.9  Albumin 3.8 - 4.8 g/dL -  Alpha-1 0.2 - 0.3 g/dL -  Alpha-2 0.5 - 0.9 g/dL -  Beta Globulin 0.4 - 0.6 g/dL -  Beta 2 0.2 - 0.5  g/dL -  Gamma Globulin 0.8 - 1.7 g/dL -  Interpretation - -   Chest Xray:10/31/2018 - mild CHF  Assessment/Plan:  Counseled patient that Donnetta Hail is a IL-17 inhibitor that works to reduce pain and inflammation associated with arthritis.  Counseled patient on purpose, proper use, and adverse effects of Taltz. Reviewed the most common adverse effects of infection (more commonly nasopharyngitis, URTI), inflammatory bowel disease, and allergic reaction. Counseled patient that Donnetta Hail should be held for infection and prior to scheduled surgery.  Counseled patient to avoid live vaccines while on Taltz. Recommend annual influenza, PCV 15 or PCV20 or Pneumovax 23, and Shingrix as indicated. Reviewed storage information for Taltz.  Reviewed the importance of regular labs while on [place]. Will monitor CBC and CMP 1 month after starting and every 3 months routinely thereafter. Will monitor TB gold annually. Standing orders placed. Provided  patient with medication education material and answered all questions.  Patient consented to [place].  Will upload consent into patient's chart.  Will apply for Taltz through patient's insurance and update when we receive a response.  Advised initial injection must be administered in office.  Patient voiced understanding.    Taltz dose will be: For psoriatic arthritis and plaque psoriasis overlap load of 160 mg then 80 mg on weeks 2,4,6,8,10,12 then 80 mg every 28 days  Prescription will be sent to pharmacy pending lab results and insurance approval. [employer] C[employer] application completed  [name], PharmD, MPH, BCPS Clinical Pharmacist (Rheumatology and Pulmonology)

## 2021-02-21 NOTE — Telephone Encounter (Signed)
Please start [place].  Dose: 160 mg then 80 mg on weeks 2,4,6,8,10,12 then 80 mg every 28 days    Dx: Psoriatic arthritis (L40.5) and Psoriasis (L40.9)  Avoiding JAK inhibitors (Rinvoq, Olumiant, Xeljanz) d/t significant CV history Avoiding TNF inhibitors (Humira, Enbrel, Simponi, Cimzia, Remicade) due history of CHF  Current regimen: [name]  Previously tried sulfasalazine - indequate response, leflunmode - inadequate clinical resposne  Patient took home [employer] PAP and will plan to complete and return to clinic via fax along with income documents. Provider portion already signed. Will place in "PAP pending info" folder while we wait patient portion as well as PA approval letter  [name], PharmD, MPH, BCPS Clinical Pharmacist (Rheumatology and Pulmonology)

## 2021-02-21 NOTE — Patient Instructions (Signed)
Standing Labs We placed an order today for your standing lab work.   Please have your standing labs drawn in 1 month then every 3 months   If possible, please have your labs drawn 2 weeks prior to your appointment so that the provider can discuss your results at your appointment.  Please note that you may see your imaging and lab results in [facility] before we have reviewed them. We may be awaiting multiple results to interpret others before contacting you. Please allow our office up to 72 hours to thoroughly review all of the results before contacting the office for clarification of your results.  We have open lab daily: Monday through Thursday from 1:30-4:30 PM and Friday from 1:30-4:00 PM at the office of Dr. Bo Merino, [facility].   Please be advised, all patients with office appointments requiring lab work will take precedent over walk-in lab work.  If possible, please come for your lab work on Monday and Friday afternoons, as you may experience shorter wait times. The office is located at [address], [place], [place], [place] [address] No appointment is necessary.   Labs are drawn by [facility]. Please bring your co-pay at the time of your lab draw.  You may receive a bill from [facility] for your lab work.  If you wish to have your labs drawn at another location, please call the office 24 hours in advance to send orders.  If you have any questions regarding directions or hours of operation,  please call [phone].   As a reminder, please drink plenty of water prior to coming for your lab work. Thanks!  Ixekizumab injection What is this medication? IXEKIZUMAB (ix e KIZ ue mab) is used to treat plaque psoriasis, psoriatic arthritis, ankylosing spondylitis, and active non-radiographic axial spondyloarthritis. This medicine may be used for other purposes; ask your health care provider or pharmacist if you have questions. COMMON BRAND NAME(S): TALTZ What should I  tell my care team before I take this medication? They need to know if you have any of these conditions: immune system problems infection (especially a viral infection such as chickenpox, cold sores, or herpes) recently received or are scheduled to receive a vaccine tuberculosis, a positive skin test for tuberculosis, or have recently been in close contact with someone who has tuberculosis an unusual or allergic reaction to ixekizumab, other medicines, foods, dyes or preservatives pregnant or trying to get pregnant breast-feeding How should I use this medication? This medicine is for injection under the skin. It may be administered by a healthcare professional in a hospital or clinic setting or at home. If you get this medicine at home, you will be taught how to prepare and give this medicine. Use exactly as directed. Take your medicine at regular intervals. Do not take your medicine more often than directed. It is important that you put your used needles and syringes in a special sharps container. Do not put them in a trash can. If you do not have a sharps container, call your pharmacist or healthcare provider to get one. A special MedGuide will be given to you by the pharmacist with each prescription and refill. Be sure to read this information carefully each time. Talk to your pediatrician regarding the use of this medicine in children. While this drug may be prescribed for children as young as 6 years for selected conditions, precautions do apply. Overdosage: If you think you have taken too much of this medicine contact a poison control center  or emergency room at once. NOTE: This medicine is only for you. Do not share this medicine with others. What if I miss a dose? It is important not to miss your dose. Call your doctor of health care professional if you are unable to keep an appointment. If you give yourself the medicine and you miss a dose, take it as soon as you can. Then be sure to take  your next doses on your regular schedule. Do not take double or extra doses. If you have questions about a missed injection, call your health care professional. What may interact with this medication? Do not take this medicine with any of the following medications: live virus vaccines This medicine may also interact with the following medications: inactivated vaccines This list may not describe all possible interactions. Give your health care provider a list of all the medicines, herbs, non-prescription drugs, or dietary supplements you use. Also tell them if you smoke, drink alcohol, or use illegal drugs. Some items may interact with your medicine. What should I watch for while using this medication? Tell your doctor or healthcare professional if your symptoms do not start to get better or if they get worse. You will be tested for tuberculosis (TB) before you start this medicine. If your doctor prescribes any medicine for TB, you should start taking the TB medicine before starting this medicine. Make sure to finish the full course of TB medicine. Call your doctor or healthcare professional for advice if you get a fever, chills or sore throat, or other symptoms of a cold or flu. Do not treat yourself. This drug decreases your body's ability to fight infections. Try to avoid being around people who are sick. This medicine can decrease the response to a vaccine. If you need to get vaccinated, tell your healthcare professional if you have received this medicine within the last 6 months. Extra booster doses may be needed. Talk to your doctor to see if a different vaccination schedule is needed. What side effects may I notice from receiving this medication? Side effects that you should report to your doctor or health care professional as soon as possible: allergic reactions like skin rash, itching or hives, swelling of the face, lips, or tongue signs and symptoms of infection like fever or chills; cough;  sore throat; pain or trouble passing urine signs and symptoms of bowel problems like abdominal pain, diarrhea, blood in the stool, and weight loss white patches in the mouth or throat vaginal discharge, itching, or odor in women Side effects that usually do not require medical attention (report to your doctor or health care professional if they continue or are bothersome): nausea runny nose sinus trouble This list may not describe all possible side effects. Call your doctor for medical advice about side effects. You may report side effects to FDA at 1-800-FDA-1088. Where should I keep my medication? Keep out of the reach of children. Store the prefilled syringe or injection pen in a refrigerator between 2 to 8 degrees C (36 to 46 degrees F). Keep the syringe or the pen in the original carton until ready for use. Protect from light. Do not freeze. Do not shake. Prior to use, remove the syringe or pen from the refrigerator and use within 30 minutes. Throw away any unused medicine after the expiration date on the label. NOTE: This sheet is a summary. It may not cover all possible information. If you have questions about this medicine, talk to your doctor, pharmacist, or health  care provider.  2022 Elsevier/Gold Standard (2020-12-06 00:00:00)

## 2021-02-22 NOTE — Telephone Encounter (Signed)
Patient will be switching to Taltz as of OV on 02/21/21. Will close this encounter.  [name], PharmD, MPH, BCPS Clinical Pharmacist (Rheumatology and Pulmonology)

## 2021-02-22 NOTE — Telephone Encounter (Signed)
Submitted a Prior Authorization request to [facility] for TALTZ via CoverMyMeds. Will update once we receive a response.  KeyRicky Ala PA Case ID: [id number]  [name], PharmD, MPH, BCPS Clinical Pharmacist (Rheumatology and Pulmonology)

## 2021-02-22 NOTE — Progress Notes (Signed)
Hepatitis B and C negative.

## 2021-02-24 ENCOUNTER — Other Ambulatory Visit (HOSPITAL_COMMUNITY): Payer: Self-pay

## 2021-02-24 LAB — PROTEIN ELECTROPHORESIS, SERUM, WITH REFLEX
Albumin ELP: 3.6 g/dL — ABNORMAL LOW (ref 3.8–4.8)
Alpha 1: 0.4 g/dL — ABNORMAL HIGH (ref 0.2–0.3)
Alpha 2: 1 g/dL — ABNORMAL HIGH (ref 0.5–0.9)
Beta 2: 0.5 g/dL (ref 0.2–0.5)
Beta Globulin: 0.5 g/dL (ref 0.4–0.6)
Gamma Globulin: 1.1 g/dL (ref 0.8–1.7)
Total Protein: 7.2 g/dL (ref 6.1–8.1)

## 2021-02-24 LAB — QUANTIFERON-TB GOLD PLUS
Mitogen-NIL: 0.43 IU/mL
NIL: 0.03 IU/mL
QuantiFERON-TB Gold Plus: UNDETERMINED — AB
TB1-NIL: 0 IU/mL
TB2-NIL: 0 IU/mL

## 2021-02-24 LAB — HEPATITIS B CORE ANTIBODY, IGM: Hep B C IgM: NONREACTIVE

## 2021-02-24 LAB — HEPATITIS C ANTIBODY
Hepatitis C Ab: NONREACTIVE
SIGNAL TO CUT-OFF: 0.05 (ref <1.00)

## 2021-02-24 LAB — HEPATITIS B SURFACE ANTIGEN: Hepatitis B Surface Ag: NONREACTIVE

## 2021-02-24 NOTE — Telephone Encounter (Signed)
Received notification from [facility] regarding a prior authorization for [place]. Authorization has been APPROVED from 02/22/2021 to 04/01/2022.   Per test claim, copay for 24mL as a 28 days supply is $3,283.18. Quantity required for loading dose is NOT approved under current authorization.  Patient can fill through [place]: [phone]   Authorization # [id number]  Will work on gathering up PAP paperwork.

## 2021-02-24 NOTE — Telephone Encounter (Signed)
Patient took home [employer] PAP for Taltz on 02/21/21 and will plan to complete and return to clinic via fax along with income documents. Provider portion already signed. Will place in "PAP pending info" folder while we wait patient portion as well as PA approval letter  [name], PharmD, MPH, BCPS Clinical Pharmacist (Rheumatology and Pulmonology)

## 2021-02-24 NOTE — Progress Notes (Signed)
SPEP did not reveal any abnormal proteins.

## 2021-02-26 NOTE — Progress Notes (Signed)
TB gold indeterminate.  Please advise the patient to return this week to recheck TB gold.

## 2021-02-27 ENCOUNTER — Other Ambulatory Visit: Payer: Self-pay | Admitting: *Deleted

## 2021-02-27 DIAGNOSIS — Z79899 Other long term (current) drug therapy: Secondary | ICD-10-CM

## 2021-02-27 DIAGNOSIS — L4050 Arthropathic psoriasis, unspecified: Secondary | ICD-10-CM

## 2021-02-28 NOTE — Telephone Encounter (Signed)
Received signed patient form with income documents.  Submitted Patient Assistance Application to [employer] for TALTZ along with provider portion, patient portion, med list, insurance card copy, PA and income documents. Will update patient when we receive a response.  Fax# [phone] Phone# [phone]  [name], PharmD, MPH, BCPS Clinical Pharmacist (Rheumatology and Pulmonology)

## 2021-03-06 ENCOUNTER — Other Ambulatory Visit: Payer: Self-pay | Admitting: *Deleted

## 2021-03-06 DIAGNOSIS — Z79899 Other long term (current) drug therapy: Secondary | ICD-10-CM

## 2021-03-06 DIAGNOSIS — L4050 Arthropathic psoriasis, unspecified: Secondary | ICD-10-CM | POA: Diagnosis not present

## 2021-03-07 ENCOUNTER — Other Ambulatory Visit: Payer: Self-pay

## 2021-03-07 ENCOUNTER — Ambulatory Visit (INDEPENDENT_AMBULATORY_CARE_PROVIDER_SITE_OTHER): Payer: Medicare Other

## 2021-03-07 DIAGNOSIS — E538 Deficiency of other specified B group vitamins: Secondary | ICD-10-CM | POA: Diagnosis not present

## 2021-03-07 MED ORDER — CYANOCOBALAMIN 1000 MCG/ML IJ SOLN
1000.0000 ug | Freq: Once | INTRAMUSCULAR | Status: AC
  Administered 2021-03-07: 1000 ug via INTRAMUSCULAR

## 2021-03-07 NOTE — Progress Notes (Addendum)
Per orders of Dr. [name], injection of every 2 weeks B12 1000 mcg/ml given by Pilar Grammes, CMA in Left Deltoid. Patient tolerated injection well.

## 2021-03-08 LAB — QUANTIFERON-TB GOLD PLUS
Mitogen-NIL: >10 IU/mL
NIL: 0.02 IU/mL
QuantiFERON-TB Gold Plus: NEGATIVE
TB1-NIL: 0 IU/mL
TB2-NIL: 0 IU/mL

## 2021-03-08 NOTE — Progress Notes (Signed)
TB Gold negative

## 2021-03-14 NOTE — Telephone Encounter (Signed)
Called [employer] for update on patient's Taltz PAP application  Per rep, the provider form is missing but all other pages have been received. Will refax provider portion to [facility]   Fax# [phone] Phone# [phone]  [name], PharmD, MPH, BCPS Clinical Pharmacist (Rheumatology and Pulmonology)

## 2021-03-16 ENCOUNTER — Other Ambulatory Visit: Payer: Self-pay | Admitting: Family Medicine

## 2021-03-16 NOTE — Telephone Encounter (Signed)
Refill request for PREGABALIN 50 MG CAPSULE  LOV - 12/19/20 Next OV - 05/25/21 Last refill - not sure as it has never been prescribed here

## 2021-03-17 ENCOUNTER — Other Ambulatory Visit: Payer: Self-pay | Admitting: Family Medicine

## 2021-03-17 DIAGNOSIS — I10 Essential (primary) hypertension: Secondary | ICD-10-CM

## 2021-03-17 NOTE — Telephone Encounter (Signed)
Called [employer] for Taltz PAP status . Per rep they still do not have prescriber form. Will refax today  Request application be expedited but rep states that they are a week behind on faxes and processing applications   Fax# [phone] Phone# [phone]  [name], PharmD, MPH, BCPS Clinical Pharmacist (Rheumatology and Pulmonology)

## 2021-03-20 NOTE — Telephone Encounter (Signed)
Received a fax from  [facility] regarding an approval for [place] patient assistance from 03/20/21 to 04/01/2022.   Phone number: [phone]  ATC patient to schedule Taltz new start. Unable to reach. If patient returns call, ok to schedule on any available time slot with pharmacy clinic  [name], PharmD, MPH, BCPS Clinical Pharmacist (Rheumatology and Pulmonology)

## 2021-03-21 ENCOUNTER — Ambulatory Visit (INDEPENDENT_AMBULATORY_CARE_PROVIDER_SITE_OTHER): Payer: Medicare Other

## 2021-03-21 ENCOUNTER — Other Ambulatory Visit: Payer: Self-pay

## 2021-03-21 DIAGNOSIS — E538 Deficiency of other specified B group vitamins: Secondary | ICD-10-CM

## 2021-03-21 MED ORDER — CYANOCOBALAMIN 1000 MCG/ML IJ SOLN
1000.0000 ug | Freq: Once | INTRAMUSCULAR | Status: AC
  Administered 2021-03-21: 12:00:00 1000 ug via INTRAMUSCULAR

## 2021-03-21 NOTE — Telephone Encounter (Signed)
ATC patient to schedule Taltz new start - left VM requesting return call. Also sent MyChart message  [name], PharmD, MPH, BCPS Clinical Pharmacist (Rheumatology and Pulmonology)

## 2021-03-21 NOTE — Telephone Encounter (Signed)
Patient returned call. He states he will call [employer] today to schedule first shipment of medication to his home. He states he would like to wait until after holiday to start [place]. He will call [employer] and then return call to clinic to schedule Taltz new start visit  [name], PharmD, MPH, BCPS Clinical Pharmacist (Rheumatology and Pulmonology)

## 2021-03-21 NOTE — Progress Notes (Signed)
Patient presented for B 12 injection to right deltoid, patient voiced no concerns nor showed any signs of distress during injection. 

## 2021-03-29 ENCOUNTER — Telehealth: Payer: Self-pay | Admitting: Rheumatology

## 2021-03-29 NOTE — Telephone Encounter (Signed)
Patient called the office stating he has received his Taltz prescription today and the dosage is not correct. Patient states he received the maintenance dose not the starting dose.

## 2021-03-29 NOTE — Telephone Encounter (Signed)
Spoke with patient and advised he will be receiving the loading dose of Taltz in the office at his new start visit. Patient advised we will be using samples for the loading dose. Patient expressed understanding and has been scheduled for 04/04/2021 at 9:30 am.

## 2021-04-01 ENCOUNTER — Other Ambulatory Visit: Payer: Self-pay | Admitting: Family Medicine

## 2021-04-04 ENCOUNTER — Ambulatory Visit (INDEPENDENT_AMBULATORY_CARE_PROVIDER_SITE_OTHER): Payer: Medicare Other | Admitting: Pharmacist

## 2021-04-04 ENCOUNTER — Other Ambulatory Visit: Payer: Self-pay

## 2021-04-04 VITALS — BP 187/104 | HR 42

## 2021-04-04 DIAGNOSIS — L409 Psoriasis, unspecified: Secondary | ICD-10-CM | POA: Diagnosis not present

## 2021-04-04 DIAGNOSIS — Z79899 Other long term (current) drug therapy: Secondary | ICD-10-CM | POA: Diagnosis not present

## 2021-04-04 DIAGNOSIS — Z7189 Other specified counseling: Secondary | ICD-10-CM

## 2021-04-04 DIAGNOSIS — L4050 Arthropathic psoriasis, unspecified: Secondary | ICD-10-CM

## 2021-04-04 MED ORDER — TALTZ 80 MG/ML ~~LOC~~ SOAJ: SUBCUTANEOUS | 0 refills | Status: DC

## 2021-04-04 NOTE — Patient Instructions (Signed)
Your next TALTZ dose is due on 04/18/21, 05/02/21, 05/16/21, 05/30/21, 06/13/21, 06/27/21, THEN EVERY 28 DAYS (starting 07/25/21)  [place] if you have signs or symptoms of an infection. You can resume once you feel better or back to your baseline. HOLD TALTZ if you start antibiotics to treat an infection. HOLD TALTZ around the time of surgery/procedures. Your surgeon will be able to provide recommendations on when to hold BEFORE and when you are cleared to [place].  Pharmacy information: Your prescription will be shipped from [facility]. Their phone number is [phone]  Labs are due in 1 month then every 3 months. Lab hours are from Monday to Thursday 1:30-4:30pm and Friday 1:30-4pm. You do not need an appointment if you come for labs during these times.  How to manage an injection site reaction: Remember the 5 C's: COUNTER - leave on the counter at least 30 minutes but up to overnight to bring medication to room temperature. This may help prevent stinging COLD - place something cold (like an ice gel pack or cold water bottle) on the injection site just before cleansing with alcohol. This may help reduce pain CLARITIN - use Claritin (generic name is loratadine) for the first two weeks of treatment or the day of, the day before, and the day after injecting. This will help to minimize injection site reactions CORTISONE CREAM - apply if injection site is irritated and itching CALL ME - if injection site reaction is bigger than the size of your fist, looks infected, blisters, or if you develop hives

## 2021-04-04 NOTE — Telephone Encounter (Signed)
Patient had [name] new start visit on 04/04/21

## 2021-04-04 NOTE — Progress Notes (Signed)
Pharmacy Note  Subjective:   Patient presents to clinic today to receive first dose of Taltz. He was previously taking [place] - last dose was shortly before Christmas.  Patient running a fever or have signs/symptoms of infection? No  Patient currently on antibiotics for the treatment of infection? No  Patient have any upcoming invasive procedures/surgeries? No  Objective: CMP     Component Value Date/Time   NA 143 02/16/2021 1332   NA 140 11/11/2019 1418   NA 139 09/03/2014 1039   K 3.3 (L) 02/16/2021 1332   K 4.3 09/03/2014 1039   CL 107 02/16/2021 1332   CO2 25 02/16/2021 1332   CO2 23 09/03/2014 1039   GLUCOSE 158 (H) 02/16/2021 1332   GLUCOSE 156 (H) 09/03/2014 1039   BUN 14 02/16/2021 1332   BUN 22 11/11/2019 1418   BUN 23.0 09/03/2014 1039   CREATININE 1.15 02/16/2021 1332   CREATININE 1.0 09/03/2014 1039   CALCIUM 9.0 02/16/2021 1332   CALCIUM 9.3 09/03/2014 1039   PROT 7.2 02/21/2021 1203   PROT 7.2 11/11/2019 1418   PROT 7.5 09/03/2014 1039   ALBUMIN 3.9 06/02/2020 0943   ALBUMIN 4.0 11/11/2019 1418   ALBUMIN 3.5 09/03/2014 1039   AST 11 02/16/2021 1332   AST 15 09/03/2014 1039   ALT 11 02/16/2021 1332   ALT 14 09/03/2014 1039   ALKPHOS 99 06/02/2020 0943   ALKPHOS 125 09/03/2014 1039   BILITOT 0.7 02/16/2021 1332   BILITOT 0.6 11/11/2019 1418   BILITOT 1.10 09/03/2014 1039   GFRNONAA 66 02/11/2020 1441   GFRAA 77 02/11/2020 1441    CBC    Component Value Date/Time   WBC 8.5 02/16/2021 1332   RBC 3.42 (L) 02/16/2021 1332   HGB 10.3 (L) 02/16/2021 1332   HGB 11.9 (L) 11/11/2019 1418   HGB 12.3 (L) 09/03/2014 1037   HCT 30.8 (L) 02/16/2021 1332   HCT 34.6 (L) 11/11/2019 1418   HCT 36.1 (L) 09/03/2014 1037   PLT 257 02/16/2021 1332   PLT 247 11/11/2019 1418   MCV 90.1 02/16/2021 1332   MCV 93 11/11/2019 1418   MCV 88.6 09/03/2014 1037   MCH 30.1 02/16/2021 1332   MCHC 33.4 02/16/2021 1332   RDW 12.9 02/16/2021 1332   RDW 12.9 11/11/2019 1418    RDW 13.9 09/03/2014 1037   LYMPHSABS 1,148 02/16/2021 1332   LYMPHSABS 1.5 11/11/2019 1418   LYMPHSABS 1.2 09/03/2014 1037   MONOABS 0.6 06/02/2020 0943   MONOABS 0.4 09/03/2014 1037   EOSABS 238 02/16/2021 1332   EOSABS 0.4 11/11/2019 1418   BASOSABS 34 02/16/2021 1332   BASOSABS 0.1 11/11/2019 1418   BASOSABS 0.1 09/03/2014 1037    Baseline Immunosuppressant Therapy Labs TB GOLD Quantiferon TB Gold Latest Ref Rng & Units 03/06/2021  Quantiferon TB Gold Plus NEGATIVE NEGATIVE   Hepatitis Panel Hepatitis Latest Ref Rng & Units 02/21/2021  Hep B Surface Ag NON-REACTIVE NON-REACTIVE  Hep B IgM NON-REACTIVE NON-REACTIVE  Hep C Ab NEGATIVE -  Hep C Ab NON-REACTIVE NON-REACTIVE  Hep C Ab NON-REACTIVE NON-REACTIVE   HIV Lab Results  Component Value Date   HIV NON-REACTIVE 08/10/2019   HIV Non Reactive 10/18/2014   Immunoglobulins Immunoglobulin Electrophoresis Latest Ref Rng & Units 08/10/2019  IgA  70 - 320 mg/dL 481(H)  IgG 600 - 1,540 mg/dL 1,213  IgM 50 - 300 mg/dL 46(L)   SPEP Serum Protein Electrophoresis Latest Ref Rng & Units 02/21/2021  Total Protein 6.1 - 8.1  g/dL 7.2  Albumin 3.8 - 4.8 g/dL 3.6(L)  Alpha-1 0.2 - 0.3 g/dL 0.4(H)  Alpha-2 0.5 - 0.9 g/dL 1.0(H)  Beta Globulin 0.4 - 0.6 g/dL 0.5  Beta 2 0.2 - 0.5 g/dL 0.5  Gamma Globulin 0.8 - 1.7 g/dL 1.1  Interpretation - -   Chest x-ray: 10/31/18 - mild CHF  Assessment/Plan:  Demonstrated proper injection technique with Taltz demo device  Patient able to demonstrate proper injection technique using the teach back method.  Patient self injected in the right thigh and left thigh with:  Sample Medication: Taltz 80mg /mL autoinjector x 3 (TOTAL DOSE of 160mg ) Lot: [id number] Expiration: 09/25/2022  Patient tolerated well.  Observed for 30 mins in office for adverse reaction and none noted.   Patient is to return in 1 month for labs and 6-8 weeks for follow-up appointment.  Standing orders for CBC and CMP  remain in place.   Taltz approved through patient assistance .   Rx sent to: [facility] for [place]/Taltz: [id number]. Patient has received Week 2 and Week 4 dose at home already and is set up for auto refills with pharmacy.  Patient stopped Otezla shortly before 03/26/21.  He will continue Taltz for psoriatic arthritis and plaque psoriasis overlap load of 160 mg (received in clinic today) at Week 0, then 80 mg SQ on weeks 2,4,6,8,10,12 then 80 mg every 28 days  . Provided with printed calendar with due dates for remainder of 2023 calendar year. We reviewed in detail the necessity to hold [place] for signs and symptoms of infection, antibiotics use, and surgery/procedures.  All questions encouraged and answered.  Instructed patient to call with any further questions or concerns.  [name], PharmD, MPH, BCPS Clinical Pharmacist (Rheumatology and Pulmonology)  04/04/2021 9:40 AM

## 2021-04-05 ENCOUNTER — Ambulatory Visit (INDEPENDENT_AMBULATORY_CARE_PROVIDER_SITE_OTHER): Payer: Medicare Other

## 2021-04-05 DIAGNOSIS — E538 Deficiency of other specified B group vitamins: Secondary | ICD-10-CM | POA: Diagnosis not present

## 2021-04-05 MED ORDER — CYANOCOBALAMIN 1000 MCG/ML IJ SOLN
1000.0000 ug | Freq: Once | INTRAMUSCULAR | Status: AC
  Administered 2021-04-05: 1000 ug via INTRAMUSCULAR

## 2021-04-05 NOTE — Progress Notes (Signed)
Per orders of Dr. Danise Mina in leu of Dr. Josefine Class absence, an injection of B12 was given by Ophelia Shoulder, CMA. Patient tolerated injection well.

## 2021-04-19 ENCOUNTER — Ambulatory Visit (INDEPENDENT_AMBULATORY_CARE_PROVIDER_SITE_OTHER): Payer: Medicare Other

## 2021-04-19 ENCOUNTER — Other Ambulatory Visit: Payer: Self-pay

## 2021-04-19 DIAGNOSIS — E538 Deficiency of other specified B group vitamins: Secondary | ICD-10-CM | POA: Diagnosis not present

## 2021-04-19 MED ORDER — CYANOCOBALAMIN 1000 MCG/ML IJ SOLN
1000.0000 ug | Freq: Once | INTRAMUSCULAR | Status: AC
  Administered 2021-04-19: 1000 ug via INTRAMUSCULAR

## 2021-04-19 NOTE — Progress Notes (Signed)
Per orders of Dr. Glori Bickers in leu of Dr. Josefine Class absence, an injection of B12 was given by Ophelia Shoulder, CMA. Injection was placed in the right deltoid.  Patient tolerated injection well.

## 2021-04-25 ENCOUNTER — Ambulatory Visit: Payer: Medicare Other | Admitting: Rheumatology
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
## 2021-05-03 ENCOUNTER — Ambulatory Visit (INDEPENDENT_AMBULATORY_CARE_PROVIDER_SITE_OTHER): Payer: Medicare Other

## 2021-05-03 ENCOUNTER — Other Ambulatory Visit: Payer: Self-pay

## 2021-05-03 DIAGNOSIS — E538 Deficiency of other specified B group vitamins: Secondary | ICD-10-CM

## 2021-05-03 MED ORDER — CYANOCOBALAMIN 1000 MCG/ML IJ SOLN
1000.0000 ug | Freq: Once | INTRAMUSCULAR | Status: AC
  Administered 2021-05-03: 1000 ug via INTRAMUSCULAR

## 2021-05-03 NOTE — Progress Notes (Signed)
Per orders of Dr. Lorelei Pont in leu of Dr. Josefine Class absence, an injection of B12 was  given by Ophelia Shoulder, CMA in the left deltoid. Patient tolerated injection well.

## 2021-05-12 ENCOUNTER — Telehealth: Payer: Self-pay | Admitting: *Deleted

## 2021-05-12 NOTE — Telephone Encounter (Signed)
Patient requesting refill on Taltz to [facility] ,[place] # [phone] per patient. [phone]

## 2021-05-12 NOTE — Telephone Encounter (Signed)
Per [facility] pharmacy, they have refills on Taltz rx. Initiated fill on phone today.  Will be shipped latest Tuesday from pharmacy to arrive to patient's home on Wednesday  [name], PharmD, MPH, BCPS Clinical Pharmacist (Rheumatology and Pulmonology)

## 2021-05-15 ENCOUNTER — Other Ambulatory Visit: Payer: Self-pay

## 2021-05-15 ENCOUNTER — Other Ambulatory Visit (INDEPENDENT_AMBULATORY_CARE_PROVIDER_SITE_OTHER): Payer: Medicare Other

## 2021-05-15 DIAGNOSIS — I10 Essential (primary) hypertension: Secondary | ICD-10-CM

## 2021-05-15 DIAGNOSIS — Z125 Encounter for screening for malignant neoplasm of prostate: Secondary | ICD-10-CM | POA: Diagnosis not present

## 2021-05-15 DIAGNOSIS — D638 Anemia in other chronic diseases classified elsewhere: Secondary | ICD-10-CM | POA: Diagnosis not present

## 2021-05-15 DIAGNOSIS — E538 Deficiency of other specified B group vitamins: Secondary | ICD-10-CM

## 2021-05-15 DIAGNOSIS — E559 Vitamin D deficiency, unspecified: Secondary | ICD-10-CM

## 2021-05-15 DIAGNOSIS — E119 Type 2 diabetes mellitus without complications: Secondary | ICD-10-CM | POA: Diagnosis not present

## 2021-05-15 LAB — VITAMIN D 25 HYDROXY (VIT D DEFICIENCY, FRACTURES): VITD: 68.17 ng/mL (ref 30.00–100.00)

## 2021-05-15 LAB — CBC WITH DIFFERENTIAL/PLATELET
Basophils Absolute: 0.1 10*3/uL (ref 0.0–0.1)
Basophils Relative: 0.9 % (ref 0.0–3.0)
Eosinophils Absolute: 0.3 10*3/uL (ref 0.0–0.7)
Eosinophils Relative: 4.1 % (ref 0.0–5.0)
HCT: 33.2 % — ABNORMAL LOW (ref 39.0–52.0)
Hemoglobin: 11.2 g/dL — ABNORMAL LOW (ref 13.0–17.0)
Lymphocytes Relative: 16.9 % (ref 12.0–46.0)
Lymphs Abs: 1.3 10*3/uL (ref 0.7–4.0)
MCHC: 33.8 g/dL (ref 30.0–36.0)
MCV: 89.3 fl (ref 78.0–100.0)
Monocytes Absolute: 0.4 10*3/uL (ref 0.1–1.0)
Monocytes Relative: 5.3 % (ref 3.0–12.0)
Neutro Abs: 5.5 10*3/uL (ref 1.4–7.7)
Neutrophils Relative %: 72.8 % (ref 43.0–77.0)
Platelets: 210 10*3/uL (ref 150.0–400.0)
RBC: 3.72 Mil/uL — ABNORMAL LOW (ref 4.22–5.81)
RDW: 15.3 % (ref 11.5–15.5)
WBC: 7.6 10*3/uL (ref 4.0–10.5)

## 2021-05-15 LAB — HEMOGLOBIN A1C: Hgb A1c MFr Bld: 6.6 % — ABNORMAL HIGH (ref 4.6–6.5)

## 2021-05-15 LAB — TSH: TSH: 4.1 u[IU]/mL (ref 0.35–5.50)

## 2021-05-15 LAB — VITAMIN B12: Vitamin B-12: 729 pg/mL (ref 211–911)

## 2021-05-15 LAB — PSA, MEDICARE: PSA: 1.1 ng/ml (ref 0.10–4.00)

## 2021-05-16 ENCOUNTER — Ambulatory Visit (INDEPENDENT_AMBULATORY_CARE_PROVIDER_SITE_OTHER): Payer: Medicare Other | Admitting: Physician Assistant

## 2021-05-16 ENCOUNTER — Encounter: Payer: Self-pay | Admitting: Physician Assistant

## 2021-05-16 VITALS — BP 189/75 | HR 78 | Resp 12 | Ht 69.5 in | Wt 270.6 lb

## 2021-05-16 DIAGNOSIS — Z96653 Presence of artificial knee joint, bilateral: Secondary | ICD-10-CM

## 2021-05-16 DIAGNOSIS — L409 Psoriasis, unspecified: Secondary | ICD-10-CM | POA: Diagnosis not present

## 2021-05-16 DIAGNOSIS — I472 Ventricular tachycardia, unspecified: Secondary | ICD-10-CM

## 2021-05-16 DIAGNOSIS — Z8679 Personal history of other diseases of the circulatory system: Secondary | ICD-10-CM

## 2021-05-16 DIAGNOSIS — G2581 Restless legs syndrome: Secondary | ICD-10-CM

## 2021-05-16 DIAGNOSIS — M19071 Primary osteoarthritis, right ankle and foot: Secondary | ICD-10-CM

## 2021-05-16 DIAGNOSIS — M722 Plantar fascial fibromatosis: Secondary | ICD-10-CM | POA: Diagnosis not present

## 2021-05-16 DIAGNOSIS — L4050 Arthropathic psoriasis, unspecified: Secondary | ICD-10-CM | POA: Diagnosis not present

## 2021-05-16 DIAGNOSIS — M19041 Primary osteoarthritis, right hand: Secondary | ICD-10-CM

## 2021-05-16 DIAGNOSIS — Z8639 Personal history of other endocrine, nutritional and metabolic disease: Secondary | ICD-10-CM | POA: Diagnosis not present

## 2021-05-16 DIAGNOSIS — Z79899 Other long term (current) drug therapy: Secondary | ICD-10-CM | POA: Diagnosis not present

## 2021-05-16 DIAGNOSIS — Z7901 Long term (current) use of anticoagulants: Secondary | ICD-10-CM | POA: Diagnosis not present

## 2021-05-16 DIAGNOSIS — Z8669 Personal history of other diseases of the nervous system and sense organs: Secondary | ICD-10-CM

## 2021-05-16 DIAGNOSIS — M7662 Achilles tendinitis, left leg: Secondary | ICD-10-CM

## 2021-05-16 DIAGNOSIS — Z87448 Personal history of other diseases of urinary system: Secondary | ICD-10-CM

## 2021-05-16 DIAGNOSIS — M19072 Primary osteoarthritis, left ankle and foot: Secondary | ICD-10-CM

## 2021-05-16 DIAGNOSIS — M19042 Primary osteoarthritis, left hand: Secondary | ICD-10-CM

## 2021-05-16 LAB — LIPID PANEL
Cholesterol: 120 mg/dL (ref 0–200)
HDL: 35.4 mg/dL — ABNORMAL LOW (ref >39.00)
LDL Cholesterol: 61 mg/dL (ref 0–99)
NonHDL: 84.96
Total CHOL/HDL Ratio: 3
Triglycerides: 120 mg/dL (ref 0.0–149.0)
VLDL: 24 mg/dL (ref 0.0–40.0)

## 2021-05-16 LAB — COMPREHENSIVE METABOLIC PANEL
ALT: 9 U/L (ref 0–53)
AST: 12 U/L (ref 0–37)
Albumin: 4.1 g/dL (ref 3.5–5.2)
Alkaline Phosphatase: 131 U/L — ABNORMAL HIGH (ref 39–117)
BUN: 15 mg/dL (ref 6–23)
CO2: 30 mEq/L (ref 19–32)
Calcium: 9.4 mg/dL (ref 8.4–10.5)
Chloride: 106 mEq/L (ref 96–112)
Creatinine, Ser: 1.11 mg/dL (ref 0.40–1.50)
GFR: 65.28 mL/min (ref >60.00)
Glucose, Bld: 158 mg/dL — ABNORMAL HIGH (ref 70–99)
Potassium: 4.1 mEq/L (ref 3.5–5.1)
Sodium: 142 mEq/L (ref 135–145)
Total Bilirubin: 0.7 mg/dL (ref 0.2–1.2)
Total Protein: 7.2 g/dL (ref 6.0–8.3)

## 2021-05-16 LAB — IRON: Iron: 54 ug/dL (ref 42–165)

## 2021-05-16 NOTE — Patient Instructions (Signed)
Standing Labs °We placed an order today for your standing lab work.  ° °Please have your standing labs drawn in May and every 3 months  ° ° °If possible, please have your labs drawn 2 weeks prior to your appointment so that the provider can discuss your results at your appointment. ° °Please note that you may see your imaging and lab results in MyChart before we have reviewed them. °We may be awaiting multiple results to interpret others before contacting you. °Please allow our office up to 72 hours to thoroughly review all of the results before contacting the office for clarification of your results. ° °We have open lab daily: °Monday through Thursday from 1:30-4:30 PM and Friday from 1:30-4:00 PM °at the office of Dr. Shaili Deveshwar, [facility].   °Please be advised, all patients with office appointments requiring lab work will take precedent over walk-in lab work.  °If possible, please come for your lab work on Monday and Friday afternoons, as you may experience shorter wait times. °The office is located at [address], [address], [place], [place] [address] °No appointment is necessary.   °Labs are drawn by [facility]. Please bring your co-pay at the time of your lab draw.  You may receive a bill from [facility] for your lab work. ° °Please note if you are on Hydroxychloroquine and and an order has been placed for a Hydroxychloroquine level, you will need to have it drawn 4 hours or more after your last dose. ° °If you wish to have your labs drawn at another location, please call the office 24 hours in advance to send orders. ° °If you have any questions regarding directions or hours of operation,  °please call [phone].   °As a reminder, please drink plenty of water prior to coming for your lab work. Thanks! ° °

## 2021-05-17 ENCOUNTER — Ambulatory Visit: Payer: Medicare Other

## 2021-05-25 ENCOUNTER — Ambulatory Visit: Payer: Medicare Other | Admitting: Family Medicine

## 2021-05-25 DIAGNOSIS — I5030 Unspecified diastolic (congestive) heart failure: Secondary | ICD-10-CM | POA: Diagnosis not present

## 2021-05-29 ENCOUNTER — Ambulatory Visit: Payer: Medicare Other | Admitting: Family Medicine

## 2021-05-29 ENCOUNTER — Other Ambulatory Visit: Payer: Self-pay | Admitting: Family Medicine

## 2021-06-13 DIAGNOSIS — I5030 Unspecified diastolic (congestive) heart failure: Secondary | ICD-10-CM | POA: Diagnosis not present

## 2021-06-30 ENCOUNTER — Encounter: Payer: Self-pay | Admitting: Family Medicine

## 2021-06-30 ENCOUNTER — Ambulatory Visit (INDEPENDENT_AMBULATORY_CARE_PROVIDER_SITE_OTHER): Payer: Medicare Other | Admitting: Family Medicine

## 2021-06-30 VITALS — BP 108/58 | HR 68 | Temp 97.3°F | Ht 69.5 in | Wt 256.0 lb

## 2021-06-30 DIAGNOSIS — R399 Unspecified symptoms and signs involving the genitourinary system: Secondary | ICD-10-CM | POA: Diagnosis not present

## 2021-06-30 DIAGNOSIS — E538 Deficiency of other specified B group vitamins: Secondary | ICD-10-CM | POA: Diagnosis not present

## 2021-06-30 DIAGNOSIS — E119 Type 2 diabetes mellitus without complications: Secondary | ICD-10-CM

## 2021-06-30 DIAGNOSIS — I10 Essential (primary) hypertension: Secondary | ICD-10-CM

## 2021-06-30 DIAGNOSIS — E1149 Type 2 diabetes mellitus with other diabetic neurological complication: Secondary | ICD-10-CM | POA: Diagnosis not present

## 2021-06-30 DIAGNOSIS — Z0000 Encounter for general adult medical examination without abnormal findings: Secondary | ICD-10-CM | POA: Diagnosis not present

## 2021-06-30 DIAGNOSIS — Z1211 Encounter for screening for malignant neoplasm of colon: Secondary | ICD-10-CM

## 2021-06-30 DIAGNOSIS — Z7189 Other specified counseling: Secondary | ICD-10-CM

## 2021-06-30 DIAGNOSIS — R4586 Emotional lability: Secondary | ICD-10-CM

## 2021-06-30 DIAGNOSIS — G4733 Obstructive sleep apnea (adult) (pediatric): Secondary | ICD-10-CM

## 2021-06-30 DIAGNOSIS — L4050 Arthropathic psoriasis, unspecified: Secondary | ICD-10-CM

## 2021-06-30 MED ORDER — METFORMIN HCL ER 750 MG PO TB24: 750.0000 mg | ORAL_TABLET | Freq: Two times a day (BID) | ORAL | 3 refills | Status: DC

## 2021-06-30 MED ORDER — TAMSULOSIN HCL 0.4 MG PO CAPS: 0.4000 mg | ORAL_CAPSULE | Freq: Every day | ORAL | 3 refills | Status: DC

## 2021-06-30 MED ORDER — CYANOCOBALAMIN 1000 MCG/ML IJ SOLN: 1000.0000 ug | INTRAMUSCULAR | Status: DC

## 2021-06-30 MED ORDER — LISINOPRIL 20 MG PO TABS: 20.0000 mg | ORAL_TABLET | Freq: Every day | ORAL | Status: DC

## 2021-06-30 MED ORDER — PREGABALIN 50 MG PO CAPS: ORAL_CAPSULE | ORAL | 1 refills | Status: DC

## 2021-06-30 MED ORDER — CYANOCOBALAMIN 1000 MCG/ML IJ SOLN
1000.0000 ug | Freq: Once | INTRAMUSCULAR | Status: AC
  Administered 2021-06-30: 1000 ug via INTRAMUSCULAR

## 2021-06-30 MED ORDER — TORSEMIDE 20 MG PO TABS: 30.0000 mg | ORAL_TABLET | Freq: Every day | ORAL | Status: DC

## 2021-06-30 NOTE — Progress Notes (Signed)
I have personally reviewed the Medicare Annual Wellness questionnaire and have noted ?1. The patient's medical and social history ?2. Their use of alcohol, tobacco or illicit drugs ?3. Their current medications and supplements ?4. The patient's functional ability including ADL's, fall risks, home safety risks and hearing or visual ?            impairment. ?5. Diet and physical activities ?6. Evidence for depression or mood disorders ? ?The patients weight, height, BMI have been recorded in the chart and visual acuity is per eye clinic.  ?I have made referrals, counseling and provided education to the patient based review of the above and I have provided the pt with a written personalized care plan for preventive services. ? ?Provider list updated- see scanned forms.  Routine anticipatory guidance given to patient.  See health maintenance. The possibility exists that previously documented standard health maintenance information may have been brought forward from a previous encounter into this note.  If needed, that same information has been updated to reflect the current situation based on today's encounter.   ? ?Flu 2022 ?Shingles discussed with patient ?PNA up-to-date ?Tetanus discussed with patient ?COVID-vaccine previously done ?Cologuard ordered 2023 ?Prostate cancer screening 2023 ?Advance directive-wife designated if patient were incapacitated. ?Cognitive function addressed- see scanned forms- and if abnormal then additional documentation follows.  ? ?In addition to [facility] Wellness, follow up visit for the below conditions: ? ?Hypertension:    ?Using medication without problems or lightheadedness: yes ?Chest pain with exertion:no ?Edema:no ?Short of breath:no, improved with inc torsemide.   ? ?Mood per psych.  His daughter died, condolences offered.  Considering the situation/circumstances, he is doing as well as he could.   ? ?OSA on CPAP at baseline.  Per pulmonary.   ? ?Joint pain is better on Taltz.  He  has been walking more in the meantime. ? ?Diabetes:  ?Using medications without difficulties: yes ?Hypoglycemic episodes:no ?Hyperglycemic episodes:no ?Feet problems:better with lyrica.  D/w pt.   ?Blood Sugars averaging: usually ~150 ?eye exam within last year: yes ?Prev labs d/w pt. intentional weight loss noted. ? ?B12. He had missed B12 dose recently.  D/w pt about changing back to monthly treatment.   ? ?Intentional weight loss noted over the years.  I thanked him for his effort. ? ?Hearing discussed with patient.  He wanted to defer hearing aids for now.  He will update me as needed. ? ?Speech. He is used to having rapid access to words and occ have a slightly delay now.  Speech isn't garbled or changed o/w.  Agreed to observe for now.   ? ?Urinary sx.  Worse LUTS on torsemide.  PSA wnl.  Has urinary urgency.  Worse off flomax, d/w pt about restart.  Routine cautions d/w pt.   ? ?PMH and SH reviewed ? ?Meds, vitals, and allergies reviewed.  ? ?ROS: Per HPI.  Unless specifically indicated otherwise in HPI, the patient denies: ? ?General: fever. ?Eyes: acute vision changes ?ENT: sore throat ?Cardiovascular: chest pain ?Respiratory: SOB ?GI: vomiting ?GU: dysuria ?Musculoskeletal: acute back pain ?Derm: acute rash ?Neuro: acute motor dysfunction ?Psych: worsening mood ?Endocrine: polydipsia ?Heme: bleeding ?Allergy: hayfever ? ?GEN: nad, alert and oriented ?HEENT: ncat ?NECK: supple w/o LA ?CV: rrr. ?PULM: ctab, no inc wob ?ABD: soft, +bs ?EXT: no edema ?SKIN: Well-perfused ? ?Diabetic foot exam: ?Normal inspection ?No skin breakdown ?No calluses  ?Normal DP pulses ?Normal sensation to light touch and monofilament ?Nails thickened.   ?

## 2021-06-30 NOTE — Patient Instructions (Addendum)
If lightheaded while taking flomax, then cut the lisinopril back to '10mg'$  a day.   ?Take care.  Glad to see you. ?Plan on recheck in about 6 months with B12 level and A1c before the visit.   ?Change B12 to monthly injection.   ?

## 2021-07-03 ENCOUNTER — Encounter: Payer: Self-pay | Admitting: Family Medicine

## 2021-07-03 DIAGNOSIS — R399 Unspecified symptoms and signs involving the genitourinary system: Secondary | ICD-10-CM | POA: Insufficient documentation

## 2021-07-03 NOTE — Assessment & Plan Note (Signed)
Advance directive- wife designated if patient were incapacitated.  

## 2021-07-03 NOTE — Assessment & Plan Note (Signed)
Continue CPAP.  

## 2021-07-03 NOTE — Assessment & Plan Note (Signed)
Reasonable to change to monthly replacement with B12. ?Plan on recheck in about 6 months with B12 level and A1c before the visit.   ?

## 2021-07-03 NOTE — Assessment & Plan Note (Signed)
?  Flu 2022 ?Shingles discussed with patient ?PNA up-to-date ?Tetanus discussed with patient ?COVID-vaccine previously done ?Cologuard ordered 2023 ?Prostate cancer screening 2023 ?Advance directive-wife designated if patient were incapacitated. ?Cognitive function addressed- see scanned forms- and if abnormal then additional documentation follows.  ?

## 2021-07-03 NOTE — Assessment & Plan Note (Signed)
? ?  Psychiatry.  Condolences offered regarding his daughter.  He will update me as needed. ?

## 2021-07-03 NOTE — Assessment & Plan Note (Signed)
Can continue torsemide lisinopril.  Labs discussed with patient. ?

## 2021-07-03 NOTE — Assessment & Plan Note (Signed)
Per outside clinic, joint pain clearly improved on Toltz. ?

## 2021-07-03 NOTE — Assessment & Plan Note (Signed)
Restart Flomax with routine cautions discussed with patient.  He can cut his lisinopril in half if he is lightheaded when he is taking Flomax.  ?

## 2021-07-03 NOTE — Assessment & Plan Note (Signed)
A1c slightly higher but not high enough to change medication.  Continue metformin. Plan on recheck in about 6 months with B12 level and A1c before the visit.  Continue Lyrica for foot pain.  Prescription sent. ?

## 2021-07-04 ENCOUNTER — Other Ambulatory Visit: Payer: Self-pay | Admitting: Physician Assistant

## 2021-07-04 DIAGNOSIS — Z79899 Other long term (current) drug therapy: Secondary | ICD-10-CM

## 2021-07-04 DIAGNOSIS — L409 Psoriasis, unspecified: Secondary | ICD-10-CM

## 2021-07-04 DIAGNOSIS — L4050 Arthropathic psoriasis, unspecified: Secondary | ICD-10-CM

## 2021-07-04 NOTE — Telephone Encounter (Signed)
Next Visit: 08/15/2021 ? ?Last Visit: 05/16/2021 ? ?Last Fill: 04/04/2021 ? ?FP:[id number] arthritis  ? ?Current Dose per office note 05/16/2021: Taltz-initiated the loading dose on 04/04/2021. Taltz dose will be: For psoriatic arthritis and plaque psoriasis overlap load of 160 mg then 80 mg on weeks 2,4,6,8,10,12 then 80 mg every 28 days   ? ?Labs: 05/15/2021 RBC 37.2, Hgb 11.2, Hct 33.2, Glucose 158, Alk. Phos. 131 ? ?TB Gold: 03/06/2021 Neg   ? ?Okay to refill Donnetta Hail?  ?

## 2021-07-11 DIAGNOSIS — I5030 Unspecified diastolic (congestive) heart failure: Secondary | ICD-10-CM | POA: Diagnosis not present

## 2021-08-01 NOTE — Progress Notes (Signed)
? ?Office Visit Note ? ?Patient: [name]             ?Date of Birth: 1946/03/20           ?MRN: [id number]             ?PCP: [name], MD ?Referring: [name], MD ?Visit Date: 08/15/2021 ?Occupation: '@GUAROCC' @ ? ?Subjective:  ?Other (Lower back pain that radiates down left leg. Patient also reports balance issues, patient does report recent fall) ? ? ?History of Present Illness: [name] is a 76 y.o. male with history of psoriatic arthritis and psoriasis.  He was a started on Taltz in January 2023.  He is currently taking Taltz on monthly basis.  He continues to have some discomfort in his knee joints which are replaced.  He denies any discomfort in any other joints.  He has been having increased lower back pain.  He was evaluated by an orthopedic surgeon in [place] who diagnosed him with degenerative disc disease based on the x-rays.  He was given a prescription of Celebrex.  He did not take Celebrex because he is taking Eliquis.  He states he has been experiencing left-sided radiculopathy.  He also feels his balance is not good.  He had a recent fall about a month ago.  He had no injuries.  He still have few psoriasis lesions on his forearms. ? ?Activities of Daily Living:  ?Patient reports morning stiffness for 1-1.5 hours.   ?Patient Reports nocturnal pain.  ?Difficulty dressing/grooming: Denies ?Difficulty climbing stairs: Reports ?Difficulty getting out of chair: Reports ?Difficulty using hands for taps, buttons, cutlery, and/or writing: Reports ? ?Review of Systems  ?Constitutional:  Negative for fatigue.  ?HENT:  Positive for mouth dryness and nose dryness. Negative for mouth sores.   ?Eyes:  Positive for itching. Negative for pain and dryness.  ?Respiratory:  Negative for shortness of breath and difficulty breathing.   ?Cardiovascular:  Negative for chest pain and palpitations.  ?Gastrointestinal:  Negative for blood in stool, constipation and diarrhea.  ?Endocrine: Negative for  increased urination.  ?Genitourinary:  Negative for difficulty urinating.  ?Musculoskeletal:  Positive for morning stiffness. Negative for joint pain, joint pain and joint swelling.  ?Skin:  Negative for color change, rash and redness.  ?Allergic/Immunologic: Negative for susceptible to infections.  ?Neurological:  Positive for numbness and weakness. Negative for dizziness, headaches and memory loss.  ?Hematological:  Negative for bruising/bleeding tendency.  ?Psychiatric/Behavioral:  Negative for confusion.   ? ?PMFS History:  ?Patient Active Problem List  ? Diagnosis Date Noted  ? Lower urinary tract symptoms (LUTS) 07/03/2021  ? Vertigo 10/28/2019  ? CHF (congestive heart failure) ([place]) 11/01/2018  ? PTSD (post-traumatic stress disorder) 04/17/2018  ? Health care maintenance 01/30/2018  ? Mood change 01/30/2018  ? HLD (hyperlipidemia) 01/30/2018  ? B12 deficiency 11/13/2017  ? Neuropathy 10/16/2017  ? Hypogonadism in male 10/16/2017  ? High risk medication use 08/13/2017  ? History of total knee replacement, bilateral 08/13/2017  ? Pituitary adenoma ([place]) 05/11/2017  ? Vertebral artery stenosis 04/17/2017  ? Psoriasis 10/23/2016  ? Psoriatic arthritis ([place]) 10/23/2016  ? Restrictive lung disease 10/23/2016  ? Dyspnea 08/24/2016  ? Advance care planning 07/26/2014  ? Vitamin D deficiency, unspecified 03/11/2014  ? Raised level of immunoglobulins 02/23/2014  ? Arthropathy 01/24/2014  ? Restless legs syndrome 09/29/2013  ? 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 Kidney stone 06/15/2013  ? Atherosclerotic heart disease of  native coronary artery without angina pectoris 02/02/2013  ? Medicare annual wellness visit, subsequent 12/30/2012  ? Hematuria 12/30/2012  ? Skin lesion 12/30/2012  ? Carpal tunnel syndrome 06/23/2012  ? Obstructive sleep apnea 01/16/2012  ? Anemia in chronic illness 06/17/2011  ? Long term current use of anticoagulant 06/13/2011  ? Chronic diastolic heart  failure ([place]) 06/08/2011  ? Paroxysmal atrial fibrillation ([facility])   ? Essential (primary) hypertension   ? HYPERTENSION, BENIGN 04/11/2009  ? Paroxysmal ventricular tachycardia ([place]) 04/11/2009  ? ATRIAL FLUTTER 04/11/2009  ? Ventricular tachycardia ([place]) 04/11/2009  ?  ?Past Medical History:  ?Diagnosis Date  ? Allergic rhinitis   ? Anticoagulant long-term use   ? eliquis  ? Anxiety   ? Arthritis   ? WRISTS, KNEES, ANKLES  ? CAD (coronary artery disease) CARDIOLOGIST-  DR Cristopher Peru  ? Nonobstructive CAD by cath 2006;  HEART CATH AGAIN ON 06/08/13 AFTER CHEST DISCOMFORT / ADMISSION TO [place] - "MILD NON-OBSTRUCTIVE CAD, NORMAL LV SYSTOLIC FUNCTION"  ? Depression   ? Diastolic CHF [facility]) dx 05/9028--- cardiologist-  dr gregg taylor  ? EF 50-55%  per echo 05/2016  ? GERD (gastroesophageal reflux disease)   ? H/O cardiac radiofrequency ablation   ? History of kidney stones   ? HTN (hypertension)   ? Hyperlipidemia   ? OSA treated with BiPAP followed dr Elsworth Soho (previously dr clance)  ? per study 02-04-2012 very severe osa AHI 90/hr--  currently uses Bi-Pap every night per pt   ? Paroxysmal VT [facility]) cardiologist--  dr gregg taylor  ? 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 Persistent atrial fibrillation [facility]) cardiologist-  dr gregg taylor  ? dx 20010--  s/p  DCCV's 2013 & 2014 --  currently taking eliquis daily  ? PONV (postoperative nausea and vomiting)   ? Psoriasis   ? Psoriatic arthritis ([place])   ? rheumotologist-  dr s. Estanislado Pandy  ? PTSD (post-traumatic stress disorder)   ? Restless leg syndrome   ? Stroke [facility])   ? Type 2 diabetes mellitus ([place])   ? Vertigo   ? dx by Dr. [name] per patient   ?  ?Family History  ?Problem Relation Age of Onset  ? Cancer Mother   ?     benign tumor, died from surgery complications  ? Other Mother   ?     pituitary adenoma  ? Sudden death Father 47  ? Heart disease Father   ?     MI at 61  ? Heart disease Brother   ?     stents and PPM  ?  Parkinson's disease Brother   ? Heart disease Brother   ? Cancer Brother   ?     multiple myeloma  ? Sudden death Paternal Grandmother 37  ? Heart disease Maternal Uncle   ? Sudden death Paternal Uncle 5  ? Heart disease Paternal Uncle   ? COPD Paternal Uncle   ? Colon cancer Neg Hx   ? Prostate cancer Neg Hx   ? ?Past Surgical History:  ?Procedure Laterality Date  ? CARDIAC CATHETERIZATION  10-17-2004   dr bensimhon  ? nonsobstructive CAD, normal LVF, ef 65%  ? CARDIAC ELECTROPHYSIOLOGY STUDY AND ABLATION    ? CARDIOVERSION  08/23/2011  ? Procedure: CARDIOVERSION;  Surgeon: Evans Lance, MD;  Location: [place];  Service: Cardiovascular;  Laterality: N/A;  ? CARDIOVERSION N/A 01/22/2013  ? Procedure: CARDIOVERSION;  Surgeon: Evans Lance, MD;  Location: [place];  Service: Cardiovascular;  Laterality: N/A;  ? CATARACT EXTRACTION W/ INTRAOCULAR LENS  IMPLANT, BILATERAL  2013  ? CYSTOSCOPY W/ URETERAL STENT PLACEMENT Right 10/18/2014  ? Procedure: CYSTOSCOPY WITH RETROGRADE PYELOGRAM/URETERAL STENT PLACEMENT;  Surgeon: Festus Aloe, MD;  Location: [facility];  Service: Urology;  Laterality: Right;  ? CYSTOSCOPY WITH RETROGRADE PYELOGRAM, URETEROSCOPY AND STENT PLACEMENT Right 06/15/2013  ? Procedure: CYSTOSCOPY WITH RETROGRADE PYELOGRAM, URETEROSCOPY AND STENT PLACEMENT;  Surgeon: Bernestine Amass, MD;  Location: [facility];  Service: Urology;  Laterality: Right;  ? CYSTOSCOPY WITH RETROGRADE PYELOGRAM, URETEROSCOPY AND STENT PLACEMENT Right 02/18/2017  ? Procedure: CYSTOSCOPY WITH RIGHT RETROGRADE PYELOGRAM, URETEROSCOPY AND STENT PLACEMENT;  Surgeon: Lucas Mallow, MD;  Location: [facility];  Service: Urology;  Laterality: Right;  ? CYSTOSCOPY WITH STENT PLACEMENT Right 06/18/2014  ? Procedure: CYSTOSCOPY WITH  RIGHT RETROGRADE PYELOGRAM Caswell Corwin PLACEMENT ;  Surgeon: Raynelle Bring, MD;  Location: [facility];  Service: Urology;  Laterality: Right;  ? CYSTOSCOPY WITH URETEROSCOPY AND STENT PLACEMENT  Right 11/03/2014  ? Procedure: CYSTOSCOPY WITH RIGHT URETEROSCOPY AND  REMOVAL OF Sammie Bench   ;  Surgeon: Rana Snare, MD;  Location: [facility];  Service: Urology;  Laterality: Right;  ? HOLMIUM LASER APPLICATIO

## 2021-08-02 ENCOUNTER — Ambulatory Visit (INDEPENDENT_AMBULATORY_CARE_PROVIDER_SITE_OTHER): Payer: Medicare Other

## 2021-08-02 DIAGNOSIS — E538 Deficiency of other specified B group vitamins: Secondary | ICD-10-CM

## 2021-08-02 MED ORDER — CYANOCOBALAMIN 1000 MCG/ML IJ SOLN
1000.0000 ug | Freq: Once | INTRAMUSCULAR | Status: AC
  Administered 2021-08-02: 1000 ug via INTRAMUSCULAR

## 2021-08-02 NOTE — Progress Notes (Signed)
Per orders of Dr. Javier Gutierrez, injection of B-12 given by Rashidi Loh in right deltoid. Patient tolerated injection well.     

## 2021-08-15 ENCOUNTER — Ambulatory Visit (INDEPENDENT_AMBULATORY_CARE_PROVIDER_SITE_OTHER): Payer: Medicare Other | Admitting: Rheumatology

## 2021-08-15 ENCOUNTER — Encounter: Payer: Self-pay | Admitting: Rheumatology

## 2021-08-15 VITALS — BP 153/89 | HR 71 | Ht 69.5 in | Wt 269.4 lb

## 2021-08-15 DIAGNOSIS — L4050 Arthropathic psoriasis, unspecified: Secondary | ICD-10-CM | POA: Diagnosis not present

## 2021-08-15 DIAGNOSIS — L409 Psoriasis, unspecified: Secondary | ICD-10-CM | POA: Diagnosis not present

## 2021-08-15 DIAGNOSIS — M19072 Primary osteoarthritis, left ankle and foot: Secondary | ICD-10-CM

## 2021-08-15 DIAGNOSIS — Z8679 Personal history of other diseases of the circulatory system: Secondary | ICD-10-CM | POA: Diagnosis not present

## 2021-08-15 DIAGNOSIS — Z79899 Other long term (current) drug therapy: Secondary | ICD-10-CM | POA: Diagnosis not present

## 2021-08-15 DIAGNOSIS — M7662 Achilles tendinitis, left leg: Secondary | ICD-10-CM | POA: Diagnosis not present

## 2021-08-15 DIAGNOSIS — M19071 Primary osteoarthritis, right ankle and foot: Secondary | ICD-10-CM | POA: Diagnosis not present

## 2021-08-15 DIAGNOSIS — G2581 Restless legs syndrome: Secondary | ICD-10-CM

## 2021-08-15 DIAGNOSIS — M5136 Other intervertebral disc degeneration, lumbar region: Secondary | ICD-10-CM | POA: Diagnosis not present

## 2021-08-15 DIAGNOSIS — M19041 Primary osteoarthritis, right hand: Secondary | ICD-10-CM | POA: Diagnosis not present

## 2021-08-15 DIAGNOSIS — M19042 Primary osteoarthritis, left hand: Secondary | ICD-10-CM

## 2021-08-15 DIAGNOSIS — Z9181 History of falling: Secondary | ICD-10-CM | POA: Diagnosis not present

## 2021-08-15 DIAGNOSIS — Z7901 Long term (current) use of anticoagulants: Secondary | ICD-10-CM | POA: Diagnosis not present

## 2021-08-15 DIAGNOSIS — Z8669 Personal history of other diseases of the nervous system and sense organs: Secondary | ICD-10-CM

## 2021-08-15 DIAGNOSIS — Z96653 Presence of artificial knee joint, bilateral: Secondary | ICD-10-CM

## 2021-08-15 DIAGNOSIS — I472 Ventricular tachycardia, unspecified: Secondary | ICD-10-CM

## 2021-08-15 DIAGNOSIS — M722 Plantar fascial fibromatosis: Secondary | ICD-10-CM | POA: Diagnosis not present

## 2021-08-15 DIAGNOSIS — Z8639 Personal history of other endocrine, nutritional and metabolic disease: Secondary | ICD-10-CM

## 2021-08-15 DIAGNOSIS — Z87448 Personal history of other diseases of urinary system: Secondary | ICD-10-CM

## 2021-08-15 NOTE — Patient Instructions (Signed)
Standing Labs We placed an order today for your standing lab work.   Please have your standing labs drawn in August  and every 3 months  If possible, please have your labs drawn 2 weeks prior to your appointment so that the provider can discuss your results at your appointment.  Please note that you may see your imaging and lab results in MyChart before we have reviewed them. We may be awaiting multiple results to interpret others before contacting you. Please allow our office up to 72 hours to thoroughly review all of the results before contacting the office for clarification of your results.  We have open lab daily: Monday through Thursday from 1:30-4:30 PM and Friday from 1:30-4:00 PM at the office of Dr. Selita Staiger, [facility].   Please be advised, all patients with office appointments requiring lab work will take precedent over walk-in lab work.  If possible, please come for your lab work on Monday and Friday afternoons, as you may experience shorter wait times. The office is located at [address], [address], [place],  [address] No appointment is necessary.   Labs are drawn by [facility]. Please bring your co-pay at the time of your lab draw.  You may receive a bill from [facility] for your lab work.  Please note if you are on Hydroxychloroquine and and an order has been placed for a Hydroxychloroquine level, you will need to have it drawn 4 hours or more after your last dose.  If you wish to have your labs drawn at another location, please call the office 24 hours in advance to send orders.  If you have any questions regarding directions or hours of operation,  please call [phone].   As a reminder, please drink plenty of water prior to coming for your lab work. Thanks!   Vaccines You are taking a medication(s) that can suppress your immune system.  The following immunizations are recommended: Flu annually Covid-19  Td/Tdap (tetanus, diphtheria,  pertussis) every 10 years Pneumonia (Prevnar 15 then Pneumovax 23 at least 1 year apart.  Alternatively, can take Prevnar 20 without needing additional dose) Shingrix: 2 doses from 4 weeks to 6 months apart  Please check with your PCP to make sure you are up to date.   If you have signs or symptoms of an infection or start antibiotics: First, call your PCP for workup of your infection. Hold your medication through the infection, until you complete your antibiotics, and until symptoms resolve if you take the following: Injectable medication (Actemra, Benlysta, Cimzia, Cosentyx, Enbrel, Humira, Kevzara, Orencia, Remicade, Simponi, Stelara, Taltz, Tremfya) Methotrexate Leflunomide (Arava) Mycophenolate (Cellcept) Xeljanz, Olumiant, or Rinvoq  

## 2021-08-16 ENCOUNTER — Telehealth: Payer: Self-pay | Admitting: Rheumatology

## 2021-08-16 LAB — CBC WITH DIFFERENTIAL/PLATELET
Absolute Monocytes: 450 cells/uL (ref 200–950)
Basophils Absolute: 38 cells/uL (ref 0–200)
Basophils Relative: 0.5 %
Eosinophils Absolute: 308 cells/uL (ref 15–500)
Eosinophils Relative: 4.1 %
HCT: 34.1 % — ABNORMAL LOW (ref 38.5–50.0)
Hemoglobin: 11.1 g/dL — ABNORMAL LOW (ref 13.2–17.1)
Lymphs Abs: 1418 cells/uL (ref 850–3900)
MCH: 30.1 pg (ref 27.0–33.0)
MCHC: 32.6 g/dL (ref 32.0–36.0)
MCV: 92.4 fL (ref 80.0–100.0)
MPV: 11.1 fL (ref 7.5–12.5)
Monocytes Relative: 6 %
Neutro Abs: 5288 cells/uL (ref 1500–7800)
Neutrophils Relative %: 70.5 %
Platelets: 199 10*3/uL (ref 140–400)
RBC: 3.69 10*6/uL — ABNORMAL LOW (ref 4.20–5.80)
RDW: 13.4 % (ref 11.0–15.0)
Total Lymphocyte: 18.9 %
WBC: 7.5 10*3/uL (ref 3.8–10.8)

## 2021-08-16 LAB — COMPLETE METABOLIC PANEL WITH GFR
AG Ratio: 1.2 (calc) (ref 1.0–2.5)
ALT: 11 U/L (ref 9–46)
AST: 13 U/L (ref 10–35)
Albumin: 4.1 g/dL (ref 3.6–5.1)
Alkaline phosphatase (APISO): 123 U/L (ref 35–144)
BUN/Creatinine Ratio: 20 (calc) (ref 6–22)
BUN: 27 mg/dL — ABNORMAL HIGH (ref 7–25)
CO2: 25 mmol/L (ref 20–32)
Calcium: 9.4 mg/dL (ref 8.6–10.3)
Chloride: 106 mmol/L (ref 98–110)
Creat: 1.36 mg/dL — ABNORMAL HIGH (ref 0.70–1.28)
Globulin: 3.3 g/dL (calc) (ref 1.9–3.7)
Glucose, Bld: 185 mg/dL — ABNORMAL HIGH (ref 65–99)
Potassium: 4.5 mmol/L (ref 3.5–5.3)
Sodium: 140 mmol/L (ref 135–146)
Total Bilirubin: 0.7 mg/dL (ref 0.2–1.2)
Total Protein: 7.4 g/dL (ref 6.1–8.1)
eGFR: 54 mL/min/{1.73_m2} — ABNORMAL LOW (ref >=60)

## 2021-08-16 NOTE — Telephone Encounter (Signed)
See lab note for details.  

## 2021-08-16 NOTE — Progress Notes (Signed)
Hemoglobin is low and stable.  Glucose is elevated.  Creatinine is elevated most likely due to diuretic use.  No change in treatment advised.

## 2021-08-16 NOTE — Telephone Encounter (Signed)
Patient called the office requesting a call back regarding labs. ?

## 2021-08-24 ENCOUNTER — Telehealth: Payer: Self-pay | Admitting: Rheumatology

## 2021-08-24 NOTE — Telephone Encounter (Signed)
Patient called the office stating he was referred for physical therapy by Dr. Estanislado Pandy and they contacted him stating they needed the orders for the PT sent to them before he could be seen.

## 2021-08-24 NOTE — Telephone Encounter (Signed)
I called patient, PT order in Epic at [facility], patient will call to schedule appt.
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
## 2021-09-06 ENCOUNTER — Ambulatory Visit (INDEPENDENT_AMBULATORY_CARE_PROVIDER_SITE_OTHER): Payer: Medicare Other

## 2021-09-06 ENCOUNTER — Encounter: Payer: Self-pay | Admitting: Physical Therapy

## 2021-09-06 ENCOUNTER — Ambulatory Visit: Payer: Medicare Other | Attending: Rheumatology | Admitting: Physical Therapy

## 2021-09-06 DIAGNOSIS — M5459 Other low back pain: Secondary | ICD-10-CM | POA: Insufficient documentation

## 2021-09-06 DIAGNOSIS — Z9181 History of falling: Secondary | ICD-10-CM | POA: Diagnosis not present

## 2021-09-06 DIAGNOSIS — M5136 Other intervertebral disc degeneration, lumbar region: Secondary | ICD-10-CM | POA: Diagnosis not present

## 2021-09-06 DIAGNOSIS — R2681 Unsteadiness on feet: Secondary | ICD-10-CM | POA: Insufficient documentation

## 2021-09-06 DIAGNOSIS — R262 Difficulty in walking, not elsewhere classified: Secondary | ICD-10-CM | POA: Diagnosis not present

## 2021-09-06 DIAGNOSIS — E538 Deficiency of other specified B group vitamins: Secondary | ICD-10-CM | POA: Diagnosis not present

## 2021-09-06 DIAGNOSIS — M5432 Sciatica, left side: Secondary | ICD-10-CM | POA: Diagnosis not present

## 2021-09-06 MED ORDER — CYANOCOBALAMIN 1000 MCG/ML IJ SOLN
1000.0000 ug | Freq: Once | INTRAMUSCULAR | Status: AC
  Administered 2021-09-06: 1000 ug via INTRAMUSCULAR

## 2021-09-06 NOTE — Progress Notes (Signed)
Per orders of Dr. Javier Gutierrez, injection of B-12 given by Arrington Bencomo in left deltoid. Patient tolerated injection well.    

## 2021-09-11 ENCOUNTER — Encounter: Payer: Self-pay | Admitting: Physical Therapy

## 2021-09-11 ENCOUNTER — Ambulatory Visit: Payer: Medicare Other | Admitting: Physical Therapy

## 2021-09-11 VITALS — BP 148/78 | HR 55

## 2021-09-11 DIAGNOSIS — Z9181 History of falling: Secondary | ICD-10-CM | POA: Diagnosis not present

## 2021-09-11 DIAGNOSIS — R262 Difficulty in walking, not elsewhere classified: Secondary | ICD-10-CM

## 2021-09-11 DIAGNOSIS — M5459 Other low back pain: Secondary | ICD-10-CM

## 2021-09-11 DIAGNOSIS — M5136 Other intervertebral disc degeneration, lumbar region: Secondary | ICD-10-CM | POA: Diagnosis not present

## 2021-09-11 DIAGNOSIS — R2681 Unsteadiness on feet: Secondary | ICD-10-CM

## 2021-09-11 DIAGNOSIS — M5432 Sciatica, left side: Secondary | ICD-10-CM
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
## 2021-09-14 ENCOUNTER — Ambulatory Visit: Payer: Medicare Other | Admitting: Physical Therapy

## 2021-09-14 ENCOUNTER — Encounter: Payer: Self-pay | Admitting: Physical Therapy

## 2021-09-14 DIAGNOSIS — M5136 Other intervertebral disc degeneration, lumbar region: Secondary | ICD-10-CM | POA: Diagnosis not present

## 2021-09-14 DIAGNOSIS — M5459 Other low back pain: Secondary | ICD-10-CM

## 2021-09-14 DIAGNOSIS — Z9181 History of falling: Secondary | ICD-10-CM | POA: Diagnosis not present

## 2021-09-14 DIAGNOSIS — R262 Difficulty in walking, not elsewhere classified: Secondary | ICD-10-CM | POA: Diagnosis not present

## 2021-09-14 DIAGNOSIS — R2681 Unsteadiness on feet: Secondary | ICD-10-CM

## 2021-09-14 DIAGNOSIS — M5432 Sciatica, left side: Secondary | ICD-10-CM
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
## 2021-09-19 ENCOUNTER — Ambulatory Visit: Payer: Medicare Other | Admitting: Physical Therapy

## 2021-09-19 ENCOUNTER — Encounter: Payer: Self-pay | Admitting: Physical Therapy

## 2021-09-19 DIAGNOSIS — R262 Difficulty in walking, not elsewhere classified: Secondary | ICD-10-CM | POA: Diagnosis not present

## 2021-09-19 DIAGNOSIS — M5459 Other low back pain: Secondary | ICD-10-CM | POA: Diagnosis not present

## 2021-09-19 DIAGNOSIS — M5432 Sciatica, left side: Secondary | ICD-10-CM | POA: Diagnosis not present

## 2021-09-19 DIAGNOSIS — R2681 Unsteadiness on feet: Secondary | ICD-10-CM | POA: Diagnosis not present

## 2021-09-19 DIAGNOSIS — M5136 Other intervertebral disc degeneration, lumbar region: Secondary | ICD-10-CM | POA: Diagnosis not present

## 2021-09-19 DIAGNOSIS — Z9181 History of falling: Secondary | ICD-10-CM
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
## 2021-09-26 ENCOUNTER — Ambulatory Visit: Payer: Medicare Other

## 2021-09-26 DIAGNOSIS — M5432 Sciatica, left side: Secondary | ICD-10-CM | POA: Diagnosis not present

## 2021-09-26 DIAGNOSIS — R2681 Unsteadiness on feet: Secondary | ICD-10-CM

## 2021-09-26 DIAGNOSIS — R262 Difficulty in walking, not elsewhere classified: Secondary | ICD-10-CM

## 2021-09-26 DIAGNOSIS — M5136 Other intervertebral disc degeneration, lumbar region: Secondary | ICD-10-CM | POA: Diagnosis not present

## 2021-09-26 DIAGNOSIS — Z9181 History of falling: Secondary | ICD-10-CM | POA: Diagnosis not present

## 2021-09-26 DIAGNOSIS — M5459 Other low back pain: Secondary | ICD-10-CM | POA: Diagnosis not present

## 2021-09-29 ENCOUNTER — Other Ambulatory Visit: Payer: Self-pay | Admitting: Physician Assistant

## 2021-09-29 DIAGNOSIS — L409 Psoriasis, unspecified: Secondary | ICD-10-CM

## 2021-09-29 DIAGNOSIS — Z79899 Other long term (current) drug therapy: Secondary | ICD-10-CM

## 2021-09-29 DIAGNOSIS — L4050 Arthropathic psoriasis, unspecified: Secondary | ICD-10-CM

## 2021-09-29 NOTE — Telephone Encounter (Signed)
Next Visit: 11/15/2021  Last Visit: 08/15/2021  Last Fill: 07/04/2021  [id number] arthritis   Current Dose per office note 08/15/2021: Taltz 80 mg subcu every 28 days  Labs: 08/15/2021 Hemoglobin is low and stable.  Glucose is elevated.  Creatinine is elevated   TB Gold: 03/06/2021 Neg    Okay to refill Taltz?

## 2021-10-04 ENCOUNTER — Encounter: Payer: Medicare Other | Admitting: Physical Therapy

## 2021-10-09 ENCOUNTER — Ambulatory Visit: Payer: Medicare Other | Attending: Rheumatology | Admitting: Physical Therapy

## 2021-10-09 ENCOUNTER — Telehealth: Payer: Self-pay | Admitting: Physical Therapy

## 2021-10-09 DIAGNOSIS — Z9181 History of falling: Secondary | ICD-10-CM | POA: Insufficient documentation

## 2021-10-09 DIAGNOSIS — M5459 Other low back pain: Secondary | ICD-10-CM | POA: Insufficient documentation

## 2021-10-09 DIAGNOSIS — R262 Difficulty in walking, not elsewhere classified: Secondary | ICD-10-CM | POA: Insufficient documentation

## 2021-10-09 DIAGNOSIS — R2681 Unsteadiness on feet: Secondary | ICD-10-CM | POA: Insufficient documentation

## 2021-10-09 DIAGNOSIS — M5432 Sciatica, left side: Secondary | ICD-10-CM | POA: Insufficient documentation

## 2021-10-09 NOTE — Telephone Encounter (Signed)
Called patient when he did not show up for his appointment scheduled at 2:30pm today. Notified patient of missed appointment and requested call back.   Everlean Alstrom. Graylon Good, PT, DPT 10/09/21, 2:49 PM  [facility] [address] [place], [place] [address] P: [phone] I F: [phone]

## 2021-10-10 ENCOUNTER — Encounter: Payer: Self-pay | Admitting: Physical Therapy

## 2021-10-10 ENCOUNTER — Ambulatory Visit (INDEPENDENT_AMBULATORY_CARE_PROVIDER_SITE_OTHER): Payer: Medicare Other | Admitting: *Deleted

## 2021-10-10 ENCOUNTER — Ambulatory Visit: Payer: Medicare Other | Admitting: Physical Therapy

## 2021-10-10 DIAGNOSIS — M5432 Sciatica, left side: Secondary | ICD-10-CM

## 2021-10-10 DIAGNOSIS — R262 Difficulty in walking, not elsewhere classified: Secondary | ICD-10-CM | POA: Diagnosis not present

## 2021-10-10 DIAGNOSIS — R2681 Unsteadiness on feet: Secondary | ICD-10-CM | POA: Diagnosis not present

## 2021-10-10 DIAGNOSIS — E538 Deficiency of other specified B group vitamins: Secondary | ICD-10-CM | POA: Diagnosis not present

## 2021-10-10 DIAGNOSIS — M5459 Other low back pain: Secondary | ICD-10-CM

## 2021-10-10 DIAGNOSIS — Z9181 History of falling: Secondary | ICD-10-CM

## 2021-10-10 MED ORDER — CYANOCOBALAMIN 1000 MCG/ML IJ SOLN
1000.0000 ug | Freq: Once | INTRAMUSCULAR | Status: AC
  Administered 2021-10-10: 1000 ug via INTRAMUSCULAR
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
## 2021-10-10 NOTE — Progress Notes (Signed)
Per orders of Dr. Duncan, injection of vitamin B 12 given by Inola Lisle. Patient tolerated injection well.  

## 2021-10-16 ENCOUNTER — Ambulatory Visit: Payer: Medicare Other | Admitting: Physical Therapy

## 2021-10-16 ENCOUNTER — Encounter: Payer: Self-pay | Admitting: Physical Therapy

## 2021-10-16 DIAGNOSIS — M5432 Sciatica, left side: Secondary | ICD-10-CM | POA: Diagnosis not present

## 2021-10-16 DIAGNOSIS — Z9181 History of falling: Secondary | ICD-10-CM

## 2021-10-16 DIAGNOSIS — R2681 Unsteadiness on feet: Secondary | ICD-10-CM | POA: Diagnosis not present

## 2021-10-16 DIAGNOSIS — R262 Difficulty in walking, not elsewhere classified: Secondary | ICD-10-CM | POA: Diagnosis not present

## 2021-10-16 DIAGNOSIS — M5459 Other low back pain: Secondary | ICD-10-CM | POA: Diagnosis not present
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
## 2021-10-19 ENCOUNTER — Ambulatory Visit: Payer: Medicare Other | Admitting: Physical Therapy

## 2021-10-19 ENCOUNTER — Encounter: Payer: Self-pay | Admitting: Physical Therapy

## 2021-10-19 DIAGNOSIS — Z9181 History of falling: Secondary | ICD-10-CM | POA: Diagnosis not present

## 2021-10-19 DIAGNOSIS — R2681 Unsteadiness on feet: Secondary | ICD-10-CM

## 2021-10-19 DIAGNOSIS — R262 Difficulty in walking, not elsewhere classified: Secondary | ICD-10-CM | POA: Diagnosis not present

## 2021-10-19 DIAGNOSIS — M5432 Sciatica, left side: Secondary | ICD-10-CM | POA: Diagnosis not present

## 2021-10-19 DIAGNOSIS — M5459 Other low back pain: Secondary | ICD-10-CM | POA: Diagnosis not present
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
## 2021-10-26 ENCOUNTER — Ambulatory Visit: Payer: Medicare Other | Admitting: Physical Therapy

## 2021-10-26 ENCOUNTER — Encounter: Payer: Self-pay | Admitting: Physical Therapy

## 2021-10-26 VITALS — BP 123/60 | HR 61

## 2021-10-26 DIAGNOSIS — R2681 Unsteadiness on feet: Secondary | ICD-10-CM | POA: Diagnosis not present

## 2021-10-26 DIAGNOSIS — R262 Difficulty in walking, not elsewhere classified: Secondary | ICD-10-CM

## 2021-10-26 DIAGNOSIS — Z9181 History of falling: Secondary | ICD-10-CM | POA: Diagnosis not present

## 2021-10-26 DIAGNOSIS — M5459 Other low back pain: Secondary | ICD-10-CM

## 2021-10-26 DIAGNOSIS — M5432 Sciatica, left side: Secondary | ICD-10-CM | POA: Diagnosis not present
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
## 2021-10-31 ENCOUNTER — Ambulatory Visit: Payer: Medicare Other | Attending: Rheumatology

## 2021-10-31 DIAGNOSIS — R262 Difficulty in walking, not elsewhere classified: Secondary | ICD-10-CM | POA: Insufficient documentation

## 2021-10-31 DIAGNOSIS — Z9181 History of falling: Secondary | ICD-10-CM | POA: Diagnosis not present

## 2021-10-31 DIAGNOSIS — R2681 Unsteadiness on feet: Secondary | ICD-10-CM | POA: Insufficient documentation

## 2021-10-31 DIAGNOSIS — M5432 Sciatica, left side: Secondary | ICD-10-CM | POA: Insufficient documentation

## 2021-10-31 DIAGNOSIS — M5459 Other low back pain: Secondary | ICD-10-CM | POA: Diagnosis not present
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
## 2021-11-01 ENCOUNTER — Encounter: Payer: Self-pay | Admitting: Physical Therapy

## 2021-11-01 ENCOUNTER — Ambulatory Visit: Payer: Medicare Other | Admitting: Physical Therapy

## 2021-11-01 VITALS — BP 147/68 | HR 55

## 2021-11-01 DIAGNOSIS — R2681 Unsteadiness on feet: Secondary | ICD-10-CM | POA: Diagnosis not present

## 2021-11-01 DIAGNOSIS — Z9181 History of falling: Secondary | ICD-10-CM | POA: Diagnosis not present

## 2021-11-01 DIAGNOSIS — M5432 Sciatica, left side: Secondary | ICD-10-CM | POA: Diagnosis not present

## 2021-11-01 DIAGNOSIS — M5459 Other low back pain: Secondary | ICD-10-CM

## 2021-11-01 DIAGNOSIS — R262 Difficulty in walking, not elsewhere classified: Secondary | ICD-10-CM | POA: Diagnosis not present
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
## 2021-11-06 ENCOUNTER — Ambulatory Visit: Payer: Medicare Other | Admitting: Physical Therapy

## 2021-11-06 ENCOUNTER — Encounter: Payer: Self-pay | Admitting: Physical Therapy

## 2021-11-06 DIAGNOSIS — M5459 Other low back pain: Secondary | ICD-10-CM

## 2021-11-06 DIAGNOSIS — M5432 Sciatica, left side: Secondary | ICD-10-CM

## 2021-11-06 DIAGNOSIS — R2681 Unsteadiness on feet: Secondary | ICD-10-CM

## 2021-11-06 DIAGNOSIS — Z9181 History of falling: Secondary | ICD-10-CM | POA: Diagnosis not present

## 2021-11-06 DIAGNOSIS — R262 Difficulty in walking, not elsewhere classified: Secondary | ICD-10-CM
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
## 2021-11-08 ENCOUNTER — Ambulatory Visit: Payer: Medicare Other | Admitting: Physical Therapy

## 2021-11-08 ENCOUNTER — Encounter: Payer: Self-pay | Admitting: Physical Therapy

## 2021-11-08 DIAGNOSIS — R2681 Unsteadiness on feet: Secondary | ICD-10-CM | POA: Diagnosis not present

## 2021-11-08 DIAGNOSIS — M5432 Sciatica, left side: Secondary | ICD-10-CM | POA: Diagnosis not present

## 2021-11-08 DIAGNOSIS — Z9181 History of falling: Secondary | ICD-10-CM | POA: Diagnosis not present

## 2021-11-08 DIAGNOSIS — R262 Difficulty in walking, not elsewhere classified: Secondary | ICD-10-CM | POA: Diagnosis not present

## 2021-11-08 DIAGNOSIS — M5459 Other low back pain: Secondary | ICD-10-CM
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
## 2021-11-14 ENCOUNTER — Telehealth: Payer: Self-pay | Admitting: Physical Therapy

## 2021-11-14 ENCOUNTER — Ambulatory Visit: Payer: Medicare Other | Admitting: Physical Therapy

## 2021-11-14 ENCOUNTER — Ambulatory Visit (INDEPENDENT_AMBULATORY_CARE_PROVIDER_SITE_OTHER): Payer: Medicare Other

## 2021-11-14 DIAGNOSIS — E538 Deficiency of other specified B group vitamins: Secondary | ICD-10-CM

## 2021-11-14 MED ORDER — CYANOCOBALAMIN 1000 MCG/ML IJ SOLN
1000.0000 ug | Freq: Once | INTRAMUSCULAR | Status: AC
  Administered 2021-11-14: 1000 ug via INTRAMUSCULAR

## 2021-11-14 NOTE — Telephone Encounter (Signed)
Called patient when he did not show up for his appointment scheduled for 11:15am today. Left VM notifying him of missed visit and next scheduled visit at 4pm on Thursday 11/16/2021.   Everlean Alstrom. Graylon Good, PT, DPT 11/14/21, 11:38 AM  [facility] [address] [place], [place] [address] P: [phone] I F: [phone]

## 2021-11-14 NOTE — Progress Notes (Signed)
Per orders of Dr. [name], injection of B12 monthly given by Kris Mouton. Patient tolerated injection well.

## 2021-11-15 ENCOUNTER — Encounter: Payer: Self-pay | Admitting: Physician Assistant

## 2021-11-15 ENCOUNTER — Ambulatory Visit: Payer: Medicare Other | Attending: Physician Assistant | Admitting: Physician Assistant

## 2021-11-15 VITALS — BP 147/78 | HR 73 | Resp 18 | Ht 69.5 in | Wt 279.0 lb

## 2021-11-15 DIAGNOSIS — M19042 Primary osteoarthritis, left hand: Secondary | ICD-10-CM | POA: Diagnosis not present

## 2021-11-15 DIAGNOSIS — Z8639 Personal history of other endocrine, nutritional and metabolic disease: Secondary | ICD-10-CM | POA: Diagnosis not present

## 2021-11-15 DIAGNOSIS — G2581 Restless legs syndrome: Secondary | ICD-10-CM | POA: Insufficient documentation

## 2021-11-15 DIAGNOSIS — Z8669 Personal history of other diseases of the nervous system and sense organs: Secondary | ICD-10-CM | POA: Diagnosis not present

## 2021-11-15 DIAGNOSIS — I472 Ventricular tachycardia, unspecified: Secondary | ICD-10-CM | POA: Diagnosis not present

## 2021-11-15 DIAGNOSIS — Z87448 Personal history of other diseases of urinary system: Secondary | ICD-10-CM | POA: Diagnosis not present

## 2021-11-15 DIAGNOSIS — M5136 Other intervertebral disc degeneration, lumbar region: Secondary | ICD-10-CM | POA: Insufficient documentation

## 2021-11-15 DIAGNOSIS — Z96653 Presence of artificial knee joint, bilateral: Secondary | ICD-10-CM | POA: Insufficient documentation

## 2021-11-15 DIAGNOSIS — M19041 Primary osteoarthritis, right hand: Secondary | ICD-10-CM | POA: Insufficient documentation

## 2021-11-15 DIAGNOSIS — M19071 Primary osteoarthritis, right ankle and foot: Secondary | ICD-10-CM | POA: Insufficient documentation

## 2021-11-15 DIAGNOSIS — Z7901 Long term (current) use of anticoagulants: Secondary | ICD-10-CM | POA: Diagnosis not present

## 2021-11-15 DIAGNOSIS — Z79899 Other long term (current) drug therapy: Secondary | ICD-10-CM | POA: Insufficient documentation

## 2021-11-15 DIAGNOSIS — M19072 Primary osteoarthritis, left ankle and foot: Secondary | ICD-10-CM | POA: Diagnosis not present

## 2021-11-15 DIAGNOSIS — L409 Psoriasis, unspecified: Secondary | ICD-10-CM | POA: Insufficient documentation

## 2021-11-15 DIAGNOSIS — L4050 Arthropathic psoriasis, unspecified: Secondary | ICD-10-CM | POA: Diagnosis not present

## 2021-11-15 DIAGNOSIS — M722 Plantar fascial fibromatosis: Secondary | ICD-10-CM | POA: Insufficient documentation

## 2021-11-15 DIAGNOSIS — M7662 Achilles tendinitis, left leg: Secondary | ICD-10-CM | POA: Insufficient documentation

## 2021-11-15 DIAGNOSIS — Z8679 Personal history of other diseases of the circulatory system: Secondary | ICD-10-CM | POA: Insufficient documentation

## 2021-11-15 DIAGNOSIS — Z9181 History of falling: Secondary | ICD-10-CM | POA: Insufficient documentation

## 2021-11-16 ENCOUNTER — Encounter: Payer: Self-pay | Admitting: Physical Therapy

## 2021-11-16 ENCOUNTER — Ambulatory Visit: Payer: Medicare Other | Admitting: Physical Therapy

## 2021-11-16 DIAGNOSIS — Z9181 History of falling: Secondary | ICD-10-CM | POA: Diagnosis not present

## 2021-11-16 DIAGNOSIS — M5432 Sciatica, left side: Secondary | ICD-10-CM

## 2021-11-16 DIAGNOSIS — R262 Difficulty in walking, not elsewhere classified: Secondary | ICD-10-CM

## 2021-11-16 DIAGNOSIS — R2681 Unsteadiness on feet: Secondary | ICD-10-CM | POA: Diagnosis not present

## 2021-11-16 DIAGNOSIS — M5459 Other low back pain: Secondary | ICD-10-CM

## 2021-11-16 LAB — CBC WITH DIFFERENTIAL/PLATELET
Absolute Monocytes: 403 cells/uL (ref 200–950)
Basophils Absolute: 43 cells/uL (ref 0–200)
Basophils Relative: 0.6 %
Eosinophils Absolute: 230 cells/uL (ref 15–500)
Eosinophils Relative: 3.2 %
HCT: 33 % — ABNORMAL LOW (ref 38.5–50.0)
Hemoglobin: 11 g/dL — ABNORMAL LOW (ref 13.2–17.1)
Lymphs Abs: 1282 cells/uL (ref 850–3900)
MCH: 31.2 pg (ref 27.0–33.0)
MCHC: 33.3 g/dL (ref 32.0–36.0)
MCV: 93.5 fL (ref 80.0–100.0)
MPV: 10.9 fL (ref 7.5–12.5)
Monocytes Relative: 5.6 %
Neutro Abs: 5242 cells/uL (ref 1500–7800)
Neutrophils Relative %: 72.8 %
Platelets: 193 10*3/uL (ref 140–400)
RBC: 3.53 10*6/uL — ABNORMAL LOW (ref 4.20–5.80)
RDW: 12.7 % (ref 11.0–15.0)
Total Lymphocyte: 17.8 %
WBC: 7.2 10*3/uL (ref 3.8–10.8)

## 2021-11-16 LAB — COMPLETE METABOLIC PANEL WITH GFR
AG Ratio: 1.2 (calc) (ref 1.0–2.5)
ALT: 12 U/L (ref 9–46)
AST: 11 U/L (ref 10–35)
Albumin: 4 g/dL (ref 3.6–5.1)
Alkaline phosphatase (APISO): 112 U/L (ref 35–144)
BUN/Creatinine Ratio: 19 (calc) (ref 6–22)
BUN: 27 mg/dL — ABNORMAL HIGH (ref 7–25)
CO2: 25 mmol/L (ref 20–32)
Calcium: 9.6 mg/dL (ref 8.6–10.3)
Chloride: 105 mmol/L (ref 98–110)
Creat: 1.44 mg/dL — ABNORMAL HIGH (ref 0.70–1.28)
Globulin: 3.4 g/dL (calc) (ref 1.9–3.7)
Glucose, Bld: 283 mg/dL — ABNORMAL HIGH (ref 65–99)
Potassium: 4.1 mmol/L (ref 3.5–5.3)
Sodium: 141 mmol/L (ref 135–146)
Total Bilirubin: 0.8 mg/dL (ref 0.2–1.2)
Total Protein: 7.4 g/dL (ref 6.1–8.1)
eGFR: 51 mL/min/{1.73_m2} — ABNORMAL LOW (ref >=60)

## 2021-11-16 NOTE — Progress Notes (Signed)
RBC count, hgb, and hct remain low and are trending down slowly.   Glucose is elevated-283. Creatinine is elevated-1.44 and GFR is low-51. Please clarify if he has been taking any NSAIDs?  Could be due to torsemide use. Please forward results to PCP.
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
## 2021-11-19 ENCOUNTER — Other Ambulatory Visit: Payer: Self-pay | Admitting: Family Medicine

## 2021-11-19 ENCOUNTER — Telehealth: Payer: Self-pay | Admitting: Family Medicine

## 2021-11-19 DIAGNOSIS — E1149 Type 2 diabetes mellitus with other diabetic neurological complication: Secondary | ICD-10-CM

## 2021-11-19 NOTE — Telephone Encounter (Signed)
Please update patient.  I saw his recent labs.  I would not use any NSAIDs.  We can recheck his kidney function when he comes in for follow-up next month.  I put in the follow-up lab orders.  Thanks.

## 2021-11-20 ENCOUNTER — Encounter: Payer: Self-pay | Admitting: Physical Therapy

## 2021-11-20 ENCOUNTER — Ambulatory Visit: Payer: Medicare Other | Admitting: Physical Therapy

## 2021-11-20 DIAGNOSIS — Z9181 History of falling: Secondary | ICD-10-CM | POA: Diagnosis not present

## 2021-11-20 DIAGNOSIS — R2681 Unsteadiness on feet: Secondary | ICD-10-CM | POA: Diagnosis not present

## 2021-11-20 DIAGNOSIS — R262 Difficulty in walking, not elsewhere classified: Secondary | ICD-10-CM

## 2021-11-20 DIAGNOSIS — M5432 Sciatica, left side: Secondary | ICD-10-CM | POA: Diagnosis not present

## 2021-11-20 DIAGNOSIS — M5459 Other low back pain: Secondary | ICD-10-CM

## 2021-11-20 NOTE — Telephone Encounter (Signed)
Spoke with patient and advised to not use any NSAIDS and will recheck kidney function next month. Patient verbalized understanding.
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
## 2021-11-22 ENCOUNTER — Ambulatory Visit: Payer: Medicare Other | Admitting: Physical Therapy

## 2021-11-22 ENCOUNTER — Encounter: Payer: Self-pay | Admitting: Physical Therapy

## 2021-11-22 DIAGNOSIS — R262 Difficulty in walking, not elsewhere classified: Secondary | ICD-10-CM

## 2021-11-22 DIAGNOSIS — M5432 Sciatica, left side: Secondary | ICD-10-CM | POA: Diagnosis not present

## 2021-11-22 DIAGNOSIS — R2681 Unsteadiness on feet: Secondary | ICD-10-CM | POA: Diagnosis not present

## 2021-11-22 DIAGNOSIS — M5459 Other low back pain: Secondary | ICD-10-CM

## 2021-11-22 DIAGNOSIS — Z9181 History of falling: Secondary | ICD-10-CM | POA: Diagnosis not present
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
## 2021-11-30 ENCOUNTER — Ambulatory Visit: Payer: Medicare Other | Admitting: Physical Therapy

## 2021-11-30 ENCOUNTER — Telehealth: Payer: Self-pay | Admitting: Physical Therapy

## 2021-11-30 NOTE — Telephone Encounter (Signed)
Called patient after he did not show up for his appointment scheduled for today at 2:30pm. Left VM notifying him of the missed visit and his next scheduled visit on Wed 12/06/2021 at [place]. Graylon Good, PT, DPT 11/30/21, 7:48 PM  [facility] [address] [place], [place] [address] P: [phone] I F: [phone]

## 2021-12-06 ENCOUNTER — Ambulatory Visit: Payer: Medicare Other | Attending: Rheumatology | Admitting: Physical Therapy

## 2021-12-06 ENCOUNTER — Encounter: Payer: Self-pay | Admitting: Physical Therapy

## 2021-12-06 DIAGNOSIS — Z9181 History of falling: Secondary | ICD-10-CM | POA: Diagnosis not present

## 2021-12-06 DIAGNOSIS — R262 Difficulty in walking, not elsewhere classified: Secondary | ICD-10-CM | POA: Diagnosis not present

## 2021-12-06 DIAGNOSIS — M5432 Sciatica, left side: Secondary | ICD-10-CM | POA: Diagnosis not present

## 2021-12-06 DIAGNOSIS — M5459 Other low back pain: Secondary | ICD-10-CM | POA: Insufficient documentation

## 2021-12-06 DIAGNOSIS — R2681 Unsteadiness on feet: Secondary | ICD-10-CM | POA: Insufficient documentation
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
## 2021-12-12 ENCOUNTER — Encounter: Payer: Self-pay | Admitting: Physical Therapy

## 2021-12-12 ENCOUNTER — Ambulatory Visit: Payer: Medicare Other | Admitting: Physical Therapy

## 2021-12-12 DIAGNOSIS — R262 Difficulty in walking, not elsewhere classified: Secondary | ICD-10-CM

## 2021-12-12 DIAGNOSIS — Z9181 History of falling: Secondary | ICD-10-CM | POA: Diagnosis not present

## 2021-12-12 DIAGNOSIS — M5459 Other low back pain: Secondary | ICD-10-CM | POA: Diagnosis not present

## 2021-12-12 DIAGNOSIS — R2681 Unsteadiness on feet: Secondary | ICD-10-CM | POA: Diagnosis not present

## 2021-12-12 DIAGNOSIS — M5432 Sciatica, left side: Secondary | ICD-10-CM | POA: Diagnosis not present
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
## 2021-12-15 ENCOUNTER — Other Ambulatory Visit (INDEPENDENT_AMBULATORY_CARE_PROVIDER_SITE_OTHER): Payer: Medicare Other

## 2021-12-15 ENCOUNTER — Ambulatory Visit (INDEPENDENT_AMBULATORY_CARE_PROVIDER_SITE_OTHER): Payer: Medicare Other

## 2021-12-15 DIAGNOSIS — E1149 Type 2 diabetes mellitus with other diabetic neurological complication: Secondary | ICD-10-CM

## 2021-12-15 DIAGNOSIS — E119 Type 2 diabetes mellitus without complications: Secondary | ICD-10-CM | POA: Diagnosis not present

## 2021-12-15 DIAGNOSIS — E538 Deficiency of other specified B group vitamins: Secondary | ICD-10-CM | POA: Diagnosis not present

## 2021-12-15 LAB — BASIC METABOLIC PANEL
BUN: 29 mg/dL — ABNORMAL HIGH (ref 6–23)
CO2: 25 mEq/L (ref 19–32)
Calcium: 9.2 mg/dL (ref 8.4–10.5)
Chloride: 105 mEq/L (ref 96–112)
Creatinine, Ser: 1.33 mg/dL (ref 0.40–1.50)
GFR: 52.33 mL/min — ABNORMAL LOW (ref >60.00)
Glucose, Bld: 166 mg/dL — ABNORMAL HIGH (ref 70–99)
Potassium: 4.4 mEq/L (ref 3.5–5.1)
Sodium: 141 mEq/L (ref 135–145)

## 2021-12-15 LAB — HEMOGLOBIN A1C: Hgb A1c MFr Bld: 7.3 % — ABNORMAL HIGH (ref 4.6–6.5)

## 2021-12-15 LAB — VITAMIN B12: Vitamin B-12: 430 pg/mL (ref 211–911)

## 2021-12-15 MED ORDER — CYANOCOBALAMIN 1000 MCG/ML IJ SOLN
1000.0000 ug | Freq: Once | INTRAMUSCULAR | Status: AC
  Administered 2021-12-15: 1000 ug via INTRAMUSCULAR

## 2021-12-15 NOTE — Progress Notes (Signed)
Per orders of Dr. [name], injection of B12 monthly given by Kris Mouton. Patient tolerated injection well.

## 2021-12-19 ENCOUNTER — Ambulatory Visit: Payer: Medicare Other | Admitting: Physical Therapy

## 2021-12-19 ENCOUNTER — Encounter: Payer: Self-pay | Admitting: Physical Therapy

## 2021-12-19 DIAGNOSIS — R2681 Unsteadiness on feet: Secondary | ICD-10-CM | POA: Diagnosis not present

## 2021-12-19 DIAGNOSIS — M5432 Sciatica, left side: Secondary | ICD-10-CM | POA: Diagnosis not present

## 2021-12-19 DIAGNOSIS — Z9181 History of falling: Secondary | ICD-10-CM | POA: Diagnosis not present

## 2021-12-19 DIAGNOSIS — M5459 Other low back pain: Secondary | ICD-10-CM | POA: Diagnosis not present

## 2021-12-19 DIAGNOSIS — R262 Difficulty in walking, not elsewhere classified: Secondary | ICD-10-CM | POA: Diagnosis not present
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
## 2021-12-21 ENCOUNTER — Encounter: Payer: Self-pay | Admitting: Physical Therapy

## 2021-12-21 ENCOUNTER — Ambulatory Visit: Payer: Medicare Other | Admitting: Physical Therapy

## 2021-12-21 DIAGNOSIS — M5459 Other low back pain: Secondary | ICD-10-CM

## 2021-12-21 DIAGNOSIS — R262 Difficulty in walking, not elsewhere classified: Secondary | ICD-10-CM

## 2021-12-21 DIAGNOSIS — R2681 Unsteadiness on feet: Secondary | ICD-10-CM

## 2021-12-21 DIAGNOSIS — M5432 Sciatica, left side: Secondary | ICD-10-CM | POA: Diagnosis not present

## 2021-12-21 DIAGNOSIS — Z9181 History of falling: Secondary | ICD-10-CM | POA: Diagnosis not present
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
## 2021-12-22 DIAGNOSIS — I5030 Unspecified diastolic (congestive) heart failure: Secondary | ICD-10-CM | POA: Diagnosis not present

## 2021-12-25 ENCOUNTER — Encounter: Payer: Self-pay | Admitting: Physical Therapy

## 2021-12-25 ENCOUNTER — Ambulatory Visit: Payer: Medicare Other | Admitting: Physical Therapy

## 2021-12-25 DIAGNOSIS — M5432 Sciatica, left side: Secondary | ICD-10-CM

## 2021-12-25 DIAGNOSIS — R2681 Unsteadiness on feet: Secondary | ICD-10-CM | POA: Diagnosis not present

## 2021-12-25 DIAGNOSIS — Z9181 History of falling: Secondary | ICD-10-CM

## 2021-12-25 DIAGNOSIS — M5459 Other low back pain: Secondary | ICD-10-CM

## 2021-12-25 DIAGNOSIS — R262 Difficulty in walking, not elsewhere classified: Secondary | ICD-10-CM
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
## 2021-12-27 ENCOUNTER — Other Ambulatory Visit: Payer: Self-pay | Admitting: Family Medicine

## 2021-12-27 ENCOUNTER — Ambulatory Visit: Payer: Medicare Other | Admitting: Physical Therapy

## 2021-12-27 ENCOUNTER — Encounter: Payer: Self-pay | Admitting: Physical Therapy

## 2021-12-27 DIAGNOSIS — Z9181 History of falling: Secondary | ICD-10-CM | POA: Diagnosis not present

## 2021-12-27 DIAGNOSIS — M5459 Other low back pain: Secondary | ICD-10-CM

## 2021-12-27 DIAGNOSIS — R2681 Unsteadiness on feet: Secondary | ICD-10-CM

## 2021-12-27 DIAGNOSIS — M5432 Sciatica, left side: Secondary | ICD-10-CM | POA: Diagnosis not present

## 2021-12-27 DIAGNOSIS — R262 Difficulty in walking, not elsewhere classified: Secondary | ICD-10-CM | POA: Diagnosis not present
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
## 2021-12-29 ENCOUNTER — Ambulatory Visit: Payer: Medicare Other | Admitting: Family Medicine

## 2022-01-01 ENCOUNTER — Ambulatory Visit (INDEPENDENT_AMBULATORY_CARE_PROVIDER_SITE_OTHER): Payer: Medicare Other | Admitting: Family Medicine

## 2022-01-01 ENCOUNTER — Encounter: Payer: Self-pay | Admitting: Physical Therapy

## 2022-01-01 ENCOUNTER — Ambulatory Visit: Payer: Medicare Other | Attending: Rheumatology

## 2022-01-01 ENCOUNTER — Telehealth: Payer: Self-pay | Admitting: Family Medicine

## 2022-01-01 ENCOUNTER — Encounter: Payer: Self-pay | Admitting: Family Medicine

## 2022-01-01 ENCOUNTER — Encounter: Payer: Medicare Other | Admitting: Physical Therapy

## 2022-01-01 DIAGNOSIS — M5432 Sciatica, left side: Secondary | ICD-10-CM | POA: Diagnosis not present

## 2022-01-01 DIAGNOSIS — M5459 Other low back pain: Secondary | ICD-10-CM | POA: Diagnosis not present

## 2022-01-01 DIAGNOSIS — E1149 Type 2 diabetes mellitus with other diabetic neurological complication: Secondary | ICD-10-CM | POA: Diagnosis not present

## 2022-01-01 DIAGNOSIS — R2681 Unsteadiness on feet: Secondary | ICD-10-CM | POA: Diagnosis not present

## 2022-01-01 DIAGNOSIS — Z9181 History of falling: Secondary | ICD-10-CM | POA: Insufficient documentation

## 2022-01-01 DIAGNOSIS — H353131 Nonexudative age-related macular degeneration, bilateral, early dry stage: Secondary | ICD-10-CM | POA: Diagnosis not present

## 2022-01-01 DIAGNOSIS — R262 Difficulty in walking, not elsewhere classified: Secondary | ICD-10-CM | POA: Insufficient documentation

## 2022-01-01 MED ORDER — ACCU-CHEK FASTCLIX LANCETS MISC
3 refills | Status: DC
Start: 2022-01-01 — End: 2022-05-01

## 2022-01-01 MED ORDER — GLIPIZIDE 5 MG PO TABS: 5.0000 mg | ORAL_TABLET | Freq: Every day | ORAL | 3 refills | Status: DC

## 2022-01-01 MED ORDER — PREGABALIN 50 MG PO CAPS: ORAL_CAPSULE | ORAL | 1 refills | Status: DC
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
## 2022-01-01 NOTE — Progress Notes (Unsigned)
Diabetes:  Using medications without difficulties: yes Hypoglycemic episodes:no  Hyperglycemic episodes: see below.  Feet problems: burning in the feet.  Blood Sugars averaging: prev 120-140, now 150-200.   eye exam within last year: to be done today.   A1c 6.6-----> up to 7.3 now.    B12 wnl on replacement.    Less effect from lyrica.  Neuropathy sx worse in the meantime.    He is in PT for arthritis and balance.  Still walking with a cane.    Meds, vitals, and allergies reviewed.  ROS: Per HPI unless specifically indicated in ROS section   GEN: nad, alert and oriented HEENT: ncat NECK: supple w/o LA CV: rrr. PULM: ctab, no inc wob ABD: soft, +bs EXT: no edema SKIN: no acute rash

## 2022-01-01 NOTE — Telephone Encounter (Signed)
Rx has been resent to [facility].

## 2022-01-01 NOTE — Telephone Encounter (Signed)
Pt's wife called stated glipiZIDE (GLUCOTROL) 5 MG tablet was sent to the wrong pharmacy, it was suppose to be sent to [facility]. Call back # is [phone]

## 2022-01-01 NOTE — Patient Instructions (Addendum)
Try get cut back on carbs and restart glipizide.  Try an extra lyrica tab in the AM if needed for pain with sedation caution.  Recheck in about 3 months with labs prior to a yearly visit.  Take care.  Glad to see you.

## 2022-01-02 DIAGNOSIS — H353220 Exudative age-related macular degeneration, left eye, stage unspecified: Secondary | ICD-10-CM | POA: Diagnosis not present

## 2022-01-03 NOTE — Assessment & Plan Note (Signed)
Discussed options, with presumption that worsening sugar control has contributed to increased neuropathy symptoms. I asked him to try to cut back on carbs and restart glipizide.  Try an extra lyrica tab in the AM if needed for pain with sedation caution.  Recheck in about 3 months with labs prior to a yearly visit.  He agrees to plan.  Update me as needed in the meantime.

## 2022-01-04 ENCOUNTER — Encounter: Payer: Self-pay | Admitting: Physical Therapy

## 2022-01-04 ENCOUNTER — Ambulatory Visit: Payer: Medicare Other

## 2022-01-04 DIAGNOSIS — Z9181 History of falling: Secondary | ICD-10-CM | POA: Diagnosis not present

## 2022-01-04 DIAGNOSIS — R2681 Unsteadiness on feet: Secondary | ICD-10-CM

## 2022-01-04 DIAGNOSIS — M5459 Other low back pain: Secondary | ICD-10-CM

## 2022-01-04 DIAGNOSIS — R262 Difficulty in walking, not elsewhere classified: Secondary | ICD-10-CM | POA: Diagnosis not present

## 2022-01-04 DIAGNOSIS — M5432 Sciatica, left side: Secondary | ICD-10-CM | POA: Diagnosis not present
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
## 2022-01-09 ENCOUNTER — Encounter: Payer: Self-pay | Admitting: Physical Therapy

## 2022-01-09 ENCOUNTER — Ambulatory Visit: Payer: Medicare Other | Admitting: Physical Therapy

## 2022-01-09 DIAGNOSIS — M5459 Other low back pain: Secondary | ICD-10-CM

## 2022-01-09 DIAGNOSIS — R2681 Unsteadiness on feet: Secondary | ICD-10-CM | POA: Diagnosis not present

## 2022-01-09 DIAGNOSIS — R262 Difficulty in walking, not elsewhere classified: Secondary | ICD-10-CM | POA: Diagnosis not present

## 2022-01-09 DIAGNOSIS — M5432 Sciatica, left side: Secondary | ICD-10-CM | POA: Diagnosis not present

## 2022-01-09 DIAGNOSIS — Z9181 History of falling: Secondary | ICD-10-CM
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
## 2022-01-11 ENCOUNTER — Encounter: Payer: Self-pay | Admitting: Physical Therapy

## 2022-01-11 ENCOUNTER — Ambulatory Visit: Payer: Medicare Other | Admitting: Physical Therapy

## 2022-01-11 DIAGNOSIS — R2681 Unsteadiness on feet: Secondary | ICD-10-CM

## 2022-01-11 DIAGNOSIS — Z9181 History of falling: Secondary | ICD-10-CM | POA: Diagnosis not present

## 2022-01-11 DIAGNOSIS — M5432 Sciatica, left side: Secondary | ICD-10-CM | POA: Diagnosis not present

## 2022-01-11 DIAGNOSIS — M5459 Other low back pain: Secondary | ICD-10-CM

## 2022-01-11 DIAGNOSIS — R262 Difficulty in walking, not elsewhere classified: Secondary | ICD-10-CM | POA: Diagnosis not present
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
## 2022-01-16 ENCOUNTER — Encounter: Payer: Self-pay | Admitting: Physical Therapy

## 2022-01-16 ENCOUNTER — Ambulatory Visit (INDEPENDENT_AMBULATORY_CARE_PROVIDER_SITE_OTHER): Payer: Medicare Other

## 2022-01-16 ENCOUNTER — Ambulatory Visit: Payer: Medicare Other | Admitting: Physical Therapy

## 2022-01-16 DIAGNOSIS — R262 Difficulty in walking, not elsewhere classified: Secondary | ICD-10-CM

## 2022-01-16 DIAGNOSIS — R2681 Unsteadiness on feet: Secondary | ICD-10-CM | POA: Diagnosis not present

## 2022-01-16 DIAGNOSIS — M5459 Other low back pain: Secondary | ICD-10-CM | POA: Diagnosis not present

## 2022-01-16 DIAGNOSIS — Z9181 History of falling: Secondary | ICD-10-CM | POA: Diagnosis not present

## 2022-01-16 DIAGNOSIS — E538 Deficiency of other specified B group vitamins: Secondary | ICD-10-CM | POA: Diagnosis not present

## 2022-01-16 DIAGNOSIS — M5432 Sciatica, left side: Secondary | ICD-10-CM

## 2022-01-16 MED ORDER — CYANOCOBALAMIN 1000 MCG/ML IJ SOLN
1000.0000 ug | Freq: Once | INTRAMUSCULAR | Status: AC
  Administered 2022-01-16: 1000 ug via INTRAMUSCULAR

## 2022-01-16 NOTE — Progress Notes (Signed)
Per orders of Dr. [name], monthly injection of B12, given by Loreen Freud. Patient tolerated injection well.
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
## 2022-01-18 ENCOUNTER — Ambulatory Visit: Payer: Medicare Other | Admitting: Physical Therapy

## 2022-01-18 ENCOUNTER — Encounter: Payer: Self-pay | Admitting: Physical Therapy

## 2022-01-18 DIAGNOSIS — R262 Difficulty in walking, not elsewhere classified: Secondary | ICD-10-CM

## 2022-01-18 DIAGNOSIS — M5459 Other low back pain: Secondary | ICD-10-CM | POA: Diagnosis not present

## 2022-01-18 DIAGNOSIS — Z9181 History of falling: Secondary | ICD-10-CM

## 2022-01-18 DIAGNOSIS — M5432 Sciatica, left side: Secondary | ICD-10-CM

## 2022-01-18 DIAGNOSIS — R2681 Unsteadiness on feet: Secondary | ICD-10-CM
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
## 2022-01-23 ENCOUNTER — Encounter: Payer: Self-pay | Admitting: Physical Therapy

## 2022-01-23 ENCOUNTER — Ambulatory Visit: Payer: Medicare Other | Admitting: Physical Therapy

## 2022-01-23 DIAGNOSIS — R2681 Unsteadiness on feet: Secondary | ICD-10-CM

## 2022-01-23 DIAGNOSIS — M5432 Sciatica, left side: Secondary | ICD-10-CM | POA: Diagnosis not present

## 2022-01-23 DIAGNOSIS — R262 Difficulty in walking, not elsewhere classified: Secondary | ICD-10-CM

## 2022-01-23 DIAGNOSIS — Z9181 History of falling: Secondary | ICD-10-CM | POA: Diagnosis not present

## 2022-01-23 DIAGNOSIS — M5459 Other low back pain: Secondary | ICD-10-CM
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
## 2022-01-24 NOTE — Telephone Encounter (Signed)
Recommend receiving annual flu shot and covid-19 booster.  The RSV vaccine may not be covered by medicare so please advise patient to check with PCP.

## 2022-01-25 ENCOUNTER — Encounter: Payer: Self-pay | Admitting: Physical Therapy

## 2022-01-25 ENCOUNTER — Ambulatory Visit: Payer: Medicare Other | Admitting: Physical Therapy

## 2022-01-25 DIAGNOSIS — M5459 Other low back pain: Secondary | ICD-10-CM | POA: Diagnosis not present

## 2022-01-25 DIAGNOSIS — M5432 Sciatica, left side: Secondary | ICD-10-CM

## 2022-01-25 DIAGNOSIS — R2681 Unsteadiness on feet: Secondary | ICD-10-CM

## 2022-01-25 DIAGNOSIS — Z9181 History of falling: Secondary | ICD-10-CM | POA: Diagnosis not present

## 2022-01-25 DIAGNOSIS — R262 Difficulty in walking, not elsewhere classified: Secondary | ICD-10-CM
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
## 2022-01-29 ENCOUNTER — Other Ambulatory Visit: Payer: Self-pay | Admitting: Physician Assistant

## 2022-01-29 ENCOUNTER — Ambulatory Visit: Payer: Medicare Other | Admitting: Physical Therapy

## 2022-01-29 ENCOUNTER — Encounter: Payer: Self-pay | Admitting: Physical Therapy

## 2022-01-29 ENCOUNTER — Telehealth: Payer: Self-pay | Admitting: Pharmacist

## 2022-01-29 DIAGNOSIS — R2681 Unsteadiness on feet: Secondary | ICD-10-CM

## 2022-01-29 DIAGNOSIS — L4050 Arthropathic psoriasis, unspecified: Secondary | ICD-10-CM

## 2022-01-29 DIAGNOSIS — M5459 Other low back pain: Secondary | ICD-10-CM | POA: Diagnosis not present

## 2022-01-29 DIAGNOSIS — M5432 Sciatica, left side: Secondary | ICD-10-CM | POA: Diagnosis not present

## 2022-01-29 DIAGNOSIS — L409 Psoriasis, unspecified: Secondary | ICD-10-CM

## 2022-01-29 DIAGNOSIS — Z9181 History of falling: Secondary | ICD-10-CM | POA: Diagnosis not present

## 2022-01-29 DIAGNOSIS — R262 Difficulty in walking, not elsewhere classified: Secondary | ICD-10-CM | POA: Diagnosis not present

## 2022-01-29 DIAGNOSIS — Z79899 Other long term (current) drug therapy: Secondary | ICD-10-CM
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
## 2022-01-29 NOTE — Telephone Encounter (Signed)
Next Visit: 04/19/2022  Last Visit: 11/15/2021  Last Fill: 09/29/2021  PR:[id number] arthritis   Current Dose per office note 11/15/2021: Taltz 80 mg sq injections 28 days  Labs: 11/15/2021 RBC count, hgb, and hct remain low and are trending down slowly.   Glucose is elevated-283. Creatinine is elevated-1.44 and GFR is low-51.  TB Gold: 03/06/2021 Neg   Okay to refill Taltz?

## 2022-01-29 NOTE — Telephone Encounter (Signed)
Received fax from [facility] that they are taking 2024 patient assistance renewal application. Patient receives Materials engineer through patient assistance program.   ATC patient to discuss renewal application. Left VM ADVISING that his portion will be mailed to his home today. Patient portion mailed today.   Provider portion placed in Hazel Sams, PA-C's folder to be signed   Submitted a Prior Authorization RENEWAL request to [facility] for TALTZ via CoverMyMeds. PA approval will need to be attached to application when submitted   Key: [id number]   [name], PharmD, MPH, BCPS, CPP Clinical Pharmacist (Rheumatology and Pulmonology)

## 2022-01-30 DIAGNOSIS — H353220 Exudative age-related macular degeneration, left eye, stage unspecified: Secondary | ICD-10-CM | POA: Diagnosis not present

## 2022-01-30 NOTE — Telephone Encounter (Signed)
Signed provider portion of [employer] PAP application received. Submission is pending patient portion.  Per PA renewal response: available without authorization. Will attached PA approval letter from 2023 (expires 04/01/2022)  [name], PharmD, MPH, BCPS, CPP Clinical Pharmacist (Rheumatology and Pulmonology)

## 2022-01-31 ENCOUNTER — Other Ambulatory Visit: Payer: Self-pay | Admitting: Physician Assistant

## 2022-01-31 ENCOUNTER — Ambulatory Visit: Payer: Medicare Other | Admitting: Physical Therapy

## 2022-01-31 DIAGNOSIS — L4050 Arthropathic psoriasis, unspecified: Secondary | ICD-10-CM

## 2022-01-31 DIAGNOSIS — L409 Psoriasis, unspecified: Secondary | ICD-10-CM

## 2022-01-31 DIAGNOSIS — Z79899 Other long term (current) drug therapy: Secondary | ICD-10-CM

## 2022-02-01 ENCOUNTER — Telehealth: Payer: Self-pay | Admitting: Physician Assistant

## 2022-02-01 DIAGNOSIS — L409 Psoriasis, unspecified: Secondary | ICD-10-CM

## 2022-02-01 DIAGNOSIS — L4050 Arthropathic psoriasis, unspecified: Secondary | ICD-10-CM

## 2022-02-01 DIAGNOSIS — Z79899 Other long term (current) drug therapy: Secondary | ICD-10-CM

## 2022-02-01 NOTE — Telephone Encounter (Signed)
Patient advised 90 day supply was sent for the Taltz on 01/29/2022 which should get him through until the end of the year. Patient expressed understanding.

## 2022-02-01 NOTE — Telephone Encounter (Signed)
Patient called called the office requesting a prescription for Taltz be sent to the [facility] for the remainder of the year.

## 2022-02-05 ENCOUNTER — Encounter: Payer: Self-pay | Admitting: Family Medicine

## 2022-02-05 NOTE — Telephone Encounter (Signed)
Refill request [facility] refill 01/01/22   270/1 refill Last office visit 01/01/22  Looks like script was printed.

## 2022-02-06 MED ORDER — PREGABALIN 100 MG PO CAPS: 100.0000 mg | ORAL_CAPSULE | Freq: Two times a day (BID) | ORAL | 1 refills | Status: DC

## 2022-02-07 ENCOUNTER — Ambulatory Visit: Payer: Medicare Other | Attending: Rheumatology | Admitting: Physical Therapy

## 2022-02-07 ENCOUNTER — Encounter: Payer: Self-pay | Admitting: Physical Therapy

## 2022-02-07 DIAGNOSIS — Z9181 History of falling: Secondary | ICD-10-CM

## 2022-02-07 DIAGNOSIS — R262 Difficulty in walking, not elsewhere classified: Secondary | ICD-10-CM | POA: Diagnosis not present

## 2022-02-07 DIAGNOSIS — R2681 Unsteadiness on feet: Secondary | ICD-10-CM

## 2022-02-07 DIAGNOSIS — M5432 Sciatica, left side: Secondary | ICD-10-CM | POA: Diagnosis not present

## 2022-02-07 DIAGNOSIS — M5459 Other low back pain: Secondary | ICD-10-CM
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
## 2022-02-12 MED ORDER — TALTZ 80 MG/ML ~~LOC~~ SOAJ: SUBCUTANEOUS | 0 refills | Status: DC

## 2022-02-12 NOTE — Telephone Encounter (Signed)
Patient states he called [facility] and they advised that they don't have [name] file. Resent today  [name], PharmD, MPH, BCPS, CPP Clinical Pharmacist (Rheumatology and Pulmonology)

## 2022-02-12 NOTE — Addendum Note (Signed)
Addended by: Cassandria Anger on: 02/12/2022 10:12 AM   Modules accepted: Orders

## 2022-02-12 NOTE — Telephone Encounter (Signed)
Patient called stating he has not received medication yet but also states he has not reached back out to [employer]. He will reach out.  [name], PharmD, MPH, BCPS, CPP Clinical Pharmacist (Rheumatology and Pulmonology)

## 2022-02-13 ENCOUNTER — Encounter: Payer: Self-pay | Admitting: Physical Therapy

## 2022-02-13 ENCOUNTER — Ambulatory Visit: Payer: Medicare Other | Admitting: Physical Therapy

## 2022-02-13 DIAGNOSIS — M5432 Sciatica, left side: Secondary | ICD-10-CM | POA: Diagnosis not present

## 2022-02-13 DIAGNOSIS — R262 Difficulty in walking, not elsewhere classified: Secondary | ICD-10-CM | POA: Diagnosis not present

## 2022-02-13 DIAGNOSIS — Z9181 History of falling: Secondary | ICD-10-CM | POA: Diagnosis not present

## 2022-02-13 DIAGNOSIS — M5459 Other low back pain: Secondary | ICD-10-CM

## 2022-02-13 DIAGNOSIS — R2681 Unsteadiness on feet: Secondary | ICD-10-CM
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
## 2022-02-20 NOTE — Telephone Encounter (Signed)
Submitted Patient Assistance RENEWAL Application to [facility] for [facility] along with provider portion, patient portion, med list, insurance card copy, and PA. Will update patient when we receive a response.  Fax# [phone] Phone# [phone]  [name], PharmD, MPH, BCPS, CPP Clinical Pharmacist (Rheumatology and Pulmonology)

## 2022-02-21 NOTE — Telephone Encounter (Signed)
Received a fax from  [facility] regarding a re-approval for [place] patient assistance from 04/02/22 to 04/02/2023.   Phone number: [phone]  [name], PharmD, MPH, BCPS, CPP Clinical Pharmacist (Rheumatology and Pulmonology)

## 2022-02-26 ENCOUNTER — Encounter: Payer: Self-pay | Admitting: Physical Therapy

## 2022-02-26 ENCOUNTER — Ambulatory Visit: Payer: Medicare Other | Admitting: Physical Therapy

## 2022-02-26 DIAGNOSIS — R2681 Unsteadiness on feet: Secondary | ICD-10-CM | POA: Diagnosis not present

## 2022-02-26 DIAGNOSIS — M5432 Sciatica, left side: Secondary | ICD-10-CM | POA: Diagnosis not present

## 2022-02-26 DIAGNOSIS — Z9181 History of falling: Secondary | ICD-10-CM

## 2022-02-26 DIAGNOSIS — R262 Difficulty in walking, not elsewhere classified: Secondary | ICD-10-CM | POA: Diagnosis not present

## 2022-02-26 DIAGNOSIS — M5459 Other low back pain: Secondary | ICD-10-CM
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
## 2022-02-28 ENCOUNTER — Encounter: Payer: Self-pay | Admitting: Physical Therapy

## 2022-02-28 ENCOUNTER — Ambulatory Visit: Payer: Medicare Other | Admitting: Physical Therapy

## 2022-02-28 DIAGNOSIS — M5432 Sciatica, left side: Secondary | ICD-10-CM | POA: Diagnosis not present

## 2022-02-28 DIAGNOSIS — M5459 Other low back pain: Secondary | ICD-10-CM

## 2022-02-28 DIAGNOSIS — R2681 Unsteadiness on feet: Secondary | ICD-10-CM | POA: Diagnosis not present

## 2022-02-28 DIAGNOSIS — R262 Difficulty in walking, not elsewhere classified: Secondary | ICD-10-CM

## 2022-02-28 DIAGNOSIS — Z9181 History of falling: Secondary | ICD-10-CM | POA: Diagnosis not present
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
## 2022-03-01 ENCOUNTER — Other Ambulatory Visit: Payer: Self-pay | Admitting: Psychiatry

## 2022-03-01 DIAGNOSIS — F331 Major depressive disorder, recurrent, moderate: Secondary | ICD-10-CM

## 2022-03-01 DIAGNOSIS — F4310 Post-traumatic stress disorder, unspecified: Secondary | ICD-10-CM

## 2022-03-01 NOTE — Telephone Encounter (Signed)
Please schedule appt

## 2022-03-05 NOTE — Telephone Encounter (Signed)
LVM to call and schedule appt

## 2022-03-07 NOTE — Telephone Encounter (Signed)
Pt has f/u on 05/16/22. Requesting refill on Prozac called to:  [facility] [id number] - [address], [place]   Phone: [phone]  Fax: [phone]

## 2022-03-12 DIAGNOSIS — H353220 Exudative age-related macular degeneration, left eye, stage unspecified: Secondary | ICD-10-CM | POA: Diagnosis not present

## 2022-03-14 ENCOUNTER — Ambulatory Visit (INDEPENDENT_AMBULATORY_CARE_PROVIDER_SITE_OTHER): Payer: Medicare Other

## 2022-03-14 DIAGNOSIS — E538 Deficiency of other specified B group vitamins: Secondary | ICD-10-CM | POA: Diagnosis not present

## 2022-03-14 MED ORDER — CYANOCOBALAMIN 1000 MCG/ML IJ SOLN
1000.0000 ug | Freq: Once | INTRAMUSCULAR | Status: AC
  Administered 2022-03-14: 1000 ug via INTRAMUSCULAR

## 2022-03-14 NOTE — Progress Notes (Signed)
Per orders of Dr. Ria Bush, injection of Vitamin B 12 given by Ozzie Hoyle. Patient tolerated injection well.

## 2022-03-26 ENCOUNTER — Other Ambulatory Visit: Payer: Self-pay | Admitting: Family Medicine

## 2022-04-01 ENCOUNTER — Other Ambulatory Visit: Payer: Self-pay | Admitting: Family Medicine

## 2022-04-01 DIAGNOSIS — E1149 Type 2 diabetes mellitus with other diabetic neurological complication: Secondary | ICD-10-CM

## 2022-04-01 DIAGNOSIS — D638 Anemia in other chronic diseases classified elsewhere: Secondary | ICD-10-CM

## 2022-04-01 DIAGNOSIS — E538 Deficiency of other specified B group vitamins: Secondary | ICD-10-CM

## 2022-04-01 DIAGNOSIS — Z125 Encounter for screening for malignant neoplasm of prostate: Secondary | ICD-10-CM

## 2022-04-01 DIAGNOSIS — E559 Vitamin D deficiency, unspecified: Secondary | ICD-10-CM

## 2022-04-05 ENCOUNTER — Other Ambulatory Visit: Payer: Medicare Other

## 2022-04-05 ENCOUNTER — Other Ambulatory Visit (INDEPENDENT_AMBULATORY_CARE_PROVIDER_SITE_OTHER): Payer: Medicare Other

## 2022-04-05 DIAGNOSIS — D638 Anemia in other chronic diseases classified elsewhere: Secondary | ICD-10-CM

## 2022-04-05 DIAGNOSIS — E538 Deficiency of other specified B group vitamins: Secondary | ICD-10-CM | POA: Diagnosis not present

## 2022-04-05 DIAGNOSIS — E1149 Type 2 diabetes mellitus with other diabetic neurological complication: Secondary | ICD-10-CM | POA: Diagnosis not present

## 2022-04-05 DIAGNOSIS — E559 Vitamin D deficiency, unspecified: Secondary | ICD-10-CM | POA: Diagnosis not present

## 2022-04-05 DIAGNOSIS — Z125 Encounter for screening for malignant neoplasm of prostate: Secondary | ICD-10-CM | POA: Diagnosis not present

## 2022-04-05 LAB — COMPREHENSIVE METABOLIC PANEL
ALT: 9 U/L (ref 0–53)
AST: 10 U/L (ref 0–37)
Albumin: 3.8 g/dL (ref 3.5–5.2)
Alkaline Phosphatase: 116 U/L (ref 39–117)
BUN: 25 mg/dL — ABNORMAL HIGH (ref 6–23)
CO2: 23 mEq/L (ref 19–32)
Calcium: 8.9 mg/dL (ref 8.4–10.5)
Chloride: 106 mEq/L (ref 96–112)
Creatinine, Ser: 1.32 mg/dL (ref 0.40–1.50)
GFR: 52.69 mL/min — ABNORMAL LOW (ref >60.00)
Glucose, Bld: 159 mg/dL — ABNORMAL HIGH (ref 70–99)
Potassium: 4.2 mEq/L (ref 3.5–5.1)
Sodium: 139 mEq/L (ref 135–145)
Total Bilirubin: 0.6 mg/dL (ref 0.2–1.2)
Total Protein: 6.9 g/dL (ref 6.0–8.3)

## 2022-04-05 LAB — VITAMIN B12: Vitamin B-12: 305 pg/mL (ref 211–911)

## 2022-04-05 LAB — CBC WITH DIFFERENTIAL/PLATELET
Basophils Absolute: 0 10*3/uL (ref 0.0–0.1)
Basophils Relative: 0.1 % (ref 0.0–3.0)
Eosinophils Absolute: 0.4 10*3/uL (ref 0.0–0.7)
Eosinophils Relative: 5.5 % — ABNORMAL HIGH (ref 0.0–5.0)
HCT: 31.7 % — ABNORMAL LOW (ref 39.0–52.0)
Hemoglobin: 10.7 g/dL — ABNORMAL LOW (ref 13.0–17.0)
Lymphocytes Relative: 17.7 % (ref 12.0–46.0)
Lymphs Abs: 1.3 10*3/uL (ref 0.7–4.0)
MCHC: 33.7 g/dL (ref 30.0–36.0)
MCV: 92.5 fl (ref 78.0–100.0)
Monocytes Absolute: 0.5 10*3/uL (ref 0.1–1.0)
Monocytes Relative: 6.1 % (ref 3.0–12.0)
Neutro Abs: 5.2 10*3/uL (ref 1.4–7.7)
Neutrophils Relative %: 70.6 % (ref 43.0–77.0)
Platelets: 202 10*3/uL (ref 150.0–400.0)
RBC: 3.42 Mil/uL — ABNORMAL LOW (ref 4.22–5.81)
RDW: 15.1 % (ref 11.5–15.5)
WBC: 7.4 10*3/uL (ref 4.0–10.5)

## 2022-04-05 LAB — VITAMIN D 25 HYDROXY (VIT D DEFICIENCY, FRACTURES): VITD: 53.65 ng/mL (ref 30.00–100.00)

## 2022-04-05 LAB — PSA, MEDICARE: PSA: 0.96 ng/ml (ref 0.10–4.00)

## 2022-04-05 LAB — LIPID PANEL
Cholesterol: 111 mg/dL (ref 0–200)
HDL: 31.4 mg/dL — ABNORMAL LOW (ref >39.00)
LDL Cholesterol: 50 mg/dL (ref 0–99)
NonHDL: 79.35
Total CHOL/HDL Ratio: 4
Triglycerides: 148 mg/dL (ref 0.0–149.0)
VLDL: 29.6 mg/dL (ref 0.0–40.0)

## 2022-04-05 LAB — IRON: Iron: 63 ug/dL (ref 42–165)

## 2022-04-05 LAB — MICROALBUMIN / CREATININE URINE RATIO
Creatinine,U: 61.4 mg/dL
Microalb Creat Ratio: 4 mg/g (ref 0.0–30.0)
Microalb, Ur: 2.5 mg/dL — ABNORMAL HIGH (ref 0.0–1.9)

## 2022-04-05 LAB — HEMOGLOBIN A1C: Hgb A1c MFr Bld: 6 % (ref 4.6–6.5)

## 2022-04-05 LAB — TSH: TSH: 3.41 u[IU]/mL (ref 0.35–5.50)

## 2022-04-06 DIAGNOSIS — I5030 Unspecified diastolic (congestive) heart failure: Secondary | ICD-10-CM | POA: Diagnosis not present
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
## 2022-04-12 ENCOUNTER — Encounter: Payer: Self-pay | Admitting: Family Medicine

## 2022-04-12 ENCOUNTER — Ambulatory Visit (INDEPENDENT_AMBULATORY_CARE_PROVIDER_SITE_OTHER): Payer: Medicare Other | Admitting: Family Medicine

## 2022-04-12 VITALS — BP 124/80 | HR 88 | Temp 97.2°F | Ht 69.5 in | Wt 276.0 lb

## 2022-04-12 DIAGNOSIS — D638 Anemia in other chronic diseases classified elsewhere: Secondary | ICD-10-CM

## 2022-04-12 DIAGNOSIS — E1149 Type 2 diabetes mellitus with other diabetic neurological complication: Secondary | ICD-10-CM

## 2022-04-12 DIAGNOSIS — Z23 Encounter for immunization: Secondary | ICD-10-CM

## 2022-04-12 DIAGNOSIS — L4050 Arthropathic psoriasis, unspecified: Secondary | ICD-10-CM

## 2022-04-12 DIAGNOSIS — D649 Anemia, unspecified: Secondary | ICD-10-CM | POA: Diagnosis not present

## 2022-04-12 NOTE — Patient Instructions (Addendum)
Ask the front about a record release from [facility].   I think it makes sense to get the cologuard done and see hematology.  Let me know if the cologuard order is out of date or if you don't get a call about seeing hematology.    Warm compresses and return tomorrow at 3PM.  Hold elquis in the meantime.

## 2022-04-12 NOTE — Progress Notes (Signed)
Diabetes:  Using medications without difficulties: yes Hypoglycemic episodes: only if prolonged fasting.  Hyperglycemic episodes:no Feet problems: no Blood Sugars averaging: usually <130.   eye exam within last year: yes, [place].  On monthly injections.  MALB positive. A1c at goal and already on ACE.  Labs d/w pt.    Mildly low stable HGB.  He had small hemorrhoid that bled at [facility].  On eliquis.  Fatigued.    Seeing rheumatology next week about taltz.  He had a 2 week delay in med use and then had more pain and psoriasis in the meantime.  B wrist and finger pain.    Labs d/w pt.    Taking eliquis.  Sebaceous cyst on the back of the neck.  Recently enlarging.  Discussed with patient about holding Eliquis x 2 doses then perform incision and drainage tomorrow.  PMH and SH reviewed  Meds, vitals, and allergies reviewed.   ROS: Per HPI unless specifically indicated in ROS section   GEN: nad, alert and oriented HEENT: ncat NECK: supple w/o LA, 3 cm infected sebaceous cyst on the posterior neck, right side. CV: IRR PULM: ctab, no inc wob ABD: soft, +bs EXT: no edema SKIN: no acute rash  30 minutes were devoted to patient care in this encounter (this includes time spent reviewing the patient's file/history, interviewing and examining the patient, counseling/reviewing plan with patient).

## 2022-04-13 ENCOUNTER — Ambulatory Visit (INDEPENDENT_AMBULATORY_CARE_PROVIDER_SITE_OTHER): Payer: Medicare Other | Admitting: Family Medicine

## 2022-04-13 ENCOUNTER — Encounter: Payer: Self-pay | Admitting: Family Medicine

## 2022-04-13 VITALS — BP 122/76 | HR 96 | Temp 97.5°F | Ht 69.5 in | Wt 276.0 lb

## 2022-04-13 DIAGNOSIS — L089 Local infection of the skin and subcutaneous tissue, unspecified: Secondary | ICD-10-CM | POA: Diagnosis not present

## 2022-04-13 DIAGNOSIS — L723 Sebaceous cyst: Secondary | ICD-10-CM | POA: Diagnosis not present

## 2022-04-13 NOTE — Patient Instructions (Signed)
Keep it clean and covered.  Pull the packing Sunday AM if it doesn't come out in the meantime.  Then wash with soapy water.  Restart eliquis.  Update [place] as needed.  Take care.  Glad to see you.

## 2022-04-13 NOTE — Progress Notes (Unsigned)
I&D  Meds, vitals, and allergies reviewed.   Indication: suspect abscess  Pt complaints of: erythema, pain, swelling  Location:  Size:  Informed consent obtained.  Pt aware of risks not limited to but including infection, bleeding, damage to near by organs.  Prep: etoh/betadine  Anesthesia: 1%lidocaine with epi, good effect  Incision made with #11 blade  Wound explored and loculations removed  Wound packed with iodoform gauze  Tolerated well  Routine postprocedure instructions d/w pt- remove packing in ~36h, keep area clean and bandaged, follow up if concerns/spreading erythema/pain.

## 2022-04-15 DIAGNOSIS — L723 Sebaceous cyst: Secondary | ICD-10-CM | POA: Insufficient documentation

## 2022-04-15 DIAGNOSIS — L089 Local infection of the skin and subcutaneous tissue, unspecified: Secondary | ICD-10-CM | POA: Insufficient documentation

## 2022-04-15 NOTE — Assessment & Plan Note (Signed)
Discussed seeing hematology.  I think that makes sense in the meantime.  Referral placed.

## 2022-04-15 NOTE — Assessment & Plan Note (Signed)
MALB positive. A1c at goal and already on ACE.  I would prefer not to add any extra medications at this point, patient agrees.  Labs d/w pt.   We need to address his sebaceous cyst first along with his anemia.  Recheck periodically.  Continue glipizide

## 2022-04-15 NOTE — Assessment & Plan Note (Signed)
He has had more pain in the meantime.  More pain at the wrist and fingers.  He is going to follow-up with rheumatology.

## 2022-04-15 NOTE — Assessment & Plan Note (Addendum)
Successful incision and drainage.  No need for antibiotics. Routine postprocedure instructions d/w pt- remove packing in ~36h, keep area clean and bandaged, follow up if concerns/spreading erythema/pain.  He agrees with plan.  Can restart Eliquis tonight.  He held it for 2 doses prior to procedure.

## 2022-04-17 ENCOUNTER — Ambulatory Visit (INDEPENDENT_AMBULATORY_CARE_PROVIDER_SITE_OTHER): Payer: Medicare Other

## 2022-04-17 DIAGNOSIS — E538 Deficiency of other specified B group vitamins: Secondary | ICD-10-CM | POA: Diagnosis not present

## 2022-04-17 MED ORDER — CYANOCOBALAMIN 1000 MCG/ML IJ SOLN
1000.0000 ug | Freq: Once | INTRAMUSCULAR | Status: AC
  Administered 2022-04-17: 1000 ug via INTRAMUSCULAR

## 2022-04-17 NOTE — Progress Notes (Signed)
Per orders of Dr. [name], injection of B-12 given by Barkley Bruns in left deltoid. Patient tolerated injection well.

## 2022-04-19 ENCOUNTER — Encounter: Payer: Self-pay | Admitting: Physician Assistant

## 2022-04-19 ENCOUNTER — Ambulatory Visit: Payer: Medicare Other | Attending: Physician Assistant | Admitting: Physician Assistant

## 2022-04-19 VITALS — BP 113/61 | HR 51 | Resp 17 | Ht 69.5 in | Wt 280.2 lb

## 2022-04-19 DIAGNOSIS — Z9181 History of falling: Secondary | ICD-10-CM | POA: Insufficient documentation

## 2022-04-19 DIAGNOSIS — M19071 Primary osteoarthritis, right ankle and foot: Secondary | ICD-10-CM | POA: Diagnosis not present

## 2022-04-19 DIAGNOSIS — I472 Ventricular tachycardia, unspecified: Secondary | ICD-10-CM | POA: Diagnosis not present

## 2022-04-19 DIAGNOSIS — M19041 Primary osteoarthritis, right hand: Secondary | ICD-10-CM | POA: Diagnosis not present

## 2022-04-19 DIAGNOSIS — Z96653 Presence of artificial knee joint, bilateral: Secondary | ICD-10-CM | POA: Diagnosis not present

## 2022-04-19 DIAGNOSIS — G2581 Restless legs syndrome: Secondary | ICD-10-CM | POA: Insufficient documentation

## 2022-04-19 DIAGNOSIS — M722 Plantar fascial fibromatosis: Secondary | ICD-10-CM | POA: Insufficient documentation

## 2022-04-19 DIAGNOSIS — Z87448 Personal history of other diseases of urinary system: Secondary | ICD-10-CM | POA: Diagnosis not present

## 2022-04-19 DIAGNOSIS — M5136 Other intervertebral disc degeneration, lumbar region: Secondary | ICD-10-CM | POA: Diagnosis not present

## 2022-04-19 DIAGNOSIS — Z8639 Personal history of other endocrine, nutritional and metabolic disease: Secondary | ICD-10-CM | POA: Diagnosis not present

## 2022-04-19 DIAGNOSIS — L4050 Arthropathic psoriasis, unspecified: Secondary | ICD-10-CM | POA: Diagnosis not present

## 2022-04-19 DIAGNOSIS — Z111 Encounter for screening for respiratory tuberculosis: Secondary | ICD-10-CM | POA: Diagnosis not present

## 2022-04-19 DIAGNOSIS — Z7901 Long term (current) use of anticoagulants: Secondary | ICD-10-CM | POA: Insufficient documentation

## 2022-04-19 DIAGNOSIS — Z8679 Personal history of other diseases of the circulatory system: Secondary | ICD-10-CM | POA: Diagnosis not present

## 2022-04-19 DIAGNOSIS — M19072 Primary osteoarthritis, left ankle and foot: Secondary | ICD-10-CM | POA: Insufficient documentation

## 2022-04-19 DIAGNOSIS — Z79899 Other long term (current) drug therapy: Secondary | ICD-10-CM | POA: Diagnosis not present

## 2022-04-19 DIAGNOSIS — M7662 Achilles tendinitis, left leg: Secondary | ICD-10-CM | POA: Diagnosis not present

## 2022-04-19 DIAGNOSIS — M19042 Primary osteoarthritis, left hand: Secondary | ICD-10-CM | POA: Insufficient documentation

## 2022-04-19 DIAGNOSIS — L409 Psoriasis, unspecified: Secondary | ICD-10-CM | POA: Diagnosis not present

## 2022-04-19 DIAGNOSIS — Z8669 Personal history of other diseases of the nervous system and sense organs: Secondary | ICD-10-CM | POA: Diagnosis not present

## 2022-04-19 MED ORDER — CLOBETASOL PROPIONATE 0.05 % EX CREA: 1.0000 | TOPICAL_CREAM | Freq: Two times a day (BID) | CUTANEOUS | 0 refills | Status: DC

## 2022-04-19 NOTE — Patient Instructions (Signed)
Standing Labs We placed an order today for your standing lab work.   Please have your standing labs drawn in April and every 3 months   Please have your labs drawn 2 weeks prior to your appointment so that the provider can discuss your lab results at your appointment.  Please note that you may see your imaging and lab results in MyChart before we have reviewed them. We will contact you once all results are reviewed. Please allow our office up to 72 hours to thoroughly review all of the results before contacting the office for clarification of your results.  Lab hours are:   Monday through Thursday from 8:00 am -12:30 pm and 1:00 pm-5:00 pm and Friday from 8:00 am-12:00 pm.  Please be advised, all patients with office appointments requiring lab work will take precedent over walk-in lab work.   Labs are drawn by [facility]. Please bring your co-pay at the time of your lab draw.  You may receive a bill from [facility] for your lab work.  Please note if you are on Hydroxychloroquine and and an order has been placed for a Hydroxychloroquine level, you will need to have it drawn 4 hours or more after your last dose.  If you wish to have your labs drawn at another location, please call the office 24 hours in advance so we can fax the orders.  The office is located at [address], [address], [place], [place] [address] No appointment is necessary.    If you have any questions regarding directions or hours of operation,  please call [phone].   As a reminder, please drink plenty of water prior to coming for your lab work. Thanks!  

## 2022-04-20 LAB — QUANTIFERON-TB GOLD PLUS
Mitogen-NIL: >10 IU/mL
NIL: 0.03 IU/mL
QuantiFERON-TB Gold Plus: NEGATIVE
TB1-NIL: 0 IU/mL
TB2-NIL: <0 IU/mL

## 2022-04-22 NOTE — Progress Notes (Signed)
TB gold negative

## 2022-04-24 ENCOUNTER — Telehealth (HOSPITAL_COMMUNITY): Payer: Self-pay

## 2022-04-24 DIAGNOSIS — H353220 Exudative age-related macular degeneration, left eye, stage unspecified: Secondary | ICD-10-CM | POA: Diagnosis not present

## 2022-04-25 ENCOUNTER — Inpatient Hospital Stay: Payer: Medicare Other

## 2022-04-25 ENCOUNTER — Inpatient Hospital Stay: Payer: Medicare Other | Attending: Hematology and Oncology | Admitting: Hematology and Oncology

## 2022-04-25 ENCOUNTER — Encounter: Payer: Self-pay | Admitting: Hematology and Oncology

## 2022-04-25 VITALS — BP 150/73 | HR 78 | Temp 97.7°F | Resp 18 | Ht 69.0 in | Wt 281.6 lb

## 2022-04-25 DIAGNOSIS — Z79899 Other long term (current) drug therapy: Secondary | ICD-10-CM | POA: Diagnosis not present

## 2022-04-25 DIAGNOSIS — Z7984 Long term (current) use of oral hypoglycemic drugs: Secondary | ICD-10-CM | POA: Insufficient documentation

## 2022-04-25 DIAGNOSIS — D649 Anemia, unspecified: Secondary | ICD-10-CM

## 2022-04-25 DIAGNOSIS — E119 Type 2 diabetes mellitus without complications: Secondary | ICD-10-CM | POA: Insufficient documentation

## 2022-04-25 DIAGNOSIS — I2510 Atherosclerotic heart disease of native coronary artery without angina pectoris: Secondary | ICD-10-CM | POA: Insufficient documentation

## 2022-04-25 DIAGNOSIS — I110 Hypertensive heart disease with heart failure: Secondary | ICD-10-CM | POA: Insufficient documentation

## 2022-04-25 DIAGNOSIS — Z87891 Personal history of nicotine dependence: Secondary | ICD-10-CM | POA: Insufficient documentation

## 2022-04-25 DIAGNOSIS — Z7901 Long term (current) use of anticoagulants: Secondary | ICD-10-CM | POA: Diagnosis not present

## 2022-04-25 DIAGNOSIS — I5032 Chronic diastolic (congestive) heart failure: Secondary | ICD-10-CM | POA: Diagnosis not present

## 2022-04-25 DIAGNOSIS — Z8673 Personal history of transient ischemic attack (TIA), and cerebral infarction without residual deficits: Secondary | ICD-10-CM | POA: Diagnosis not present

## 2022-04-25 DIAGNOSIS — E785 Hyperlipidemia, unspecified: Secondary | ICD-10-CM | POA: Diagnosis not present

## 2022-04-25 LAB — CBC WITH DIFFERENTIAL/PLATELET
Abs Immature Granulocytes: 0.03 10*3/uL (ref 0.00–0.07)
Basophils Absolute: 0.1 10*3/uL (ref 0.0–0.1)
Basophils Relative: 1 %
Eosinophils Absolute: 0.4 10*3/uL (ref 0.0–0.5)
Eosinophils Relative: 5 %
HCT: 30.6 % — ABNORMAL LOW (ref 39.0–52.0)
Hemoglobin: 10.3 g/dL — ABNORMAL LOW (ref 13.0–17.0)
Immature Granulocytes: 0 %
Lymphocytes Relative: 16 %
Lymphs Abs: 1.2 10*3/uL (ref 0.7–4.0)
MCH: 31.1 pg (ref 26.0–34.0)
MCHC: 33.7 g/dL (ref 30.0–36.0)
MCV: 92.4 fL (ref 80.0–100.0)
Monocytes Absolute: 0.5 10*3/uL (ref 0.1–1.0)
Monocytes Relative: 6 %
Neutro Abs: 5.2 10*3/uL (ref 1.7–7.7)
Neutrophils Relative %: 72 %
Platelets: 186 10*3/uL (ref 150–400)
RBC: 3.31 MIL/uL — ABNORMAL LOW (ref 4.22–5.81)
RDW: 14.2 % (ref 11.5–15.5)
WBC: 7.3 10*3/uL (ref 4.0–10.5)
nRBC: 0 % (ref 0.0–0.2)
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
## 2022-04-26 LAB — FERRITIN: Ferritin: 47 ng/mL (ref 24–336)

## 2022-04-27 LAB — FOLATE RBC
Folate, Hemolysate: 427 ng/mL
Folate, RBC: 1377 ng/mL (ref >498)
Hematocrit: 31 % — ABNORMAL LOW (ref 37.5–51.0)

## 2022-05-01 ENCOUNTER — Encounter: Payer: Self-pay | Admitting: Family Medicine

## 2022-05-01 MED ORDER — ACCU-CHEK GUIDE VI STRP: ORAL_STRIP | 3 refills | Status: DC

## 2022-05-01 MED ORDER — ACCU-CHEK FASTCLIX LANCETS MISC: 3 refills | Status: AC

## 2022-05-07 ENCOUNTER — Other Ambulatory Visit: Payer: Self-pay | Admitting: Psychiatry

## 2022-05-07 DIAGNOSIS — F331 Major depressive disorder, recurrent, moderate: Secondary | ICD-10-CM

## 2022-05-07 DIAGNOSIS — F4310 Post-traumatic stress disorder, unspecified: Secondary | ICD-10-CM

## 2022-05-16 ENCOUNTER — Ambulatory Visit: Payer: Medicare Other | Admitting: Psychiatry

## 2022-05-22 ENCOUNTER — Ambulatory Visit (INDEPENDENT_AMBULATORY_CARE_PROVIDER_SITE_OTHER): Payer: Medicare Other

## 2022-05-22 DIAGNOSIS — E538 Deficiency of other specified B group vitamins: Secondary | ICD-10-CM | POA: Diagnosis not present

## 2022-05-22 MED ORDER — CYANOCOBALAMIN 1000 MCG/ML IJ SOLN
1000.0000 ug | Freq: Once | INTRAMUSCULAR | Status: AC
  Administered 2022-05-22: 1000 ug via INTRAMUSCULAR

## 2022-05-22 NOTE — Progress Notes (Signed)
Per orders of Dr. Ria Bush, injection of B-12 given by Barkley Bruns in right deltoid. Patient tolerated injection well.

## 2022-05-27 ENCOUNTER — Other Ambulatory Visit: Payer: Self-pay | Admitting: Family Medicine

## 2022-05-29 DIAGNOSIS — H353221 Exudative age-related macular degeneration, left eye, with active choroidal neovascularization: Secondary | ICD-10-CM | POA: Diagnosis not present

## 2022-06-11 ENCOUNTER — Encounter: Payer: Self-pay | Admitting: Family Medicine

## 2022-06-11 MED ORDER — TAMSULOSIN HCL 0.4 MG PO CAPS: 0.4000 mg | ORAL_CAPSULE | Freq: Every day | ORAL | 3 refills | Status: DC

## 2022-06-11 MED ORDER — ROPINIROLE HCL 1 MG PO TABS: 1.0000 mg | ORAL_TABLET | Freq: Every day | ORAL | 3 refills | Status: DC

## 2022-06-11 MED ORDER — GLIPIZIDE 5 MG PO TABS: 5.0000 mg | ORAL_TABLET | Freq: Every day | ORAL | 3 refills | Status: DC

## 2022-06-21 ENCOUNTER — Ambulatory Visit (INDEPENDENT_AMBULATORY_CARE_PROVIDER_SITE_OTHER): Payer: Medicare Other

## 2022-06-21 DIAGNOSIS — E538 Deficiency of other specified B group vitamins: Secondary | ICD-10-CM

## 2022-06-21 MED ORDER — CYANOCOBALAMIN 1000 MCG/ML IJ SOLN
1000.0000 ug | Freq: Once | INTRAMUSCULAR | Status: AC
  Administered 2022-06-21: 1000 ug via INTRAMUSCULAR

## 2022-06-21 NOTE — Progress Notes (Signed)
Per orders of Dr. [name], injection of B12 given by Vaughan Browner in left deltoid. Patient tolerated injection well. Patient will make appointment for 1 month.

## 2022-06-22 ENCOUNTER — Other Ambulatory Visit: Payer: Self-pay | Admitting: Family Medicine

## 2022-06-27 ENCOUNTER — Ambulatory Visit: Payer: Medicare Other | Admitting: Psychiatry

## 2022-06-28 NOTE — Progress Notes (Signed)
No show

## 2022-07-02 ENCOUNTER — Other Ambulatory Visit: Payer: Self-pay | Admitting: Physician Assistant

## 2022-07-02 NOTE — Telephone Encounter (Signed)
Last Fill: 04/19/2022  Next Visit: 09/18/2022  Last Visit: 04/19/2022  Dx: Psoriasis   Current Dose per office note on 04/19/2022: not discussed  Okay to refill Clobetasol Cream?

## 2022-07-03 ENCOUNTER — Telehealth: Payer: Self-pay | Admitting: Family Medicine

## 2022-07-03 NOTE — Telephone Encounter (Signed)
Called patient to schedule Medicare Annual Wellness Visit (AWV). Left message for patient to call back and schedule Medicare Annual Wellness Visit (AWV).  Last date of AWV: 06/30/2021  Please schedule an appointment at any time with [facility].  If any questions, please contact me at [phone].  Thank you ,  Bernice Cicero Care Guide [facility] AWV TEAM Direct Dial: [phone]    

## 2022-07-14 ENCOUNTER — Other Ambulatory Visit: Payer: Self-pay | Admitting: Physician Assistant

## 2022-07-14 DIAGNOSIS — Z79899 Other long term (current) drug therapy: Secondary | ICD-10-CM

## 2022-07-14 DIAGNOSIS — L4050 Arthropathic psoriasis, unspecified: Secondary | ICD-10-CM

## 2022-07-14 DIAGNOSIS — L409 Psoriasis, unspecified: Secondary | ICD-10-CM

## 2022-07-16 ENCOUNTER — Other Ambulatory Visit: Payer: Self-pay | Admitting: *Deleted

## 2022-07-16 ENCOUNTER — Ambulatory Visit (INDEPENDENT_AMBULATORY_CARE_PROVIDER_SITE_OTHER): Payer: Medicare Other | Admitting: Psychiatry

## 2022-07-16 ENCOUNTER — Encounter: Payer: Self-pay | Admitting: Psychiatry

## 2022-07-16 DIAGNOSIS — Z79899 Other long term (current) drug therapy: Secondary | ICD-10-CM | POA: Diagnosis not present

## 2022-07-16 DIAGNOSIS — F3342 Major depressive disorder, recurrent, in full remission: Secondary | ICD-10-CM | POA: Diagnosis not present

## 2022-07-16 DIAGNOSIS — F331 Major depressive disorder, recurrent, moderate: Secondary | ICD-10-CM | POA: Diagnosis not present

## 2022-07-16 DIAGNOSIS — F4310 Post-traumatic stress disorder, unspecified: Secondary | ICD-10-CM

## 2022-07-16 MED ORDER — FLUOXETINE HCL 20 MG PO CAPS: 40.0000 mg | ORAL_CAPSULE | Freq: Every day | ORAL | 3 refills | Status: DC

## 2022-07-16 NOTE — Telephone Encounter (Signed)
Last Fill: 02/12/2022  Labs: 04/05/2022 Glucose 159, BUN 25, GFR 52.69, RBC 3.42, Hgb 3.42, Hgb 10.7, Hct 31.7  TB Gold: 04/19/2022 Neg    Next Visit: 09/18/2022  Last Visit: 04/19/2022  PX:TGGYIRSWN arthritis   Current Dose per office note 04/19/2022: Taltz 80 mg sq injections 28 days   Patient advised he is due to update labs and states he will update them today.   Okay to refill Taltz?

## 2022-07-16 NOTE — Progress Notes (Signed)
[name] [id number] [birth year] 77 y.o.    Subjective:   Patient ID:  [name] is a 77 y.o. (DOB 14-Aug-1945) male.  Chief Complaint:  Chief Complaint  Patient presents with   Follow-up   Depression    Depression        Associated symptoms include no decreased concentration and no suicidal ideas.  [name] presents to the office today for follow-up of depression and anxiety.  He has been under my care for many years, 70.  On Prozac all those years.    Last seen Dec 2020 without med changes and the folllowing noted: Served custody papers with F trying to get GD.  Left to live with her F in July.  Depressing time for me but handled it.  Had a supportive friends which helped.  Read [name]'s book which helped.  Ok with it.  She's 76 yo.  Still loves them and sees them.  03/22/20 appt with following noted: Last half of the year not good.  Early June lost dear friend unexpectedly.  Late 02/02/2023 only brother died.  Noticed more depression after brother's death.   Wonders about increased fluoxetine from 30 without SE.  Tended to lay in bed longer than he should.  Less motivation and energy and enjoyment. Mild irritability. 77 yo GD doing well in driving and school. Plan: Agree to increase fluoxetine to 40 mg daily.  05/26/20 appt noted: Saw benefit with anxiety after increase fluoxetine and feels better physically.  No SE. Better interest and enjoyment with things.  Not sig depressed now.  More productive.  W noticed he's better. Cardiology said he was boring.  Lost 20# last year.   B was last sibling.  12/21/20 appt noted: GD moved back in with them and F giving up custody.  F agreed for her to move back.  With added stress continuing this dose is desired.  Going well with GD.  She's in counseling.  She's doing well with school. Less irritable with increase fluoxetine to 40 mg daily and wife notices.  Patient reports stable mood and denies depressed or irritable moods.   Patient denies any recent difficulty with anxiety.  Patient denies difficulty with sleep initiation or maintenance. RLS managed with ropinirole.  Denies appetite disturbance.  Patient reports that energy and motivation have been good.  Patient denies any difficulty with concentration.  Patient denies any suicidal ideation.  07/16/22 appt noted: Missed appt since her.  Got date mixed up.  Same day found wife in floor having SZ.  Remote history of stroke.   Been doing ok overall. Continues fluoxetine 40 mg daily.  Previous note says increase helped mood and iritabliity.   A year ago 07-03-2022 D died unexpectedly.   D was not a good mother.  Therefore they got custody of GD.  GD reacted strongly to this.  GD was bullied in public school but completed HS online.   Sleep 10-12 hours at a time. Using bipap and working. Health most affected by psoriatic arthritis.  Multiple medical problems.  Feels so much better after ablation for VT.   Marland KitchenPast Psychiatric Medication Trials: Zoloft, Trazodone, fluoxetine 30  He has been under my care for many years, 77.  On Prozac all those years.    Review of Systems:  Review of Systems  Cardiovascular:  Negative for palpitations.  Musculoskeletal:  Positive for arthralgias and gait problem.  Neurological:  Positive for weakness. Negative for tremors and numbness.  Psychiatric/Behavioral:  Negative for agitation, behavioral problems, confusion, decreased concentration, dysphoric mood, hallucinations, self-injury, sleep disturbance and suicidal ideas. The patient is not nervous/anxious and is not hyperactive.     Medications: I have reviewed the patient's current medications.  Current Outpatient Medications  Medication Sig Dispense Refill   Accu-Chek FastClix Lancets MISC Use as directed to check blood sugars. Dx E11.9 102 each 3   Blood Glucose Calibration (ACCU-CHEK GUIDE CONTROL) LIQD USE AS DIRECTED 1 each 0   Blood Glucose Monitoring Suppl (ACCU-CHEK GUIDE)  w/Device KIT 1 kit by In Vitro route daily. 1 kit 0   Calcium Citrate-Vitamin D (CITRACAL + D PO) Take 1 tablet by mouth daily.      cetirizine (ZYRTEC) 10 MG tablet Take 10 mg by mouth at bedtime.     Cholecalciferol (VITAMIN D3) 5000 UNITS TABS Take 5,000 Units every morning by mouth.      clobetasol cream (TEMOVATE) 0.05 % APPLY TOPICALLY TO THE AFFECTED AREA TWICE DAILY 30 g 0   cyanocobalamin (,VITAMIN B-12,) 1000 MCG/ML injection Inject 1 mL (1,000 mcg total) into the muscle every 30 (thirty) days. 1 mL    ELIQUIS 5 MG TABS tablet Take 1 tablet (5 mg total) by mouth 2 (two) times daily. 180 tablet 3   fluticasone (FLONASE) 50 MCG/ACT nasal spray Place 2 sprays into both nostrils daily as needed for rhinitis.      glipiZIDE (GLUCOTROL) 5 MG tablet Take 1 tablet (5 mg total) by mouth daily before breakfast. 90 tablet 3   glucose blood (ACCU-CHEK GUIDE) test strip Use to check blood sugar dailky. Dx E11.9 100 strip 3   Ixekizumab (TALTZ) 80 MG/ML SOAJ INJECT 80MG  (1 PEN) UNDER THE SKIN EVERY 4 WEEKS 3 mL 0   lisinopril (ZESTRIL) 40 MG tablet Take by mouth.     MELATONIN GUMMIES PO Take 2 tablets by mouth at bedtime. CHEW     metFORMIN (GLUCOPHAGE-XR) 750 MG 24 hr tablet Take 1 tablet (750 mg total) by mouth 2 (two) times daily. 180 tablet 3   Multiple Vitamins-Minerals (PRESERVISION AREDS 2) CAPS Take 1 capsule by mouth 2 (two) times daily.     pravastatin (PRAVACHOL) 80 MG tablet Take 80 mg by mouth daily.     pregabalin (LYRICA) 100 MG capsule Take 1 capsule (100 mg total) by mouth 2 (two) times daily. 180 capsule 1   rOPINIRole (REQUIP) 1 MG tablet Take 1 tablet (1 mg total) by mouth at bedtime. 90 tablet 3   tamsulosin (FLOMAX) 0.4 MG CAPS capsule TAKE ONE CAPSULE BY MOUTH DAILY 90 capsule 0   TORSEMIDE PO Take 30 mg by mouth daily.     FLUoxetine (PROZAC) 20 MG capsule Take 2 capsules (40 mg total) by mouth daily. 180 capsule 3   No current facility-administered medications for this  visit.    Medication Side Effects: None  Allergies:  Allergies  Allergen Reactions   Cheese Anaphylaxis    Bacteria in aged cheeses cause Anaphylactic reaction Patient can tolerate cheese that is not aged, such as ricotta, cream cheese and cottage cheese   Verapamil Shortness Of Breath and Other (See Comments)    CP, irregular/slow HR, dizziness, heartburn, drowsiness, weakness   Zithromax [Azithromycin Dihydrate] Swelling    Swelling (arms/legs/scrotum)   Oxycodone Itching and Other (See Comments)    Sweats and itching- tolerates hydrocodone   Acyclovir And Related Other (See Comments)    Told by MD   Amiodarone Other (See Comments)    Acute lung  toxicity   Arava [Leflunomide]     Lack of effect   Latex Itching   Methotrexate Derivatives     Intolerant- chills, aches, diarrhea.     Adhesive [Tape] Itching and Rash   Amlodipine Other (See Comments)    Muscle pain    Past Medical History:  Diagnosis Date   Allergic rhinitis    Anticoagulant long-term use    eliquis   Anxiety    Arthritis    WRISTS, KNEES, ANKLES   CAD (coronary artery disease) CARDIOLOGIST-  DR GREGG TAYLOR   Nonobstructive CAD by cath 2006;  HEART CATH AGAIN ON 06/08/13 AFTER CHEST DISCOMFORT / ADMISSION TO [facility] - "MILD NON-OBSTRUCTIVE CAD, NORMAL LV SYSTOLIC FUNCTION"   Depression    Diastolic CHF dx 06/2011--- cardiologist-  dr Sharlot Gowda taylor   EF 50-55%  per echo 05/2016   GERD (gastroesophageal reflux disease)    H/O cardiac radiofrequency ablation    History of kidney stones    HTN (hypertension)    Hyperlipidemia    OSA treated with BiPAP followed dr Vassie Loll (previously dr clance)   per study 02-04-2012 very severe osa AHI 90/hr--  currently uses Bi-Pap every night per pt    Paroxysmal VT cardiologist--  dr Sharlot Gowda taylor   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 Corliss Skains   PTSD (post-traumatic stress disorder)    Restless leg syndrome    Stroke    Type 2 diabetes mellitus    Vertigo    dx by Dr. Para March per patient     Family History  Problem Relation Age of Onset   Cancer Mother        benign tumor, died from surgery complications   Other Mother        pituitary adenoma   Sudden death Father 45   Heart disease Father        MI at 63   Heart disease Brother        stents and PPM   Parkinson's disease Brother    Heart disease Brother    Cancer Brother        multiple myeloma   Sudden death Paternal Grandmother 30   Heart disease Maternal Uncle    Sudden death Paternal Uncle 50   Heart disease Paternal Uncle    COPD Paternal Uncle    Colon cancer Neg Hx    Prostate cancer Neg Hx     Social History   Socioeconomic History   Marital status: Married    Spouse name: Not on file   Number of children: 1   Years of education: Not on file   Highest education level: Not on file  Occupational History   Occupation: retired    Comment: Geophysicist/field seismologist  Tobacco Use   Smoking status: Former    Packs/day: 3.00    Years: 2.00    Additional pack years: 0.00    Total pack years: 6.00    Types: Cigarettes    Quit date: 08/21/1980    Years since quitting: 41.9    Passive exposure: Never   Smokeless tobacco: Never  Vaping Use   Vaping Use: Never  used  Substance and Sexual Activity   Alcohol use: Yes    Alcohol/week: 0.0 standard drinks of alcohol    Comment: occasional   Drug use: No   Sexual activity: Not Currently  Other Topics Concern   Not on file  Social History Narrative   Married 1971   [employer] grad   1 daughter   Pt's granddaughter lives with patient   Retired as Geophysicist/field seismologist and nonprofit/financial work with [employer] Pulmonary (10/23/16):    Originally from [facility]. Previously lived in [place] shortly. Previously was a [employer]. He worked mostly with non-profit groups. Does have a cat currently. Remote bird exposure. No hot tub or mold exposure. Remote travel to [place], [place], [place]    Social Determinants of Health   Financial Resource Strain: Low Risk  (03/16/2019)   Overall Financial Resource Strain (CARDIA)    Difficulty of Paying Living Expenses: Not hard at all  Food Insecurity: No Food Insecurity (03/16/2019)   Hunger Vital Sign    Worried About Running Out of Food in the Last Year: Never true    Ran Out of Food in the Last Year: Never true  Transportation Needs: No Transportation Needs (03/16/2019)   PRAPARE - Administrator, Civil Service (Medical): No    Lack of Transportation (Non-Medical): No  Physical Activity: Inactive (03/16/2019)   Exercise Vital Sign    Days of Exercise per Week: 0 days    Minutes of Exercise per Session: 0 min  Stress: No Stress Concern Present (03/16/2019)   [employer] of Occupational Health - Occupational Stress Questionnaire    Feeling of Stress : Only a little  Social Connections: Not on file  Intimate Partner Violence: Not At Risk (03/16/2019)   Humiliation, Afraid, Rape, and Kick questionnaire    Fear of Current or Ex-Partner: No    Emotionally Abused: No    Physically Abused: No    Sexually Abused: No    Past Medical History, Surgical history, Social history, and Family history were reviewed and updated as appropriate.   Please see review of systems for further details on the patient's review from today.   Objective:   Physical Exam:  There were no vitals taken for this visit.  Physical Exam Neurological:     Mental Status: He is alert and oriented to person, place, and time.     Cranial Nerves: No dysarthria.  Psychiatric:        Attention and Perception: Attention normal.        Mood and Affect: Mood is not anxious or  depressed.        Speech: Speech normal.        Behavior: Behavior is not slowed. Behavior is cooperative.        Thought Content: Thought content normal. Thought content is not paranoid or delusional. Thought content does not include homicidal or suicidal ideation. Thought content does not include suicidal plan.        Cognition and Memory: Cognition and memory normal.        Judgment: Judgment normal.     Comments: Good insight and judment     Lab Review:     Component Value Date/Time   NA 139 04/05/2022 0932   NA 140 11/11/2019 1418   NA 139 09/03/2014 1039   K 4.2 04/05/2022 0932   K 4.3 09/03/2014 1039   CL 106 04/05/2022 0932   CO2 23 04/05/2022  0932   CO2 23 09/03/2014 1039   GLUCOSE 159 (H) 04/05/2022 0932   GLUCOSE 156 (H) 09/03/2014 1039   BUN 25 (H) 04/05/2022 0932   BUN 22 11/11/2019 1418   BUN 23.0 09/03/2014 1039   CREATININE 1.32 04/05/2022 0932   CREATININE 1.44 (H) 11/15/2021 1418   CREATININE 1.0 09/03/2014 1039   CALCIUM 8.9 04/05/2022 0932   CALCIUM 9.3 09/03/2014 1039   PROT 6.9 04/05/2022 0932   PROT 7.2 11/11/2019 1418   PROT 7.5 09/03/2014 1039   ALBUMIN 3.8 04/05/2022 0932   ALBUMIN 4.0 11/11/2019 1418   ALBUMIN 3.5 09/03/2014 1039   AST 10 04/05/2022 0932   AST 15 09/03/2014 1039   ALT 9 04/05/2022 0932   ALT 14 09/03/2014 1039   ALKPHOS 116 04/05/2022 0932   ALKPHOS 125 09/03/2014 1039   BILITOT 0.6 04/05/2022 0932   BILITOT 0.6 11/11/2019 1418   BILITOT 1.10 09/03/2014 1039   GFRNONAA 66 02/11/2020 1441   GFRAA 77 02/11/2020 1441       Component Value Date/Time   WBC 7.3 04/25/2022 1450   RBC 3.31 (L) 04/25/2022 1450   HGB 10.3 (L) 04/25/2022 1450   HGB 11.9 (L) 11/11/2019 1418   HGB 12.3 (L) 09/03/2014 1037   HCT 31.0 (L) 04/25/2022 1450   HCT 30.6 (L) 04/25/2022 1450   HCT 36.1 (L) 09/03/2014 1037   PLT 186 04/25/2022 1450   PLT 247 11/11/2019 1418   MCV 92.4 04/25/2022 1450   MCV 93 11/11/2019 1418   MCV 88.6 09/03/2014  1037   MCH 31.1 04/25/2022 1450   MCHC 33.7 04/25/2022 1450   RDW 14.2 04/25/2022 1450   RDW 12.9 11/11/2019 1418   RDW 13.9 09/03/2014 1037   LYMPHSABS 1.2 04/25/2022 1450   LYMPHSABS 1.5 11/11/2019 1418   LYMPHSABS 1.2 09/03/2014 1037   MONOABS 0.5 04/25/2022 1450   MONOABS 0.4 09/03/2014 1037   EOSABS 0.4 04/25/2022 1450   EOSABS 0.4 11/11/2019 1418   BASOSABS 0.1 04/25/2022 1450   BASOSABS 0.1 11/11/2019 1418   BASOSABS 0.1 09/03/2014 1037    No results found for: "POCLITH", "LITHIUM"   No results found for: "PHENYTOIN", "PHENOBARB", "VALPROATE", "CBMZ"   .res Assessment: Plan:    Recurrent major depression in complete remission  PTSD (post-traumatic stress disorder) - Plan: FLUoxetine (PROZAC) 20 MG capsule  Major depressive disorder, recurrent episode, moderate - Plan: FLUoxetine (PROZAC) 20 MG capsule   30 min face to face time with patient . We discussed  hgistory of longterm fluoxetine.  Supportive therapy over situation with GD & death of her mother and pt's D.    Encourage activity. And exercise.  continiue fluoxetine to 40 mg daily. More even keeled after the increase.  Mood and anxiety managed.  Doing fine without sleep meds.  Disc long duration of sleep but feels better in bed DT arthritis.  Disc incr risk bleeding on Eloquis.  FU 12 mos  Meredith Staggers, MD, DFAPA   Please see After Visit Summary for patient specific instructions.  Future Appointments  Date Time Provider Department Center  07/26/2022  2:30 PM [facility] NURSE [facility]  09/18/2022  1:00 PM Gearldine Bienenstock, PA-C [facility] None  10/24/2022  1:30 PM Rachel Moulds, MD [facility] None    No orders of the defined types were placed in this encounter.     -------------------------------

## 2022-07-17 DIAGNOSIS — H353221 Exudative age-related macular degeneration, left eye, with active choroidal neovascularization: Secondary | ICD-10-CM | POA: Diagnosis not present

## 2022-07-17 LAB — CBC WITH DIFFERENTIAL/PLATELET
Absolute Monocytes: 393 {cells}/uL (ref 200–950)
Basophils Absolute: 41 {cells}/uL (ref 0–200)
Basophils Relative: 0.6 %
Eosinophils Absolute: 338 {cells}/uL (ref 15–500)
Eosinophils Relative: 4.9 %
HCT: 31 % — ABNORMAL LOW (ref 38.5–50.0)
Hemoglobin: 10 g/dL — ABNORMAL LOW (ref 13.2–17.1)
Lymphs Abs: 1132 {cells}/uL (ref 850–3900)
MCH: 29.1 pg (ref 27.0–33.0)
MCHC: 32.3 g/dL (ref 32.0–36.0)
MCV: 90.1 fL (ref 80.0–100.0)
MPV: 10.7 fL (ref 7.5–12.5)
Monocytes Relative: 5.7 %
Neutro Abs: 4996 {cells}/uL (ref 1500–7800)
Neutrophils Relative %: 72.4 %
Platelets: 215 10*3/uL (ref 140–400)
RBC: 3.44 Million/uL — ABNORMAL LOW (ref 4.20–5.80)
RDW: 13.4 % (ref 11.0–15.0)
Total Lymphocyte: 16.4 %
WBC: 6.9 10*3/uL (ref 3.8–10.8)

## 2022-07-17 LAB — COMPLETE METABOLIC PANEL WITHOUT GFR
AG Ratio: 1.1 (calc) (ref 1.0–2.5)
ALT: 10 U/L (ref 9–46)
AST: 13 U/L (ref 10–35)
Albumin: 3.8 g/dL (ref 3.6–5.1)
Alkaline phosphatase (APISO): 113 U/L (ref 35–144)
BUN: 19 mg/dL (ref 7–25)
CO2: 25 mmol/L (ref 20–32)
Calcium: 9 mg/dL (ref 8.6–10.3)
Chloride: 106 mmol/L (ref 98–110)
Creat: 1.2 mg/dL (ref 0.70–1.28)
Globulin: 3.4 g/dL (ref 1.9–3.7)
Glucose, Bld: 124 mg/dL — ABNORMAL HIGH (ref 65–99)
Potassium: 3.8 mmol/L (ref 3.5–5.3)
Sodium: 143 mmol/L (ref 135–146)
Total Bilirubin: 0.9 mg/dL (ref 0.2–1.2)
Total Protein: 7.2 g/dL (ref 6.1–8.1)
eGFR: 63 mL/min/{1.73_m2}

## 2022-07-17 NOTE — Progress Notes (Signed)
Glucose is 124. Rest of CMP WNL.  Anemia is stable. Please clarify if he if has undergone a workup for anemia?

## 2022-07-19 ENCOUNTER — Telehealth: Payer: Self-pay | Admitting: Family Medicine

## 2022-07-19 NOTE — Telephone Encounter (Signed)
Contacted [name] to schedule their annual wellness visit. Appointment made for 08/02/2022.  [facility] TEAM Direct Dial: [phone]

## 2022-07-26 ENCOUNTER — Ambulatory Visit (INDEPENDENT_AMBULATORY_CARE_PROVIDER_SITE_OTHER): Payer: Medicare Other

## 2022-07-26 DIAGNOSIS — E538 Deficiency of other specified B group vitamins: Secondary | ICD-10-CM | POA: Diagnosis not present

## 2022-07-26 MED ORDER — CYANOCOBALAMIN 1000 MCG/ML IJ SOLN
1000.0000 ug | Freq: Once | INTRAMUSCULAR | Status: AC
  Administered 2022-07-26: 1000 ug via INTRAMUSCULAR

## 2022-07-26 NOTE — Progress Notes (Signed)
Per orders of Dr. Crawford Givens, injection of B-12 given by Lonia Blood in right deltoid. Patient tolerated injection well. Patient will make appointment for 1 month.

## 2022-08-02 ENCOUNTER — Ambulatory Visit (INDEPENDENT_AMBULATORY_CARE_PROVIDER_SITE_OTHER): Payer: Medicare Other

## 2022-08-02 VITALS — Ht 69.5 in | Wt 275.0 lb

## 2022-08-02 DIAGNOSIS — Z0000 Encounter for general adult medical examination without abnormal findings: Secondary | ICD-10-CM
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
## 2022-08-02 NOTE — Patient Instructions (Signed)
[name] , Thank you for taking time to come for your Medicare Wellness Visit. I appreciate your ongoing commitment to your health goals. Please review the following plan we discussed and let me know if I can assist you in the future.   These are the goals we discussed:  Goals      Increase physical activity     Starting 01/27/2018, I will continue to walk at least 15 minutes daily.      Patient Stated     03/16/2019, I will try to lose 10 lbs in the next 6 months.      Patient Stated     Lose 50lb        This is a list of the screening recommended for you and due dates:  Health Maintenance  Topic Date Due   DTaP/Tdap/Td vaccine (1 - Tdap) Never done   Zoster (Shingles) Vaccine (1 of 2) Never done   Eye exam for diabetics  12/28/2021   Complete foot exam   07/01/2022   Hemoglobin A1C  10/04/2022   Flu Shot  11/01/2022   Yearly kidney health urinalysis for diabetes  04/06/2023   Yearly kidney function blood test for diabetes  07/16/2023   Medicare Annual Wellness Visit  08/02/2023   Pneumonia Vaccine  Completed   Hepatitis C Screening: USPSTF Recommendation to screen - Ages 76-79 yo.  Completed   HPV Vaccine  Aged Out   COVID-19 Vaccine  Discontinued   Cologuard (Stool DNA test)  Discontinued    Advanced directives: Please bring a copy of your health care power of attorney and living will to the office to be added to your chart at your convenience.   Conditions/risks identified: .a  Next appointment: Follow up in one year for your annual wellness visit. 08/06/23 @ 1pm televisit  Preventive Care 76 Years and Older, Male  Preventive care refers to lifestyle choices and visits with your health care provider that can promote health and wellness. What does preventive care include? A yearly physical exam. This is also called an annual well check. Dental exams once or twice a year. Routine eye exams. Ask your health care provider how often you should have your eyes  checked. Personal lifestyle choices, including: Daily care of your teeth and gums. Regular physical activity. Eating a healthy diet. Avoiding tobacco and drug use. Limiting alcohol use. Practicing safe sex. Taking low doses of aspirin every day. Taking vitamin and mineral supplements as recommended by your health care provider. What happens during an annual well check? The services and screenings done by your health care provider during your annual well check will depend on your age, overall health, lifestyle risk factors, and family history of disease. Counseling  Your health care provider may ask you questions about your: Alcohol use. Tobacco use. Drug use. Emotional well-being. Home and relationship well-being. Sexual activity. Eating habits. History of falls. Memory and ability to understand (cognition). Work and work Astronomer. Screening  You may have the following tests or measurements: Height, weight, and BMI. Blood pressure. Lipid and cholesterol levels. These may be checked every 5 years, or more frequently if you are over 76 years old. Skin check. Lung cancer screening. You may have this screening every year starting at age 76 if you have a 30-pack-year history of smoking and currently smoke or have quit within the past 15 years. Fecal occult blood test (FOBT) of the stool. You may have this test every year starting at age 76. Flexible sigmoidoscopy  or colonoscopy. You may have a sigmoidoscopy every 5 years or a colonoscopy every 10 years starting at age 76. Prostate cancer screening. Recommendations will vary depending on your family history and other risks. Hepatitis C blood test. Hepatitis B blood test. Sexually transmitted disease (STD) testing. Diabetes screening. This is done by checking your blood sugar (glucose) after you have not eaten for a while (fasting). You may have this done every 1-3 years. Abdominal aortic aneurysm (AAA) screening. You may need this  if you are a current or former smoker. Osteoporosis. You may be screened starting at age 76 if you are at high risk. Talk with your health care provider about your test results, treatment options, and if necessary, the need for more tests. Vaccines  Your health care provider may recommend certain vaccines, such as: Influenza vaccine. This is recommended every year. Tetanus, diphtheria, and acellular pertussis (Tdap, Td) vaccine. You may need a Td booster every 10 years. Zoster vaccine. You may need this after age 76. Pneumococcal 13-valent conjugate (PCV13) vaccine. One dose is recommended after age 76. Pneumococcal polysaccharide (PPSV23) vaccine. One dose is recommended after age 76. Talk to your health care provider about which screenings and vaccines you need and how often you need them. This information is not intended to replace advice given to you by your health care provider. Make sure you discuss any questions you have with your health care provider. Document Released: 04/15/2015 Document Revised: 12/07/2015 Document Reviewed: 01/18/2015 Elsevier Interactive Patient Education  2017 [employer].  Fall Prevention in the Home Falls can cause injuries. They can happen to people of all ages. There are many things you can do to make your home safe and to help prevent falls. What can I do on the outside of my home? Regularly fix the edges of walkways and driveways and fix any cracks. Remove anything that might make you trip as you walk through a door, such as a raised step or threshold. Trim any bushes or trees on the path to your home. Use bright outdoor lighting. Clear any walking paths of anything that might make someone trip, such as rocks or tools. Regularly check to see if handrails are loose or broken. Make sure that both sides of any steps have handrails. Any raised decks and porches should have guardrails on the edges. Have any leaves, snow, or ice cleared regularly. Use sand  or salt on walking paths during winter. Clean up any spills in your garage right away. This includes oil or grease spills. What can I do in the bathroom? Use night lights. Install grab bars by the toilet and in the tub and shower. Do not use towel bars as grab bars. Use non-skid mats or decals in the tub or shower. If you need to sit down in the shower, use a plastic, non-slip stool. Keep the floor dry. Clean up any water that spills on the floor as soon as it happens. Remove soap buildup in the tub or shower regularly. Attach bath mats securely with double-sided non-slip rug tape. Do not have throw rugs and other things on the floor that can make you trip. What can I do in the bedroom? Use night lights. Make sure that you have a light by your bed that is easy to reach. Do not use any sheets or blankets that are too big for your bed. They should not hang down onto the floor. Have a firm chair that has side arms. You can use this for support  while you get dressed. Do not have throw rugs and other things on the floor that can make you trip. What can I do in the kitchen? Clean up any spills right away. Avoid walking on wet floors. Keep items that you use a lot in easy-to-reach places. If you need to reach something above you, use a strong step stool that has a grab bar. Keep electrical cords out of the way. Do not use floor polish or wax that makes floors slippery. If you must use wax, use non-skid floor wax. Do not have throw rugs and other things on the floor that can make you trip. What can I do with my stairs? Do not leave any items on the stairs. Make sure that there are handrails on both sides of the stairs and use them. Fix handrails that are broken or loose. Make sure that handrails are as long as the stairways. Check any carpeting to make sure that it is firmly attached to the stairs. Fix any carpet that is loose or worn. Avoid having throw rugs at the top or bottom of the stairs.  If you do have throw rugs, attach them to the floor with carpet tape. Make sure that you have a light switch at the top of the stairs and the bottom of the stairs. If you do not have them, ask someone to add them for you. What else can I do to help prevent falls? Wear shoes that: Do not have high heels. Have rubber bottoms. Are comfortable and fit you well. Are closed at the toe. Do not wear sandals. If you use a stepladder: Make sure that it is fully opened. Do not climb a closed stepladder. Make sure that both sides of the stepladder are locked into place. Ask someone to hold it for you, if possible. Clearly mark and make sure that you can see: Any grab bars or handrails. First and last steps. Where the edge of each step is. Use tools that help you move around (mobility aids) if they are needed. These include: Canes. Walkers. Scooters. Crutches. Turn on the lights when you go into a dark area. Replace any light bulbs as soon as they burn out. Set up your furniture so you have a clear path. Avoid moving your furniture around. If any of your floors are uneven, fix them. If there are any pets around you, be aware of where they are. Review your medicines with your doctor. Some medicines can make you feel dizzy. This can increase your chance of falling. Ask your doctor what other things that you can do to help prevent falls. This information is not intended to replace advice given to you by your health care provider. Make sure you discuss any questions you have with your health care provider. Document Released: 01/13/2009 Document Revised: 08/25/2015 Document Reviewed: 04/23/2014 Elsevier Interactive Patient Education  2017 [employer].

## 2022-08-05 ENCOUNTER — Telehealth: Payer: Self-pay | Admitting: Family Medicine

## 2022-08-05 NOTE — Telephone Encounter (Signed)
Please check with patient and see about him getting set up for Cologuard testing.  Let me know if he needs a new order.  Thanks.

## 2022-08-06 NOTE — Telephone Encounter (Signed)
Spoke with patient and he has the kit he received sometime this year. He is going to send it back in once he gets it done.

## 2022-08-11 ENCOUNTER — Other Ambulatory Visit: Payer: Self-pay | Admitting: Family Medicine

## 2022-08-13 NOTE — Telephone Encounter (Signed)
Refill request for  PREGABALIN 100MG  CAPSULES   LOV - 04/13/22 Next OV - not scheduled Last refill - 02/06/22 #180/1

## 2022-08-14 NOTE — Telephone Encounter (Signed)
Please see about getting DM2 f/u set for this summer.  Thanks.

## 2022-08-14 NOTE — Telephone Encounter (Signed)
LVM to call the office and sched

## 2022-08-20 NOTE — Telephone Encounter (Signed)
Patient scheduled for 08-21-22

## 2022-08-21 ENCOUNTER — Encounter: Payer: Self-pay | Admitting: Family Medicine

## 2022-08-21 ENCOUNTER — Ambulatory Visit (INDEPENDENT_AMBULATORY_CARE_PROVIDER_SITE_OTHER): Payer: Medicare Other | Admitting: Family Medicine

## 2022-08-21 VITALS — BP 118/68 | HR 63 | Temp 97.9°F | Ht 69.5 in | Wt 267.0 lb

## 2022-08-21 DIAGNOSIS — E1149 Type 2 diabetes mellitus with other diabetic neurological complication: Secondary | ICD-10-CM

## 2022-08-21 DIAGNOSIS — Z7984 Long term (current) use of oral hypoglycemic drugs: Secondary | ICD-10-CM | POA: Diagnosis not present

## 2022-08-21 DIAGNOSIS — D649 Anemia, unspecified: Secondary | ICD-10-CM | POA: Diagnosis not present

## 2022-08-21 DIAGNOSIS — R399 Unspecified symptoms and signs involving the genitourinary system: Secondary | ICD-10-CM | POA: Diagnosis not present

## 2022-08-21 DIAGNOSIS — E538 Deficiency of other specified B group vitamins: Secondary | ICD-10-CM

## 2022-08-21 DIAGNOSIS — G2581 Restless legs syndrome: Secondary | ICD-10-CM | POA: Diagnosis not present

## 2022-08-21 DIAGNOSIS — B309 Viral conjunctivitis, unspecified: Secondary | ICD-10-CM | POA: Diagnosis not present

## 2022-08-21 DIAGNOSIS — M79673 Pain in unspecified foot: Secondary | ICD-10-CM | POA: Diagnosis not present

## 2022-08-21 DIAGNOSIS — G629 Polyneuropathy, unspecified: Secondary | ICD-10-CM

## 2022-08-21 DIAGNOSIS — R21 Rash and other nonspecific skin eruption: Secondary | ICD-10-CM | POA: Diagnosis not present

## 2022-08-21 DIAGNOSIS — L4050 Arthropathic psoriasis, unspecified: Secondary | ICD-10-CM

## 2022-08-21 MED ORDER — TAMSULOSIN HCL 0.4 MG PO CAPS: 0.8000 mg | ORAL_CAPSULE | Freq: Every day | ORAL | Status: DC

## 2022-08-21 NOTE — Patient Instructions (Addendum)
Go to the lab on the way out.   If you have mychart we'll likely use that to update you.    Take care.  Glad to see you. Try taking 2 of the flomax at night and see if that helps with urination.   See if constant use of inserts helps with the pain.  You may need up needing a metatarsal pain.  Unclear if this is related to neuropathy.

## 2022-08-21 NOTE — Progress Notes (Unsigned)
Seen for L conjunctivitis.  Had eye clinic eval today.    Lyrica/requip/taltz/flomax use d/w pt.    He is going to see rheumatology about taltz.  More hand pain in the meantime. More ankle knee and foot pain.   D/w pt about more neuropathy pain.  Still on B12.  Lyrica dulled the pain some.  Sugar has been about 120 in the AMs.    Legs are still jumpy at night, in spite of requip.    He is leaking urine.  Stream is weak.    He had constant aches at the distal MTs on the plantar side of his feet.  No change with inserts.    Unclear if from neuropathy/talz/dropped MT heads.    Itchy rash R hip for >1 month. He'll try clobetasol and update me as needed.

## 2022-08-22 ENCOUNTER — Other Ambulatory Visit: Payer: Self-pay | Admitting: Family Medicine

## 2022-08-22 DIAGNOSIS — M79673 Pain in unspecified foot: Secondary | ICD-10-CM | POA: Insufficient documentation

## 2022-08-22 DIAGNOSIS — R21 Rash and other nonspecific skin eruption: Secondary | ICD-10-CM | POA: Insufficient documentation

## 2022-08-22 LAB — VITAMIN B12: Vitamin B-12: 637 pg/mL (ref 211–911)

## 2022-08-22 LAB — FERRITIN: Ferritin: 87.2 ng/mL (ref 22.0–322.0)

## 2022-08-22 LAB — HEMOGLOBIN A1C: Hgb A1c MFr Bld: 6.2 % (ref 4.6–6.5)

## 2022-08-22 MED ORDER — ROPINIROLE HCL 0.5 MG PO TABS: 1.5000 mg | ORAL_TABLET | Freq: Every day | ORAL | 1 refills | Status: DC

## 2022-08-22 NOTE — Assessment & Plan Note (Signed)
He'll try topical clobetasol and update me as needed.  This looks to be irritated but not infected.

## 2022-08-22 NOTE — Assessment & Plan Note (Signed)
Would increase Flomax to 0.8 mg/day with routine cautions discussed with patient.

## 2022-08-22 NOTE — Assessment & Plan Note (Signed)
We may need to alter his Requip dose but I want to see his labs first.

## 2022-08-22 NOTE — Assessment & Plan Note (Signed)
He is going to follow-up with rheumatology about Altamease Oiler use.

## 2022-08-22 NOTE — Assessment & Plan Note (Signed)
Continue Lyrica for now.  See notes on labs.

## 2022-08-22 NOTE — Assessment & Plan Note (Signed)
Unclear if from neuropathy/lack of effect from talz/dropped MT heads, or some combination of all the above.  We can check his labs in the meantime, he can try using inserts consistently and he can check with rheumatology about thoughts use.

## 2022-08-28 DIAGNOSIS — H353221 Exudative age-related macular degeneration, left eye, with active choroidal neovascularization: Secondary | ICD-10-CM | POA: Diagnosis not present

## 2022-09-04 ENCOUNTER — Ambulatory Visit (INDEPENDENT_AMBULATORY_CARE_PROVIDER_SITE_OTHER): Payer: Medicare Other

## 2022-09-04 DIAGNOSIS — E538 Deficiency of other specified B group vitamins: Secondary | ICD-10-CM | POA: Diagnosis not present

## 2022-09-04 MED ORDER — CYANOCOBALAMIN 1000 MCG/ML IJ SOLN
1000.0000 ug | Freq: Once | INTRAMUSCULAR | Status: AC
Start: 2022-09-04 — End: 2022-09-04
  Administered 2022-09-04: 1000 ug via INTRAMUSCULAR

## 2022-09-04 NOTE — Progress Notes (Signed)
Per orders of Dr. Graham Duncan, injection of Vitamin B12 given by Kumari Sculley N Mariany Mackintosh in left deltoid. Patient tolerated injection well. Patient will make appointment for 1 month.   
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
## 2022-09-18 ENCOUNTER — Other Ambulatory Visit: Payer: Self-pay | Admitting: Physician Assistant

## 2022-09-18 ENCOUNTER — Ambulatory Visit: Payer: Medicare Other | Attending: Physician Assistant | Admitting: Physician Assistant

## 2022-09-18 ENCOUNTER — Encounter: Payer: Self-pay | Admitting: Physician Assistant

## 2022-09-18 ENCOUNTER — Ambulatory Visit (INDEPENDENT_AMBULATORY_CARE_PROVIDER_SITE_OTHER): Payer: Medicare Other

## 2022-09-18 VITALS — BP 137/82 | HR 50 | Resp 17 | Ht 69.5 in | Wt 271.2 lb

## 2022-09-18 DIAGNOSIS — M19042 Primary osteoarthritis, left hand: Secondary | ICD-10-CM | POA: Insufficient documentation

## 2022-09-18 DIAGNOSIS — Z8639 Personal history of other endocrine, nutritional and metabolic disease: Secondary | ICD-10-CM | POA: Diagnosis not present

## 2022-09-18 DIAGNOSIS — M19071 Primary osteoarthritis, right ankle and foot: Secondary | ICD-10-CM | POA: Insufficient documentation

## 2022-09-18 DIAGNOSIS — M542 Cervicalgia: Secondary | ICD-10-CM | POA: Insufficient documentation

## 2022-09-18 DIAGNOSIS — Z8669 Personal history of other diseases of the nervous system and sense organs: Secondary | ICD-10-CM | POA: Insufficient documentation

## 2022-09-18 DIAGNOSIS — M722 Plantar fascial fibromatosis: Secondary | ICD-10-CM | POA: Diagnosis not present

## 2022-09-18 DIAGNOSIS — Z79899 Other long term (current) drug therapy: Secondary | ICD-10-CM | POA: Insufficient documentation

## 2022-09-18 DIAGNOSIS — I472 Ventricular tachycardia, unspecified: Secondary | ICD-10-CM | POA: Diagnosis not present

## 2022-09-18 DIAGNOSIS — M19041 Primary osteoarthritis, right hand: Secondary | ICD-10-CM | POA: Diagnosis not present

## 2022-09-18 DIAGNOSIS — L4050 Arthropathic psoriasis, unspecified: Secondary | ICD-10-CM | POA: Diagnosis not present

## 2022-09-18 DIAGNOSIS — Z7901 Long term (current) use of anticoagulants: Secondary | ICD-10-CM | POA: Insufficient documentation

## 2022-09-18 DIAGNOSIS — Z9181 History of falling: Secondary | ICD-10-CM | POA: Insufficient documentation

## 2022-09-18 DIAGNOSIS — G2581 Restless legs syndrome: Secondary | ICD-10-CM | POA: Diagnosis not present

## 2022-09-18 DIAGNOSIS — Z87448 Personal history of other diseases of urinary system: Secondary | ICD-10-CM | POA: Diagnosis not present

## 2022-09-18 DIAGNOSIS — Z8679 Personal history of other diseases of the circulatory system: Secondary | ICD-10-CM | POA: Diagnosis not present

## 2022-09-18 DIAGNOSIS — Z96653 Presence of artificial knee joint, bilateral: Secondary | ICD-10-CM | POA: Insufficient documentation

## 2022-09-18 DIAGNOSIS — M19072 Primary osteoarthritis, left ankle and foot: Secondary | ICD-10-CM | POA: Diagnosis not present

## 2022-09-18 DIAGNOSIS — M5136 Other intervertebral disc degeneration, lumbar region: Secondary | ICD-10-CM | POA: Diagnosis not present

## 2022-09-18 DIAGNOSIS — L409 Psoriasis, unspecified: Secondary | ICD-10-CM | POA: Diagnosis not present

## 2022-09-18 DIAGNOSIS — M7662 Achilles tendinitis, left leg: Secondary | ICD-10-CM | POA: Insufficient documentation

## 2022-09-18 NOTE — Patient Instructions (Addendum)
Standing Labs We placed an order today for your standing lab work.   Please have your standing labs drawn in July and every 3 months    Please have your labs drawn 2 weeks prior to your appointment so that the provider can discuss your lab results at your appointment, if possible.  Please note that you may see your imaging and lab results in MyChart before we have reviewed them. We will contact you once all results are reviewed. Please allow our office up to 72 hours to thoroughly review all of the results before contacting the office for clarification of your results.  WALK-IN LAB HOURS  Monday through Thursday from 8:00 am -12:30 pm and 1:00 pm-5:00 pm and Friday from 8:00 am-12:00 pm.  Patients with office visits requiring labs will be seen before walk-in labs.  You may encounter longer than normal wait times. Please allow additional time. Wait times may be shorter on  Monday and Thursday afternoons.  We do not book appointments for walk-in labs. We appreciate your patience and understanding with our staff.   Labs are drawn by [facility]. Please bring your co-pay at the time of your lab draw.  You may receive a bill from [facility] for your lab work.  Please note if you are on Hydroxychloroquine and and an order has been placed for a Hydroxychloroquine level,  you will need to have it drawn 4 hours or more after your last dose.  If you wish to have your labs drawn at another location, please call the office 24 hours in advance so we can fax the orders.  The office is located at [address], [place], [place] [address]   If you have any questions regarding directions or hours of operation,  please call [phone].   As a reminder, please drink plenty of water prior to coming for your lab work. Thanks!   Neck Exercises Ask your health care provider which exercises are safe for you. Do exercises exactly as told by your health care provider and adjust them as directed. It is  normal to feel mild stretching, pulling, tightness, or discomfort as you do these exercises. Stop right away if you feel sudden pain or your pain gets worse. Do not begin these exercises until told by your health care provider. Neck exercises can be important for many reasons. They can improve strength and maintain flexibility in your neck, which will help your upper back and prevent neck pain. Stretching exercises Rotation neck stretching  Sit in a chair or stand up. Place your feet flat on the floor, shoulder-width apart. Slowly turn your head (rotate) to the right until a slight stretch is felt. Turn it all the way to the right so you can look over your right shoulder. Do not tilt or tip your head. Hold this position for 10-30 seconds. Slowly turn your head (rotate) to the left until a slight stretch is felt. Turn it all the way to the left so you can look over your left shoulder. Do not tilt or tip your head. Hold this position for 10-30 seconds. Repeat __________ times. Complete this exercise __________ times a day. Neck retraction  Sit in a sturdy chair or stand up. Look straight ahead. Do not bend your neck. Use your fingers to push your chin backward (retraction). Do not bend your neck for this movement. Continue to face straight ahead. If you are doing the exercise properly, you will feel a slight sensation in your throat and  a stretch at the back of your neck. Hold the stretch for 1-2 seconds. Repeat __________ times. Complete this exercise __________ times a day. Strengthening exercises Neck press  Lie on your back on a firm bed or on the floor with a pillow under your head. Use your neck muscles to push your head down on the pillow and straighten your spine. Hold the position as well as you can. Keep your head facing up (in a neutral position) and your chin tucked. Slowly count to 5 while holding this position. Repeat __________ times. Complete this exercise __________ times a  day. Isometrics These are exercises in which you strengthen the muscles in your neck while keeping your neck still (isometrics). Sit in a supportive chair and place your hand on your forehead. Keep your head and face facing straight ahead. Do not flex or extend your neck while doing isometrics. Push forward with your head and neck while pushing back with your hand. Hold for 10 seconds. Do the sequence again, this time putting your hand against the back of your head. Use your head and neck to push backward against the hand pressure. Finally, do the same exercise on either side of your head, pushing sideways against the pressure of your hand. Repeat __________ times. Complete this exercise __________ times a day. Prone head lifts  Lie face-down (prone position), resting on your elbows so that your chest and upper back are raised. Start with your head facing downward, near your chest. Position your chin either on or near your chest. Slowly lift your head upward. Lift until you are looking straight ahead. Then continue lifting your head as far back as you can comfortably stretch. Hold your head up for 5 seconds. Then slowly lower it to your starting position. Repeat __________ times. Complete this exercise __________ times a day. Supine head lifts  Lie on your back (supine position), bending your knees to point to the ceiling and keeping your feet flat on the floor. Lift your head slowly off the floor, raising your chin toward your chest. Hold for 5 seconds. Repeat __________ times. Complete this exercise __________ times a day. Scapular retraction  Stand with your arms at your sides. Look straight ahead. Slowly pull both shoulders (scapulae) backward and downward (retraction) until you feel a stretch between your shoulder blades in your upper back. Hold for 10-30 seconds. Relax and repeat. Repeat __________ times. Complete this exercise __________ times a day. Contact a health care provider  if: Your neck pain or discomfort gets worse when you do an exercise. Your neck pain or discomfort does not improve within 2 hours after you exercise. If you have any of these problems, stop exercising right away. Do not do the exercises again unless your health care provider says that you can. Get help right away if: You develop sudden, severe neck pain. If this happens, stop exercising right away. Do not do the exercises again unless your health care provider says that you can. This information is not intended to replace advice given to you by your health care provider. Make sure you discuss any questions you have with your health care provider. Document Revised: 09/13/2020 Document Reviewed: 09/13/2020 Elsevier Patient Education  2024 [employer].

## 2022-09-19 NOTE — Telephone Encounter (Signed)
Last Fill: 07/02/2022  Next Visit: 02/18/2023  Last Visit: 09/18/2022  Dx: Psoriasis   Current Dose per office note on 09/18/2022: dose not discussed  Okay to refill Clobetasol Cream?

## 2022-09-26 NOTE — Progress Notes (Signed)
C-spine consistent with multilevel spondylosis and facet joint arthropathy. Please notify the patient.   Referral to PT placed at her last office visit.

## 2022-10-02 DIAGNOSIS — H353221 Exudative age-related macular degeneration, left eye, with active choroidal neovascularization: Secondary | ICD-10-CM | POA: Diagnosis not present

## 2022-10-05 ENCOUNTER — Other Ambulatory Visit (HOSPITAL_COMMUNITY): Payer: Self-pay | Admitting: Neurosurgery

## 2022-10-05 DIAGNOSIS — D352 Benign neoplasm of pituitary gland: Secondary | ICD-10-CM

## 2022-10-09 ENCOUNTER — Ambulatory Visit (INDEPENDENT_AMBULATORY_CARE_PROVIDER_SITE_OTHER): Payer: Medicare Other

## 2022-10-09 DIAGNOSIS — E538 Deficiency of other specified B group vitamins: Secondary | ICD-10-CM | POA: Diagnosis not present

## 2022-10-09 MED ORDER — CYANOCOBALAMIN 1000 MCG/ML IJ SOLN
1000.00 ug | Freq: Once | INTRAMUSCULAR | Status: AC
Start: 2022-10-09 — End: 2022-10-09
  Administered 2022-10-09: 1000 ug via INTRAMUSCULAR

## 2022-10-09 NOTE — Progress Notes (Signed)
Per orders of Dr. Crawford Givens, injection of B12 given by Dorris Fetch in right deltoid. Patient tolerated injection well. Patient will make appointment for 1 month.

## 2022-10-14 ENCOUNTER — Other Ambulatory Visit: Payer: Self-pay | Admitting: Physician Assistant

## 2022-10-14 DIAGNOSIS — L4050 Arthropathic psoriasis, unspecified: Secondary | ICD-10-CM

## 2022-10-14 DIAGNOSIS — L409 Psoriasis, unspecified: Secondary | ICD-10-CM

## 2022-10-14 DIAGNOSIS — Z79899 Other long term (current) drug therapy: Secondary | ICD-10-CM

## 2022-10-15 ENCOUNTER — Ambulatory Visit (HOSPITAL_COMMUNITY)
Admission: RE | Admit: 2022-10-15 | Discharge: 2022-10-15 | Disposition: A | Discharge status: Home / Self Care | Payer: Medicare Other | Source: Ambulatory Visit | Attending: Neurosurgery | Admitting: Neurosurgery

## 2022-10-15 DIAGNOSIS — G319 Degenerative disease of nervous system, unspecified: Secondary | ICD-10-CM | POA: Diagnosis not present

## 2022-10-15 DIAGNOSIS — I63522 Cerebral infarction due to unspecified occlusion or stenosis of left anterior cerebral artery: Secondary | ICD-10-CM | POA: Diagnosis not present

## 2022-10-15 DIAGNOSIS — D352 Benign neoplasm of pituitary gland: Secondary | ICD-10-CM | POA: Insufficient documentation

## 2022-10-15 DIAGNOSIS — I6782 Cerebral ischemia: Secondary | ICD-10-CM | POA: Diagnosis not present

## 2022-10-15 MED ORDER — GADOBUTROL 1 MMOL/ML IV SOLN
10.0000 mL | Freq: Once | INTRAVENOUS | Status: AC | PRN
  Administered 2022-10-15: 10 mL via INTRAVENOUS

## 2022-10-15 NOTE — Telephone Encounter (Signed)
Last Fill: 07/16/2022  Labs: 07/16/2022 Glucose is 124. Rest of CMP WNL. Anemia is stable.   TB Gold: 04/19/2022   Next Visit: 02/18/2023  Last Visit: 09/18/2022  [id number] arthritis   Current Dose per office note 09/18/2022: Taltz 80 mg sq injections 28 days   Left message to advise patient he is due to update labs.   Okay to refill Taltz?

## 2022-10-17 NOTE — Progress Notes (Signed)
Co Sign needed for visit.

## 2022-10-22 ENCOUNTER — Telehealth: Payer: Self-pay | Admitting: Hematology and Oncology

## 2022-10-23 DIAGNOSIS — I5030 Unspecified diastolic (congestive) heart failure: Secondary | ICD-10-CM | POA: Diagnosis not present

## 2022-10-24 ENCOUNTER — Inpatient Hospital Stay: Payer: Medicare Other | Admitting: Hematology and Oncology

## 2022-10-31 ENCOUNTER — Other Ambulatory Visit: Payer: Self-pay

## 2022-10-31 ENCOUNTER — Encounter (INDEPENDENT_AMBULATORY_CARE_PROVIDER_SITE_OTHER): Payer: Self-pay

## 2022-10-31 DIAGNOSIS — L4050 Arthropathic psoriasis, unspecified: Secondary | ICD-10-CM

## 2022-10-31 DIAGNOSIS — L409 Psoriasis, unspecified: Secondary | ICD-10-CM

## 2022-10-31 DIAGNOSIS — Z79899 Other long term (current) drug therapy: Secondary | ICD-10-CM

## 2022-10-31 MED ORDER — TALTZ 80 MG/ML ~~LOC~~ SOAJ
SUBCUTANEOUS | 0 refills | Status: DC
Start: 2022-10-31 — End: 2023-01-14

## 2022-10-31 NOTE — Telephone Encounter (Signed)
Contacted the patient and advised his Altamease Oiler was sent in for three months. Patient verbalized understanding.

## 2022-10-31 NOTE — Telephone Encounter (Signed)
Last Fill: 07/16/2022   Labs: 07/16/2022 Glucose is 124. Rest of CMP WNL. Anemia is stable.    TB Gold: 04/19/2022    Next Visit: 02/18/2023   Last Visit: 09/18/2022   [id number] arthritis    Current Dose per office note 09/18/2022: Taltz 80 mg sq injections 28 days    Patient made aware he is due for labs and states he will go today to update them. Lab orders were released by Addie.    Okay to refill Taltz?

## 2022-10-31 NOTE — Telephone Encounter (Signed)
Patient contacted the office and states the refill of Taltz that was sent in was sent in for one month and not three so the pharmacy will not fill it. Advised the patient his labs are due. Patient states he will go get his labs done today and inquires if the prescription can be sent in for three months. Advised the patient I would release his labs and send a message to his doctor. Patient states he would like a phone call back regarding if the three months will be sent in. Patient states he is due for his shot this Sunday.

## 2022-11-05 ENCOUNTER — Ambulatory Visit: Payer: Medicare Other | Attending: Physician Assistant

## 2022-11-05 DIAGNOSIS — M542 Cervicalgia: Secondary | ICD-10-CM | POA: Insufficient documentation

## 2022-11-05 DIAGNOSIS — Z9181 History of falling: Secondary | ICD-10-CM | POA: Insufficient documentation

## 2022-11-05 DIAGNOSIS — R262 Difficulty in walking, not elsewhere classified: Secondary | ICD-10-CM | POA: Diagnosis not present

## 2022-11-05 DIAGNOSIS — R2681 Unsteadiness on feet: Secondary | ICD-10-CM | POA: Insufficient documentation

## 2022-11-05 DIAGNOSIS — M5432 Sciatica, left side: Secondary | ICD-10-CM | POA: Insufficient documentation

## 2022-11-05 DIAGNOSIS — R293 Abnormal posture: Secondary | ICD-10-CM | POA: Insufficient documentation

## 2022-11-05 DIAGNOSIS — M5459 Other low back pain: Secondary | ICD-10-CM | POA: Insufficient documentation

## 2022-11-05 DIAGNOSIS — D352 Benign neoplasm of pituitary gland: Secondary | ICD-10-CM | POA: Diagnosis not present
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
## 2022-11-06 DIAGNOSIS — H353221 Exudative age-related macular degeneration, left eye, with active choroidal neovascularization: Secondary | ICD-10-CM | POA: Diagnosis not present

## 2022-11-07 ENCOUNTER — Ambulatory Visit: Payer: Medicare Other

## 2022-11-07 DIAGNOSIS — R293 Abnormal posture: Secondary | ICD-10-CM | POA: Diagnosis not present

## 2022-11-07 DIAGNOSIS — Z79899 Other long term (current) drug therapy: Secondary | ICD-10-CM | POA: Diagnosis not present

## 2022-11-07 DIAGNOSIS — M5459 Other low back pain: Secondary | ICD-10-CM | POA: Diagnosis not present

## 2022-11-07 DIAGNOSIS — Z9181 History of falling: Secondary | ICD-10-CM | POA: Diagnosis not present

## 2022-11-07 DIAGNOSIS — M542 Cervicalgia: Secondary | ICD-10-CM | POA: Diagnosis not present

## 2022-11-07 DIAGNOSIS — R262 Difficulty in walking, not elsewhere classified: Secondary | ICD-10-CM | POA: Diagnosis not present

## 2022-11-07 DIAGNOSIS — R2681 Unsteadiness on feet: Secondary | ICD-10-CM | POA: Diagnosis not present
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
## 2022-11-08 NOTE — Progress Notes (Signed)
Creatinine is elevated at 1.56 most likely due to diuretic use and diabetes.  Please forward results to his PCP.  Alkaline phosphatase is elevated.  We will continue to monitor.  Hemoglobin is low at 10.9 and stable.

## 2022-11-12 ENCOUNTER — Ambulatory Visit: Payer: Medicare Other

## 2022-11-12 DIAGNOSIS — R293 Abnormal posture: Secondary | ICD-10-CM | POA: Diagnosis not present

## 2022-11-12 DIAGNOSIS — M542 Cervicalgia: Secondary | ICD-10-CM

## 2022-11-12 DIAGNOSIS — Z9181 History of falling: Secondary | ICD-10-CM | POA: Diagnosis not present

## 2022-11-12 DIAGNOSIS — M5459 Other low back pain: Secondary | ICD-10-CM | POA: Diagnosis not present

## 2022-11-12 DIAGNOSIS — R262 Difficulty in walking, not elsewhere classified: Secondary | ICD-10-CM | POA: Diagnosis not present

## 2022-11-12 DIAGNOSIS — R2681 Unsteadiness on feet: Secondary | ICD-10-CM | POA: Diagnosis not present
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
## 2022-11-13 ENCOUNTER — Ambulatory Visit (INDEPENDENT_AMBULATORY_CARE_PROVIDER_SITE_OTHER): Payer: Medicare Other

## 2022-11-13 ENCOUNTER — Ambulatory Visit: Payer: Medicare Other

## 2022-11-13 ENCOUNTER — Encounter: Payer: Self-pay | Admitting: Family Medicine

## 2022-11-13 DIAGNOSIS — E538 Deficiency of other specified B group vitamins: Secondary | ICD-10-CM

## 2022-11-13 DIAGNOSIS — Z961 Presence of intraocular lens: Secondary | ICD-10-CM | POA: Diagnosis not present

## 2022-11-13 DIAGNOSIS — E119 Type 2 diabetes mellitus without complications: Secondary | ICD-10-CM | POA: Diagnosis not present

## 2022-11-13 LAB — HM DIABETES EYE EXAM

## 2022-11-13 MED ORDER — CYANOCOBALAMIN 1000 MCG/ML IJ SOLN
1000.0000 ug | Freq: Once | INTRAMUSCULAR | Status: AC
Start: 2022-11-13 — End: 2022-11-13
  Administered 2022-11-13: 1000 ug via INTRAMUSCULAR

## 2022-11-13 NOTE — Progress Notes (Signed)
Per orders of Dr. Para March, injection of monthly B12 1000 mcg/ml given by Eual Fines, CMA in Left Deltoid. Patient tolerated injection well.  He is scheduling his NV for next month on the way out. I advised him to get his flu shot that day as well. He agreed.

## 2022-11-14 ENCOUNTER — Other Ambulatory Visit: Payer: Self-pay | Admitting: Family Medicine

## 2022-11-14 MED ORDER — METFORMIN HCL ER 750 MG PO TB24: 750.0000 mg | ORAL_TABLET | Freq: Two times a day (BID) | ORAL | 3 refills | Status: DC

## 2022-11-14 MED ORDER — TAMSULOSIN HCL 0.4 MG PO CAPS: 0.8000 mg | ORAL_CAPSULE | Freq: Every day | ORAL | 3 refills | Status: DC

## 2022-11-15 ENCOUNTER — Ambulatory Visit: Payer: Medicare Other

## 2022-11-15 ENCOUNTER — Encounter (INDEPENDENT_AMBULATORY_CARE_PROVIDER_SITE_OTHER): Payer: Self-pay

## 2022-11-15 DIAGNOSIS — R293 Abnormal posture: Secondary | ICD-10-CM

## 2022-11-15 DIAGNOSIS — R262 Difficulty in walking, not elsewhere classified: Secondary | ICD-10-CM | POA: Diagnosis not present

## 2022-11-15 DIAGNOSIS — R2681 Unsteadiness on feet: Secondary | ICD-10-CM | POA: Diagnosis not present

## 2022-11-15 DIAGNOSIS — M542 Cervicalgia: Secondary | ICD-10-CM

## 2022-11-15 DIAGNOSIS — Z9181 History of falling: Secondary | ICD-10-CM | POA: Diagnosis not present

## 2022-11-15 DIAGNOSIS — M5459 Other low back pain: Secondary | ICD-10-CM | POA: Diagnosis not present
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
## 2022-11-20 ENCOUNTER — Ambulatory Visit: Payer: Medicare Other

## 2022-11-20 DIAGNOSIS — R262 Difficulty in walking, not elsewhere classified: Secondary | ICD-10-CM | POA: Diagnosis not present

## 2022-11-20 DIAGNOSIS — R293 Abnormal posture: Secondary | ICD-10-CM | POA: Diagnosis not present

## 2022-11-20 DIAGNOSIS — M542 Cervicalgia: Secondary | ICD-10-CM | POA: Diagnosis not present

## 2022-11-20 DIAGNOSIS — R2681 Unsteadiness on feet: Secondary | ICD-10-CM | POA: Diagnosis not present

## 2022-11-20 DIAGNOSIS — M5459 Other low back pain: Secondary | ICD-10-CM | POA: Diagnosis not present

## 2022-11-20 DIAGNOSIS — Z9181 History of falling: Secondary | ICD-10-CM | POA: Diagnosis not present
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
## 2022-11-22 ENCOUNTER — Ambulatory Visit: Payer: Medicare Other

## 2022-11-22 DIAGNOSIS — R293 Abnormal posture: Secondary | ICD-10-CM

## 2022-11-22 DIAGNOSIS — R2681 Unsteadiness on feet: Secondary | ICD-10-CM | POA: Diagnosis not present

## 2022-11-22 DIAGNOSIS — M5459 Other low back pain: Secondary | ICD-10-CM | POA: Diagnosis not present

## 2022-11-22 DIAGNOSIS — R262 Difficulty in walking, not elsewhere classified: Secondary | ICD-10-CM | POA: Diagnosis not present

## 2022-11-22 DIAGNOSIS — M542 Cervicalgia: Secondary | ICD-10-CM | POA: Diagnosis not present

## 2022-11-22 DIAGNOSIS — Z9181 History of falling: Secondary | ICD-10-CM | POA: Diagnosis not present
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
## 2022-11-26 ENCOUNTER — Ambulatory Visit: Payer: Medicare Other

## 2022-11-26 DIAGNOSIS — R2681 Unsteadiness on feet: Secondary | ICD-10-CM | POA: Diagnosis not present

## 2022-11-26 DIAGNOSIS — M542 Cervicalgia: Secondary | ICD-10-CM | POA: Diagnosis not present

## 2022-11-26 DIAGNOSIS — M5459 Other low back pain: Secondary | ICD-10-CM | POA: Diagnosis not present

## 2022-11-26 DIAGNOSIS — Z9181 History of falling: Secondary | ICD-10-CM | POA: Diagnosis not present

## 2022-11-26 DIAGNOSIS — R262 Difficulty in walking, not elsewhere classified: Secondary | ICD-10-CM | POA: Diagnosis not present

## 2022-11-26 DIAGNOSIS — R293 Abnormal posture: Secondary | ICD-10-CM
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
## 2022-11-28 ENCOUNTER — Ambulatory Visit: Payer: Medicare Other

## 2022-11-28 DIAGNOSIS — R2681 Unsteadiness on feet: Secondary | ICD-10-CM | POA: Diagnosis not present

## 2022-11-28 DIAGNOSIS — M5459 Other low back pain: Secondary | ICD-10-CM | POA: Diagnosis not present

## 2022-11-28 DIAGNOSIS — R293 Abnormal posture: Secondary | ICD-10-CM | POA: Diagnosis not present

## 2022-11-28 DIAGNOSIS — M542 Cervicalgia: Secondary | ICD-10-CM | POA: Diagnosis not present

## 2022-11-28 DIAGNOSIS — R262 Difficulty in walking, not elsewhere classified: Secondary | ICD-10-CM | POA: Diagnosis not present

## 2022-11-28 DIAGNOSIS — Z9181 History of falling: Secondary | ICD-10-CM | POA: Diagnosis not present
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
## 2022-12-01 ENCOUNTER — Other Ambulatory Visit: Payer: Self-pay | Admitting: Family Medicine

## 2022-12-03 ENCOUNTER — Other Ambulatory Visit: Payer: Self-pay | Admitting: Family Medicine

## 2022-12-03 ENCOUNTER — Telehealth: Payer: Self-pay | Admitting: Family Medicine

## 2022-12-03 DIAGNOSIS — E1149 Type 2 diabetes mellitus with other diabetic neurological complication: Secondary | ICD-10-CM

## 2022-12-03 NOTE — Telephone Encounter (Signed)
Please call patient and set up a nonfasting lab visit.  I want to recheck his kidney function and his A1c based on his previous labs done at rheumatology.  I put in the orders.  Make sure he drinks plenty of fluid before coming into the lab visit.  Thanks.

## 2022-12-04 ENCOUNTER — Ambulatory Visit: Payer: Medicare Other

## 2022-12-04 NOTE — Telephone Encounter (Signed)
Lvm for patient tcb and schedule 

## 2022-12-06 ENCOUNTER — Ambulatory Visit: Payer: Medicare Other

## 2022-12-10 ENCOUNTER — Ambulatory Visit: Payer: Medicare Other | Attending: Physician Assistant

## 2022-12-10 DIAGNOSIS — M542 Cervicalgia: Secondary | ICD-10-CM | POA: Diagnosis not present

## 2022-12-10 DIAGNOSIS — R293 Abnormal posture: Secondary | ICD-10-CM
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
## 2022-12-11 ENCOUNTER — Other Ambulatory Visit (INDEPENDENT_AMBULATORY_CARE_PROVIDER_SITE_OTHER): Payer: Medicare Other

## 2022-12-11 DIAGNOSIS — E1149 Type 2 diabetes mellitus with other diabetic neurological complication: Secondary | ICD-10-CM

## 2022-12-11 LAB — BASIC METABOLIC PANEL
BUN: 23 mg/dL (ref 6–23)
CO2: 21 meq/L (ref 19–32)
Calcium: 9.4 mg/dL (ref 8.4–10.5)
Chloride: 113 meq/L — ABNORMAL HIGH (ref 96–112)
Creatinine, Ser: 1.18 mg/dL (ref 0.40–1.50)
GFR: 59.99 mL/min — ABNORMAL LOW (ref >60.00)
Glucose, Bld: 125 mg/dL — ABNORMAL HIGH (ref 70–99)
Potassium: 4.5 meq/L (ref 3.5–5.1)
Sodium: 142 meq/L (ref 135–145)

## 2022-12-11 LAB — HEMOGLOBIN A1C: Hgb A1c MFr Bld: 5.9 % (ref 4.6–6.5)

## 2022-12-12 ENCOUNTER — Encounter: Payer: Self-pay | Admitting: Physical Therapy

## 2022-12-12 ENCOUNTER — Ambulatory Visit: Payer: Medicare Other | Admitting: Physical Therapy

## 2022-12-12 DIAGNOSIS — M542 Cervicalgia: Secondary | ICD-10-CM

## 2022-12-12 DIAGNOSIS — R293 Abnormal posture: Secondary | ICD-10-CM | POA: Diagnosis not present
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
## 2022-12-13 ENCOUNTER — Ambulatory Visit (INDEPENDENT_AMBULATORY_CARE_PROVIDER_SITE_OTHER): Payer: Medicare Other

## 2022-12-13 DIAGNOSIS — Z23 Encounter for immunization: Secondary | ICD-10-CM

## 2022-12-13 DIAGNOSIS — E538 Deficiency of other specified B group vitamins: Secondary | ICD-10-CM | POA: Diagnosis not present

## 2022-12-13 MED ORDER — CYANOCOBALAMIN 1000 MCG/ML IJ SOLN
1000.0000 ug | Freq: Once | INTRAMUSCULAR | Status: AC
Start: 2022-12-13 — End: 2022-12-13
  Administered 2022-12-13: 1000 ug via INTRAMUSCULAR

## 2022-12-13 NOTE — Progress Notes (Signed)
Per orders of Dr. Crawford Givens, injection of B-12 given by Nanci Pina in right deltoid. Patient tolerated injection well.

## 2022-12-14 ENCOUNTER — Ambulatory Visit (INDEPENDENT_AMBULATORY_CARE_PROVIDER_SITE_OTHER): Payer: Medicare Other | Admitting: Family Medicine

## 2022-12-14 ENCOUNTER — Telehealth: Payer: Self-pay | Admitting: Family Medicine

## 2022-12-14 ENCOUNTER — Encounter: Payer: Self-pay | Admitting: Family Medicine

## 2022-12-14 VITALS — BP 130/84 | HR 83 | Temp 97.4°F | Ht 69.5 in | Wt 269.0 lb

## 2022-12-14 DIAGNOSIS — E1149 Type 2 diabetes mellitus with other diabetic neurological complication: Secondary | ICD-10-CM

## 2022-12-14 DIAGNOSIS — L723 Sebaceous cyst: Secondary | ICD-10-CM | POA: Diagnosis not present

## 2022-12-14 MED ORDER — ACCU-CHEK GUIDE VI STRP: ORAL_STRIP | 3 refills | Status: DC

## 2022-12-14 MED ORDER — PREGABALIN 25 MG PO CAPS: ORAL_CAPSULE | ORAL | 1 refills | Status: DC

## 2022-12-14 MED ORDER — GLIPIZIDE 5 MG PO TABS: ORAL_TABLET | ORAL | Status: DC

## 2022-12-14 NOTE — Telephone Encounter (Signed)
Patient needed new rx for test strips. Erx sent.

## 2022-12-14 NOTE — Telephone Encounter (Signed)
Please check with pharmacy about getting his rx for test strips filled.  [facility], [address] and [address].  Thanks.

## 2022-12-14 NOTE — Patient Instructions (Addendum)
Hold glipizide.   Try taking extra 25-50 mg lyrica at night.   Keep using warm compresses on the back of your neck.  Wash with soapy water.  Take care.  Glad to see you.  Yearly visit in Jan or Feb 2025.  Labs ahead of time if possible.

## 2022-12-14 NOTE — Progress Notes (Unsigned)
Diabetes:  Using medications without difficulties: yes Hypoglycemic episodes: occ sx, improved with a snack, cautions d/w pt.  Hyperglycemic episodes: no Feet problems: burning in the feet, no help with lyrica.  Pain is worse at night.   Blood Sugars averaging: not checked, he couldn't get his strips filled.   eye exam within last year: yes Labs d/w pt.  Cr improved.    B12 wnl on last check.    Seb cyst drained on the back of the neck.  Already covered.  Not red.  Not painful now.    Meds, vitals, and allergies reviewed.  ROS: Per HPI unless specifically indicated in ROS section   GEN: nad, alert and oriented HEENT: mucous membranes moist NECK: supple w/o LA CV: IRR PULM: ctab, no inc wob ABD: soft, +bs EXT: no edema SKIN: no acute rash Minimal residual sebaceous cyst on the back of the neck with open pore noted.  No fluctuant mass now and no spreading erythema.  Recovered with a Band-Aid.  Diabetic foot exam: Normal inspection No skin breakdown No calluses  Normal DP pulses Normal sensation to light touch and monofilament Nails normal 25 minutes were devoted to patient care in this encounter (this includes time spent reviewing the patient's file/history, interviewing and examining the patient, counseling/reviewing plan with patient).

## 2022-12-16 NOTE — Assessment & Plan Note (Signed)
Already draining.  No need to pack at this point.  Continue washing with soap and water and cover with a Band-Aid.  Update me as needed.

## 2022-12-16 NOTE — Assessment & Plan Note (Signed)
Hold glipizide for now given his A1c.  Recheck periodically.  Continue metformin.  Presumed diabetic neuropathy.  Still with sensation intact.  Increased Lyrica by 25 mg at night, up to 125 mg.  Then can increase to 150 mg at night if needed.  Continue 100 mg in the morning.  Update me as needed.

## 2022-12-18 DIAGNOSIS — H353114 Nonexudative age-related macular degeneration, right eye, advanced atrophic with subfoveal involvement: Secondary | ICD-10-CM | POA: Diagnosis not present

## 2022-12-18 DIAGNOSIS — H353221 Exudative age-related macular degeneration, left eye, with active choroidal neovascularization: Secondary | ICD-10-CM | POA: Diagnosis not present

## 2022-12-18 DIAGNOSIS — Z961 Presence of intraocular lens: Secondary | ICD-10-CM | POA: Diagnosis not present

## 2023-01-03 DIAGNOSIS — Z23 Encounter for immunization: Secondary | ICD-10-CM | POA: Diagnosis not present

## 2023-01-14 ENCOUNTER — Other Ambulatory Visit: Payer: Self-pay | Admitting: Rheumatology

## 2023-01-14 DIAGNOSIS — Z79899 Other long term (current) drug therapy: Secondary | ICD-10-CM

## 2023-01-14 DIAGNOSIS — L4050 Arthropathic psoriasis, unspecified: Secondary | ICD-10-CM

## 2023-01-14 DIAGNOSIS — L409 Psoriasis, unspecified: Secondary | ICD-10-CM

## 2023-01-14 NOTE — Telephone Encounter (Signed)
Last Fill: 10/31/2022  Labs: 11/07/2022 Creatinine is elevated at 1.56 most likely due to diuretic use and diabetes.    Alkaline phosphatase is elevated.  Hemoglobin is low at 10.9 and stable.   TB Gold: 04/19/2022 Neg    Next Visit: 02/18/2023  Last Visit: 09/18/2022  DX: Psoriatic arthritis   Current Dose per office note 09/18/2022: Taltz 80 mg sq injections 28 days   Okay to refill Taltz?

## 2023-01-15 ENCOUNTER — Ambulatory Visit (INDEPENDENT_AMBULATORY_CARE_PROVIDER_SITE_OTHER): Payer: Medicare Other

## 2023-01-15 DIAGNOSIS — E538 Deficiency of other specified B group vitamins: Secondary | ICD-10-CM

## 2023-01-15 MED ORDER — CYANOCOBALAMIN 1000 MCG/ML IJ SOLN
1000.0000 ug | Freq: Once | INTRAMUSCULAR | Status: AC
Start: 2023-01-15 — End: 2023-01-15
  Administered 2023-01-15: 1000 ug via INTRAMUSCULAR

## 2023-01-15 NOTE — Progress Notes (Signed)
Per orders of Dr. Crawford Givens, injection of B-12 given by Melina Copa in left deltoid. Patient tolerated injection well. Patient will make appointment for 1 month.

## 2023-01-24 ENCOUNTER — Other Ambulatory Visit: Payer: Self-pay

## 2023-01-24 ENCOUNTER — Encounter: Payer: Self-pay | Admitting: Emergency Medicine

## 2023-01-24 ENCOUNTER — Emergency Department
Admission: EM | Admit: 2023-01-24 | Discharge: 2023-01-24 | Disposition: A | Discharge status: Home / Self Care | Payer: Medicare Other | Attending: Emergency Medicine | Admitting: Emergency Medicine

## 2023-01-24 DIAGNOSIS — S80211A Abrasion, right knee, initial encounter: Secondary | ICD-10-CM | POA: Diagnosis not present

## 2023-01-24 DIAGNOSIS — Z23 Encounter for immunization: Secondary | ICD-10-CM | POA: Diagnosis not present

## 2023-01-24 DIAGNOSIS — S51012A Laceration without foreign body of left elbow, initial encounter: Secondary | ICD-10-CM | POA: Diagnosis not present

## 2023-01-24 DIAGNOSIS — S80212A Abrasion, left knee, initial encounter: Secondary | ICD-10-CM | POA: Insufficient documentation

## 2023-01-24 DIAGNOSIS — W19XXXA Unspecified fall, initial encounter: Secondary | ICD-10-CM

## 2023-01-24 DIAGNOSIS — W010XXA Fall on same level from slipping, tripping and stumbling without subsequent striking against object, initial encounter: Secondary | ICD-10-CM | POA: Diagnosis not present

## 2023-01-24 MED ORDER — LIDOCAINE-EPINEPHRINE 1 %-1:100000 IJ SOLN
20.0000 mL | Freq: Once | INTRAMUSCULAR | Status: AC
  Administered 2023-01-24: 20 mL
  Filled 2023-01-24: qty 1

## 2023-01-24 MED ORDER — TETANUS-DIPHTH-ACELL PERTUSSIS 5-2.5-18.5 LF-MCG/0.5 IM SUSY
0.5000 mL | PREFILLED_SYRINGE | Freq: Once | INTRAMUSCULAR | Status: AC
  Administered 2023-01-24: 0.5 mL via INTRAMUSCULAR
  Filled 2023-01-24: qty 0.5

## 2023-01-24 NOTE — Discharge Instructions (Signed)
Please keep your wound clean by washing at least daily with soap and water. Apply antibiotic ointment and a bandage If you see any signs of infection like spreading redness, pus coming from the wound, extreme pain, fevers, chills or any other worsening doctor right away or come back to the emergency department  See your doctor or come back to the emergency department in 7 to 10 days for suture removal.  Thank you for choosing [place] for your health care today!  Please see your primary doctor this week for a follow up appointment.   If you have any new, worsening, or unexpected symptoms call your doctor right away or come back to the emergency department for reevaluation.  It was my pleasure to care for you today.   Daneil Dan Modesto Charon, MD

## 2023-01-24 NOTE — ED Provider Triage Note (Signed)
Emergency Medicine Provider Triage Evaluation Note  [name] , a 77 y.o. male  was evaluated in triage.  Pt complains of fall where he landed on his right knee and left elbow. Did not hit head, no LOC. Does take blood thinners. Patient tripped on the stairs. Not UTD on tetanus.  Review of Systems  Positive: Knee and elbow pain Negative: Dizziness, lightheaded  Physical Exam  There were no vitals taken for this visit. Gen:   Awake, no distress   Resp:  Normal effort  MSK:   Moves extremities without difficulty  Other:  Left elbow tender and right knee tender. Abrasion to right knee, left elbow wrapped in guaze.  Medical Decision Making  Medically screening exam initiated at 5:39 PM.  Appropriate orders placed.  [name] was informed that the remainder of the evaluation will be completed by another provider, this initial triage assessment does not replace that evaluation, and the importance of remaining in the ED until their evaluation is complete.     Cameron Ali, PA-C 01/24/23 1742

## 2023-01-24 NOTE — ED Provider Notes (Signed)
[facility] Provider Note    Event Date/Time   First MD Initiated Contact with Patient 01/24/23 2135     (approximate)   History   Extremity Laceration   HPI  [name] is a 77 y.o. male   Past medical history of tubal medical comorbidities but most pertinently is on blood thinners for atrial fibrillation who presents with a elbow laceration after a mechanical trip and fall.  No head strike no loss of consciousness.  Also has abrasions to the knee.  Able to fully range all extremities.  Independent Historian contributed to assessment above: Wife at bedside corroborates information past medical history as above    Physical Exam   Triage Vital Signs: ED Triage Vitals  Encounter Vitals Group     BP 01/24/23 1739 135/70     Systolic BP Percentile --      Diastolic BP Percentile --      Pulse Rate 01/24/23 1739 (!) 56     Resp 01/24/23 1739 18     Temp 01/24/23 1739 97.8 F (36.6 C)     Temp Source 01/24/23 1739 Oral     SpO2 01/24/23 1739 93 %     Weight --      Height --      Head Circumference --      Peak Flow --      Pain Score 01/24/23 1737 3     Pain Loc --      Pain Education --      Exclude from Growth Chart --     Most recent vital signs: Vitals:   01/24/23 1739  BP: 135/70  Pulse: (!) 56  Resp: 18  Temp: 97.8 F (36.6 C)  SpO2: 93%    General: Awake, no distress.  CV:  Good peripheral perfusion.  Resp:  Normal effort.  Abd:  No distention.  Other:  Pleasant gentleman no acute distress normal vital signs.  5 cm gaping laceration to the left elbow and bilateral knee abrasions.  Neurovascular intact ranging fully in all extremities no signs of head trauma neck supple with full range of motion.   ED Results / Procedures / Treatments   Labs (all labs ordered are listed, but only abnormal results are displayed) Labs Reviewed - No data to display  PROCEDURES:  Critical Care performed: No  ..Laceration  Repair  Date/Time: 01/24/2023 11:09 PM  Performed by: Pilar Jarvis, MD Authorized by: Pilar Jarvis, MD   Consent:    Consent obtained:  Verbal   Consent given by:  Patient   Risks, benefits, and alternatives were discussed: yes     Risks discussed:  Infection, need for additional repair, nerve damage and poor wound healing   Alternatives discussed:  No treatment Universal protocol:    Procedure explained and questions answered to patient or proxy's satisfaction: yes     Patient identity confirmed:  Verbally with patient Anesthesia:    Anesthesia method:  Local infiltration   Local anesthetic:  Lidocaine 1% WITH epi Laceration details:    Location:  Shoulder/arm   Shoulder/arm location:  L elbow   Length (cm):  5   Depth (mm):  5 Exploration:    Limited defect created (wound extended): no     Hemostasis achieved with:  Direct pressure   Wound exploration: wound explored through full range of motion     Wound extent: no foreign body     Contaminated: no   Treatment:  Area cleansed with:  Soap and water   Amount of cleaning:  Standard   Irrigation solution:  Sterile water   Irrigation method:  Syringe   Debridement:  None   Undermining:  None Skin repair:    Repair method:  Sutures   Suture size:  4-0   Suture material:  Prolene   Suture technique:  Simple interrupted   Number of sutures:  6 Approximation:    Approximation:  Close Repair type:    Repair type:  Simple Post-procedure details:    Dressing:  Antibiotic ointment and non-adherent dressing   Procedure completion:  Tolerated    MEDICATIONS ORDERED IN ED: Medications  lidocaine-EPINEPHrine (XYLOCAINE W/EPI) 1 %-1:100000 (with pres) injection 20 mL (20 mLs Other Given 01/24/23 2144)  Tdap (BOOSTRIX) injection 0.5 mL (0.5 mLs Intramuscular Given 01/24/23 2143)     IMPRESSION / MDM / ASSESSMENT AND PLAN / ED COURSE  I reviewed the triage vital signs and the nursing notes.                                 Patient's presentation is most consistent with acute presentation with potential threat to life or bodily function.  Differential diagnosis includes, but is not limited to, fracture, dislocation, laceration   The patient is on the cardiac monitor to evaluate for evidence of arrhythmia and/or significant heart rate changes.  MDM:    Doubt fracture or dislocation given ability to fully range.  Laceration repaired as above.  Tetanus administered.  Discharge.        FINAL CLINICAL IMPRESSION(S) / ED DIAGNOSES   Final diagnoses:  Elbow laceration, left, initial encounter  Fall, initial encounter     Rx / DC Orders   ED Discharge Orders     None        Note:  This document was prepared using Dragon voice recognition software and may include unintentional dictation errors.    Pilar Jarvis, MD 01/24/23 [phone]

## 2023-01-24 NOTE — ED Triage Notes (Signed)
Pt had a trip and fall landing on left knee and left elbow. Laceration to left elbow.  Pt is on eliquis and was bleeding quite a bit.  Pressure dressing applied PTA and bleeding is controlled.
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
## 2023-02-05 ENCOUNTER — Ambulatory Visit (INDEPENDENT_AMBULATORY_CARE_PROVIDER_SITE_OTHER): Payer: Medicare Other | Admitting: Internal Medicine

## 2023-02-05 VITALS — BP 138/88 | HR 56 | Temp 99.2°F | Ht 69.5 in | Wt 268.0 lb

## 2023-02-05 DIAGNOSIS — S41112D Laceration without foreign body of left upper arm, subsequent encounter: Secondary | ICD-10-CM | POA: Diagnosis not present
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
## 2023-02-05 NOTE — Assessment & Plan Note (Signed)
Sutures removed without incident Wound is strong and well apposed---no steri strips needed

## 2023-02-12 DIAGNOSIS — H353221 Exudative age-related macular degeneration, left eye, with active choroidal neovascularization: Secondary | ICD-10-CM | POA: Diagnosis not present

## 2023-02-13 ENCOUNTER — Other Ambulatory Visit: Payer: Self-pay | Admitting: Family Medicine

## 2023-02-14 NOTE — Telephone Encounter (Signed)
Last office visit:12/14/22 Next office visit: Nothing scheduled  Last refill: 12/14/2022 PREGABALIN 100MG  CAPSULES 60 capsules 1 refill

## 2023-02-15 ENCOUNTER — Ambulatory Visit (INDEPENDENT_AMBULATORY_CARE_PROVIDER_SITE_OTHER): Payer: Medicare Other

## 2023-02-15 ENCOUNTER — Other Ambulatory Visit: Payer: Self-pay | Admitting: Family Medicine

## 2023-02-15 DIAGNOSIS — E538 Deficiency of other specified B group vitamins: Secondary | ICD-10-CM

## 2023-02-15 MED ORDER — ACCU-CHEK GUIDE TEST VI STRP: ORAL_STRIP | 12 refills | Status: DC

## 2023-02-15 MED ORDER — CYANOCOBALAMIN 1000 MCG/ML IJ SOLN
1000.0000 ug | Freq: Once | INTRAMUSCULAR | Status: AC
  Administered 2023-02-15: 1000 ug via INTRAMUSCULAR

## 2023-02-15 NOTE — Progress Notes (Signed)
Patient presented for B 12 injection given by Makinzy Cleere, CMA to right deltoid, patient voiced no concerns nor showed any signs of distress during injection.  

## 2023-02-18 ENCOUNTER — Encounter: Payer: Self-pay | Admitting: Physician Assistant

## 2023-02-18 ENCOUNTER — Ambulatory Visit: Payer: Medicare Other | Attending: Physician Assistant | Admitting: Physician Assistant

## 2023-02-18 VITALS — BP 117/73 | HR 52 | Resp 16 | Ht 69.5 in | Wt 262.0 lb

## 2023-02-18 DIAGNOSIS — Z111 Encounter for screening for respiratory tuberculosis: Secondary | ICD-10-CM | POA: Diagnosis not present

## 2023-02-18 DIAGNOSIS — M51369 Other intervertebral disc degeneration, lumbar region without mention of lumbar back pain or lower extremity pain: Secondary | ICD-10-CM | POA: Diagnosis not present

## 2023-02-18 DIAGNOSIS — G2581 Restless legs syndrome: Secondary | ICD-10-CM | POA: Insufficient documentation

## 2023-02-18 DIAGNOSIS — Z8669 Personal history of other diseases of the nervous system and sense organs: Secondary | ICD-10-CM | POA: Insufficient documentation

## 2023-02-18 DIAGNOSIS — Z96653 Presence of artificial knee joint, bilateral: Secondary | ICD-10-CM | POA: Diagnosis not present

## 2023-02-18 DIAGNOSIS — L4050 Arthropathic psoriasis, unspecified: Secondary | ICD-10-CM | POA: Insufficient documentation

## 2023-02-18 DIAGNOSIS — M19072 Primary osteoarthritis, left ankle and foot: Secondary | ICD-10-CM | POA: Insufficient documentation

## 2023-02-18 DIAGNOSIS — M19041 Primary osteoarthritis, right hand: Secondary | ICD-10-CM | POA: Diagnosis not present

## 2023-02-18 DIAGNOSIS — Z79899 Other long term (current) drug therapy: Secondary | ICD-10-CM | POA: Diagnosis not present

## 2023-02-18 DIAGNOSIS — L409 Psoriasis, unspecified: Secondary | ICD-10-CM | POA: Insufficient documentation

## 2023-02-18 DIAGNOSIS — Z7901 Long term (current) use of anticoagulants: Secondary | ICD-10-CM | POA: Insufficient documentation

## 2023-02-18 DIAGNOSIS — M722 Plantar fascial fibromatosis: Secondary | ICD-10-CM | POA: Insufficient documentation

## 2023-02-18 DIAGNOSIS — Z9181 History of falling: Secondary | ICD-10-CM | POA: Insufficient documentation

## 2023-02-18 DIAGNOSIS — M7662 Achilles tendinitis, left leg: Secondary | ICD-10-CM | POA: Insufficient documentation

## 2023-02-18 DIAGNOSIS — Z8679 Personal history of other diseases of the circulatory system: Secondary | ICD-10-CM | POA: Insufficient documentation

## 2023-02-18 DIAGNOSIS — M19042 Primary osteoarthritis, left hand: Secondary | ICD-10-CM | POA: Insufficient documentation

## 2023-02-18 DIAGNOSIS — Z87448 Personal history of other diseases of urinary system: Secondary | ICD-10-CM | POA: Insufficient documentation

## 2023-02-18 DIAGNOSIS — M19071 Primary osteoarthritis, right ankle and foot: Secondary | ICD-10-CM | POA: Diagnosis not present

## 2023-02-18 DIAGNOSIS — Z8639 Personal history of other endocrine, nutritional and metabolic disease: Secondary | ICD-10-CM | POA: Diagnosis not present

## 2023-02-18 DIAGNOSIS — I472 Ventricular tachycardia, unspecified: Secondary | ICD-10-CM | POA: Insufficient documentation

## 2023-02-18 NOTE — Patient Instructions (Signed)

## 2023-02-19 LAB — COMPLETE METABOLIC PANEL WITH GFR
AG Ratio: 1.1 (calc) (ref 1.0–2.5)
ALT: 15 U/L (ref 9–46)
AST: 12 U/L (ref 10–35)
Albumin: 3.9 g/dL (ref 3.6–5.1)
Alkaline phosphatase (APISO): 166 U/L — ABNORMAL HIGH (ref 35–144)
BUN/Creatinine Ratio: 24 (calc) — ABNORMAL HIGH (ref 6–22)
BUN: 45 mg/dL — ABNORMAL HIGH (ref 7–25)
CO2: 23 mmol/L (ref 20–32)
Calcium: 9.3 mg/dL (ref 8.6–10.3)
Chloride: 110 mmol/L (ref 98–110)
Creat: 1.86 mg/dL — ABNORMAL HIGH (ref 0.70–1.28)
Globulin: 3.6 g/dL (ref 1.9–3.7)
Glucose, Bld: 130 mg/dL — ABNORMAL HIGH (ref 65–99)
Potassium: 4.5 mmol/L (ref 3.5–5.3)
Sodium: 141 mmol/L (ref 135–146)
Total Bilirubin: 0.5 mg/dL (ref 0.2–1.2)
Total Protein: 7.5 g/dL (ref 6.1–8.1)
eGFR: 37 mL/min/{1.73_m2} — ABNORMAL LOW (ref >=60)

## 2023-02-19 LAB — CBC WITH DIFFERENTIAL/PLATELET
Absolute Lymphocytes: 1366 {cells}/uL (ref 850–3900)
Absolute Monocytes: 548 {cells}/uL (ref 200–950)
Basophils Absolute: 70 {cells}/uL (ref 0–200)
Basophils Relative: 0.8 %
Eosinophils Absolute: 392 {cells}/uL (ref 15–500)
Eosinophils Relative: 4.5 %
HCT: 31.6 % — ABNORMAL LOW (ref 38.5–50.0)
Hemoglobin: 10.4 g/dL — ABNORMAL LOW (ref 13.2–17.1)
MCH: 30.6 pg (ref 27.0–33.0)
MCHC: 32.9 g/dL (ref 32.0–36.0)
MCV: 92.9 fL (ref 80.0–100.0)
MPV: 10.6 fL (ref 7.5–12.5)
Monocytes Relative: 6.3 %
Neutro Abs: 6325 {cells}/uL (ref 1500–7800)
Neutrophils Relative %: 72.7 %
Platelets: 226 10*3/uL (ref 140–400)
RBC: 3.4 10*6/uL — ABNORMAL LOW (ref 4.20–5.80)
RDW: 12.7 % (ref 11.0–15.0)
Total Lymphocyte: 15.7 %
WBC: 8.7 10*3/uL (ref 3.8–10.8)

## 2023-02-19 LAB — SEDIMENTATION RATE: Sed Rate: 53 mm/h — ABNORMAL HIGH (ref 0–20)

## 2023-02-21 DIAGNOSIS — R252 Cramp and spasm: Secondary | ICD-10-CM | POA: Diagnosis not present

## 2023-02-21 DIAGNOSIS — M6281 Muscle weakness (generalized): Secondary | ICD-10-CM | POA: Diagnosis not present

## 2023-02-21 DIAGNOSIS — R2681 Unsteadiness on feet: Secondary | ICD-10-CM | POA: Diagnosis not present

## 2023-02-21 DIAGNOSIS — R42 Dizziness and giddiness: Secondary | ICD-10-CM | POA: Diagnosis not present

## 2023-02-21 DIAGNOSIS — G4733 Obstructive sleep apnea (adult) (pediatric): Secondary | ICD-10-CM | POA: Diagnosis not present

## 2023-02-21 DIAGNOSIS — F339 Major depressive disorder, recurrent, unspecified: Secondary | ICD-10-CM | POA: Diagnosis not present

## 2023-02-21 DIAGNOSIS — F411 Generalized anxiety disorder: Secondary | ICD-10-CM | POA: Diagnosis not present

## 2023-02-21 DIAGNOSIS — R296 Repeated falls: Secondary | ICD-10-CM | POA: Diagnosis not present

## 2023-02-21 DIAGNOSIS — Z820 Family history of epilepsy and other diseases of the nervous system: Secondary | ICD-10-CM | POA: Diagnosis not present

## 2023-02-26 DIAGNOSIS — R42 Dizziness and giddiness: Secondary | ICD-10-CM | POA: Diagnosis not present

## 2023-02-26 DIAGNOSIS — Z820 Family history of epilepsy and other diseases of the nervous system: Secondary | ICD-10-CM | POA: Diagnosis not present

## 2023-02-26 DIAGNOSIS — R252 Cramp and spasm: Secondary | ICD-10-CM | POA: Diagnosis not present

## 2023-02-26 DIAGNOSIS — G4733 Obstructive sleep apnea (adult) (pediatric): Secondary | ICD-10-CM | POA: Diagnosis not present

## 2023-02-26 DIAGNOSIS — F339 Major depressive disorder, recurrent, unspecified: Secondary | ICD-10-CM | POA: Diagnosis not present

## 2023-02-26 DIAGNOSIS — M6281 Muscle weakness (generalized): Secondary | ICD-10-CM | POA: Diagnosis not present

## 2023-02-26 DIAGNOSIS — R296 Repeated falls: Secondary | ICD-10-CM | POA: Diagnosis not present

## 2023-02-26 DIAGNOSIS — R2681 Unsteadiness on feet: Secondary | ICD-10-CM | POA: Diagnosis not present

## 2023-02-26 DIAGNOSIS — F411 Generalized anxiety disorder: Secondary | ICD-10-CM | POA: Diagnosis not present

## 2023-02-26 DIAGNOSIS — Z1379 Encounter for other screening for genetic and chromosomal anomalies: Secondary | ICD-10-CM | POA: Diagnosis not present

## 2023-03-02 ENCOUNTER — Telehealth: Payer: Self-pay | Admitting: Family Medicine

## 2023-03-02 DIAGNOSIS — R7989 Other specified abnormal findings of blood chemistry: Secondary | ICD-10-CM

## 2023-03-02 NOTE — Telephone Encounter (Signed)
Please check with patient.  Cr was worse than typical on most recent labs and I want to repeat at a nonfasting lab visit.  I put in the follow up orders.  Please see about getting him scheduled.  Thanks.

## 2023-03-04 NOTE — Telephone Encounter (Signed)
Left message to return call to our office.  

## 2023-03-06 NOTE — Telephone Encounter (Signed)
Called patient lab appointment has been scheduled. Will cal If any questions.

## 2023-03-06 NOTE — Telephone Encounter (Signed)
Noted. Thanks.

## 2023-03-12 ENCOUNTER — Other Ambulatory Visit (INDEPENDENT_AMBULATORY_CARE_PROVIDER_SITE_OTHER): Payer: Medicare Other

## 2023-03-12 DIAGNOSIS — R7989 Other specified abnormal findings of blood chemistry: Secondary | ICD-10-CM | POA: Diagnosis not present

## 2023-03-13 LAB — COMPREHENSIVE METABOLIC PANEL
ALT: 9 U/L (ref 0–53)
AST: 10 U/L (ref 0–37)
Albumin: 3.9 g/dL (ref 3.5–5.2)
Alkaline Phosphatase: 141 U/L — ABNORMAL HIGH (ref 39–117)
BUN: 32 mg/dL — ABNORMAL HIGH (ref 6–23)
CO2: 24 meq/L (ref 19–32)
Calcium: 9.2 mg/dL (ref 8.4–10.5)
Chloride: 107 meq/L (ref 96–112)
Creatinine, Ser: 1.4 mg/dL (ref 0.40–1.50)
GFR: 48.78 mL/min — ABNORMAL LOW (ref >60.00)
Glucose, Bld: 137 mg/dL — ABNORMAL HIGH (ref 70–99)
Potassium: 4.3 meq/L (ref 3.5–5.1)
Sodium: 140 meq/L (ref 135–145)
Total Bilirubin: 0.8 mg/dL (ref 0.2–1.2)
Total Protein: 7.3 g/dL (ref 6.0–8.3)

## 2023-03-19 ENCOUNTER — Ambulatory Visit: Payer: Medicare Other

## 2023-03-19 DIAGNOSIS — E538 Deficiency of other specified B group vitamins: Secondary | ICD-10-CM

## 2023-03-19 MED ORDER — CYANOCOBALAMIN 1000 MCG/ML IJ SOLN
1000.0000 ug | Freq: Once | INTRAMUSCULAR | Status: AC
  Administered 2023-03-19: 1000 ug via INTRAMUSCULAR

## 2023-03-19 NOTE — Progress Notes (Signed)
Per orders of Dr. Eustaquio Boyden, injection of B-12 given by Melina Copa in left deltoid. Patient tolerated injection well. Patient will make appointment for 1 month.

## 2023-03-23 ENCOUNTER — Other Ambulatory Visit: Payer: Self-pay | Admitting: Physician Assistant

## 2023-03-23 DIAGNOSIS — L4050 Arthropathic psoriasis, unspecified: Secondary | ICD-10-CM

## 2023-03-23 DIAGNOSIS — Z79899 Other long term (current) drug therapy: Secondary | ICD-10-CM

## 2023-03-23 DIAGNOSIS — L409 Psoriasis, unspecified: Secondary | ICD-10-CM

## 2023-03-25 NOTE — Telephone Encounter (Signed)
Last Fill: 01/14/2023  Labs: 02/18/2023 RBC  3.40, Hgb 10.4, Hct 31.6, Glucose 130, BUN 45, Creat. 1.86, GFR 37, BUN/Creat. Ratio 24, Alk. Phos. 166  TB Gold: 04/19/2022 Neg    Next Visit: 08/08/2023  Last Visit: 02/18/2023  DX: Psoriatic arthritis   Current Dose per office note 02/18/2023: Taltz 80 mg sq injections 28 days    Okay to refill Taltz?

## 2023-03-29 ENCOUNTER — Telehealth: Payer: Self-pay

## 2023-03-29 NOTE — Telephone Encounter (Signed)
Patient contacted the office and states that he has filled out his portion of the [place] paperwork for Taltz. Patient inquires if he needs to bring the paperwork to the office so that the other portion can be filled out. Please advise.

## 2023-03-29 NOTE — Telephone Encounter (Signed)
ATC patient to advise to return his completed application to clinic so that we may complete MD portion. Left VM  Chesley Mires, PharmD, MPH, BCPS, CPP Clinical Pharmacist (Rheumatology and Pulmonology)

## 2023-04-04 ENCOUNTER — Telehealth: Payer: Self-pay | Admitting: Pharmacist

## 2023-04-04 NOTE — Telephone Encounter (Signed)
 Received signed patient form for Taltz  PAP renewal through [employer]. Provider portion completed by Dr. Dolphus today  Submitted an URGENT Prior Authorization RENEWAL request to [employer] for TALTZ  via CoverMyMeds. Will update once we receive a response.  Key: [id number]

## 2023-04-10 NOTE — Telephone Encounter (Signed)
 Submitted an URGENT appeal to [facility] for TALTZ.  Case # [id number] Appeal fax: [phone] Phone: [phone]

## 2023-04-10 NOTE — Telephone Encounter (Signed)
 Received a fax regarding Prior Authorization from [employer] for TALTZ . Authorization has been DENIED because : (1) You need to try one (1) of these covered drugs: (a) Cosentyx (b) Skyrizi  (c) Stelara (2) OR your doctor needs to give us  specific medical reasons why one (1) of the covered drug(s) is not  Case # [id number] Appeal fax: [phone] Phone: [phone] Part D Appeal and Grievance Department [id number], [place] [id number] [place], [place] [address]  Sherry Pennant, PharmD, MPH, BCPS, CPP Clinical Pharmacist (Rheumatology and Pulmonology)

## 2023-04-12 ENCOUNTER — Other Ambulatory Visit (HOSPITAL_COMMUNITY): Payer: Self-pay

## 2023-04-12 NOTE — Telephone Encounter (Signed)
 Test claim for Taltz  processes with PA end date of 04/01/2024. Have yet to receive approval letter.  Submitted Patient Assistance RENEWAL Application to [facility] for TALTZ  along with provider portion, patient portion, test claim screenshot, medication list, insurance card copy. Will update patient when we receive a response.  Phone: [phone] Fax: [phone]

## 2023-04-15 NOTE — Telephone Encounter (Signed)
 Received a fax from  [facility] regarding an approval for TALTZ  patient assistance from 04/15/2023 to 04/01/2024. Approval letter sent to scan center.  Case [id number] Phone: [phone] Fax: [phone]  Sherry Pennant, PharmD, MPH, BCPS, CPP Clinical Pharmacist (Rheumatology and Pulmonology)

## 2023-04-18 ENCOUNTER — Ambulatory Visit: Payer: Medicare Other | Admitting: *Deleted

## 2023-04-18 DIAGNOSIS — E538 Deficiency of other specified B group vitamins: Secondary | ICD-10-CM | POA: Diagnosis not present

## 2023-04-18 MED ORDER — CYANOCOBALAMIN 1000 MCG/ML IJ SOLN
1000.0000 ug | Freq: Once | INTRAMUSCULAR | Status: AC
  Administered 2023-04-18: 1000 ug via INTRAMUSCULAR

## 2023-04-18 MED ORDER — CYANOCOBALAMIN 1000 MCG/ML IJ SOLN: 1000.0000 ug | Freq: Once | INTRAMUSCULAR | Status: DC

## 2023-04-18 NOTE — Progress Notes (Signed)
Per orders of Dr. Crawford Givens, injection of Vitamin B12 given by Lorel Monaco in right deltoid. Patient tolerated injection well.

## 2023-04-23 DIAGNOSIS — H353221 Exudative age-related macular degeneration, left eye, with active choroidal neovascularization: Secondary | ICD-10-CM | POA: Diagnosis not present

## 2023-04-26 DIAGNOSIS — I5030 Unspecified diastolic (congestive) heart failure: Secondary | ICD-10-CM | POA: Diagnosis not present

## 2023-05-17 ENCOUNTER — Encounter: Payer: Self-pay | Admitting: Family Medicine

## 2023-05-17 ENCOUNTER — Ambulatory Visit (INDEPENDENT_AMBULATORY_CARE_PROVIDER_SITE_OTHER): Payer: Medicare Other | Admitting: Family Medicine

## 2023-05-17 VITALS — BP 124/76 | HR 62 | Temp 97.8°F | Ht 69.5 in | Wt 261.4 lb

## 2023-05-17 DIAGNOSIS — I509 Heart failure, unspecified: Secondary | ICD-10-CM | POA: Diagnosis not present

## 2023-05-17 DIAGNOSIS — E1149 Type 2 diabetes mellitus with other diabetic neurological complication: Secondary | ICD-10-CM

## 2023-05-17 DIAGNOSIS — E538 Deficiency of other specified B group vitamins: Secondary | ICD-10-CM | POA: Diagnosis not present

## 2023-05-17 DIAGNOSIS — E785 Hyperlipidemia, unspecified: Secondary | ICD-10-CM | POA: Diagnosis not present

## 2023-05-17 MED ORDER — LISINOPRIL 40 MG PO TABS: 40.0000 mg | ORAL_TABLET | Freq: Every day | ORAL | Status: DC

## 2023-05-17 MED ORDER — ROPINIROLE HCL 0.5 MG PO TABS: 1.0000 mg | ORAL_TABLET | Freq: Every day | ORAL | Status: DC

## 2023-05-17 MED ORDER — CYANOCOBALAMIN 1000 MCG/ML IJ SOLN
1000.0000 ug | Freq: Once | INTRAMUSCULAR | Status: AC
  Administered 2023-05-17: 1000 ug via INTRAMUSCULAR

## 2023-05-17 NOTE — Progress Notes (Signed)
Per orders of Dr. Crawford Givens, injection of B-12 given by Leonor Liv in left deltoid. Patient tolerated injection well. Patient will make appointment for 1 month.

## 2023-05-17 NOTE — Progress Notes (Signed)
DM2. Max sugar 150s.  Off metformin in the meantime.  No DM2 meds.  Discussed options.  He has been having a salty metallic taste in his mouth.  He has neuropathic pain up to the knees, with a burning sensation.  He is having joint aches, hips and wrists.  He also has patchy skin irritation.  After he gets up in the morning takes his Lyrica some of the burning pain gets better.  He still has aching and fatigue throughout the day.  He has less hand strength compared to prior.  He is profoundly and understandably frustrated with his vision changes.  He cannot read as well as he used to.  Discussed options with audiobooks or potentially getting an application on his phone that would audibly read printed text.  We also talked about getting his cardiology care established locally.  Discussed that I have all confidence in the doctors in the [facility] in [place].  Referral placed.  Med list updated.  History of B12 deficiency.  B12 dose given at office visit.  He usually feels better for about 1 week after a dose of B12.  Meds, vitals, and allergies reviewed.   ROS: Per HPI unless specifically indicated in ROS section   GEN: nad, alert and oriented HEENT: ncat NECK: supple w/o LA CV: rrr.  PULM: ctab, no inc wob ABD: soft, +bs EXT: trace BLE SKIN: Well-perfused.

## 2023-05-17 NOTE — Patient Instructions (Signed)
B12 shot today.  Stop pravastatin for now.  Update me in about 10 days.  Let me know how you feel.   Take care.  Glad to see you.

## 2023-05-20 NOTE — Assessment & Plan Note (Signed)
He has diffuse aches.  He has neuropathy, psoriatic and osteoarthritis, and is currently on a statin.  I think it makes sense to stop his statin and take a dose of B12 today, done at office visit.  He can update me about how he is feeling in about 10 days.  If he notes a significant change off statin, then we can adjust his medications at that point.  It is unclear how much of his symptoms are statin related versus related to neuropathy/multiple forms of arthritis.  Discussed.  At this point still okay for outpatient follow-up.  32 minutes were devoted to patient care in this encounter (this includes time spent reviewing the patient's file/history, interviewing and examining the patient, counseling/reviewing plan with patient).

## 2023-05-20 NOTE — Assessment & Plan Note (Signed)
Not on metformin or sulfonylurea currently.  Sugars reasonable for now.  See hyperlipidemia discussion.

## 2023-05-20 NOTE — Assessment & Plan Note (Signed)
B12 dose given at office visit.  Continue with replacement as is.

## 2023-05-20 NOTE — Assessment & Plan Note (Signed)
History of, desires local cardiac care.  Refer to cardiology in [place].  I appreciate the help of all involved.

## 2023-05-21 ENCOUNTER — Ambulatory Visit: Payer: Medicare Other

## 2023-06-02 ENCOUNTER — Other Ambulatory Visit: Payer: Self-pay | Admitting: Family Medicine

## 2023-06-04 NOTE — Telephone Encounter (Signed)
 Last refill: ROPINIROLE 1MG  TABLETS 05/17/23 Last office visit: 05/17/23 Next office visit: nothing scheduled

## 2023-06-05 NOTE — Telephone Encounter (Signed)
 Notify pt.  I sent the rx for 1mg  tabs so he wouldn't have to take two of the 0.5mg  tabs.  Thanks.

## 2023-06-09 ENCOUNTER — Telehealth: Payer: Self-pay | Admitting: Family Medicine

## 2023-06-09 NOTE — Telephone Encounter (Signed)
 Please update patient- they will get a letter/mychart message about this.    The hospital system discovered a software error in a system at the laboratory at [facility] at [address]. in [place] that examines kidney function. The patient's results were incorrectly calculated on this test.  The test is called uACR. It measures the amount of albumin (a protein) in the urine relative to creatinine (a waste product). This test is one of several factors used to judge kidney function. The laboratory software miscalculated the ratio of one to the other. The measurements themselves were correct. The software error has been fixed.  I went back and checked the patient's lab results and medicines.    At this point, it does not appear that the patient needs to make any medicine changes.   If the patient has any questions or concerns about this, please schedule a visit/let me know.   Thanks.

## 2023-06-11 NOTE — Telephone Encounter (Signed)
 Patient notified

## 2023-06-18 ENCOUNTER — Ambulatory Visit

## 2023-06-19 ENCOUNTER — Ambulatory Visit (INDEPENDENT_AMBULATORY_CARE_PROVIDER_SITE_OTHER)

## 2023-06-19 DIAGNOSIS — E538 Deficiency of other specified B group vitamins: Secondary | ICD-10-CM

## 2023-06-19 MED ORDER — CYANOCOBALAMIN 1000 MCG/ML IJ SOLN
1000.0000 ug | Freq: Once | INTRAMUSCULAR | Status: AC
  Administered 2023-06-19: 1000 ug via INTRAMUSCULAR

## 2023-06-19 NOTE — Progress Notes (Signed)
 Per orders of Dr. Para March, injection of Monthly B12 1000 mcg/ml given by Eual Fines, CMA in Right Deltoid Patient tolerated injection well.  Sending to Dr Sharen Hones in Dr Lianne Bushy absence.

## 2023-06-29 ENCOUNTER — Other Ambulatory Visit: Payer: Self-pay | Admitting: Rheumatology

## 2023-06-29 DIAGNOSIS — L409 Psoriasis, unspecified: Secondary | ICD-10-CM

## 2023-06-29 DIAGNOSIS — Z79899 Other long term (current) drug therapy: Secondary | ICD-10-CM

## 2023-06-29 DIAGNOSIS — L4050 Arthropathic psoriasis, unspecified: Secondary | ICD-10-CM

## 2023-07-01 NOTE — Addendum Note (Signed)
 Addended by: Henriette Combs on: 07/01/2023 09:25 AM   Modules accepted: Orders

## 2023-07-01 NOTE — Telephone Encounter (Signed)
 Last Fill: 03/25/2023  Labs: 02/18/2023 RBC 3.40, Hgb 10.4, Hct 31.6, Glucose 130, BUN 45, Creat. 1.86, GFR 37, BUN/Creat. Ratio 24, Alk. Phos. 166   TB Gold: 04/19/2022 Neg    Next Visit: 08/08/2023  Last Visit: 02/18/2023  GN:[id number] arthritis   Current Dose per office note 02/18/2023:  Taltz 80 mg sq injections 28 days   Attempted to contact patient and left message to advise patient he is due to update labs.   Okay to refill Taltz?

## 2023-07-08 ENCOUNTER — Other Ambulatory Visit: Payer: Self-pay | Admitting: Psychiatry

## 2023-07-08 ENCOUNTER — Other Ambulatory Visit: Payer: Self-pay

## 2023-07-08 DIAGNOSIS — F4310 Post-traumatic stress disorder, unspecified: Secondary | ICD-10-CM

## 2023-07-08 DIAGNOSIS — F331 Major depressive disorder, recurrent, moderate: Secondary | ICD-10-CM

## 2023-07-08 MED ORDER — ACCU-CHEK GUIDE TEST VI STRP: ORAL_STRIP | 12 refills | Status: AC

## 2023-07-15 DIAGNOSIS — H353221 Exudative age-related macular degeneration, left eye, with active choroidal neovascularization: Secondary | ICD-10-CM | POA: Diagnosis not present

## 2023-07-17 ENCOUNTER — Other Ambulatory Visit: Payer: Self-pay | Admitting: *Deleted

## 2023-07-17 ENCOUNTER — Encounter: Payer: Self-pay | Admitting: Psychiatry

## 2023-07-17 ENCOUNTER — Telehealth: Payer: Self-pay | Admitting: *Deleted

## 2023-07-17 ENCOUNTER — Ambulatory Visit (INDEPENDENT_AMBULATORY_CARE_PROVIDER_SITE_OTHER): Payer: Medicare Other | Admitting: Psychiatry

## 2023-07-17 DIAGNOSIS — F3342 Major depressive disorder, recurrent, in full remission: Secondary | ICD-10-CM

## 2023-07-17 DIAGNOSIS — Z111 Encounter for screening for respiratory tuberculosis: Secondary | ICD-10-CM | POA: Diagnosis not present

## 2023-07-17 DIAGNOSIS — F4310 Post-traumatic stress disorder, unspecified: Secondary | ICD-10-CM

## 2023-07-17 DIAGNOSIS — Z79899 Other long term (current) drug therapy: Secondary | ICD-10-CM

## 2023-07-17 DIAGNOSIS — F331 Major depressive disorder, recurrent, moderate: Secondary | ICD-10-CM | POA: Diagnosis not present

## 2023-07-17 MED ORDER — FLUOXETINE HCL 20 MG PO CAPS: 40.0000 mg | ORAL_CAPSULE | Freq: Every day | ORAL | 3 refills | Status: DC

## 2023-07-17 NOTE — Progress Notes (Signed)
 [name] [id number] September 22, 1945 78 y.o.    Subjective:   Patient ID:  [name] is a 78 y.o. (DOB September 27, 1945) male.  Chief Complaint:  Chief Complaint  Patient presents with   Follow-up    [name] presents to the office today for follow-up of depression and anxiety.  He has been under my care for many years, 44.  On Prozac all those years.    Last seen Dec 2020 without med changes and the folllowing noted: Served custody papers with F trying to get GD.  Left to live with her F in July.  Depressing time for me but handled it.  Had a supportive friends which helped.  Read Ace Abu Nowen's book which helped.  Ok with it.  She's 78 yo.  Still loves them and sees them.  03/22/20 appt with following noted: Last half of the year not good.  Early June lost dear friend unexpectedly.  Late 02-06-24 only brother died.  Noticed more depression after brother's death.   Wonders about increased fluoxetine from 30 without SE.  Tended to lay in bed longer than he should.  Less motivation and energy and enjoyment. Mild irritability. 78 yo GD doing well in driving and school. Plan: Agree to increase fluoxetine to 40 mg daily.  05/26/20 appt noted: Saw benefit with anxiety after increase fluoxetine and feels better physically.  No SE. Better interest and enjoyment with things.  Not sig depressed now.  More productive.  W noticed he's better. Cardiology said he was boring.  Lost 20# last year.   B was last sibling.  12/21/20 appt noted: GD moved back in with them and F giving up custody.  F agreed for her to move back.  With added stress continuing this dose is desired.  Going well with GD.  She's in counseling.  She's doing well with school. Less irritable with increase fluoxetine to 40 mg daily and wife notices.  Patient reports stable mood and denies depressed or irritable moods.  Patient denies any recent difficulty with anxiety.  Patient denies difficulty with sleep initiation or maintenance. RLS  managed with ropinirole.  Denies appetite disturbance.  Patient reports that energy and motivation have been good.  Patient denies any difficulty with concentration.  Patient denies any suicidal ideation.  07/16/22 appt noted: Missed appt since her.  Got date mixed up.  Same day found wife in floor having SZ.  Remote history of stroke.   Been doing ok overall. Continues fluoxetine 40 mg daily.  Previous note says increase helped mood and iritabliity.   A year ago 07/07/23 D died unexpectedly.   D was not a good mother.  Therefore they got custody of GD.  GD reacted strongly to this.  GD was bullied in public school but completed HS online.   Sleep 10-12 hours at a time. Using bipap and working. Health most affected by psoriatic arthritis.  Multiple medical problems.  07/17/23 appt noted: Med: fluox 40, ropinirole 1 mg PM, Lyrica 100 BID for neuropathy.   I think ok but times of dep and irritable but partly DT wet MD and can't see well.  Cannot read nor drive.  Wife still drives.   Neuropathy and arthritic pain always.  No RLS bc controlled.  No longer moving legs at night.   Content to be at home.  Frustrated can't seel  .  Signed up for blind services with books on tape.  Plans to do that and looks forward to  it.   Sleep 8+ hours.  Anxiety managed.  Not sig dep except down with vision. Satisfied with meds.   No SE with fluoxetine.    Feels so much better after ablation for VT.   Marland KitchenPast Psychiatric Medication Trials: Zoloft, fluoxetine 30 Trazodone,  He has been under my care for many years, 59.  On Prozac all those years.    Review of Systems:  Review of Systems  Eyes:  Positive for visual disturbance.  Cardiovascular:  Negative for palpitations.  Musculoskeletal:  Positive for arthralgias and gait problem.  Neurological:  Positive for weakness. Negative for tremors and numbness.  Psychiatric/Behavioral:  Positive for dysphoric mood. Negative for agitation, behavioral problems,  confusion, decreased concentration, hallucinations, self-injury, sleep disturbance and suicidal ideas. The patient is not nervous/anxious and is not hyperactive.     Medications: I have reviewed the patient's current medications.  Current Outpatient Medications  Medication Sig Dispense Refill   Accu-Chek FastClix Lancets MISC Use as directed to check blood sugars. Dx E11.9 102 each 3   amLODipine (NORVASC) 5 MG tablet Take 5 mg by mouth daily.     Blood Glucose Calibration (ACCU-CHEK GUIDE CONTROL) LIQD USE AS DIRECTED 1 each 0   Blood Glucose Monitoring Suppl (ACCU-CHEK GUIDE) w/Device KIT 1 kit by In Vitro route daily. 1 kit 0   cetirizine (ZYRTEC) 10 MG tablet Take 10 mg by mouth at bedtime.     Cholecalciferol (VITAMIN D3) 5000 UNITS TABS Take 5,000 Units by mouth every morning.     clobetasol cream (TEMOVATE) 0.05 % APPLY TOPICALLY TO THE AFFECTED AREA TWICE DAILY 30 g 0   cyanocobalamin (,VITAMIN B-12,) 1000 MCG/ML injection Inject 1 mL (1,000 mcg total) into the muscle every 30 (thirty) days. 1 mL    ELIQUIS 5 MG TABS tablet Take 1 tablet (5 mg total) by mouth 2 (two) times daily. 180 tablet 3   fluticasone (FLONASE) 50 MCG/ACT nasal spray Place 2 sprays into both nostrils daily as needed for rhinitis.      glucose blood (ACCU-CHEK GUIDE TEST) test strip Use as instructed to check sugar daily.  E11.9. 100 each 12   ixekizumab (TALTZ) 80 MG/ML pen INJECT 80MG  (1 PEN) UNDER THE SKIN EVERY 28 DAYS 1 mL 0   lisinopril (ZESTRIL) 40 MG tablet Take 1 tablet (40 mg total) by mouth daily.     MELATONIN GUMMIES PO Take 2 tablets by mouth at bedtime. CHEW     Multiple Vitamins-Minerals (PRESERVISION AREDS 2) CAPS Take 1 capsule by mouth 2 (two) times daily.     pregabalin (LYRICA) 100 MG capsule TAKE 1 CAPSULE BY MOUTH TWICE DAILY 180 capsule 1   rOPINIRole (REQUIP) 1 MG tablet TAKE 1 TABLET(1 MG) BY MOUTH AT BEDTIME 90 tablet 3   tamsulosin (FLOMAX) 0.4 MG CAPS capsule Take 2 capsules (0.8 mg  total) by mouth daily. 180 capsule 3   TORSEMIDE PO Take 30 mg by mouth daily.     FLUoxetine (PROZAC) 20 MG capsule Take 2 capsules (40 mg total) by mouth daily. 180 capsule 3   No current facility-administered medications for this visit.    Medication Side Effects: None  Allergies:  Allergies  Allergen Reactions   Cheese Anaphylaxis    Bacteria in aged cheeses cause Anaphylactic reaction Patient can tolerate cheese that is not aged, such as ricotta, cream cheese and cottage cheese   Verapamil Shortness Of Breath and Other (See Comments)    CP, irregular/slow HR, dizziness, heartburn, drowsiness, weakness  Zithromax [Azithromycin Dihydrate] Swelling    Swelling (arms/legs/scrotum)   Oxycodone Itching and Other (See Comments)    Sweats and itching- tolerates hydrocodone   Acyclovir And Related Other (See Comments)    Told by MD   Amiodarone Other (See Comments)    Acute lung toxicity   Arava [Leflunomide]     Lack of effect   Latex Itching   Methotrexate Derivatives     Intolerant- chills, aches, diarrhea.     Adhesive [Tape] Itching and Rash   Amlodipine Other (See Comments)    Muscle pain    Past Medical History:  Diagnosis Date   Allergic rhinitis    Anticoagulant long-term use    eliquis   Anxiety    Arthritis    WRISTS, KNEES, ANKLES   CAD (coronary artery disease) CARDIOLOGIST-  DR GREGG TAYLOR   Nonobstructive CAD by cath 2006;  HEART CATH AGAIN ON 06/08/13 AFTER CHEST DISCOMFORT / ADMISSION TO [facility] - "MILD NON-OBSTRUCTIVE CAD, NORMAL LV SYSTOLIC FUNCTION"   Depression    Diastolic CHF (HCC) dx 06/2011--- cardiologist-  dr Sharlot Gowda taylor   EF 50-55%  per echo 05/2016   GERD (gastroesophageal reflux disease)    H/O cardiac radiofrequency ablation    History of kidney stones    HTN (hypertension)    Hyperlipidemia    OSA treated with BiPAP followed dr Vassie Loll (previously dr clance)   per study 02-04-2012 very severe osa AHI 90/hr--  currently uses Bi-Pap every night  per pt    Paroxysmal VT [facility]) cardiologist--  dr Sharlot Gowda taylor   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 Corliss Skains   PTSD (post-traumatic stress disorder)    Restless leg syndrome    Stroke (HCC)    Type 2 diabetes mellitus (HCC)    Vertigo    dx by Dr. Para March per patient     Family History  Problem Relation Age of Onset   Cancer Mother        benign tumor, died from surgery complications   Other Mother        pituitary adenoma   Sudden death Father 2   Heart disease Father        MI at 71   Heart disease Brother        stents and PPM   Parkinson's disease Brother    Heart disease Brother    Cancer Brother        multiple myeloma   Sudden death Paternal Grandmother 30   Heart disease Maternal Uncle    Sudden death Paternal Uncle 50   Heart disease Paternal Uncle    COPD Paternal Uncle    Colon cancer Neg Hx    Prostate cancer Neg Hx     Social History   Socioeconomic History   Marital status: Married    Spouse name: Not on file   Number of children: 1   Years of education: Not on file   Highest education level: Master's degree (e.g., MA, MS, MEng, MEd, MSW, MBA)  Occupational History   Occupation: retired    Comment: Geophysicist/field seismologist  Tobacco Use   Smoking status:  Former    Current packs/day: 0.00    Average packs/day: 3.0 packs/day for 2.0 years (6.0 ttl pk-yrs)    Types: Cigarettes    Start date: 08/22/1978    Quit date: 08/21/1980    Years since quitting: 42.9    Passive exposure: Never   Smokeless tobacco: Never  Vaping Use   Vaping status: Never Used  Substance and Sexual Activity   Alcohol use: Yes    Alcohol/week: 0.0 standard drinks of alcohol     Comment: occasional   Drug use: No   Sexual activity: Not Currently  Other Topics Concern   Not on file  Social History Narrative   Married 1971   [employer] grad   1 daughter   Pt's granddaughter lives with patient   Retired as Geophysicist/field seismologist and nonprofit/financial work with [employer] Pulmonary (10/23/16):   Originally from [facility] [facility]. Previously lived in [place] shortly. Previously was a [employer]. He worked mostly with non-profit groups. Does have a cat currently. Remote bird exposure. No hot tub or mold exposure. Remote travel to [place], [place], [place]    Social Drivers of Corporate investment banker Strain: Low Risk  (05/15/2023)   Overall Financial Resource Strain (CARDIA)    Difficulty of Paying Living Expenses: Not very hard  Food Insecurity: Food Insecurity Present (05/15/2023)   Hunger Vital Sign    Worried About Running Out of Food in the Last Year: Sometimes true    Ran Out of Food in the Last Year: Never true  Transportation Needs: Unknown (05/15/2023)   PRAPARE - Transportation    Lack of Transportation (Medical): No    Lack of Transportation (Non-Medical): Patient declined  Physical Activity: Inactive (05/15/2023)   Exercise Vital Sign    Days of Exercise per Week: 0 days    Minutes of Exercise per Session: 0 min  Stress: No Stress Concern Present (05/15/2023)   [employer] of Occupational Health - Occupational Stress Questionnaire    Feeling of Stress : Only a little  Social Connections: Socially Integrated (05/15/2023)   Social Connection and Isolation Panel [NHANES]    Frequency of Communication with Friends and Family: More than three times a week    Frequency of Social Gatherings with Friends and Family: Once a week    Attends Religious Services: More than 4 times per year    Active Member of [employer] or Organizations: Yes    Attends Banker Meetings: More than 4 times per year    Marital Status: Married  Careers information officer Violence: Not At Risk (08/02/2022)   Humiliation, Afraid, Rape, and Kick questionnaire    Fear of Current or Ex-Partner: No    Emotionally Abused: No    Physically Abused: No    Sexually Abused: No    Past Medical History, Surgical history, Social history, and Family history were reviewed and updated as appropriate.   Please see review of systems for further details on the patient's review from today.   Objective:   Physical Exam:  There were no vitals taken for this visit.  Physical Exam Constitutional:      Appearance: Normal appearance. He is obese.  Neurological:     Mental Status: He is alert and oriented to person, place, and time.     Cranial Nerves: No dysarthria.     Motor: No weakness.     Gait: Gait abnormal.  Psychiatric:  Attention and Perception: Attention normal. He does not perceive auditory or visual hallucinations.        Mood and Affect: Mood is not anxious or depressed.        Speech: Speech normal. Speech is not slurred.        Behavior: Behavior is not slowed. Behavior is cooperative.        Thought Content: Thought content normal. Thought content is not paranoid or delusional. Thought content does not include homicidal or suicidal ideation. Thought content does not include suicidal plan.        Cognition and Memory: Cognition and memory normal.        Judgment: Judgment normal.     Comments: Good insight and judment     Lab Review:     Component Value Date/Time   NA 140 03/12/2023 1419   NA 144 11/07/2022 1424   NA 139 09/03/2014 1039   K 4.3 03/12/2023 1419   K 4.3 09/03/2014 1039   CL 107 03/12/2023 1419   CO2 24 03/12/2023 1419   CO2 23 09/03/2014 1039   GLUCOSE 137 (H) 03/12/2023 1419   GLUCOSE 156 (H) 09/03/2014 1039   BUN 32 (H) 03/12/2023 1419   BUN 31 (H) 11/07/2022 1424   BUN 23.0 09/03/2014 1039   CREATININE 1.40 03/12/2023 1419   CREATININE 1.86 (H) 02/18/2023 1319   CREATININE 1.0 09/03/2014 1039   CALCIUM 9.2  03/12/2023 1419   CALCIUM 9.3 09/03/2014 1039   PROT 7.3 03/12/2023 1419   PROT 7.2 11/07/2022 1424   PROT 7.5 09/03/2014 1039   ALBUMIN 3.9 03/12/2023 1419   ALBUMIN 4.2 11/07/2022 1424   ALBUMIN 3.5 09/03/2014 1039   AST 10 03/12/2023 1419   AST 15 09/03/2014 1039   ALT 9 03/12/2023 1419   ALT 14 09/03/2014 1039   ALKPHOS 141 (H) 03/12/2023 1419   ALKPHOS 125 09/03/2014 1039   BILITOT 0.8 03/12/2023 1419   BILITOT 0.6 11/07/2022 1424   BILITOT 1.10 09/03/2014 1039   GFRNONAA 66 02/11/2020 1441   GFRAA 77 02/11/2020 1441       Component Value Date/Time   WBC 8.7 02/18/2023 1319   RBC 3.40 (L) 02/18/2023 1319   HGB 10.4 (L) 02/18/2023 1319   HGB 10.9 (L) 11/07/2022 1424   HGB 12.3 (L) 09/03/2014 1037   HCT 31.6 (L) 02/18/2023 1319   HCT 32.2 (L) 11/07/2022 1424   HCT 36.1 (L) 09/03/2014 1037   PLT 226 02/18/2023 1319   PLT 196 11/07/2022 1424   MCV 92.9 02/18/2023 1319   MCV 92 11/07/2022 1424   MCV 88.6 09/03/2014 1037   MCH 30.6 02/18/2023 1319   MCHC 32.9 02/18/2023 1319   RDW 12.7 02/18/2023 1319   RDW 13.1 11/07/2022 1424   RDW 13.9 09/03/2014 1037   LYMPHSABS 1.2 11/07/2022 1424   LYMPHSABS 1.2 09/03/2014 1037   MONOABS 0.5 04/25/2022 1450   MONOABS 0.4 09/03/2014 1037   EOSABS 392 02/18/2023 1319   EOSABS 0.3 11/07/2022 1424   BASOSABS 70 02/18/2023 1319   BASOSABS 0.1 11/07/2022 1424   BASOSABS 0.1 09/03/2014 1037    No results found for: "POCLITH", "LITHIUM"   No results found for: "PHENYTOIN", "PHENOBARB", "VALPROATE", "CBMZ"   .res Assessment: Plan:    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 We discussed  hgistory of longterm fluoxetine.  Supportive therapy over situation with GD & death of her mother  and pt's D.    Encourage activity. And exercise.  continiue fluoxetine to 40 mg daily. More even keeled after the increase.  Mood and anxiety managed.  Satisfied with meds.    Doing fine without sleep meds.  Disc long duration of sleep but feels better in bed DT arthritis.  Disc incr risk bleeding on Eloquis.  FU 12 mos  Nori Beat, MD, DFAPA   Please see After Visit Summary for patient specific instructions.  Future Appointments  Date Time Provider Department Center  07/18/2023  2:00 PM [facility] CLINICAL SUPPORT [facility]  08/06/2023  1:00 PM [facility] ANNUAL WELLNESS VISIT 1 [facility]  08/08/2023  1:40 PM Nicholas Bari, MD [facility] None  08/19/2023  2:00 PM Gollan, Deadra Everts, MD [facility] None    No orders of the defined types were placed in this encounter.     -------------------------------

## 2023-07-17 NOTE — Addendum Note (Signed)
 Addended by: Adrianne Horn on: 07/17/2023 01:57 PM   Modules accepted: Orders

## 2023-07-17 NOTE — Telephone Encounter (Signed)
 Patient come to the office for labs. Patient was insistent he be given a sample of Taltz. Patient advised we would be unable to provide him with a sample until after his labs have resulted. Patient advised he was overdue for his CBC, CMP and TB Gold. Patient advised we would need the results prior to providing a sample. Patient states he is due for his injection on Saturday. Patient advised that he is due for labs every 3 months. Patient states "I have never been told this before. This is the first I am hearing this." Patient advised we do review this at every visit and we also place the reminder in his after visit summary. Patient advised to set a reminder on his calendar that he will be due for labs again July 2025. Patient advised we did send a prescription to the pharmacy for a 30 day supply on 07/01/2023. Patient states they would not send it out due to it being for only 1 pen and not a 90 day supply. Patient advised we would not send a 90 day supply with him being overdue for labs. Patient advised we can send the 90 day supply once results  have been received. Patient was not happy with this decision.

## 2023-07-18 ENCOUNTER — Ambulatory Visit

## 2023-07-19 NOTE — Progress Notes (Signed)
 TB gold negative

## 2023-07-23 ENCOUNTER — Ambulatory Visit (INDEPENDENT_AMBULATORY_CARE_PROVIDER_SITE_OTHER)

## 2023-07-23 ENCOUNTER — Other Ambulatory Visit: Payer: Self-pay | Admitting: *Deleted

## 2023-07-23 DIAGNOSIS — E538 Deficiency of other specified B group vitamins: Secondary | ICD-10-CM

## 2023-07-23 DIAGNOSIS — L409 Psoriasis, unspecified: Secondary | ICD-10-CM

## 2023-07-23 DIAGNOSIS — Z79899 Other long term (current) drug therapy: Secondary | ICD-10-CM

## 2023-07-23 DIAGNOSIS — L4050 Arthropathic psoriasis, unspecified: Secondary | ICD-10-CM

## 2023-07-23 LAB — COMPREHENSIVE METABOLIC PANEL WITH GFR
AG Ratio: 1.3 (calc) (ref 1.0–2.5)
ALT: 14 U/L (ref 9–46)
AST: 10 U/L (ref 10–35)
Albumin: 4 g/dL (ref 3.6–5.1)
Alkaline phosphatase (APISO): 172 U/L — ABNORMAL HIGH (ref 35–144)
BUN/Creatinine Ratio: 16 (calc) (ref 6–22)
BUN: 27 mg/dL — ABNORMAL HIGH (ref 7–25)
CO2: 25 mmol/L (ref 20–32)
Calcium: 9.1 mg/dL (ref 8.6–10.3)
Chloride: 105 mmol/L (ref 98–110)
Creat: 1.69 mg/dL — ABNORMAL HIGH (ref 0.70–1.28)
Globulin: 3.2 g/dL (ref 1.9–3.7)
Glucose, Bld: 124 mg/dL — ABNORMAL HIGH (ref 65–99)
Potassium: 4.1 mmol/L (ref 3.5–5.3)
Sodium: 139 mmol/L (ref 135–146)
Total Bilirubin: 0.8 mg/dL (ref 0.2–1.2)
Total Protein: 7.2 g/dL (ref 6.1–8.1)
eGFR: 42 mL/min/{1.73_m2} — ABNORMAL LOW (ref >=60)

## 2023-07-23 LAB — QUANTIFERON-TB GOLD PLUS
Mitogen-NIL: 1.99 [IU]/mL
NIL: 0.01 [IU]/mL
QuantiFERON-TB Gold Plus: NEGATIVE
TB1-NIL: 0 [IU]/mL
TB2-NIL: 0 [IU]/mL

## 2023-07-23 LAB — TEST AUTHORIZATION

## 2023-07-23 LAB — CBC WITH DIFFERENTIAL/PLATELET
Absolute Lymphocytes: 1209 {cells}/uL (ref 850–3900)
Absolute Monocytes: 462 {cells}/uL (ref 200–950)
Basophils Absolute: 59 {cells}/uL (ref 0–200)
Basophils Relative: 0.9 %
Eosinophils Absolute: 247 {cells}/uL (ref 15–500)
Eosinophils Relative: 3.8 %
HCT: 29.2 % — ABNORMAL LOW (ref 38.5–50.0)
Hemoglobin: 10 g/dL — ABNORMAL LOW (ref 13.2–17.1)
MCH: 31.5 pg (ref 27.0–33.0)
MCHC: 34.2 g/dL (ref 32.0–36.0)
MCV: 92.1 fL (ref 80.0–100.0)
MPV: 10.9 fL (ref 7.5–12.5)
Monocytes Relative: 7.1 %
Neutro Abs: 4524 {cells}/uL (ref 1500–7800)
Neutrophils Relative %: 69.6 %
Platelets: 207 10*3/uL (ref 140–400)
RBC: 3.17 10*6/uL — ABNORMAL LOW (ref 4.20–5.80)
RDW: 12.9 % (ref 11.0–15.0)
Total Lymphocyte: 18.6 %
WBC: 6.5 10*3/uL (ref 3.8–10.8)

## 2023-07-23 LAB — ALKALINE PHOSPHATASE ISOENZYMES
Alkaline phosphatase (APISO): 172 U/L — ABNORMAL HIGH (ref 35–144)
Bone Isoenzymes: 51 % (ref 28–66)
Intestinal Isoenzymes: 0 % — ABNORMAL LOW (ref 1–24)
Liver Isoenzymes: 49 % (ref 25–69)
Macrohepatic isoenzymes: 0 % (ref <=0)
Placental isoenzymes: 0 % (ref <=0)

## 2023-07-23 MED ORDER — CYANOCOBALAMIN 1000 MCG/ML IJ SOLN
1000.0000 ug | Freq: Once | INTRAMUSCULAR | Status: AC
  Administered 2023-07-23: 1000 ug via INTRAMUSCULAR

## 2023-07-23 MED ORDER — TALTZ 80 MG/ML ~~LOC~~ SOAJ: SUBCUTANEOUS | 0 refills | Status: DC

## 2023-07-23 NOTE — Telephone Encounter (Signed)
 Last Fill: 07/01/2023  Labs: 07/17/2023 Creatinine is elevated-1.69 and GFR is low-42.   RBC count, hgb, and hct are low but sable. Could be related to CKD.   TB Gold: 07/17/2023   Next Visit: 08/08/2023  Last Visit: 02/18/2023  DX: Psoriatic arthritis   Current Dose per office note 02/18/2023: Taltz  80 mg sq injections 28 days   Okay to refill Taltz ?

## 2023-07-23 NOTE — Progress Notes (Signed)
Per orders of Dr. [name], injection of B-12 given by Francella Solian in left deltoid. Patient tolerated injection well. Patient will make appointment for 1 month.

## 2023-07-24 NOTE — Progress Notes (Signed)
 Alk phos remains elevated.  No elevation of isoenzymes.

## 2023-07-29 ENCOUNTER — Encounter: Payer: Self-pay | Admitting: *Deleted

## 2023-08-06 ENCOUNTER — Ambulatory Visit (INDEPENDENT_AMBULATORY_CARE_PROVIDER_SITE_OTHER): Payer: Medicare Other

## 2023-08-06 VITALS — Ht 69.5 in | Wt 261.0 lb

## 2023-08-06 DIAGNOSIS — Z0000 Encounter for general adult medical examination without abnormal findings: Secondary | ICD-10-CM | POA: Diagnosis not present

## 2023-08-06 DIAGNOSIS — E1149 Type 2 diabetes mellitus with other diabetic neurological complication: Secondary | ICD-10-CM

## 2023-08-06 DIAGNOSIS — E785 Hyperlipidemia, unspecified: Secondary | ICD-10-CM
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
## 2023-08-06 NOTE — Patient Instructions (Signed)
 [name] , Thank you for taking time to come for your Medicare Wellness Visit. I appreciate your ongoing commitment to your health goals. Please review the following plan we discussed and let me know if I can assist you in the future.   Referrals/Orders/Follow-Ups/Clinician Recommendations: none  This is a list of the screening recommended for you and due dates:  Health Maintenance  Topic Date Due   Zoster (Shingles) Vaccine (1 of 2) Never done   Yearly kidney health urinalysis for diabetes  04/06/2023   Hemoglobin A1C  06/10/2023   Flu Shot  11/01/2023   Eye exam for diabetics  11/13/2023   Complete foot exam   12/14/2023   Yearly kidney function blood test for diabetes  07/16/2024   Medicare Annual Wellness Visit  08/05/2024   DTaP/Tdap/Td vaccine (2 - Td or Tdap) 01/23/2033   Pneumonia Vaccine  Completed   Hepatitis C Screening  Completed   HPV Vaccine  Aged Out   Meningitis B Vaccine  Aged Out   COVID-19 Vaccine  Discontinued   Cologuard (Stool DNA test)  Discontinued    Advanced directives: (In Chart) A copy of your advanced directives are scanned into your chart should your provider ever need it.  Next Medicare Annual Wellness Visit scheduled for next year: Yes  Have you seen your provider in the last 6 months (3 months if uncontrolled diabetes)? Yes

## 2023-08-08 ENCOUNTER — Encounter: Payer: Self-pay | Admitting: Rheumatology

## 2023-08-08 ENCOUNTER — Ambulatory Visit: Payer: Medicare Other | Attending: Rheumatology | Admitting: Rheumatology

## 2023-08-08 VITALS — BP 135/80 | HR 56 | Resp 16 | Ht 69.5 in | Wt 266.4 lb

## 2023-08-08 DIAGNOSIS — M19042 Primary osteoarthritis, left hand: Secondary | ICD-10-CM | POA: Insufficient documentation

## 2023-08-08 DIAGNOSIS — Z8679 Personal history of other diseases of the circulatory system: Secondary | ICD-10-CM | POA: Diagnosis not present

## 2023-08-08 DIAGNOSIS — G2581 Restless legs syndrome: Secondary | ICD-10-CM

## 2023-08-08 DIAGNOSIS — L409 Psoriasis, unspecified: Secondary | ICD-10-CM | POA: Diagnosis not present

## 2023-08-08 DIAGNOSIS — I472 Ventricular tachycardia, unspecified: Secondary | ICD-10-CM

## 2023-08-08 DIAGNOSIS — M19072 Primary osteoarthritis, left ankle and foot: Secondary | ICD-10-CM | POA: Diagnosis not present

## 2023-08-08 DIAGNOSIS — L4050 Arthropathic psoriasis, unspecified: Secondary | ICD-10-CM | POA: Diagnosis not present

## 2023-08-08 DIAGNOSIS — Z7901 Long term (current) use of anticoagulants: Secondary | ICD-10-CM | POA: Diagnosis not present

## 2023-08-08 DIAGNOSIS — M51369 Other intervertebral disc degeneration, lumbar region without mention of lumbar back pain or lower extremity pain: Secondary | ICD-10-CM | POA: Diagnosis not present

## 2023-08-08 DIAGNOSIS — Z79899 Other long term (current) drug therapy: Secondary | ICD-10-CM

## 2023-08-08 DIAGNOSIS — Z87448 Personal history of other diseases of urinary system: Secondary | ICD-10-CM | POA: Diagnosis not present

## 2023-08-08 DIAGNOSIS — Z96653 Presence of artificial knee joint, bilateral: Secondary | ICD-10-CM

## 2023-08-08 DIAGNOSIS — M19041 Primary osteoarthritis, right hand: Secondary | ICD-10-CM | POA: Diagnosis not present

## 2023-08-08 DIAGNOSIS — Z8639 Personal history of other endocrine, nutritional and metabolic disease: Secondary | ICD-10-CM

## 2023-08-08 DIAGNOSIS — Z9181 History of falling: Secondary | ICD-10-CM | POA: Diagnosis not present

## 2023-08-08 DIAGNOSIS — M19071 Primary osteoarthritis, right ankle and foot: Secondary | ICD-10-CM

## 2023-08-08 DIAGNOSIS — Z8669 Personal history of other diseases of the nervous system and sense organs: Secondary | ICD-10-CM

## 2023-08-08 DIAGNOSIS — M722 Plantar fascial fibromatosis: Secondary | ICD-10-CM

## 2023-08-08 DIAGNOSIS — M7662 Achilles tendinitis, left leg: Secondary | ICD-10-CM

## 2023-08-08 NOTE — Patient Instructions (Addendum)
 Standing Labs We placed an order today for your standing lab work.   Please have your standing labs drawn in July and every 3 months  Please have your labs drawn 2 weeks prior to your appointment so that the provider can discuss your lab results at your appointment, if possible.  Please note that you may see your imaging and lab results in MyChart before we have reviewed them. We will contact you once all results are reviewed. Please allow our office up to 72 hours to thoroughly review all of the results before contacting the office for clarification of your results.  WALK-IN LAB HOURS  Monday through Thursday from 8:00 am -12:30 pm and 1:00 pm-5:00 pm and Friday from 8:00 am-12:00 pm.  Patients with office visits requiring labs will be seen before walk-in labs.  You may encounter longer than normal wait times. Please allow additional time. Wait times may be shorter on  Monday and Thursday afternoons.  We do not book appointments for walk-in labs. We appreciate your patience and understanding with our staff.   Labs are drawn by [facility]. Please bring your co-pay at the time of your lab draw.  You may receive a bill from [facility] for your lab work.  Please note if you are on Hydroxychloroquine and and an order has been placed for a Hydroxychloroquine level,  you will need to have it drawn 4 hours or more after your last dose.  If you wish to have your labs drawn at another location, please call the office 24 hours in advance so we can fax the orders.  The office is located at [address], [address], [place], [place] [address]   If you have any questions regarding directions or hours of operation,  please call [phone].   As a reminder, please drink plenty of water  prior to coming for your lab work. Thanks!   Vaccines You are taking a medication(s) that can suppress your immune system.  The following immunizations are recommended: Flu annually Covid-19  Td/Tdap (tetanus, diphtheria,  pertussis) every 10 years Pneumonia (Prevnar 15 then Pneumovax 23 at least 1 year apart.  Alternatively, can take Prevnar 20 without needing additional dose) Shingrix: 2 doses from 4 weeks to 6 months apart  Please check with your PCP to make sure you are up to date.   If you have signs or symptoms of an infection or start antibiotics: First, call your PCP for workup of your infection. Hold your medication through the infection, until you complete your antibiotics, and until symptoms resolve if you take the following: Injectable medication (Actemra, Benlysta, Cimzia, Cosentyx, Enbrel, Humira, Kevzara, Orencia, Remicade, Simponi, Stelara, Taltz , Tremfya) Methotrexate  Leflunomide  (Arava ) Mycophenolate (Cellcept) Cloria Danger, Olumiant, or Rinvoq  Hand Exercises Hand exercises can be helpful for almost anyone. They can strengthen your hands and improve flexibility and movement. The exercises can also increase blood flow to the hands. These results can make your work and daily tasks easier for you. Hand exercises can be especially helpful for people who have joint pain from arthritis or nerve damage from using their hands over and over. These exercises can also help people who injure a hand. Exercises Most of these hand exercises are gentle stretching and motion exercises. It is usually safe to do them often throughout the day. Warming up your hands before exercise may help reduce stiffness. You can do this with gentle massage or by placing your hands in warm water  for 10-15 minutes. It is normal to feel some  stretching, pulling, tightness, or mild discomfort when you begin new exercises. In time, this will improve. Remember to always be careful and stop right away if you feel sudden, very bad pain or your pain gets worse. You want to get better and be safe. Ask your health care provider which exercises are safe for you. Do exercises exactly as told by your provider and adjust them as told. Do not begin  these exercises until told by your provider. Knuckle bend or "claw" fist  Stand or sit with your arm, hand, and all five fingers pointed straight up. Make sure to keep your wrist straight. Gently bend your fingers down toward your palm until the tips of your fingers are touching your palm. Keep your big knuckle straight and only bend the small knuckles in your fingers. Hold this position for 10 seconds. Straighten your fingers back to your starting position. Repeat this exercise 5-10 times with each hand. Full finger fist  Stand or sit with your arm, hand, and all five fingers pointed straight up. Make sure to keep your wrist straight. Gently bend your fingers into your palm until the tips of your fingers are touching the middle of your palm. Hold this position for 10 seconds. Extend your fingers back to your starting position, stretching every joint fully. Repeat this exercise 5-10 times with each hand. Straight fist  Stand or sit with your arm, hand, and all five fingers pointed straight up. Make sure to keep your wrist straight. Gently bend your fingers at the big knuckle, where your fingers meet your hand, and at the middle knuckle. Keep the knuckle at the tips of your fingers straight and try to touch the bottom of your palm. Hold this position for 10 seconds. Extend your fingers back to your starting position, stretching every joint fully. Repeat this exercise 5-10 times with each hand. Tabletop  Stand or sit with your arm, hand, and all five fingers pointed straight up. Make sure to keep your wrist straight. Gently bend your fingers at the big knuckle, where your fingers meet your hand, as far down as you can. Keep the small knuckles in your fingers straight. Think of forming a tabletop with your fingers. Hold this position for 10 seconds. Extend your fingers back to your starting position, stretching every joint fully. Repeat this exercise 5-10 times with each hand. Finger  spread  Place your hand flat on a table with your palm facing down. Make sure your wrist stays straight. Spread your fingers and thumb apart from each other as far as you can until you feel a gentle stretch. Hold this position for 10 seconds. Bring your fingers and thumb tight together again. Hold this position for 10 seconds. Repeat this exercise 5-10 times with each hand. Making circles  Stand or sit with your arm, hand, and all five fingers pointed straight up. Make sure to keep your wrist straight. Make a circle by touching the tip of your thumb to the tip of your index finger. Hold for 10 seconds. Then open your hand wide. Repeat this motion with your thumb and each of your fingers. Repeat this exercise 5-10 times with each hand. Thumb motion  Sit with your forearm resting on a table and your wrist straight. Your thumb should be facing up toward the ceiling. Keep your fingers relaxed as you move your thumb. Lift your thumb up as high as you can toward the ceiling. Hold for 10 seconds. Bend your thumb across your palm as  far as you can, reaching the tip of your thumb for the small finger (pinkie) side of your palm. Hold for 10 seconds. Repeat this exercise 5-10 times with each hand. Grip strengthening  Hold a stress ball or other soft ball in the middle of your hand. Slowly increase the pressure, squeezing the ball as much as you can without causing pain. Think of bringing the tips of your fingers into the middle of your palm. All of your finger joints should bend when doing this exercise. Hold your squeeze for 10 seconds, then relax. Repeat this exercise 5-10 times with each hand. Contact a health care provider if: Your hand pain or discomfort gets much worse when you do an exercise. Your hand pain or discomfort does not improve within 2 hours after you exercise. If you have either of these problems, stop doing these exercises right away. Do not do them again unless your provider  says that you can. Get help right away if: You develop sudden, severe hand pain or swelling. If this happens, stop doing these exercises right away. Do not do them again unless your provider says that you can. This information is not intended to replace advice given to you by your health care provider. Make sure you discuss any questions you have with your health care provider. Document Revised: 04/03/2022 Document Reviewed: 04/03/2022 Elsevier Patient Education  2024 [employer].

## 2023-08-08 NOTE — Progress Notes (Signed)
 Medication Samples have been provided to the patient.  Drug name: Taltz        Strength: 80 mg        Qty: 1  LOT: [id number]  Exp.Date: 10/09/2024  Dosing instructions: Inject one pen into skin every 28 days.
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
## 2023-08-19 ENCOUNTER — Ambulatory Visit: Payer: Medicare Other | Attending: Cardiovascular Disease | Admitting: Cardiovascular Disease

## 2023-08-19 VITALS — BP 120/60 | HR 57 | Ht 69.5 in | Wt 273.5 lb

## 2023-08-19 DIAGNOSIS — I10 Essential (primary) hypertension: Secondary | ICD-10-CM

## 2023-08-19 DIAGNOSIS — I480 Paroxysmal atrial fibrillation: Secondary | ICD-10-CM | POA: Diagnosis not present

## 2023-08-19 DIAGNOSIS — I509 Heart failure, unspecified: Secondary | ICD-10-CM

## 2023-08-19 DIAGNOSIS — I2510 Atherosclerotic heart disease of native coronary artery without angina pectoris: Secondary | ICD-10-CM | POA: Diagnosis not present

## 2023-08-19 NOTE — Patient Instructions (Addendum)
 Medication Instructions:  No changes  If you need a refill on your cardiac medications before your next appointment, please call your pharmacy.    Lab work: No new labs needed   Testing/Procedures: No new testing needed   Follow-Up: At [facility], you and your health needs are our priority.  As part of our continuing mission to provide you with exceptional heart care, we have created designated Provider Care Teams.  These Care Teams include your primary Cardiologist (physician) and Advanced Practice Providers (APPs -  Physician Assistants and Nurse Practitioners) who all work together to provide you with the care you need, when you need it.  You will need a follow up appointment in 6 months  Providers on your designated Care Team:   Nicolasa Ducking, NP Eula Listen, PA-C Cadence Fransico [place], [place]  COVID-19 Vaccine Information can be found at: [email or web address] For questions related to vaccine distribution or appointments, please email [email or web address] or call [phone].

## 2023-08-22 ENCOUNTER — Ambulatory Visit (INDEPENDENT_AMBULATORY_CARE_PROVIDER_SITE_OTHER)

## 2023-08-22 ENCOUNTER — Ambulatory Visit

## 2023-08-22 ENCOUNTER — Other Ambulatory Visit: Payer: Self-pay | Admitting: Family Medicine

## 2023-08-22 DIAGNOSIS — E538 Deficiency of other specified B group vitamins: Secondary | ICD-10-CM | POA: Diagnosis not present

## 2023-08-22 MED ORDER — CYANOCOBALAMIN 1000 MCG/ML IJ SOLN
1000.0000 ug | Freq: Once | INTRAMUSCULAR | Status: AC
  Administered 2023-08-22: 1000 ug via INTRAMUSCULAR

## 2023-08-22 NOTE — Progress Notes (Signed)
 Per orders of Dr. Crawford Givens, injection of vitamin b 12 given by Lewanda Rife in right deltoid. Patient tolerated injection well. Patient will make appointment for 1 month.

## 2023-08-24 ENCOUNTER — Encounter: Payer: Self-pay | Admitting: Family Medicine

## 2023-08-27 NOTE — Telephone Encounter (Signed)
 [name]

## 2023-08-27 NOTE — Telephone Encounter (Signed)
 I spoke with pts wife (DPR signed) that Dr Vallarie Gauze sent in pregabalin  refill to [facility] [address] on 08/26/23.. I spoke with [place] at [facility] [address] and rx is ready for pick up. Pts wife voiced understanding and appreciative.

## 2023-09-04 ENCOUNTER — Telehealth: Payer: Self-pay | Admitting: Cardiovascular Disease

## 2023-09-04 NOTE — Telephone Encounter (Signed)
*  STAT* If patient is at the pharmacy, call can be transferred to refill team.   1. Which medications need to be refilled? (please list name of each medication and dose if known) TORSEMIDE  PO   2. Which pharmacy/location (including street and city if local pharmacy) is medication to be sent to? [id number] - [place], [place] - [address]   3. Do they need a 30 day or 90 day supply? 90

## 2023-09-07 ENCOUNTER — Other Ambulatory Visit: Payer: Self-pay | Admitting: Cardiovascular Disease

## 2023-09-10 ENCOUNTER — Encounter: Payer: Self-pay | Admitting: Family Medicine

## 2023-09-10 ENCOUNTER — Ambulatory Visit (INDEPENDENT_AMBULATORY_CARE_PROVIDER_SITE_OTHER)
Admission: RE | Admit: 2023-09-10 | Discharge: 2023-09-10 | Disposition: A | Discharge status: Home / Self Care | Source: Ambulatory Visit | Attending: Family Medicine | Admitting: Family Medicine

## 2023-09-10 ENCOUNTER — Ambulatory Visit: Admitting: Family Medicine

## 2023-09-10 VITALS — BP 134/74 | HR 58 | Temp 97.8°F | Ht 69.13 in | Wt 273.6 lb

## 2023-09-10 DIAGNOSIS — M79673 Pain in unspecified foot: Secondary | ICD-10-CM

## 2023-09-10 DIAGNOSIS — R399 Unspecified symptoms and signs involving the genitourinary system: Secondary | ICD-10-CM

## 2023-09-10 DIAGNOSIS — N320 Bladder-neck obstruction: Secondary | ICD-10-CM

## 2023-09-10 DIAGNOSIS — H9190 Unspecified hearing loss, unspecified ear: Secondary | ICD-10-CM

## 2023-09-10 DIAGNOSIS — E1149 Type 2 diabetes mellitus with other diabetic neurological complication: Secondary | ICD-10-CM

## 2023-09-10 DIAGNOSIS — Z125 Encounter for screening for malignant neoplasm of prostate: Secondary | ICD-10-CM

## 2023-09-10 DIAGNOSIS — Z0000 Encounter for general adult medical examination without abnormal findings: Secondary | ICD-10-CM

## 2023-09-10 DIAGNOSIS — D649 Anemia, unspecified: Secondary | ICD-10-CM | POA: Diagnosis not present

## 2023-09-10 DIAGNOSIS — R748 Abnormal levels of other serum enzymes: Secondary | ICD-10-CM | POA: Diagnosis not present

## 2023-09-10 DIAGNOSIS — G2581 Restless legs syndrome: Secondary | ICD-10-CM | POA: Diagnosis not present

## 2023-09-10 DIAGNOSIS — E538 Deficiency of other specified B group vitamins: Secondary | ICD-10-CM

## 2023-09-10 DIAGNOSIS — N401 Enlarged prostate with lower urinary tract symptoms: Secondary | ICD-10-CM

## 2023-09-10 DIAGNOSIS — M19071 Primary osteoarthritis, right ankle and foot: Secondary | ICD-10-CM | POA: Diagnosis not present

## 2023-09-10 DIAGNOSIS — I10 Essential (primary) hypertension: Secondary | ICD-10-CM

## 2023-09-10 DIAGNOSIS — M79671 Pain in right foot: Secondary | ICD-10-CM | POA: Diagnosis not present

## 2023-09-10 DIAGNOSIS — Z7189 Other specified counseling: Secondary | ICD-10-CM

## 2023-09-10 DIAGNOSIS — R21 Rash and other nonspecific skin eruption: Secondary | ICD-10-CM | POA: Diagnosis not present

## 2023-09-10 MED ORDER — GABAPENTIN 300 MG PO CAPS: ORAL_CAPSULE | ORAL | 3 refills | Status: DC

## 2023-09-10 NOTE — Patient Instructions (Addendum)
 Lyrica  100mg  a day for 3 days.  Start gabapentin  in the meantime.  600mg  daily for 3 days. Then gradually increase to 600mg  twice a day as needed.  Let me know how that goes.  Take care.  Glad to see you.

## 2023-09-10 NOTE — Progress Notes (Unsigned)
 D/w pt about prev Alk Phos level.   PSA pending.  He has urgency, leakage, presumed BPH sx with bladder neck sx.  Still on flomax  at baseline. Taking 0.8mg  flomax .    Hypertension:    Using medication without problems or lightheadedness: rarely lightheaded.   Chest pain with exertion:no Edema:some BLE Short of breath: rare, noted with climbing steps or walking distance.   Labs pending.   H/o DM2.  No meds.   Hypoglycemic episodes: no Hyperglycemic episodes: no Still dealing with BLE pain from neuropathy.  Taking lyrica  100mg  BID.  We talked about trial of gabapentin .  He tried it with some relief.  Burning pain, worse in the AM.  Stinging pain later in the day.  Blood Sugars averaging: usually ~120-130, higher with eating ice cream.  eye exam within last year: yes Labs pending.   RLS on requip  at baseline.  Labs pending.  Requip  helped with sx.  No ADE on med.    Skin changes.  He had scratched the itchy area on the R posterior shoulder.  Chronic post inflammatory changes.  Temovate  didn't help.    He had a knot on the R shin that is getting smaller w/o pain or bruising.    Hearing d/w pt.  D/w pt about hearing eval.  Referral placed.    Flu shot 2024 PNA UTD Shingrix d/w pt.  Tetanus 2024 RSV vaccine d/w pt.    Advance directive- wife designated if patient were incapacitated.  Cologuard 2020.  Deferred screening 2025.    PMH and SH reviewed  Meds, vitals, and allergies reviewed.   ROS: Per HPI unless specifically indicated in ROS section   GEN: nad, alert and oriented HEENT: ncat NECK: supple w/o LA CV: IRR not tachy.   PULM: ctab, no inc wob ABD: soft, +bs EXT: no edema SKIN: no acute rash  Diabetic foot exam: Normal inspection No skin breakdown No calluses  Normal DP pulses Normal sensation to light touch and monofilament Nails thickened.

## 2023-09-11 ENCOUNTER — Ambulatory Visit: Payer: Self-pay | Admitting: Family Medicine

## 2023-09-11 ENCOUNTER — Other Ambulatory Visit: Payer: Self-pay | Admitting: Family Medicine

## 2023-09-11 DIAGNOSIS — H9190 Unspecified hearing loss, unspecified ear: Secondary | ICD-10-CM | POA: Insufficient documentation

## 2023-09-11 DIAGNOSIS — R21 Rash and other nonspecific skin eruption: Secondary | ICD-10-CM

## 2023-09-11 DIAGNOSIS — R748 Abnormal levels of other serum enzymes: Secondary | ICD-10-CM | POA: Insufficient documentation

## 2023-09-11 DIAGNOSIS — N400 Enlarged prostate without lower urinary tract symptoms: Secondary | ICD-10-CM | POA: Insufficient documentation

## 2023-09-11 LAB — COMPREHENSIVE METABOLIC PANEL WITH GFR
AG Ratio: 1.1 (calc) (ref 1.0–2.5)
ALT: 13 U/L (ref 9–46)
AST: 9 U/L — ABNORMAL LOW (ref 10–35)
Albumin: 3.7 g/dL (ref 3.6–5.1)
Alkaline phosphatase (APISO): 177 U/L — ABNORMAL HIGH (ref 35–144)
BUN/Creatinine Ratio: 16 (calc) (ref 6–22)
BUN: 27 mg/dL — ABNORMAL HIGH (ref 7–25)
CO2: 22 mmol/L (ref 20–32)
Calcium: 9.2 mg/dL (ref 8.6–10.3)
Chloride: 111 mmol/L — ABNORMAL HIGH (ref 98–110)
Creat: 1.64 mg/dL — ABNORMAL HIGH (ref 0.70–1.28)
Globulin: 3.4 g/dL (ref 1.9–3.7)
Glucose, Bld: 118 mg/dL — ABNORMAL HIGH (ref 65–99)
Potassium: 4.5 mmol/L (ref 3.5–5.3)
Sodium: 142 mmol/L (ref 135–146)
Total Bilirubin: 0.5 mg/dL (ref 0.2–1.2)
Total Protein: 7.1 g/dL (ref 6.1–8.1)
eGFR: 43 mL/min/{1.73_m2} — ABNORMAL LOW (ref >=60)

## 2023-09-11 LAB — CBC WITH DIFFERENTIAL/PLATELET
Absolute Lymphocytes: 1146 {cells}/uL (ref 850–3900)
Absolute Monocytes: 503 {cells}/uL (ref 200–950)
Basophils Absolute: 54 {cells}/uL (ref 0–200)
Basophils Relative: 0.8 %
Eosinophils Absolute: 261 {cells}/uL (ref 15–500)
Eosinophils Relative: 3.9 %
HCT: 29.4 % — ABNORMAL LOW (ref 38.5–50.0)
Hemoglobin: 9.7 g/dL — ABNORMAL LOW (ref 13.2–17.1)
MCH: 30.7 pg (ref 27.0–33.0)
MCHC: 33 g/dL (ref 32.0–36.0)
MCV: 93 fL (ref 80.0–100.0)
MPV: 10.9 fL (ref 7.5–12.5)
Monocytes Relative: 7.5 %
Neutro Abs: 4737 {cells}/uL (ref 1500–7800)
Neutrophils Relative %: 70.7 %
Platelets: 209 10*3/uL (ref 140–400)
RBC: 3.16 10*6/uL — ABNORMAL LOW (ref 4.20–5.80)
RDW: 12.4 % (ref 11.0–15.0)
Total Lymphocyte: 17.1 %
WBC: 6.7 10*3/uL (ref 3.8–10.8)

## 2023-09-11 LAB — LIPID PANEL
Cholesterol: 147 mg/dL (ref <200)
HDL: 29 mg/dL — ABNORMAL LOW (ref >=40)
LDL Cholesterol (Calc): 91 mg/dL
Non-HDL Cholesterol (Calc): 118 mg/dL (ref <130)
Total CHOL/HDL Ratio: 5.1 (calc) — ABNORMAL HIGH (ref <5.0)
Triglycerides: 173 mg/dL — ABNORMAL HIGH (ref <150)

## 2023-09-11 LAB — HEMOGLOBIN A1C
Hgb A1c MFr Bld: 6.7 % — ABNORMAL HIGH (ref <5.7)
Mean Plasma Glucose: 146 mg/dL
eAG (mmol/L): 8.1 mmol/L

## 2023-09-11 LAB — MICROALBUMIN / CREATININE URINE RATIO
Creatinine, Urine: 90 mg/dL (ref 20–320)
Microalb Creat Ratio: 20 mg/g{creat} (ref <30)
Microalb, Ur: 1.8 mg/dL

## 2023-09-11 LAB — FERRITIN: Ferritin: 61 ng/mL (ref 24–380)

## 2023-09-11 LAB — TSH: TSH: 4.04 m[IU]/L (ref 0.40–4.50)

## 2023-09-11 LAB — PSA: PSA: 1.11 ng/mL (ref <=4.00)

## 2023-09-11 LAB — VITAMIN B12: Vitamin B-12: 642 pg/mL (ref 200–1100)

## 2023-09-11 NOTE — Assessment & Plan Note (Addendum)
 Discussed tapering Lyrica  to 100 mg daily and starting gabapentin  600 mg.  After he ceased his Lyrica  he could try gradually increasing gabapentin  up to 200 mg twice a day.  See notes on labs.  Discussed checking plain film of his foot given his chronic foot pain.  See notes on imaging.

## 2023-09-11 NOTE — Assessment & Plan Note (Signed)
 Refer

## 2023-09-11 NOTE — Assessment & Plan Note (Signed)
 Advance directive- wife designated if patient were incapacitated.

## 2023-09-11 NOTE — Assessment & Plan Note (Signed)
See notes on follow-up labs. 

## 2023-09-11 NOTE — Assessment & Plan Note (Signed)
 Continue amlodipine  lisinopril  torsemide .  Labs pending.

## 2023-09-11 NOTE — Assessment & Plan Note (Signed)
 Continue Requip .  See notes on labs.

## 2023-09-11 NOTE — Assessment & Plan Note (Signed)
 Continue Flomax  0.8 mg daily.  See notes on labs.

## 2023-09-11 NOTE — Assessment & Plan Note (Signed)
 I am going to check on options more potent than Temovate .  Discussed.

## 2023-09-11 NOTE — Assessment & Plan Note (Signed)
 Flu shot 2024 PNA UTD Shingrix d/w pt.  Tetanus 2024 RSV vaccine d/w pt.    Advance directive- wife designated if patient were incapacitated.  Cologuard 2020.  Deferred screening 2025.

## 2023-09-24 ENCOUNTER — Ambulatory Visit (INDEPENDENT_AMBULATORY_CARE_PROVIDER_SITE_OTHER)

## 2023-09-24 DIAGNOSIS — E538 Deficiency of other specified B group vitamins: Secondary | ICD-10-CM

## 2023-09-24 MED ORDER — CYANOCOBALAMIN 1000 MCG/ML IJ SOLN
1000.0000 ug | Freq: Once | INTRAMUSCULAR | Status: AC
Start: 2023-09-24 — End: 2023-09-24
  Administered 2023-09-24: 1000 ug via INTRAMUSCULAR

## 2023-09-24 NOTE — Progress Notes (Signed)
Per orders of Dr. Crawford Givens, injection of B-12 given by Melina Copa in left deltoid. Patient tolerated injection well. Patient will make appointment for 1 month.

## 2023-09-30 ENCOUNTER — Other Ambulatory Visit: Payer: Self-pay | Admitting: Rheumatology

## 2023-09-30 DIAGNOSIS — L409 Psoriasis, unspecified: Secondary | ICD-10-CM

## 2023-09-30 DIAGNOSIS — Z79899 Other long term (current) drug therapy: Secondary | ICD-10-CM

## 2023-09-30 DIAGNOSIS — L4050 Arthropathic psoriasis, unspecified: Secondary | ICD-10-CM

## 2023-09-30 NOTE — Telephone Encounter (Signed)
 Last Fill: 07/23/2023  Labs: 09/10/2023 RBC 3.16 Hemoglobin 9.7 HCT 29.4 Glucose 118 BUN 27 Creat 1.64 eGFR 43 Chloride 111 APISO 177 AST 9  TB Gold: 07/17/2023 TB Gold Negative   Next Visit: 01/08/2024  Last Visit: 08/08/2023  DX: Psoriatic arthritis   Current Dose per office note 08/08/2023: Taltz  80 mg sq injections 28 days   Okay to refill Taltz ?

## 2023-10-07 ENCOUNTER — Other Ambulatory Visit: Payer: Self-pay | Admitting: Psychiatry

## 2023-10-07 DIAGNOSIS — F331 Major depressive disorder, recurrent, moderate: Secondary | ICD-10-CM

## 2023-10-07 DIAGNOSIS — H353221 Exudative age-related macular degeneration, left eye, with active choroidal neovascularization: Secondary | ICD-10-CM | POA: Diagnosis not present

## 2023-10-07 DIAGNOSIS — F4310 Post-traumatic stress disorder, unspecified: Secondary | ICD-10-CM

## 2023-10-09 DIAGNOSIS — H903 Sensorineural hearing loss, bilateral: Secondary | ICD-10-CM | POA: Diagnosis not present

## 2023-10-09 DIAGNOSIS — J329 Chronic sinusitis, unspecified: Secondary | ICD-10-CM | POA: Diagnosis not present

## 2023-10-24 ENCOUNTER — Ambulatory Visit (INDEPENDENT_AMBULATORY_CARE_PROVIDER_SITE_OTHER)

## 2023-10-24 DIAGNOSIS — E538 Deficiency of other specified B group vitamins: Secondary | ICD-10-CM

## 2023-10-24 MED ORDER — CYANOCOBALAMIN 1000 MCG/ML IJ SOLN
1000.0000 ug | Freq: Once | INTRAMUSCULAR | Status: AC
  Administered 2023-10-24: 1000 ug via INTRAMUSCULAR

## 2023-10-24 NOTE — Progress Notes (Signed)
 Per orders of Lynwood Crandall, NP , injection of B-12 given by Danna CINDERELLA Hummer in right deltoid. Patient tolerated injection well. Patient will make appointment for 1 month.

## 2023-10-28 ENCOUNTER — Other Ambulatory Visit: Payer: Self-pay | Admitting: Cardiovascular Disease

## 2023-10-30 ENCOUNTER — Telehealth: Payer: Self-pay | Admitting: Internal Medicine

## 2023-10-30 NOTE — Telephone Encounter (Signed)
*  STAT* If patient is at the pharmacy, call can be transferred to refill team.   1. Which medications need to be refilled? (please list name of each medication and dose if known) amLODipine  (NORVASC ) 5 MG tablet  lisinopril  (ZESTRIL ) 40 MG tablet    2. Would you like to learn more about the convenience, safety, & potential cost savings by using the [facility]?    3. Are you open to using the [facility] (Type [facility].  ).   4. Which pharmacy/location (including street and city if local pharmacy) is medication to be sent to? [id number] - [place], [place] - [address]    5. Do they need a 30 day or 90 day supply? 90 day

## 2023-10-31 ENCOUNTER — Other Ambulatory Visit (HOSPITAL_BASED_OUTPATIENT_CLINIC_OR_DEPARTMENT_OTHER): Payer: Self-pay

## 2023-10-31 MED ORDER — LISINOPRIL 40 MG PO TABS: 40.0000 mg | ORAL_TABLET | Freq: Every day | ORAL | 3 refills | Status: AC

## 2023-10-31 MED ORDER — AMLODIPINE BESYLATE 5 MG PO TABS
5.0000 mg | ORAL_TABLET | Freq: Every day | ORAL | 3 refills | Status: DC
  Filled 2023-10-31: qty 90, 90d supply, fill #0

## 2023-11-11 ENCOUNTER — Other Ambulatory Visit (HOSPITAL_BASED_OUTPATIENT_CLINIC_OR_DEPARTMENT_OTHER): Payer: Self-pay

## 2023-11-11 ENCOUNTER — Other Ambulatory Visit: Payer: Self-pay | Admitting: Family Medicine

## 2023-11-22 DIAGNOSIS — L821 Other seborrheic keratosis: Secondary | ICD-10-CM | POA: Diagnosis not present

## 2023-11-22 DIAGNOSIS — D225 Melanocytic nevi of trunk: Secondary | ICD-10-CM | POA: Diagnosis not present

## 2023-11-22 DIAGNOSIS — L281 Prurigo nodularis: Secondary | ICD-10-CM | POA: Diagnosis not present

## 2023-11-22 DIAGNOSIS — L280 Lichen simplex chronicus: Secondary | ICD-10-CM | POA: Diagnosis not present

## 2023-11-22 DIAGNOSIS — D2261 Melanocytic nevi of right upper limb, including shoulder: Secondary | ICD-10-CM | POA: Diagnosis not present

## 2023-11-22 DIAGNOSIS — D2272 Melanocytic nevi of left lower limb, including hip: Secondary | ICD-10-CM | POA: Diagnosis not present

## 2023-11-22 DIAGNOSIS — L309 Dermatitis, unspecified: Secondary | ICD-10-CM | POA: Diagnosis not present

## 2023-11-22 DIAGNOSIS — D2271 Melanocytic nevi of right lower limb, including hip: Secondary | ICD-10-CM | POA: Diagnosis not present

## 2023-11-22 DIAGNOSIS — L989 Disorder of the skin and subcutaneous tissue, unspecified: Secondary | ICD-10-CM | POA: Diagnosis not present

## 2023-11-22 DIAGNOSIS — D2262 Melanocytic nevi of left upper limb, including shoulder: Secondary | ICD-10-CM | POA: Diagnosis not present

## 2023-11-26 ENCOUNTER — Encounter: Payer: Self-pay | Admitting: Family Medicine

## 2023-11-26 ENCOUNTER — Ambulatory Visit (INDEPENDENT_AMBULATORY_CARE_PROVIDER_SITE_OTHER)

## 2023-11-26 DIAGNOSIS — E538 Deficiency of other specified B group vitamins: Secondary | ICD-10-CM | POA: Diagnosis not present

## 2023-11-26 MED ORDER — CYANOCOBALAMIN 1000 MCG/ML IJ SOLN
1000.0000 ug | Freq: Once | INTRAMUSCULAR | Status: AC
  Administered 2023-11-26: 1000 ug via INTRAMUSCULAR

## 2023-11-26 NOTE — Progress Notes (Signed)
 Per orders of Dr. Cleatus, monthly injection of B12 1000 mcg/ml given by Clotilda SHAUNNA Pander, CMA in Left Deltoid. Patient tolerated injection well.

## 2023-11-27 ENCOUNTER — Other Ambulatory Visit: Payer: Self-pay | Admitting: Family Medicine

## 2023-12-04 ENCOUNTER — Other Ambulatory Visit: Payer: Self-pay | Admitting: Family Medicine

## 2023-12-04 ENCOUNTER — Encounter: Payer: Self-pay | Admitting: Family Medicine

## 2023-12-04 MED ORDER — PREGABALIN 100 MG PO CAPS: 100.0000 mg | ORAL_CAPSULE | Freq: Two times a day (BID) | ORAL | Status: DC

## 2023-12-19 DIAGNOSIS — M461 Sacroiliitis, not elsewhere classified: Secondary | ICD-10-CM | POA: Diagnosis not present

## 2023-12-19 DIAGNOSIS — M1612 Unilateral primary osteoarthritis, left hip: Secondary | ICD-10-CM | POA: Diagnosis not present

## 2023-12-19 DIAGNOSIS — M51362 Other intervertebral disc degeneration, lumbar region with discogenic back pain and lower extremity pain: Secondary | ICD-10-CM | POA: Diagnosis not present

## 2023-12-19 DIAGNOSIS — M5416 Radiculopathy, lumbar region: Secondary | ICD-10-CM | POA: Diagnosis not present

## 2023-12-19 DIAGNOSIS — M25552 Pain in left hip: Secondary | ICD-10-CM | POA: Diagnosis not present

## 2023-12-20 ENCOUNTER — Other Ambulatory Visit: Payer: Self-pay | Admitting: Student

## 2023-12-20 DIAGNOSIS — M461 Sacroiliitis, not elsewhere classified: Secondary | ICD-10-CM

## 2023-12-20 DIAGNOSIS — M51372 Other intervertebral disc degeneration, lumbosacral region with discogenic back pain and lower extremity pain: Secondary | ICD-10-CM

## 2023-12-20 DIAGNOSIS — M5416 Radiculopathy, lumbar region: Secondary | ICD-10-CM

## 2023-12-20 DIAGNOSIS — M25552 Pain in left hip: Secondary | ICD-10-CM

## 2023-12-25 ENCOUNTER — Ambulatory Visit
Admission: RE | Admit: 2023-12-25 | Discharge: 2023-12-25 | Disposition: A | Discharge status: Home / Self Care | Source: Ambulatory Visit | Attending: Student | Admitting: Student

## 2023-12-25 DIAGNOSIS — M5116 Intervertebral disc disorders with radiculopathy, lumbar region: Secondary | ICD-10-CM | POA: Diagnosis not present

## 2023-12-25 DIAGNOSIS — M4807 Spinal stenosis, lumbosacral region: Secondary | ICD-10-CM | POA: Diagnosis not present

## 2023-12-25 DIAGNOSIS — M5416 Radiculopathy, lumbar region: Secondary | ICD-10-CM | POA: Insufficient documentation

## 2023-12-25 DIAGNOSIS — M51372 Other intervertebral disc degeneration, lumbosacral region with discogenic back pain and lower extremity pain: Secondary | ICD-10-CM | POA: Insufficient documentation

## 2023-12-25 DIAGNOSIS — M48061 Spinal stenosis, lumbar region without neurogenic claudication: Secondary | ICD-10-CM | POA: Diagnosis not present

## 2023-12-25 DIAGNOSIS — M25552 Pain in left hip: Secondary | ICD-10-CM | POA: Diagnosis not present

## 2023-12-25 DIAGNOSIS — M461 Sacroiliitis, not elsewhere classified: Secondary | ICD-10-CM | POA: Insufficient documentation

## 2023-12-25 DIAGNOSIS — M4316 Spondylolisthesis, lumbar region: Secondary | ICD-10-CM | POA: Diagnosis not present
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
## 2023-12-27 ENCOUNTER — Other Ambulatory Visit: Payer: Self-pay | Admitting: Physician Assistant

## 2023-12-27 DIAGNOSIS — L4050 Arthropathic psoriasis, unspecified: Secondary | ICD-10-CM

## 2023-12-27 DIAGNOSIS — L409 Psoriasis, unspecified: Secondary | ICD-10-CM

## 2023-12-27 DIAGNOSIS — Z79899 Other long term (current) drug therapy: Secondary | ICD-10-CM

## 2023-12-27 NOTE — Telephone Encounter (Signed)
 Last Fill: 09/30/2023  Labs: 09/10/2023 Glucose 118, BUN 27, Creat. 1.64, GFR 43, Chloride 111, Alk Phos 177, AST 9, RBC 3.16, Hgb 9.7, Hct 29.4  TB Gold: 07/17/2023 Neg   Next Visit: 01/08/2024  Last Visit: 08/08/2023  IK:Ednmpjupr arthritis   Current Dose per office note 08/08/2023: Taltz  80 mg sq injections 28 days   Patient to update labs at upcoming appointment on 01/08/2024  Okay to refill Taltz ?

## 2023-12-31 ENCOUNTER — Ambulatory Visit (INDEPENDENT_AMBULATORY_CARE_PROVIDER_SITE_OTHER)

## 2023-12-31 DIAGNOSIS — E538 Deficiency of other specified B group vitamins: Secondary | ICD-10-CM

## 2023-12-31 MED ORDER — CYANOCOBALAMIN 1000 MCG/ML IJ SOLN
1000.0000 ug | Freq: Once | INTRAMUSCULAR | Status: AC
  Administered 2023-12-31: 1000 ug via INTRAMUSCULAR

## 2023-12-31 NOTE — Progress Notes (Signed)
 Per orders of Dr. Arlyss Solian, injection of B-12 given by Nellie Hummer in right deltoid. Patient tolerated injection well.

## 2024-01-02 ENCOUNTER — Telehealth: Payer: Self-pay

## 2024-01-02 NOTE — Telephone Encounter (Signed)
 Left message for patient to return call to see if I can get him seen sooner than December. He was to follow up in 3 months from his 6/10 appt. If he calls back please give him a sooner appointment other than the one he has in December. Cancel the December appointment once a new appointment is made.

## 2024-01-05 ENCOUNTER — Other Ambulatory Visit: Payer: Self-pay | Admitting: Family Medicine

## 2024-01-05 ENCOUNTER — Telehealth: Payer: Self-pay | Admitting: Family Medicine

## 2024-01-05 MED ORDER — PREDNISONE 10 MG PO TABS: ORAL_TABLET | ORAL | 0 refills | Status: DC

## 2024-01-05 NOTE — Telephone Encounter (Signed)
 See MyChart message

## 2024-01-07 DIAGNOSIS — H353221 Exudative age-related macular degeneration, left eye, with active choroidal neovascularization: Secondary | ICD-10-CM | POA: Diagnosis not present

## 2024-01-07 DIAGNOSIS — Z961 Presence of intraocular lens: Secondary | ICD-10-CM | POA: Diagnosis not present

## 2024-01-07 DIAGNOSIS — H353114 Nonexudative age-related macular degeneration, right eye, advanced atrophic with subfoveal involvement: Secondary | ICD-10-CM | POA: Diagnosis not present

## 2024-01-08 ENCOUNTER — Ambulatory Visit: Admitting: Physician Assistant

## 2024-01-08 DIAGNOSIS — Z8639 Personal history of other endocrine, nutritional and metabolic disease: Secondary | ICD-10-CM

## 2024-01-08 DIAGNOSIS — M19071 Primary osteoarthritis, right ankle and foot: Secondary | ICD-10-CM

## 2024-01-08 DIAGNOSIS — M7662 Achilles tendinitis, left leg: Secondary | ICD-10-CM

## 2024-01-08 DIAGNOSIS — M51369 Other intervertebral disc degeneration, lumbar region without mention of lumbar back pain or lower extremity pain: Secondary | ICD-10-CM

## 2024-01-08 DIAGNOSIS — L409 Psoriasis, unspecified: Secondary | ICD-10-CM

## 2024-01-08 DIAGNOSIS — L4050 Arthropathic psoriasis, unspecified: Secondary | ICD-10-CM

## 2024-01-08 DIAGNOSIS — I472 Ventricular tachycardia, unspecified: Secondary | ICD-10-CM

## 2024-01-08 DIAGNOSIS — Z87448 Personal history of other diseases of urinary system: Secondary | ICD-10-CM

## 2024-01-08 DIAGNOSIS — M722 Plantar fascial fibromatosis: Secondary | ICD-10-CM

## 2024-01-08 DIAGNOSIS — Z9181 History of falling: Secondary | ICD-10-CM

## 2024-01-08 DIAGNOSIS — Z79899 Other long term (current) drug therapy: Secondary | ICD-10-CM

## 2024-01-08 DIAGNOSIS — M19042 Primary osteoarthritis, left hand: Secondary | ICD-10-CM

## 2024-01-08 DIAGNOSIS — Z96653 Presence of artificial knee joint, bilateral: Secondary | ICD-10-CM

## 2024-01-08 DIAGNOSIS — Z7901 Long term (current) use of anticoagulants: Secondary | ICD-10-CM

## 2024-01-08 DIAGNOSIS — Z8669 Personal history of other diseases of the nervous system and sense organs: Secondary | ICD-10-CM

## 2024-01-08 DIAGNOSIS — G2581 Restless legs syndrome: Secondary | ICD-10-CM

## 2024-01-08 DIAGNOSIS — Z8679 Personal history of other diseases of the circulatory system: Secondary | ICD-10-CM
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
## 2024-01-23 DIAGNOSIS — M1612 Unilateral primary osteoarthritis, left hip: Secondary | ICD-10-CM | POA: Diagnosis not present

## 2024-01-23 DIAGNOSIS — M5416 Radiculopathy, lumbar region: Secondary | ICD-10-CM | POA: Diagnosis not present

## 2024-01-23 DIAGNOSIS — M48062 Spinal stenosis, lumbar region with neurogenic claudication: Secondary | ICD-10-CM | POA: Diagnosis not present

## 2024-01-23 DIAGNOSIS — M25552 Pain in left hip: Secondary | ICD-10-CM | POA: Diagnosis not present

## 2024-01-27 ENCOUNTER — Ambulatory Visit (INDEPENDENT_AMBULATORY_CARE_PROVIDER_SITE_OTHER): Admitting: Family Medicine

## 2024-01-27 VITALS — BP 128/78 | HR 73 | Temp 97.8°F | Ht 69.13 in | Wt 269.0 lb

## 2024-01-27 DIAGNOSIS — E538 Deficiency of other specified B group vitamins: Secondary | ICD-10-CM | POA: Diagnosis not present

## 2024-01-27 DIAGNOSIS — Z23 Encounter for immunization: Secondary | ICD-10-CM | POA: Diagnosis not present

## 2024-01-27 DIAGNOSIS — I10 Essential (primary) hypertension: Secondary | ICD-10-CM | POA: Diagnosis not present

## 2024-01-27 DIAGNOSIS — R21 Rash and other nonspecific skin eruption: Secondary | ICD-10-CM

## 2024-01-27 DIAGNOSIS — G629 Polyneuropathy, unspecified: Secondary | ICD-10-CM

## 2024-01-27 DIAGNOSIS — E1149 Type 2 diabetes mellitus with other diabetic neurological complication: Secondary | ICD-10-CM

## 2024-01-27 LAB — POCT GLYCOSYLATED HEMOGLOBIN (HGB A1C): Hemoglobin A1C: 6.5 % — AB (ref 4.0–5.6)

## 2024-01-27 MED ORDER — CYANOCOBALAMIN 1000 MCG/ML IJ SOLN: 1000.0000 ug | INTRAMUSCULAR | Status: AC

## 2024-01-27 MED ORDER — CYANOCOBALAMIN 1000 MCG/ML IJ SOLN
1000.0000 ug | Freq: Once | INTRAMUSCULAR | Status: AC
  Administered 2024-01-27: 1000 ug via INTRAMUSCULAR

## 2024-01-27 NOTE — Patient Instructions (Addendum)
 Please ask the front about a record release from [facility] and [facility].  I need your records from the last year.    See if the hip injection also helps with itching.    B12 dose every 3 weeks.  See if that helps.  Let me know how it goes.   Take care.  Glad to see you.  Recheck A1c at a visit in about 4 months.

## 2024-01-27 NOTE — Progress Notes (Unsigned)
 Diabetes:  No meds.  Hypoglycemic episodes: no Hyperglycemic episodes: no Feet problems:still with pain in the BLE.  Some variable R>L LE edema.   Blood Sugars averaging: usually ~130.   eye exam within last year: yes, requesting records.   A1c 6.5.    He has hip injection pending per outside clinic.    He saw dermatology, advised to use sarna.  That helped some with itching.  Records requested today. Unclear if the lesions are from itching then scratching with chronic excoriation locally on the R upper back and R hip area.    2 lesions R medial upper shin, present for ~a month.  Superficially scabbed lesions w/o ulceration. 1 is 1cm, the other is 0.5mg .  They don't look typical for a SCC/BCC/melanoma.  He used diprolene on the area.  That helped some.    He can tolerate amlodipine  5mg  a day.  Had been off rx for about 6 months.  Rx not sent today and wouldn't restart given normal BP.   We talked about itching and joint pain, possibly from lyrica . He didn't notice a change when off med and when he was on gabapentin .    Neuropathy d/w pt.  D/w pt about trying B12 every 3 weeks.  He feels a change after each injection.    PMH and SH reviewed  Meds, vitals, and allergies reviewed.   ROS: Per HPI unless specifically indicated in ROS section   GEN: nad, alert and oriented HEENT: mucous membranes moist NECK: supple w/o LA CV: rrr. PULM: ctab, no inc wob ABD: soft, +bs EXT: no edema SKIN: see above with chronic irritation on the R upper back and R hip area.   2 lesions R medial upper shin.  Superficially scabbed lesions w/o ulceration. 1 is 1cm, the other is 0.5mg .  They don't look typical for a SCC/BCC/melanoma.   Pain with L hip int rotation.   32 minutes were devoted to patient care in this encounter (this includes time spent reviewing the patient's file/history, interviewing and examining the patient, counseling/reviewing plan with patient).

## 2024-01-29 NOTE — Assessment & Plan Note (Signed)
 Neuropathy d/w pt.  D/w pt about trying B12 every 3 weeks.  He feels a change after each injection.   He can let me know if he sees any improvement with that.

## 2024-01-29 NOTE — Assessment & Plan Note (Signed)
 Requesting dermatology reports.  Would continue using Sarna in the meantime.  He has an upcoming hip injection and it would be reasonable to see if the itching/rash gets any better with that steroid injection.  He can update me as needed.

## 2024-01-29 NOTE — Assessment & Plan Note (Signed)
 A1c 6.5.   No change in meds.  Recheck A1c at a visit in about 4 months.

## 2024-01-30 ENCOUNTER — Ambulatory Visit

## 2024-01-31 DIAGNOSIS — M25652 Stiffness of left hip, not elsewhere classified: Secondary | ICD-10-CM | POA: Diagnosis not present

## 2024-01-31 DIAGNOSIS — M25552 Pain in left hip: Secondary | ICD-10-CM | POA: Diagnosis not present

## 2024-01-31 DIAGNOSIS — M4727 Other spondylosis with radiculopathy, lumbosacral region: Secondary | ICD-10-CM | POA: Diagnosis not present

## 2024-01-31 DIAGNOSIS — R531 Weakness: Secondary | ICD-10-CM | POA: Diagnosis not present

## 2024-02-04 ENCOUNTER — Encounter: Payer: Self-pay | Admitting: Physician Assistant

## 2024-02-04 ENCOUNTER — Ambulatory Visit

## 2024-02-04 ENCOUNTER — Ambulatory Visit: Attending: Physician Assistant | Admitting: Physician Assistant

## 2024-02-04 VITALS — BP 124/65 | HR 76 | Temp 98.0°F | Resp 16 | Ht 69.5 in | Wt 266.0 lb

## 2024-02-04 DIAGNOSIS — Z87448 Personal history of other diseases of urinary system: Secondary | ICD-10-CM | POA: Diagnosis present

## 2024-02-04 DIAGNOSIS — Z9181 History of falling: Secondary | ICD-10-CM | POA: Diagnosis not present

## 2024-02-04 DIAGNOSIS — G2581 Restless legs syndrome: Secondary | ICD-10-CM

## 2024-02-04 DIAGNOSIS — I472 Ventricular tachycardia, unspecified: Secondary | ICD-10-CM | POA: Diagnosis present

## 2024-02-04 DIAGNOSIS — Z96653 Presence of artificial knee joint, bilateral: Secondary | ICD-10-CM

## 2024-02-04 DIAGNOSIS — Z8679 Personal history of other diseases of the circulatory system: Secondary | ICD-10-CM

## 2024-02-04 DIAGNOSIS — L4050 Arthropathic psoriasis, unspecified: Secondary | ICD-10-CM

## 2024-02-04 DIAGNOSIS — M25532 Pain in left wrist: Secondary | ICD-10-CM

## 2024-02-04 DIAGNOSIS — Z8639 Personal history of other endocrine, nutritional and metabolic disease: Secondary | ICD-10-CM

## 2024-02-04 DIAGNOSIS — M19041 Primary osteoarthritis, right hand: Secondary | ICD-10-CM | POA: Diagnosis present

## 2024-02-04 DIAGNOSIS — M51369 Other intervertebral disc degeneration, lumbar region without mention of lumbar back pain or lower extremity pain: Secondary | ICD-10-CM

## 2024-02-04 DIAGNOSIS — Z7901 Long term (current) use of anticoagulants: Secondary | ICD-10-CM

## 2024-02-04 DIAGNOSIS — M19071 Primary osteoarthritis, right ankle and foot: Secondary | ICD-10-CM | POA: Diagnosis not present

## 2024-02-04 DIAGNOSIS — Z79899 Other long term (current) drug therapy: Secondary | ICD-10-CM | POA: Diagnosis not present

## 2024-02-04 DIAGNOSIS — Z8669 Personal history of other diseases of the nervous system and sense organs: Secondary | ICD-10-CM | POA: Diagnosis present

## 2024-02-04 DIAGNOSIS — M19042 Primary osteoarthritis, left hand: Secondary | ICD-10-CM

## 2024-02-04 DIAGNOSIS — M19072 Primary osteoarthritis, left ankle and foot: Secondary | ICD-10-CM

## 2024-02-04 DIAGNOSIS — L409 Psoriasis, unspecified: Secondary | ICD-10-CM | POA: Diagnosis not present

## 2024-02-04 MED ORDER — LIDOCAINE HCL 1 % IJ SOLN
0.5000 mL | INTRAMUSCULAR | Status: AC | PRN
  Administered 2024-02-04: .5 mL

## 2024-02-04 MED ORDER — TRIAMCINOLONE ACETONIDE 40 MG/ML IJ SUSP
20.0000 mg | INTRAMUSCULAR | Status: AC | PRN
  Administered 2024-02-04: 20 mg via INTRA_ARTICULAR

## 2024-02-05 ENCOUNTER — Telehealth: Payer: Self-pay | Admitting: Pharmacist

## 2024-02-05 ENCOUNTER — Telehealth: Payer: Self-pay

## 2024-02-05 ENCOUNTER — Ambulatory Visit: Payer: Self-pay | Admitting: Physician Assistant

## 2024-02-05 LAB — CBC WITH DIFFERENTIAL/PLATELET
Absolute Lymphocytes: 936 {cells}/uL (ref 850–3900)
Absolute Monocytes: 552 {cells}/uL (ref 200–950)
Basophils Absolute: 40 {cells}/uL (ref 0–200)
Basophils Relative: 0.5 %
Eosinophils Absolute: 248 {cells}/uL (ref 15–500)
Eosinophils Relative: 3.1 %
HCT: 29 % — ABNORMAL LOW (ref 38.5–50.0)
Hemoglobin: 9.6 g/dL — ABNORMAL LOW (ref 13.2–17.1)
MCH: 30.5 pg (ref 27.0–33.0)
MCHC: 33.1 g/dL (ref 32.0–36.0)
MCV: 92.1 fL (ref 80.0–100.0)
MPV: 11.3 fL (ref 7.5–12.5)
Monocytes Relative: 6.9 %
Neutro Abs: 6224 {cells}/uL (ref 1500–7800)
Neutrophils Relative %: 77.8 %
Platelets: 223 Thousand/uL (ref 140–400)
RBC: 3.15 Million/uL — ABNORMAL LOW (ref 4.20–5.80)
RDW: 13 % (ref 11.0–15.0)
Total Lymphocyte: 11.7 %
WBC: 8 Thousand/uL (ref 3.8–10.8)

## 2024-02-05 LAB — COMPREHENSIVE METABOLIC PANEL WITH GFR
AG Ratio: 1.1 (calc) (ref 1.0–2.5)
ALT: 27 U/L (ref 9–46)
AST: 17 U/L (ref 10–35)
Albumin: 3.6 g/dL (ref 3.6–5.1)
Alkaline phosphatase (APISO): 169 U/L — ABNORMAL HIGH (ref 35–144)
BUN/Creatinine Ratio: 20 (calc) (ref 6–22)
BUN: 37 mg/dL — ABNORMAL HIGH (ref 7–25)
CO2: 19 mmol/L — ABNORMAL LOW (ref 20–32)
Calcium: 8.8 mg/dL (ref 8.6–10.3)
Chloride: 111 mmol/L — ABNORMAL HIGH (ref 98–110)
Creat: 1.86 mg/dL — ABNORMAL HIGH (ref 0.70–1.28)
Globulin: 3.4 g/dL (ref 1.9–3.7)
Glucose, Bld: 147 mg/dL — ABNORMAL HIGH (ref 65–99)
Potassium: 4.4 mmol/L (ref 3.5–5.3)
Sodium: 141 mmol/L (ref 135–146)
Total Bilirubin: 0.5 mg/dL (ref 0.2–1.2)
Total Protein: 7 g/dL (ref 6.1–8.1)
eGFR: 37 mL/min/1.73m2 — ABNORMAL LOW (ref >=60)

## 2024-02-05 NOTE — Progress Notes (Signed)
 Glucose is 147.  Creatinine is elevated-1.86 and GFR is low-37.  Avoid the use of NSAIDs. Forward results to nephrology.   Alk phos remains elevated but stable.

## 2024-02-05 NOTE — Telephone Encounter (Signed)
 Patient was seen in office yesterday. Patient was stating he needs a refill of Taltz . After reviewing the chart, advised the patient he should still have medication because a three month supply was sent in at the end of September. Contacted [facility] and spoke to Roy and she states they have a prescription from October that they were sending out now. [name] states there was no tracking number on the medication. [name] states she is making the medication priority level. [name] advised us  to contact the patient to let him know to contact the pharmacy to overnight his medication. [name] said the call back number is [phone]. Attempted to contact the patient and left a message to call the office back.

## 2024-02-05 NOTE — Progress Notes (Signed)
 Patient remains anemic--hemoglobin continues to gradually trend down-recommend further evaluation by PCP

## 2024-02-05 NOTE — Telephone Encounter (Signed)
 Patient signed patient forms of [employer] PAP at [facility] on 02/04/2024  Prescriber form placed in Waddell Craze, PA-C's folder for signature  Submitted a Prior Authorization RENEWAL request to [employer] for TALTZ  via CoverMyMeds. Will update once we receive a response.  Key: [id number]    Sherry Pennant, PharmD, MPH, BCPS, CPP Clinical Pharmacist [facility])

## 2024-02-05 NOTE — Telephone Encounter (Signed)
 Patient returned call to the office. Patient advised [name] spoke with [facility] and advised they are getting ready to ship the prescription from October. Patient advised he needs to contact [facility] to let them know if he would like to have his medication sent over night. Offered to provide phone number and patient states he has the number on hand.

## 2024-02-07 NOTE — Telephone Encounter (Signed)
 Submitted Patient Assistance RENEWAL Application to [employer] for TALTZ  with patient portion, provider portion, insurance card copy, prior authorization approval, and current medication list. Will update patient when we receive a response.  Phone: [phone] Fax: [phone]

## 2024-02-11 DIAGNOSIS — M1612 Unilateral primary osteoarthritis, left hip: Secondary | ICD-10-CM | POA: Diagnosis not present

## 2024-02-17 NOTE — Telephone Encounter (Signed)
 Received a fax regarding Prior Authorization from [employer] for TALTZ . Authorization has been DENIED because patient must try and fail [employer].  Case #: [id number]  Submitted an URGENT appeal to [facility] for TALTZ .  Appeal Fax: [phone]  Appeal Phone: [phone]

## 2024-02-18 ENCOUNTER — Ambulatory Visit (INDEPENDENT_AMBULATORY_CARE_PROVIDER_SITE_OTHER): Admitting: *Deleted

## 2024-02-18 DIAGNOSIS — E538 Deficiency of other specified B group vitamins: Secondary | ICD-10-CM

## 2024-02-18 MED ORDER — CYANOCOBALAMIN 1000 MCG/ML IJ SOLN
1000.0000 ug | Freq: Once | INTRAMUSCULAR | Status: AC
  Administered 2024-02-18: 1000 ug via INTRAMUSCULAR

## 2024-02-18 NOTE — Progress Notes (Signed)
 Per orders of Arlyss Solian, MD injection of Vitamin B12 given by Manuelita JAYSON Frost in left. Patient tolerated injection well.

## 2024-02-19 DIAGNOSIS — M25552 Pain in left hip: Secondary | ICD-10-CM | POA: Diagnosis not present

## 2024-02-25 NOTE — Telephone Encounter (Signed)
 Spoke with [employer], patient has been approved for patient assistance through 04/01/2025. [employer] will send approval letter fax, copy should've also been mailed to patient. Will scan into chart once received.

## 2024-02-26 DIAGNOSIS — M25552 Pain in left hip: Secondary | ICD-10-CM | POA: Diagnosis not present

## 2024-03-01 ENCOUNTER — Telehealth: Payer: Self-pay | Admitting: Family Medicine

## 2024-03-01 DIAGNOSIS — R7989 Other specified abnormal findings of blood chemistry: Secondary | ICD-10-CM

## 2024-03-01 NOTE — Telephone Encounter (Signed)
 Please check with patient.  His creatinine was elevated on his last check.  Per Waddell Craze, the results are being forwarded to nephrology.  When is he seeing nephrology?  Please let me know.  Thanks.

## 2024-03-02 ENCOUNTER — Ambulatory Visit: Admitting: Family Medicine

## 2024-03-02 ENCOUNTER — Ambulatory Visit: Payer: Self-pay

## 2024-03-02 NOTE — Telephone Encounter (Signed)
 Approval letter has been received and has been scanned in to pt's chart. Nothing further required at this time.

## 2024-03-02 NOTE — Telephone Encounter (Signed)
 Left message to return call to office. Ok to [employer] below.

## 2024-03-02 NOTE — Telephone Encounter (Signed)
 Thanks for getting patient scheduled.

## 2024-03-02 NOTE — Telephone Encounter (Signed)
 Copied from CRM #[id number]. Topic: Clinical - Red Word Triage >> Mar 02, 2024 12:21 PM Hadassah PARAS wrote: Red Word that prompted transfer to Nurse Triage: Saturday pt woke up with a bruise upper arm as if it was a blunt force trauma (does not look like its from eloquist/blood thinner). Yesterday while sitting down heard a pop sound around his shoulder  PAS states call was dropped before Triage was able to answer the call. This RN placed a call to patient. No answer-voicemail left for patient to call back to Nurse Triage. Will place in call backs.

## 2024-03-02 NOTE — Telephone Encounter (Signed)
 FYI Only or Action Required?: Action required by provider: request for appointment.  Patient was last seen in primary care on 01/27/2024 by Cleatus Arlyss RAMAN, MD.  Called Nurse Triage reporting Arm Pain.  Symptoms began several days ago.  Interventions attempted: Nothing.  Symptoms are: unchanged.Noticed large softball size bruise to right upper ar, on blood thinner. No injury. Heard a pop in shoulder last night. Moving arm normally. No other symptoms.  Triage Disposition: See PCP When Office is Open (Within 3 Days)  Patient/caregiver understands and will follow disposition?: Yes      Reason for Disposition  [1] MODERATE pain (e.g., interferes with normal activities) AND [2] present > 3 days  Answer Assessment - Initial Assessment Questions 1. ONSET: When did the pain start?     Saturday 2. LOCATION: Where is the pain located?     Right upper arm 3. PAIN: How bad is the pain? (Scale 0-10; or none, mild, moderate, severe)     6 4. WORK OR EXERCISE: Has there been any recent work or exercise that involved this part of the body?     no 5. CAUSE: What do you think is causing the arm pain?     unsure 6. OTHER SYMPTOMS: Do you have any other symptoms? (e.g., neck pain, swelling, rash, fever, numbness, weakness)     Large softball size 7. PREGNANCY: Is there any chance you are pregnant? When was your last menstrual period?     N/a  Protocols used: Arm Pain-A-AH

## 2024-03-03 ENCOUNTER — Ambulatory Visit (INDEPENDENT_AMBULATORY_CARE_PROVIDER_SITE_OTHER)
Admission: RE | Admit: 2024-03-03 | Discharge: 2024-03-03 | Disposition: A | Source: Ambulatory Visit | Attending: Primary Care

## 2024-03-03 ENCOUNTER — Encounter: Payer: Self-pay | Admitting: Primary Care

## 2024-03-03 ENCOUNTER — Ambulatory Visit: Admitting: Primary Care

## 2024-03-03 VITALS — BP 132/72 | HR 74 | Temp 97.9°F | Ht 69.13 in | Wt 266.4 lb

## 2024-03-03 DIAGNOSIS — S4991XA Unspecified injury of right shoulder and upper arm, initial encounter: Secondary | ICD-10-CM | POA: Diagnosis not present

## 2024-03-03 DIAGNOSIS — M25511 Pain in right shoulder: Secondary | ICD-10-CM

## 2024-03-03 DIAGNOSIS — S2231XA Fracture of one rib, right side, initial encounter for closed fracture: Secondary | ICD-10-CM | POA: Diagnosis not present

## 2024-03-03 NOTE — Assessment & Plan Note (Addendum)
 Mildly posttraumatic given HPI.  Bruising secondary to mild trauma and apixaban . Fortunately, his bruising has improved based on pictures and exam, but weight discussed that bruising may take weeks to heal.   Will check plain films of the right shoulder today. X-ray ordered and pending. Consider prednisone  course.  Consider ultrasound for biceps tendon involvement.  Less likely given exam and HPI today.  Remain off NSAIDs.

## 2024-03-03 NOTE — Telephone Encounter (Signed)
 Received fax from [facility]. Denial for Taltz  has been OVERTURNED. Taltz  is approved from 04/02/2024 through 04/01/2025  Auth # [id number] Case # [id number]  Letter sent to media tab  Sherry Pennant, PharmD, MPH, BCPS, CPP Clinical Pharmacist [facility])
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
## 2024-03-03 NOTE — Telephone Encounter (Signed)
 D/w pt, will refer to renal.  I put in the order.  Thanks.

## 2024-03-03 NOTE — Patient Instructions (Signed)
Complete xray(s) prior to leaving today. I will notify you of your results once received.  It was a pleasure meeting you!  

## 2024-03-04 ENCOUNTER — Ambulatory Visit: Payer: Self-pay | Admitting: Primary Care

## 2024-03-04 DIAGNOSIS — M4727 Other spondylosis with radiculopathy, lumbosacral region: Secondary | ICD-10-CM | POA: Diagnosis not present

## 2024-03-04 DIAGNOSIS — M25652 Stiffness of left hip, not elsewhere classified: Secondary | ICD-10-CM | POA: Diagnosis not present

## 2024-03-04 DIAGNOSIS — R531 Weakness: Secondary | ICD-10-CM | POA: Diagnosis not present

## 2024-03-04 DIAGNOSIS — M25552 Pain in left hip: Secondary | ICD-10-CM | POA: Diagnosis not present

## 2024-03-05 ENCOUNTER — Other Ambulatory Visit: Payer: Self-pay | Admitting: Family Medicine

## 2024-03-05 DIAGNOSIS — M12811 Other specific arthropathies, not elsewhere classified, right shoulder: Secondary | ICD-10-CM

## 2024-03-05 DIAGNOSIS — M48062 Spinal stenosis, lumbar region with neurogenic claudication: Secondary | ICD-10-CM | POA: Diagnosis not present

## 2024-03-05 DIAGNOSIS — M75101 Unspecified rotator cuff tear or rupture of right shoulder, not specified as traumatic: Secondary | ICD-10-CM | POA: Diagnosis not present

## 2024-03-05 DIAGNOSIS — M5416 Radiculopathy, lumbar region: Secondary | ICD-10-CM | POA: Diagnosis not present

## 2024-03-05 DIAGNOSIS — M1612 Unilateral primary osteoarthritis, left hip: Secondary | ICD-10-CM | POA: Diagnosis not present

## 2024-03-05 NOTE — Telephone Encounter (Signed)
 Patient returned call and relayed results to the patients. Says he is not interested in Prednisone .

## 2024-03-07 ENCOUNTER — Inpatient Hospital Stay: Admission: RE | Admit: 2024-03-07 | Discharge: 2024-03-07 | Attending: Family Medicine | Admitting: Family Medicine

## 2024-03-07 DIAGNOSIS — M25511 Pain in right shoulder: Secondary | ICD-10-CM | POA: Diagnosis not present

## 2024-03-07 DIAGNOSIS — M12811 Other specific arthropathies, not elsewhere classified, right shoulder: Secondary | ICD-10-CM

## 2024-03-07 DIAGNOSIS — S43491A Other sprain of right shoulder joint, initial encounter: Secondary | ICD-10-CM | POA: Diagnosis not present

## 2024-03-10 ENCOUNTER — Ambulatory Visit

## 2024-03-10 DIAGNOSIS — E538 Deficiency of other specified B group vitamins: Secondary | ICD-10-CM

## 2024-03-10 MED ORDER — CYANOCOBALAMIN 1000 MCG/ML IJ SOLN
1000.0000 ug | Freq: Once | INTRAMUSCULAR | Status: AC
  Administered 2024-03-10: 1000 ug via INTRAMUSCULAR

## 2024-03-10 NOTE — Progress Notes (Signed)
Per orders of Dr. Crawford Givens, injection of vitamin b 12 given by Lewanda Rife in right deltoid. Patient tolerated injection well. Patient will make appointment for 3 week.

## 2024-03-11 DIAGNOSIS — M25552 Pain in left hip: Secondary | ICD-10-CM | POA: Diagnosis not present

## 2024-03-12 DIAGNOSIS — M75101 Unspecified rotator cuff tear or rupture of right shoulder, not specified as traumatic: Secondary | ICD-10-CM | POA: Diagnosis not present

## 2024-03-12 DIAGNOSIS — M12811 Other specific arthropathies, not elsewhere classified, right shoulder: Secondary | ICD-10-CM | POA: Diagnosis not present

## 2024-03-21 ENCOUNTER — Other Ambulatory Visit: Payer: Self-pay | Admitting: Family Medicine

## 2024-03-21 DIAGNOSIS — G629 Polyneuropathy, unspecified: Secondary | ICD-10-CM

## 2024-03-23 NOTE — Telephone Encounter (Signed)
 Name of Medication:  Lyrica  Name of Pharmacy:  [facility] [address] Last Fill or Written Date and Quantity:  12/20/23 Last Office Visit and Type:  01/27/24, 3 mo f/u Next Office Visit and Type:  05/29/24, 4 mo f/u Last Controlled Substance Agreement Date:  none Last UDS:  none

## 2024-04-09 ENCOUNTER — Ambulatory Visit (INDEPENDENT_AMBULATORY_CARE_PROVIDER_SITE_OTHER): Admitting: *Deleted

## 2024-04-09 DIAGNOSIS — E538 Deficiency of other specified B group vitamins: Secondary | ICD-10-CM

## 2024-04-09 MED ORDER — CYANOCOBALAMIN 1000 MCG/ML IJ SOLN
1000.0000 ug | Freq: Once | INTRAMUSCULAR | Status: AC
  Administered 2024-04-09: 1000 ug via INTRAMUSCULAR

## 2024-04-09 NOTE — Progress Notes (Signed)
 Per orders of Dr. Greig Ring (PCP out of the office), injection of B-12 given by Rayleen Forts M in Left deltoid Patient tolerated injection well. Patient will make appointment for 3 week(s)

## 2024-04-29 DIAGNOSIS — Z79899 Other long term (current) drug therapy: Secondary | ICD-10-CM

## 2024-04-30 ENCOUNTER — Ambulatory Visit

## 2024-04-30 DIAGNOSIS — E538 Deficiency of other specified B group vitamins: Secondary | ICD-10-CM

## 2024-04-30 MED ORDER — CYANOCOBALAMIN 1000 MCG/ML IJ SOLN
1000.0000 ug | Freq: Once | INTRAMUSCULAR | Status: AC
  Administered 2024-04-30: 1000 ug via INTRAMUSCULAR

## 2024-04-30 NOTE — Progress Notes (Signed)
Per orders of Dr. Crawford Givens, injection of vitamin b 12 given by Lewanda Rife in right deltoid. Patient tolerated injection well. Patient will make appointment for 3 week.

## 2024-05-01 LAB — CBC WITH DIFFERENTIAL/PLATELET
Basophils Absolute: 0 10*3/uL (ref 0.0–0.2)
Basos: 1 %
EOS (ABSOLUTE): 0.3 10*3/uL (ref 0.0–0.4)
Eos: 5 %
Hematocrit: 32.5 % — ABNORMAL LOW (ref 37.5–51.0)
Hemoglobin: 10.4 g/dL — ABNORMAL LOW (ref 13.0–17.7)
Immature Grans (Abs): 0 10*3/uL (ref 0.0–0.1)
Immature Granulocytes: 0 %
Lymphocytes Absolute: 1.1 10*3/uL (ref 0.7–3.1)
Lymphs: 18 %
MCH: 30.7 pg (ref 26.6–33.0)
MCHC: 32 g/dL (ref 31.5–35.7)
MCV: 96 fL (ref 79–97)
Monocytes Absolute: 0.4 10*3/uL (ref 0.1–0.9)
Monocytes: 7 %
Neutrophils Absolute: 4.2 10*3/uL (ref 1.4–7.0)
Neutrophils: 69 %
Platelets: 224 10*3/uL (ref 150–450)
RBC: 3.39 x10E6/uL — ABNORMAL LOW (ref 4.14–5.80)
RDW: 13.5 % (ref 11.6–15.4)
WBC: 6 10*3/uL (ref 3.4–10.8)

## 2024-05-01 LAB — CMP14+EGFR
ALT: 21 [IU]/L (ref 0–44)
AST: 14 [IU]/L (ref 0–40)
Albumin: 4 g/dL (ref 3.8–4.8)
Alkaline Phosphatase: 210 [IU]/L — ABNORMAL HIGH (ref 47–123)
BUN/Creatinine Ratio: 22 (ref 10–24)
BUN: 42 mg/dL — ABNORMAL HIGH (ref 8–27)
Bilirubin Total: 0.4 mg/dL (ref 0.0–1.2)
CO2: 16 mmol/L — ABNORMAL LOW (ref 20–29)
Calcium: 9.1 mg/dL (ref 8.6–10.2)
Chloride: 110 mmol/L — ABNORMAL HIGH (ref 96–106)
Creatinine, Ser: 1.92 mg/dL — ABNORMAL HIGH (ref 0.76–1.27)
Globulin, Total: 3 g/dL (ref 1.5–4.5)
Glucose: 115 mg/dL — ABNORMAL HIGH (ref 70–99)
Potassium: 5.7 mmol/L — ABNORMAL HIGH (ref 3.5–5.2)
Sodium: 141 mmol/L (ref 134–144)
Total Protein: 7 g/dL (ref 6.0–8.5)
eGFR: 35 mL/min/{1.73_m2} — ABNORMAL LOW

## 2024-05-04 ENCOUNTER — Ambulatory Visit: Payer: Self-pay | Admitting: Rheumatology

## 2024-05-04 DIAGNOSIS — R7989 Other specified abnormal findings of blood chemistry: Secondary | ICD-10-CM

## 2024-05-06 NOTE — Progress Notes (Signed)
 lvm for pt to call office to schedule appt.

## 2024-05-07 ENCOUNTER — Other Ambulatory Visit

## 2024-05-07 ENCOUNTER — Other Ambulatory Visit: Payer: Self-pay | Admitting: Radiology

## 2024-05-07 DIAGNOSIS — E1149 Type 2 diabetes mellitus with other diabetic neurological complication: Secondary | ICD-10-CM

## 2024-05-07 DIAGNOSIS — R7989 Other specified abnormal findings of blood chemistry: Secondary | ICD-10-CM

## 2024-05-07 NOTE — Addendum Note (Signed)
 Addended by: HOPE VEVA PARAS on: 05/07/2024 02:16 PM   Modules accepted: Orders

## 2024-05-08 LAB — BASIC METABOLIC PANEL WITH GFR
BUN/Creatinine Ratio: 27 (calc) — ABNORMAL HIGH (ref 6–22)
BUN: 58 mg/dL — ABNORMAL HIGH (ref 7–25)
CO2: 19 mmol/L — ABNORMAL LOW (ref 20–32)
Calcium: 8.5 mg/dL — ABNORMAL LOW (ref 8.6–10.3)
Chloride: 113 mmol/L — ABNORMAL HIGH (ref 98–110)
Creat: 2.14 mg/dL — ABNORMAL HIGH (ref 0.70–1.28)
Glucose, Bld: 184 mg/dL — ABNORMAL HIGH (ref 65–99)
Potassium: 4.9 mmol/L (ref 3.5–5.3)
Sodium: 141 mmol/L (ref 135–146)
eGFR: 31 mL/min/{1.73_m2} — ABNORMAL LOW

## 2024-05-08 LAB — HEMOGLOBIN A1C
Hgb A1c MFr Bld: 5.9 % — ABNORMAL HIGH
Mean Plasma Glucose: 123 mg/dL
eAG (mmol/L): 6.8 mmol/L

## 2024-05-21 ENCOUNTER — Ambulatory Visit

## 2024-05-29 ENCOUNTER — Ambulatory Visit: Admitting: Family Medicine

## 2024-07-20 ENCOUNTER — Ambulatory Visit: Admitting: Psychiatry

## 2024-07-30 ENCOUNTER — Ambulatory Visit: Admitting: Rheumatology

## 2024-08-06 ENCOUNTER — Ambulatory Visit

## 2024-08-07 ENCOUNTER — Ambulatory Visit
